# Patient Record
Sex: Female | Born: 1945 | Race: Black or African American | Hispanic: No | Marital: Married | Smoking: Never smoker
Health system: Southern US, Community
[De-identification: ages and names within clinical notes are randomized; demographics above are authoritative.]

## PROBLEM LIST (undated history)

## (undated) DIAGNOSIS — I10 Essential (primary) hypertension: Secondary | ICD-10-CM

## (undated) DIAGNOSIS — D649 Anemia, unspecified: Secondary | ICD-10-CM

## (undated) DIAGNOSIS — E669 Obesity, unspecified: Secondary | ICD-10-CM

## (undated) DIAGNOSIS — E119 Type 2 diabetes mellitus without complications: Secondary | ICD-10-CM

## (undated) DIAGNOSIS — M1990 Unspecified osteoarthritis, unspecified site: Secondary | ICD-10-CM

## (undated) DIAGNOSIS — E785 Hyperlipidemia, unspecified: Secondary | ICD-10-CM

## (undated) HISTORY — DX: Unspecified osteoarthritis, unspecified site: M19.90

## (undated) HISTORY — DX: Obesity, unspecified: E66.9

## (undated) HISTORY — DX: Essential (primary) hypertension: I10

## (undated) HISTORY — DX: Type 2 diabetes mellitus without complications: E11.9

## (undated) HISTORY — DX: Hyperlipidemia, unspecified: E78.5

## (undated) HISTORY — PX: ABDOMINAL HYSTERECTOMY: SHX81

---

## 1983-03-13 HISTORY — PX: ABDOMINAL HYSTERECTOMY: SHX81

## 2003-01-13 ENCOUNTER — Encounter: Admission: RE | Admit: 2003-01-13 | Discharge: 2003-01-13 | Payer: Self-pay | Admitting: Internal Medicine

## 2003-12-28 ENCOUNTER — Ambulatory Visit: Payer: Self-pay | Admitting: Internal Medicine

## 2005-02-28 ENCOUNTER — Ambulatory Visit: Payer: Self-pay | Admitting: Internal Medicine

## 2005-05-10 ENCOUNTER — Ambulatory Visit: Payer: Self-pay | Admitting: Internal Medicine

## 2005-08-30 ENCOUNTER — Ambulatory Visit: Payer: Self-pay | Admitting: Internal Medicine

## 2005-09-05 ENCOUNTER — Ambulatory Visit: Payer: Self-pay | Admitting: Internal Medicine

## 2005-12-17 ENCOUNTER — Ambulatory Visit: Payer: Self-pay | Admitting: Internal Medicine

## 2006-04-01 ENCOUNTER — Ambulatory Visit: Payer: Self-pay | Admitting: Internal Medicine

## 2006-04-01 LAB — CONVERTED CEMR LAB
ALT: 18 units/L (ref 0–40)
AST: 12 units/L (ref 0–37)
Cholesterol: 194 mg/dL (ref 0–200)
Creatinine, Ser: 0.7 mg/dL (ref 0.4–1.2)
HDL: 47.8 mg/dL (ref >39.0)
Hgb A1c MFr Bld: 9.4 % — ABNORMAL HIGH (ref 4.6–6.0)
LDL Cholesterol: 134 mg/dL — ABNORMAL HIGH (ref 0–99)
Potassium: 4.2 meq/L (ref 3.5–5.1)
Total CHOL/HDL Ratio: 4.1
Triglycerides: 60 mg/dL (ref 0–149)
VLDL: 12 mg/dL (ref 0–40)

## 2006-04-17 ENCOUNTER — Other Ambulatory Visit: Admission: RE | Admit: 2006-04-17 | Discharge: 2006-04-17 | Payer: Self-pay | Admitting: Gynecology

## 2006-05-10 ENCOUNTER — Ambulatory Visit: Payer: Self-pay | Admitting: Internal Medicine

## 2006-07-25 ENCOUNTER — Ambulatory Visit: Payer: Self-pay | Admitting: Gastroenterology

## 2006-08-07 ENCOUNTER — Ambulatory Visit: Payer: Self-pay | Admitting: Internal Medicine

## 2006-08-07 ENCOUNTER — Ambulatory Visit: Payer: Self-pay | Admitting: Gastroenterology

## 2006-08-07 LAB — CONVERTED CEMR LAB
ALT: 18 units/L (ref 0–40)
AST: 14 units/L (ref 0–37)
Albumin: 3.7 g/dL (ref 3.5–5.2)
Alkaline Phosphatase: 82 units/L (ref 39–117)
BUN: 10 mg/dL (ref 6–23)
Bilirubin, Direct: 0.1 mg/dL (ref 0.0–0.3)
CO2: 28 meq/L (ref 19–32)
Calcium: 9.3 mg/dL (ref 8.4–10.5)
Chloride: 103 meq/L (ref 96–112)
Cholesterol: 235 mg/dL (ref 0–200)
Creatinine, Ser: 0.7 mg/dL (ref 0.4–1.2)
Direct LDL: 162.1 mg/dL
GFR calc Af Amer: 110 mL/min
GFR calc non Af Amer: 91 mL/min
Glucose, Bld: 142 mg/dL — ABNORMAL HIGH (ref 70–99)
HDL: 46.4 mg/dL (ref >39.0)
Hgb A1c MFr Bld: 10.5 % — ABNORMAL HIGH (ref 4.6–6.0)
Potassium: 3.9 meq/L (ref 3.5–5.1)
Sodium: 139 meq/L (ref 135–145)
Total Bilirubin: 0.8 mg/dL (ref 0.3–1.2)
Total CHOL/HDL Ratio: 5.1
Total Protein: 7.5 g/dL (ref 6.0–8.3)
Triglycerides: 78 mg/dL (ref 0–149)
VLDL: 16 mg/dL (ref 0–40)
Vit D, 1,25-Dihydroxy: 10 — ABNORMAL LOW (ref 20–57)

## 2006-08-07 LAB — HM COLONOSCOPY: HM Colonoscopy: NORMAL

## 2006-08-09 ENCOUNTER — Ambulatory Visit: Payer: Self-pay | Admitting: Internal Medicine

## 2006-11-26 DIAGNOSIS — I10 Essential (primary) hypertension: Secondary | ICD-10-CM | POA: Insufficient documentation

## 2006-11-26 DIAGNOSIS — E785 Hyperlipidemia, unspecified: Secondary | ICD-10-CM

## 2006-11-26 DIAGNOSIS — E1169 Type 2 diabetes mellitus with other specified complication: Secondary | ICD-10-CM | POA: Insufficient documentation

## 2006-12-10 ENCOUNTER — Ambulatory Visit: Payer: Self-pay | Admitting: Internal Medicine

## 2006-12-10 LAB — CONVERTED CEMR LAB
ALT: 18 units/L (ref 0–35)
AST: 11 units/L (ref 0–37)
Albumin: 3.4 g/dL — ABNORMAL LOW (ref 3.5–5.2)
Alkaline Phosphatase: 80 units/L (ref 39–117)
BUN: 10 mg/dL (ref 6–23)
Bilirubin, Direct: 0.1 mg/dL (ref 0.0–0.3)
CO2: 29 meq/L (ref 19–32)
Calcium: 9.3 mg/dL (ref 8.4–10.5)
Chloride: 105 meq/L (ref 96–112)
Cholesterol: 202 mg/dL (ref 0–200)
Creatinine, Ser: 0.6 mg/dL (ref 0.4–1.2)
Direct LDL: 147.6 mg/dL
GFR calc Af Amer: 131 mL/min
GFR calc non Af Amer: 108 mL/min
Glucose, Bld: 177 mg/dL — ABNORMAL HIGH (ref 70–99)
HDL: 43 mg/dL (ref >39.0)
Hgb A1c MFr Bld: 8.9 % — ABNORMAL HIGH (ref 4.6–6.0)
Potassium: 4.4 meq/L (ref 3.5–5.1)
Sodium: 141 meq/L (ref 135–145)
Total Bilirubin: 0.6 mg/dL (ref 0.3–1.2)
Total CHOL/HDL Ratio: 4.7
Total Protein: 7 g/dL (ref 6.0–8.3)
Triglycerides: 81 mg/dL (ref 0–149)
VLDL: 16 mg/dL (ref 0–40)
Vit D, 1,25-Dihydroxy: 17 — ABNORMAL LOW (ref 20–57)

## 2006-12-12 ENCOUNTER — Ambulatory Visit: Payer: Self-pay | Admitting: Internal Medicine

## 2006-12-19 ENCOUNTER — Encounter: Payer: Self-pay | Admitting: Internal Medicine

## 2007-01-14 ENCOUNTER — Telehealth: Payer: Self-pay | Admitting: Internal Medicine

## 2007-01-14 ENCOUNTER — Ambulatory Visit: Payer: Self-pay | Admitting: Internal Medicine

## 2007-04-09 ENCOUNTER — Ambulatory Visit: Payer: Self-pay | Admitting: Internal Medicine

## 2007-04-09 LAB — CONVERTED CEMR LAB
ALT: 18 units/L (ref 0–35)
AST: 12 units/L (ref 0–37)
Albumin: 3.6 g/dL (ref 3.5–5.2)
Alkaline Phosphatase: 73 units/L (ref 39–117)
BUN: 9 mg/dL (ref 6–23)
Bilirubin, Direct: 0.1 mg/dL (ref 0.0–0.3)
CO2: 29 meq/L (ref 19–32)
Calcium: 9.5 mg/dL (ref 8.4–10.5)
Chloride: 106 meq/L (ref 96–112)
Cholesterol: 207 mg/dL (ref 0–200)
Creatinine, Ser: 0.7 mg/dL (ref 0.4–1.2)
Direct LDL: 135.3 mg/dL
GFR calc Af Amer: 109 mL/min
GFR calc non Af Amer: 90 mL/min
Glucose, Bld: 159 mg/dL — ABNORMAL HIGH (ref 70–99)
HDL: 44.8 mg/dL (ref >39.0)
Hgb A1c MFr Bld: 9.8 % — ABNORMAL HIGH (ref 4.6–6.0)
Potassium: 4.6 meq/L (ref 3.5–5.1)
Sodium: 141 meq/L (ref 135–145)
Total Bilirubin: 0.6 mg/dL (ref 0.3–1.2)
Total CHOL/HDL Ratio: 4.6
Total Protein: 6.9 g/dL (ref 6.0–8.3)
Triglycerides: 84 mg/dL (ref 0–149)
VLDL: 17 mg/dL (ref 0–40)

## 2007-04-11 ENCOUNTER — Ambulatory Visit: Payer: Self-pay | Admitting: Internal Medicine

## 2007-04-11 DIAGNOSIS — E669 Obesity, unspecified: Secondary | ICD-10-CM | POA: Insufficient documentation

## 2007-04-11 DIAGNOSIS — E113599 Type 2 diabetes mellitus with proliferative diabetic retinopathy without macular edema, unspecified eye: Secondary | ICD-10-CM | POA: Insufficient documentation

## 2007-04-21 ENCOUNTER — Other Ambulatory Visit: Admission: RE | Admit: 2007-04-21 | Discharge: 2007-04-21 | Payer: Self-pay | Admitting: Gynecology

## 2007-06-18 ENCOUNTER — Encounter: Payer: Self-pay | Admitting: Internal Medicine

## 2007-09-01 ENCOUNTER — Ambulatory Visit: Payer: Self-pay | Admitting: Internal Medicine

## 2007-09-02 LAB — CONVERTED CEMR LAB
BUN: 12 mg/dL (ref 6–23)
CO2: 28 meq/L (ref 19–32)
Calcium: 8.9 mg/dL (ref 8.4–10.5)
Chloride: 105 meq/L (ref 96–112)
Cholesterol: 204 mg/dL (ref 0–200)
Creatinine, Ser: 0.7 mg/dL (ref 0.4–1.2)
Direct LDL: 152 mg/dL
GFR calc Af Amer: 109 mL/min
GFR calc non Af Amer: 90 mL/min
Glucose, Bld: 128 mg/dL — ABNORMAL HIGH (ref 70–99)
HDL: 44.3 mg/dL (ref >39.0)
Hgb A1c MFr Bld: 7.6 % — ABNORMAL HIGH (ref 4.6–6.0)
Potassium: 4.3 meq/L (ref 3.5–5.1)
Sodium: 141 meq/L (ref 135–145)
TSH: 3.08 microintl units/mL (ref 0.35–5.50)
Total CHOL/HDL Ratio: 4.6
Triglycerides: 52 mg/dL (ref 0–149)
VLDL: 10 mg/dL (ref 0–40)

## 2007-09-03 ENCOUNTER — Ambulatory Visit: Payer: Self-pay | Admitting: Internal Medicine

## 2007-09-03 DIAGNOSIS — R252 Cramp and spasm: Secondary | ICD-10-CM | POA: Insufficient documentation

## 2008-02-26 ENCOUNTER — Encounter: Payer: Self-pay | Admitting: Internal Medicine

## 2008-02-26 ENCOUNTER — Ambulatory Visit: Payer: Self-pay | Admitting: Internal Medicine

## 2008-03-02 LAB — CONVERTED CEMR LAB
ALT: 15 units/L (ref 0–35)
AST: 12 units/L (ref 0–37)
Albumin: 3.5 g/dL (ref 3.5–5.2)
Alkaline Phosphatase: 74 units/L (ref 39–117)
BUN: 8 mg/dL (ref 6–23)
Bilirubin, Direct: 0.1 mg/dL (ref 0.0–0.3)
CO2: 30 meq/L (ref 19–32)
Calcium: 9.2 mg/dL (ref 8.4–10.5)
Chloride: 108 meq/L (ref 96–112)
Creatinine, Ser: 0.6 mg/dL (ref 0.4–1.2)
GFR calc Af Amer: 131 mL/min
GFR calc non Af Amer: 108 mL/min
Glucose, Bld: 116 mg/dL — ABNORMAL HIGH (ref 70–99)
Hgb A1c MFr Bld: 7.7 % — ABNORMAL HIGH (ref 4.6–6.0)
Potassium: 4.9 meq/L (ref 3.5–5.1)
Sodium: 142 meq/L (ref 135–145)
Total Bilirubin: 0.5 mg/dL (ref 0.3–1.2)
Total Protein: 7.2 g/dL (ref 6.0–8.3)
Vit D, 1,25-Dihydroxy: 13 — ABNORMAL LOW (ref 30–89)

## 2008-03-04 ENCOUNTER — Ambulatory Visit: Payer: Self-pay | Admitting: Internal Medicine

## 2008-03-04 DIAGNOSIS — M25569 Pain in unspecified knee: Secondary | ICD-10-CM | POA: Insufficient documentation

## 2008-04-21 ENCOUNTER — Ambulatory Visit: Payer: Self-pay | Admitting: Gynecology

## 2008-04-21 ENCOUNTER — Other Ambulatory Visit: Admission: RE | Admit: 2008-04-21 | Discharge: 2008-04-21 | Payer: Self-pay | Admitting: Gynecology

## 2008-04-21 ENCOUNTER — Encounter: Payer: Self-pay | Admitting: Gynecology

## 2008-05-03 ENCOUNTER — Ambulatory Visit: Payer: Self-pay | Admitting: Gynecology

## 2008-06-17 ENCOUNTER — Ambulatory Visit: Payer: Self-pay | Admitting: Internal Medicine

## 2008-06-17 LAB — CONVERTED CEMR LAB
BUN: 12 mg/dL (ref 6–23)
CO2: 29 meq/L (ref 19–32)
Calcium: 9 mg/dL (ref 8.4–10.5)
Chloride: 109 meq/L (ref 96–112)
Creatinine, Ser: 0.6 mg/dL (ref 0.4–1.2)
GFR calc non Af Amer: 130.16 mL/min (ref >60)
Glucose, Bld: 117 mg/dL — ABNORMAL HIGH (ref 70–99)
Hgb A1c MFr Bld: 7.8 % — ABNORMAL HIGH (ref 4.6–6.5)
Potassium: 4.4 meq/L (ref 3.5–5.1)
Sodium: 143 meq/L (ref 135–145)

## 2008-06-21 ENCOUNTER — Ambulatory Visit: Payer: Self-pay | Admitting: Internal Medicine

## 2008-10-12 ENCOUNTER — Ambulatory Visit: Payer: Self-pay | Admitting: Internal Medicine

## 2008-10-12 LAB — CONVERTED CEMR LAB
ALT: 14 units/L (ref 0–35)
AST: 11 units/L (ref 0–37)
Albumin: 3.5 g/dL (ref 3.5–5.2)
Alkaline Phosphatase: 81 units/L (ref 39–117)
BUN: 15 mg/dL (ref 6–23)
Bilirubin, Direct: 0.1 mg/dL (ref 0.0–0.3)
CO2: 29 meq/L (ref 19–32)
Calcium: 8.9 mg/dL (ref 8.4–10.5)
Chloride: 109 meq/L (ref 96–112)
Cholesterol: 199 mg/dL (ref 0–200)
Creatinine, Ser: 0.8 mg/dL (ref 0.4–1.2)
GFR calc non Af Amer: 93.3 mL/min (ref >60)
Glucose, Bld: 148 mg/dL — ABNORMAL HIGH (ref 70–99)
HDL: 40.6 mg/dL (ref >39.00)
Hgb A1c MFr Bld: 8.7 % — ABNORMAL HIGH (ref 4.6–6.5)
LDL Cholesterol: 146 mg/dL — ABNORMAL HIGH (ref 0–99)
Potassium: 4.4 meq/L (ref 3.5–5.1)
Sodium: 142 meq/L (ref 135–145)
Total Bilirubin: 0.5 mg/dL (ref 0.3–1.2)
Total CHOL/HDL Ratio: 5
Total Protein: 7.2 g/dL (ref 6.0–8.3)
Triglycerides: 61 mg/dL (ref 0.0–149.0)
VLDL: 12.2 mg/dL (ref 0.0–40.0)

## 2008-10-20 ENCOUNTER — Ambulatory Visit: Payer: Self-pay | Admitting: Internal Medicine

## 2008-11-09 ENCOUNTER — Encounter: Payer: Self-pay | Admitting: Internal Medicine

## 2009-02-11 ENCOUNTER — Ambulatory Visit: Payer: Self-pay | Admitting: Internal Medicine

## 2009-02-15 LAB — CONVERTED CEMR LAB
BUN: 10 mg/dL (ref 6–23)
CO2: 28 meq/L (ref 19–32)
Calcium: 8.9 mg/dL (ref 8.4–10.5)
Chloride: 106 meq/L (ref 96–112)
Creatinine, Ser: 0.8 mg/dL (ref 0.4–1.2)
GFR calc non Af Amer: 93.19 mL/min (ref >60)
Glucose, Bld: 166 mg/dL — ABNORMAL HIGH (ref 70–99)
Hgb A1c MFr Bld: 8.8 % — ABNORMAL HIGH (ref 4.6–6.5)
Potassium: 4.8 meq/L (ref 3.5–5.1)
Sodium: 140 meq/L (ref 135–145)

## 2009-02-17 ENCOUNTER — Ambulatory Visit: Payer: Self-pay | Admitting: Internal Medicine

## 2009-03-10 ENCOUNTER — Encounter: Payer: Self-pay | Admitting: Internal Medicine

## 2009-04-22 ENCOUNTER — Ambulatory Visit: Payer: Self-pay | Admitting: Gynecology

## 2009-04-22 ENCOUNTER — Other Ambulatory Visit: Admission: RE | Admit: 2009-04-22 | Discharge: 2009-04-22 | Payer: Self-pay | Admitting: Gynecology

## 2009-05-26 ENCOUNTER — Telehealth: Payer: Self-pay | Admitting: Internal Medicine

## 2009-05-31 ENCOUNTER — Ambulatory Visit: Payer: Self-pay | Admitting: Internal Medicine

## 2009-05-31 LAB — CONVERTED CEMR LAB
ALT: 16 units/L (ref 0–35)
AST: 12 units/L (ref 0–37)
Albumin: 3.5 g/dL (ref 3.5–5.2)
Alkaline Phosphatase: 59 units/L (ref 39–117)
BUN: 14 mg/dL (ref 6–23)
Bilirubin, Direct: 0 mg/dL (ref 0.0–0.3)
CO2: 25 meq/L (ref 19–32)
Calcium: 9 mg/dL (ref 8.4–10.5)
Chloride: 108 meq/L (ref 96–112)
Creatinine, Ser: 0.8 mg/dL (ref 0.4–1.2)
GFR calc non Af Amer: 93.1 mL/min (ref >60)
Glucose, Bld: 66 mg/dL — ABNORMAL LOW (ref 70–99)
Hgb A1c MFr Bld: 7.3 % — ABNORMAL HIGH (ref 4.6–6.5)
Potassium: 4.3 meq/L (ref 3.5–5.1)
Sodium: 140 meq/L (ref 135–145)
Total Bilirubin: 0.4 mg/dL (ref 0.3–1.2)
Total Protein: 7.1 g/dL (ref 6.0–8.3)

## 2009-06-07 ENCOUNTER — Ambulatory Visit: Payer: Self-pay | Admitting: Internal Medicine

## 2009-07-05 ENCOUNTER — Telehealth: Payer: Self-pay | Admitting: Internal Medicine

## 2009-08-09 ENCOUNTER — Telehealth: Payer: Self-pay | Admitting: Internal Medicine

## 2009-08-11 ENCOUNTER — Telehealth: Payer: Self-pay | Admitting: Internal Medicine

## 2009-09-30 ENCOUNTER — Ambulatory Visit: Payer: Self-pay | Admitting: Internal Medicine

## 2009-09-30 LAB — CONVERTED CEMR LAB
BUN: 21 mg/dL (ref 6–23)
CO2: 26 meq/L (ref 19–32)
Calcium: 9.2 mg/dL (ref 8.4–10.5)
Chloride: 110 meq/L (ref 96–112)
Creatinine, Ser: 0.9 mg/dL (ref 0.4–1.2)
GFR calc non Af Amer: 81.19 mL/min (ref >60)
Glucose, Bld: 90 mg/dL (ref 70–99)
Hgb A1c MFr Bld: 6.8 % — ABNORMAL HIGH (ref 4.6–6.5)
Potassium: 5 meq/L (ref 3.5–5.1)
Sodium: 142 meq/L (ref 135–145)

## 2009-10-07 ENCOUNTER — Ambulatory Visit: Payer: Self-pay | Admitting: Internal Medicine

## 2010-01-30 ENCOUNTER — Ambulatory Visit: Payer: Self-pay | Admitting: Internal Medicine

## 2010-03-01 ENCOUNTER — Ambulatory Visit: Payer: Self-pay | Admitting: Internal Medicine

## 2010-03-01 LAB — CONVERTED CEMR LAB
Bilirubin Urine: NEGATIVE
Blood in Urine, dipstick: NEGATIVE
Glucose, Urine, Semiquant: NEGATIVE
Ketones, urine, test strip: NEGATIVE
Nitrite: NEGATIVE
Protein, U semiquant: NEGATIVE
Specific Gravity, Urine: 1.02
Urobilinogen, UA: 0.2
WBC Urine, dipstick: NEGATIVE
pH: 7

## 2010-03-09 ENCOUNTER — Ambulatory Visit
Admission: RE | Admit: 2010-03-09 | Discharge: 2010-03-09 | Payer: Self-pay | Source: Home / Self Care | Attending: Internal Medicine | Admitting: Internal Medicine

## 2010-03-09 ENCOUNTER — Encounter: Payer: Self-pay | Admitting: Internal Medicine

## 2010-03-09 DIAGNOSIS — R143 Flatulence: Secondary | ICD-10-CM

## 2010-03-09 DIAGNOSIS — R142 Eructation: Secondary | ICD-10-CM

## 2010-03-09 DIAGNOSIS — R141 Gas pain: Secondary | ICD-10-CM | POA: Insufficient documentation

## 2010-03-10 LAB — CONVERTED CEMR LAB
ALT: 18 units/L (ref 0–35)
AST: 12 units/L (ref 0–37)
Albumin: 3.7 g/dL (ref 3.5–5.2)
Alkaline Phosphatase: 87 units/L (ref 39–117)
BUN: 18 mg/dL (ref 6–23)
Basophils Absolute: 0 10*3/uL (ref 0.0–0.1)
Basophils Relative: 0.4 % (ref 0.0–3.0)
Bilirubin, Direct: 0.1 mg/dL (ref 0.0–0.3)
CO2: 27 meq/L (ref 19–32)
Calcium: 9.3 mg/dL (ref 8.4–10.5)
Chloride: 108 meq/L (ref 96–112)
Cholesterol: 225 mg/dL — ABNORMAL HIGH (ref 0–200)
Creatinine, Ser: 0.9 mg/dL (ref 0.4–1.2)
Direct LDL: 165.6 mg/dL
Eosinophils Absolute: 0 10*3/uL (ref 0.0–0.7)
Eosinophils Relative: 0.8 % (ref 0.0–5.0)
GFR calc non Af Amer: 82.13 mL/min (ref >60.00)
Glucose, Bld: 106 mg/dL — ABNORMAL HIGH (ref 70–99)
HCT: 33.1 % — ABNORMAL LOW (ref 36.0–46.0)
HDL: 52.9 mg/dL (ref >39.00)
Hemoglobin: 11.1 g/dL — ABNORMAL LOW (ref 12.0–15.0)
Hgb A1c MFr Bld: 7 % — ABNORMAL HIGH (ref 4.6–6.5)
Lymphocytes Relative: 25.9 % (ref 12.0–46.0)
Lymphs Abs: 1.6 10*3/uL (ref 0.7–4.0)
MCHC: 33.7 g/dL (ref 30.0–36.0)
MCV: 90.8 fL (ref 78.0–100.0)
Monocytes Absolute: 0.4 10*3/uL (ref 0.1–1.0)
Monocytes Relative: 6 % (ref 3.0–12.0)
Neutro Abs: 4.1 10*3/uL (ref 1.4–7.7)
Neutrophils Relative %: 66.9 % (ref 43.0–77.0)
Platelets: 244 10*3/uL (ref 150.0–400.0)
Potassium: 5.3 meq/L — ABNORMAL HIGH (ref 3.5–5.1)
RBC: 3.64 M/uL — ABNORMAL LOW (ref 3.87–5.11)
RDW: 13.8 % (ref 11.5–14.6)
Sodium: 142 meq/L (ref 135–145)
TSH: 2.31 microintl units/mL (ref 0.35–5.50)
Total Bilirubin: 0.4 mg/dL (ref 0.3–1.2)
Total CHOL/HDL Ratio: 4
Total Protein: 7.3 g/dL (ref 6.0–8.3)
Triglycerides: 37 mg/dL (ref 0.0–149.0)
VLDL: 7.4 mg/dL (ref 0.0–40.0)
WBC: 6.2 10*3/uL (ref 4.5–10.5)

## 2010-03-14 ENCOUNTER — Telehealth: Payer: Self-pay | Admitting: Internal Medicine

## 2010-03-14 ENCOUNTER — Encounter: Payer: Self-pay | Admitting: Internal Medicine

## 2010-03-28 ENCOUNTER — Encounter
Admission: RE | Admit: 2010-03-28 | Discharge: 2010-03-28 | Payer: Self-pay | Source: Home / Self Care | Attending: Internal Medicine | Admitting: Internal Medicine

## 2010-03-28 ENCOUNTER — Telehealth: Payer: Self-pay | Admitting: Internal Medicine

## 2010-03-29 ENCOUNTER — Telehealth: Payer: Self-pay | Admitting: Internal Medicine

## 2010-03-31 ENCOUNTER — Encounter: Payer: Self-pay | Admitting: Internal Medicine

## 2010-04-01 ENCOUNTER — Encounter: Payer: Self-pay | Admitting: Internal Medicine

## 2010-04-11 NOTE — Progress Notes (Signed)
Summary: Capsules  Phone Note From Pharmacy   Summary of Call: Pharm requested capsules in place of tablets for pt's verapamil. Gave verbal ok to pharm and updated EMR.  Initial call taken by: Lamar Sprinkles, CMA,  August 11, 2009 1:44 PM  Follow-up for Phone Call        agree - Thx Follow-up by: Tresa Garter MD,  August 11, 2009 10:46 PM    New/Updated Medications: VERAPAMIL HCL CR 240 MG XR24H-CAP (VERAPAMIL HCL) once daily Prescriptions: VERAPAMIL HCL CR 240 MG XR24H-CAP (VERAPAMIL HCL) once daily  #90 x 2   Entered by:   Lamar Sprinkles, CMA   Authorized by:   Tresa Garter MD   Signed by:   Lamar Sprinkles, CMA on 08/11/2009   Method used:   Telephoned to ...       [facility]* (retail)       [address]       [address] Shipping/mailing       [place], [place]  [address]       Ph: [phone]       Fax: [phone]   RxID:   [id number]

## 2010-04-11 NOTE — Assessment & Plan Note (Signed)
Summary: 3 mo rov /nws   Vital Signs:  Patient profile:   65 year old female Weight:      245 pounds BMI:     42.21 Temp:     98.3 degrees F oral Pulse rate:   93 / minute BP sitting:   146 / 80  (left arm)  Vitals Entered By: Tora Perches (June 07, 2009 8:06 AM) CC: f/u Is Patient Diabetic? Yes   CC:  f/u.  History of Present Illness: The patient presents for a follow up of hypertension, diabetes, hyperlipidemia. She has put wt on.  Preventive Screening-Counseling & Management  Alcohol-Tobacco     Smoking Status: never  Current Medications (verified): 1)  Verapamil Hcl Cr 240 Mg  Tbcr (Verapamil Hcl) .... Once Daily 2)  Amaryl 2 Mg  Tabs (Glimepiride) .... Two Times A Day 3)  Lovastatin 20 Mg  Tabs (Lovastatin) .... Once Daily 4)  Janumet 50-1000 Mg  Tabs (Sitagliptin-Metformin Hcl) .[name].. 1 By Mouth Bid 5)  Accupril 40 Mg  Tabs (Quinapril Hcl) .[name].. 1 By Mouth Qd 6)  Spironolactone 50 Mg  Tabs (Spironolactone) .[name].. 1 By Mouth Qd 7)  Aspir-Low 81 Mg Tbec (Aspirin) .[name].. 1 Once Daily After Meal 8)  Vitamin D3 1000 Unit  Tabs (Cholecalciferol) .[name].. 1 Qd 9)  Actos 30 Mg Tabs (Pioglitazone Hcl) .[name].. 1 By Mouth Qd  Allergies: 1)  Hydrochlorothiazide  Past History:  Past Medical History: Last updated: 10/20/2008 Hyperlipidemia Hypertension Diabetes mellitus, type II Obesity  Social History: Last updated: 04/11/2007 Occupation: [employer] Never Smoked Married  Physical Exam  General:  overweight-appearing.   Nose:  External nasal examination shows no deformity or inflammation. Nasal mucosa are pink and moist without lesions or exudates. Mouth:  Oral mucosa and oropharynx without lesions or exudates.  Teeth in good repair. Lungs:  Normal respiratory effort, chest expands symmetrically. Lungs are clear to auscultation, no crackles or wheezes. Heart:  Normal rate and regular rhythm. S1 and S2 normal without gallop, murmur, click, rub or other extra  sounds. Abdomen:  Bowel sounds positive,abdomen soft and non-tender without masses, organomegaly or hernias noted. Msk:  No deformity or scoliosis noted of thoracic or lumbar spine.   Extremities:  No edema Neurologic:  No cranial nerve deficits noted. Station and gait are normal. Plantar reflexes are down-going bilaterally. DTRs are symmetrical throughout. Sensory, motor and coordinative functions appear intact. Skin:  Intact without suspicious lesions or rashes Psych:  Cognition and judgment appear intact. Alert and cooperative with normal attention span and concentration. No apparent delusions, illusions, hallucinations   Impression & Recommendations:  Problem # 1:  OBESITY (ICD-278.00) Assessment Deteriorated Lap band adviced  Problem # 2:  HYPERTENSION (ICD-401.9) Assessment: Unchanged  Her updated medication list for this problem includes:    Verapamil Hcl Cr 240 Mg Tbcr (Verapamil hcl) ..... Once daily    Accupril 40 Mg Tabs (Quinapril hcl) .[name]... 1 by mouth qd    Spironolactone 50 Mg Tabs (Spironolactone) .[name]... 1 by mouth qd  Problem # 3:  DIABETES MELLITUS, TYPE II (ICD-250.00) Assessment: Improved  Her updated medication list for this problem includes:    Amaryl 2 Mg Tabs (Glimepiride) .[name]..[name] Two times a day    Janumet 50-1000 Mg Tabs (Sitagliptin-metformin hcl) .[name]... 1 by mouth bid    Accupril 40 Mg Tabs (Quinapril hcl) .[name]... 1 by mouth qd    Aspir-low 81 Mg Tbec (Aspirin) .[name]... 1 once daily after meal    Actos 30 Mg  Tabs (Pioglitazone hcl) .[name]... 1 by mouth qd  Complete Medication List: 1)  Verapamil Hcl Cr 240 Mg Tbcr (Verapamil hcl) .... Once daily 2)  Amaryl 2 Mg Tabs (Glimepiride) .... Two times a day 3)  Lovastatin 20 Mg Tabs (Lovastatin) .... Once daily 4)  Janumet 50-1000 Mg Tabs (Sitagliptin-metformin hcl) .[name].. 1 by mouth bid 5)  Accupril 40 Mg Tabs (Quinapril hcl) .[name].. 1 by mouth qd 6)  Spironolactone 50 Mg Tabs (Spironolactone) .[name].. 1 by mouth qd 7)   Aspir-low 81 Mg Tbec (Aspirin) .[name].. 1 once daily after meal 8)  Vitamin D3 1000 Unit Tabs (Cholecalciferol) .[name].. 1 qd 9)  Actos 30 Mg Tabs (Pioglitazone hcl) .[name].. 1 by mouth qd  Patient Instructions: 1)  Please schedule a follow-up appointment in 4 months. 2)  BMP prior to visit, ICD-9: 3)  HbgA1C prior to visit, ICD-9:250.00 4)  Loose wt! 5)  [email or web address]

## 2010-04-11 NOTE — Progress Notes (Signed)
Summary: Geologist, engineering  Phone Note Call from Patient   Caller: Patient Call For: Katelyn Peterson Summary of Call: pt called---- states she requested a refill on her bp med: verapamil 240mg ,  she said she was told by her pharmacy that her blood pressure med is not available at this time,  she state she has been waiting and has not taken her med in 2 weeks-----pt asked that she be given a med that she can get that is available in the office if at all possible.   Please advise.   Initial call taken by: Tora Perches,  May 26, 2009 2:17 PM  Follow-up for Phone Call        please ask  the pharmacy to substitute it to the closest match asap Follow-up by: Katelyn Peterson,  May 26, 2009 5:45 PM  Additional Follow-up for Phone Call Additional follow up Details #1::        By our records patient would not have any refills remaining. Will send in refill now and call pharm later today when they open to verify. Additional Follow-up by: Lamar Sprinkles, CMA,  May 27, 2009 8:24 AM    Additional Follow-up for Phone Call Additional follow up Details #2::    pt rtc and was f/u on message---I called the pharmacy and was informed that the med was  in now and ready for the pt to pick up--pt is aware./vg Follow-up by: Tora Perches,  May 27, 2009 3:39 PM  Prescriptions: VERAPAMIL HCL CR 240 MG  TBCR (VERAPAMIL HCL) once daily  #30 x 6   Entered by:   Lamar Sprinkles, CMA   Authorized by:   Katelyn Peterson   Signed by:   Lamar Sprinkles, CMA on 05/27/2009   Method used:   Electronically to        [facility] [address]* (retail)       [address] [place], [place]  [address]       Ph: [phone] or [phone]       Fax: [phone]   RxID:   [id number]

## 2010-04-11 NOTE — Progress Notes (Signed)
Summary: Refill--Verapamil  Phone Note Refill Request Message from:  Fax from Pharmacy on Aug 09, 2009 11:46 AM  Refills Requested: Medication #1:  VERAPAMIL HCL CR 240 MG  TBCR once daily   Notes: [facility] Initial call taken by: Lucious Groves,  Aug 09, 2009 11:46 AM    Prescriptions: VERAPAMIL HCL CR 240 MG  TBCR (VERAPAMIL HCL) once daily  #90 x 2   Entered by:   Lucious Groves   Authorized by:   Tresa Garter MD   Signed by:   Lucious Groves on 08/09/2009   Method used:   Electronically to        [facility]* (retail)       [address]       [address] Shipping/mailing       [place], [place]  [address]       Ph: [phone]       Fax: [phone]   RxID:   [id number]

## 2010-04-11 NOTE — Progress Notes (Signed)
Summary: 90 day supply  Phone Note Refill Request Message from:  Fax from Pharmacy on July 05, 2009 2:58 PM  Refills Requested: Medication #1:  SPIRONOLACTONE 50 MG  TABS 1 by mouth qd   Dosage confirmed as above?Dosage Confirmed  Medication #2:  AMARYL 2 MG  TABS two times a day   Dosage confirmed as above?Dosage Confirmed  Medication #3:  ACCUPRIL 40 MG  TABS 1 by mouth qd   Dosage confirmed as above?Dosage Confirmed  Method Requested: Fax to Local Pharmacy Next Appointment Scheduled: 10/07/2009 Initial call taken by: Brenton Grills,  July 05, 2009 3:00 PM  Follow-up for Phone Call        Fax to [facility]  Follow-up by: Brenton Grills,  July 05, 2009 3:00 PM    Prescriptions: SPIRONOLACTONE 50 MG  TABS (SPIRONOLACTONE) 1 by mouth qd  #90 x 3   Entered by:   Lucious Groves   Authorized by:   Tresa Garter MD   Signed by:   Lucious Groves on 07/05/2009   Method used:   Faxed to ...       [facility]* (retail)       [address]       [address] Shipping/mailing       [place], [place]  [address]       Ph: [phone]       Fax: [phone]   RxID:   [id number] ACCUPRIL 40 MG  TABS (QUINAPRIL HCL) 1 by mouth qd  #90 x 3   Entered by:   Lucious Groves   Authorized by:   Tresa Garter MD   Signed by:   Lucious Groves on 07/05/2009   Method used:   Faxed to ...       [facility]* (retail)       [address]       [address] Shipping/mailing       [place], [place]  [address]       Ph: [phone]       Fax: [phone]   RxID:   [id number] AMARYL 2 MG  TABS (GLIMEPIRIDE) two times a day  #90 x 3   Entered by:   Lucious Groves   Authorized by:   Tresa Garter MD   Signed by:   Lucious Groves on 07/05/2009   Method used:   Faxed to ...       [facility]* (retail)       [address]       [address] Shipping/mailing       [place], [place]  [address]       Ph: [phone]   Fax: [phone]   RxID:   [id number]

## 2010-04-11 NOTE — Assessment & Plan Note (Signed)
Summary: 4 MO ROV /NWS   Vital Signs:  Patient profile:   65 year old female Height:      64 inches Weight:      251 pounds BMI:     43.24 O2 Sat:      96 % on Room air Temp:     98.2 degrees F oral Pulse rate:   88 / minute Pulse rhythm:   regular Resp:     16 per minute BP sitting:   148 / 80  (left arm) Cuff size:   large  Vitals Entered By: Lanier Prude, CMA(AAMA) (October 07, 2009 7:50 AM)  O2 Flow:  Room air CC: 4 mo f/u Is Patient Diabetic? Yes   CC:  4 mo f/u.  History of Present Illness: The patient presents for a follow up of hypertension, diabetes, hyperlipidemia   Current Medications (verified): 1)  Verapamil Hcl Cr 240 Mg Xr24h-Cap (Verapamil Hcl) .... Once Daily 2)  Amaryl 2 Mg  Tabs (Glimepiride) .... Two Times A Day 3)  Lovastatin 20 Mg  Tabs (Lovastatin) .... Once Daily 4)  Janumet 50-1000 Mg  Tabs (Sitagliptin-Metformin Hcl) .[name].. 1 By Mouth Bid 5)  Accupril 40 Mg  Tabs (Quinapril Hcl) .[name].. 1 By Mouth Qd 6)  Spironolactone 50 Mg  Tabs (Spironolactone) .[name].. 1 By Mouth Qd 7)  Aspir-Low 81 Mg Tbec (Aspirin) .[name].. 1 Once Daily After Meal 8)  Vitamin D3 1000 Unit  Tabs (Cholecalciferol) .[name].. 1 Qd 9)  Actos 30 Mg Tabs (Pioglitazone Hcl) .[name].. 1 By Mouth Qd  Allergies (verified): 1)  Hydrochlorothiazide  Past History:  Past Medical History: Last updated: 10/20/2008 Hyperlipidemia Hypertension Diabetes mellitus, type II Obesity  Past Surgical History: Last updated: 12/19/2006 Hysterectomy   Social History: Last updated: 04/11/2007 Occupation: [employer] Never Smoked Married  Review of Systems  The patient denies fever, dyspnea on exertion, peripheral edema, and abdominal pain.    Physical Exam  General:  overweight-appearing.   Nose:  External nasal examination shows no deformity or inflammation. Nasal mucosa are pink and moist without lesions or exudates. Mouth:  Oral mucosa and oropharynx without lesions or exudates.  Teeth in  good repair. Lungs:  Normal respiratory effort, chest expands symmetrically. Lungs are clear to auscultation, no crackles or wheezes. Heart:  Normal rate and regular rhythm. S1 and S2 normal without gallop, murmur, click, rub or other extra sounds. Abdomen:  Bowel sounds positive,abdomen soft and non-tender without masses, organomegaly or hernias noted. Msk:  No deformity or scoliosis noted of thoracic or lumbar spine.   Extremities:  No edema Neurologic:  No cranial nerve deficits noted. Station and gait are normal. Plantar reflexes are down-going bilaterally. DTRs are symmetrical throughout. Sensory, motor and coordinative functions appear intact. Skin:  Intact without suspicious lesions or rashes Psych:  Cognition and judgment appear intact. Alert and cooperative with normal attention span and concentration. No apparent delusions, illusions, hallucinations   Impression & Recommendations:  Problem # 1:  DIABETES MELLITUS, TYPE II (ICD-250.00) Assessment Improved  Her updated medication list for this problem includes:    Amaryl 2 Mg Tabs (Glimepiride) .[name]..[name] Two times a day    Janumet 50-1000 Mg Tabs (Sitagliptin-metformin hcl) .[name]... 1 by mouth bid    Accupril 40 Mg Tabs (Quinapril hcl) .[name]... 1 by mouth qd    Aspir-low 81 Mg Tbec (Aspirin) .[name]... 1 once daily after meal    Actos 30 Mg Tabs (Pioglitazone hcl) .[name]... 1 by mouth qd  Problem # 2:  OBESITY (ICD-278.00) Assessment: Deteriorated Diet discussed  Problem # 3:  HYPERTENSION (ICD-401.9) Assessment: Improved  Her updated medication list for this problem includes:    Verapamil Hcl Cr 240 Mg Xr24h-cap (Verapamil hcl) ..... Once daily    Accupril 40 Mg Tabs (Quinapril hcl) .[name]... 1 by mouth qd    Spironolactone 50 Mg Tabs (Spironolactone) .[name]... 1 by mouth qd  Problem # 4:  LEG CRAMPS (ICD-729.82) Assessment: Improved  Complete Medication List: 1)  Verapamil Hcl Cr 240 Mg Xr24h-cap (Verapamil hcl) .... Once daily 2)  Amaryl 2 Mg  Tabs (Glimepiride) .... Two times a day 3)  Lovastatin 20 Mg Tabs (Lovastatin) .... Once daily 4)  Janumet 50-1000 Mg Tabs (Sitagliptin-metformin hcl) .[name].. 1 by mouth bid 5)  Accupril 40 Mg Tabs (Quinapril hcl) .[name].. 1 by mouth qd 6)  Spironolactone 50 Mg Tabs (Spironolactone) .[name].. 1 by mouth qd 7)  Aspir-low 81 Mg Tbec (Aspirin) .[name].. 1 once daily after meal 8)  Vitamin D3 1000 Unit Tabs (Cholecalciferol) .[name].. 1 qd 9)  Actos 30 Mg Tabs (Pioglitazone hcl) .[name].. 1 by mouth qd  Patient Instructions: 1)  Please schedule a follow-up appointment in 4 months well w/labs and 2)  HbgA1C prior to visit, ICD-9:250.00

## 2010-04-13 NOTE — Letter (Signed)
Summary: Generic Letter  [facility]  [address] [place], [place] [address]   Phone: [phone]  Fax: [phone]    03/31/2010  BREONIA KIRSTEIN [address] [place], [place]  [address]  Dear Ms. Pfluger,  We have been unable to reach you by phone. Dr Posey Rea feels that you do not need a pap smear. Please contact our office with any questions or concerns.    Sincerely,   Lamar Sprinkles, CMA (AAMA) for Dr A. Daryle Boyington

## 2010-04-13 NOTE — Progress Notes (Signed)
Summary: Colon due  Phone Note Outgoing Call   Call placed by: Lanier Prude, [facility]),  March 14, 2010 1:23 PM Summary of Call: left mess for pt to call back to inform her that her last colon was 08/07/2006 and based on the report she is due again in 2018.  Follow-up for Phone Call        no answer/no return call Follow-up by: Lanier Prude, [facility]),  March 23, 2010 9:27 AM  Additional Follow-up for Phone Call Additional follow up Details #1::        closing phone note.      Preventive Care Screening  Colonoscopy:    Date:  08/07/2006    Results:  normal     due 2018

## 2010-04-13 NOTE — Progress Notes (Signed)
Summary: Pap smear  Phone Note Call from Patient   Caller: Patient Summary of Call: pt needs to know whether she needs pap smear or not. please advise Initial call taken by: Lanier Prude, [facility]),  March 29, 2010 3:05 PM  Follow-up for Phone Call        No need for PAP Thank you!  Follow-up by: Tresa Garter MD,  March 30, 2010 7:51 AM  Additional Follow-up for Phone Call Additional follow up Details #1::        left mess to call office back...................Marland KitchenLamar Sprinkles, CMA  March 30, 2010 4:11 PM  left message on machine for pt to return my call. Margaret Pyle, CMA  March 31, 2010 4:42 PM      Appended Document: Pap smear letter mailed

## 2010-04-13 NOTE — Assessment & Plan Note (Signed)
Summary: CPX/ NWS   Vital Signs:  Patient profile:   65 year old female Height:      64 inches Weight:      253 pounds BMI:     43.58 Temp:     98.4 degrees F oral Pulse rate:   80 / minute Pulse rhythm:   regular Resp:     16 per minute BP sitting:   140 / 88  (left arm) Cuff size:   large  Vitals Entered By: Lanier Prude, Beverly Gust) (March 09, 2010 11:06 AM) CC: CPX Is Patient Diabetic? Yes   CC:  CPX.  History of Present Illness: The patient presents for a preventive health examination Not taking Lovastatin   Current Medications (verified): 1)  Verapamil Hcl Cr 240 Mg Xr24h-Cap (Verapamil Hcl) .... Once Daily 2)  Amaryl 2 Mg  Tabs (Glimepiride) .... Two Times A Day 3)  Lovastatin 20 Mg  Tabs (Lovastatin) .... Once Daily 4)  Janumet 50-1000 Mg  Tabs (Sitagliptin-Metformin Hcl) .[name].. 1 By Mouth Bid 5)  Accupril 40 Mg  Tabs (Quinapril Hcl) .[name].. 1 By Mouth Qd 6)  Spironolactone 50 Mg  Tabs (Spironolactone) .[name].. 1 By Mouth Qd 7)  Aspir-Low 81 Mg Tbec (Aspirin) .[name].. 1 Once Daily After Meal 8)  Vitamin D3 1000 Unit  Tabs (Cholecalciferol) .[name].. 1 Qd 9)  Actos 30 Mg Tabs (Pioglitazone Hcl) .[name].. 1 By Mouth Qd  Allergies (verified): 1)  Hydrochlorothiazide 2)  Lipitor  Past History:  Past Medical History: Last updated: 10/20/2008 Hyperlipidemia Hypertension Diabetes mellitus, type II Obesity  Family History: Last updated: 09/03/2007 Family History Hypertension  Social History: Last updated: 04/11/2007 Occupation: [employer] Never Smoked Married  Past Surgical History: Hysterectomy  Colonosc here 2008? - it was nl  Review of Systems       The patient complains of weight gain.  The patient denies anorexia, fever, weight loss, vision loss, decreased hearing, hoarseness, chest pain, syncope, dyspnea on exertion, peripheral edema, prolonged cough, headaches, hemoptysis, abdominal pain, melena, hematochezia, severe indigestion/heartburn, hematuria,  incontinence, genital sores, muscle weakness, suspicious skin lesions, transient blindness, difficulty walking, depression, unusual weight change, abnormal bleeding, enlarged lymph nodes, angioedema, and breast masses.    Physical Exam  General:  overweight-appearing.   Head:  Normocephalic and atraumatic without obvious abnormalities. No apparent alopecia or balding. Eyes:  No corneal or conjunctival inflammation noted. EOMI. Perrla. Tearing is exessive Ears:  External ear exam shows no significant lesions or deformities.  Otoscopic examination reveals clear canals, tympanic membranes are intact bilaterally without bulging, retraction, inflammation or discharge. Hearing is grossly normal bilaterally. Nose:  External nasal examination shows no deformity or inflammation. Nasal mucosa are pink and moist without lesions or exudates. Mouth:  Oral mucosa and oropharynx without lesions or exudates.  Teeth in good repair. Breasts:  B breasts with mild cystic disease, WNL Lungs:  Normal respiratory effort, chest expands symmetrically. Lungs are clear to auscultation, no crackles or wheezes. Heart:  Normal rate and regular rhythm. S1 and S2 normal without gallop, murmur, click, rub or other extra sounds. Abdomen:  Bowel sounds positive,abdomen soft and non-tender without masses, organomegaly or hernias noted. Msk:  No deformity or scoliosis noted of thoracic or lumbar spine.   Pulses:  R and L carotid,radial,femoral,dorsalis pedis and posterior tibial pulses are full and equal bilaterally Extremities:  No edema Neurologic:  No cranial nerve deficits noted. Station and gait are normal. Plantar reflexes are down-going bilaterally. DTRs are symmetrical throughout. Sensory, motor and  coordinative functions appear intact. Skin:  Intact without suspicious lesions or rashes Cervical Nodes:  No lymphadenopathy noted Axillary Nodes:  No palpable lymphadenopathy Inguinal Nodes:  No significant adenopathy Psych:   Cognition and judgment appear intact. Alert and cooperative with normal attention span and concentration. No apparent delusions, illusions, hallucinations   Impression & Recommendations:  Problem # 1:  ROUTINE GENERAL MEDICAL EXAM@HEALTH  CARE FACL (ICD-V70.0) Assessment New Mammo Jan 6 is pending  Health and age related issues were discussed. Available screening tests and vaccinations were discussed as well. Healthy life style including good diet and exercise was discussed.  The labs were reviewed with the patient.   Problem # 2:  ABDOMINAL DISTENSION (ICD-787.3) mild Assessment: New  Will get pelvic US  Orders: Radiology Referral (Radiology)  Problem # 3:  HYPERLIPIDEMIA (ICD-272.4) Assessment: Deteriorated Risks of noncompliance with treatment discussed. Compliance encouraged.  Her updated medication list for this problem includes:    Lovastatin 20 Mg Tabs (Lovastatin) ..... Once daily - may need to go to 40 mg  Problem # 4:  DIABETES MELLITUS, TYPE II (ICD-250.00)with low CBGs lately Assessment: Unchanged See "Patient Instructions".  Her updated medication list for this problem includes:    Amaryl 2 Mg Tabs (Glimepiride) .[name]..[name] Two times a day    Janumet 50-1000 Mg Tabs (Sitagliptin-metformin hcl) .[name]... 1 by mouth bid    Accupril 40 Mg Tabs (Quinapril hcl) .[name]... 1 by mouth qd    Aspir-low 81 Mg Tbec (Aspirin) .[name]... 1 once daily after meal    Actos 30 Mg Tabs (Pioglitazone hcl) .[name]... 1 by mouth qd  Problem # 5:  OBESITY (ICD-278.00) Assessment: Deteriorated LapBand suggested  Complete Medication List: 1)  Verapamil Hcl Cr 240 Mg Xr24h-cap (Verapamil hcl) .... Once daily 2)  Amaryl 2 Mg Tabs (Glimepiride) .... Two times a day 3)  Lovastatin 20 Mg Tabs (Lovastatin) .... Once daily 4)  Janumet 50-1000 Mg Tabs (Sitagliptin-metformin hcl) .[name].. 1 by mouth bid 5)  Accupril 40 Mg Tabs (Quinapril hcl) .[name].. 1 by mouth qd 6)  Spironolactone 50 Mg Tabs (Spironolactone) .[name].. 1 by  mouth qd 7)  Aspir-low 81 Mg Tbec (Aspirin) .[name].. 1 once daily after meal 8)  Vitamin D3 1000 Unit Tabs (Cholecalciferol) .[name].. 1 qd 9)  Actos 30 Mg Tabs (Pioglitazone hcl) .[name].. 1 by mouth qd  Other Orders: EKG w/ Interpretation (93000) Tdap => 47yrs IM (16109) Pneumococcal Vaccine (60454) Admin 1st Vaccine (09811) Admin of Any Addtl Vaccine (91478)  Patient Instructions: 1)  Please schedule a follow-up appointment in 4 months. 2)  BMP prior to visit, ICD-9: 3)  HbgA1C prior to visit, ICD-9:250.00 4)  Take Glimepiride 1/2 tab two times a day OR 1 in am and 1/2 at hs to avoid low sugar episodes   Orders Added: 1)  EKG w/ Interpretation [93000] 2)  Tdap => 12yrs IM [90715] 3)  Pneumococcal Vaccine [90732] 4)  Admin 1st Vaccine [90471] 5)  Admin of Any Addtl Vaccine [90472] 6)  Radiology Referral [Radiology] 7)  Est. Patient 40-64 years [29562]   Immunization History:  Influenza Immunization History:    Influenza:  historical (12/21/2009)  Immunizations Administered:  Tetanus Vaccine:    Vaccine Type: Tdap    Site: left deltoid    Mfr: GlaxoSmithKline    Dose: 0.5 ml    Route: IM    Given by: Lanier Prude, CMA(AAMA)    Exp. Date: 12/30/2011    Lot #: [id number]    VIS given: 01/28/08 version given  March 09, 2010.  Pneumonia Vaccine:    Vaccine Type: Pneumovax    Site: right deltoid    Mfr: Merck    Dose: 0.5 ml    Route: IM    Given by: Lanier Prude, CMA(AAMA)    Exp. Date: 08/04/2011    Lot #: [id number]    VIS given: 02/14/09 version given March 09, 2010.   Immunization History:  Influenza Immunization History:    Influenza:  Historical (12/21/2009)  Immunizations Administered:  Tetanus Vaccine:    Vaccine Type: Tdap    Site: left deltoid    Mfr: GlaxoSmithKline    Dose: 0.5 ml    Route: IM    Given by: Lanier Prude, CMA(AAMA)    Exp. Date: 12/30/2011    Lot #: [id number]    VIS given: 01/28/08 version given March 09, 2010.  Pneumonia  Vaccine:    Vaccine Type: Pneumovax    Site: right deltoid    Mfr: Merck    Dose: 0.5 ml    Route: IM    Given by: Lanier Prude, CMA(AAMA)    Exp. Date: 08/04/2011    Lot #: [id number]    VIS given: 02/14/09 version given March 09, 2010.

## 2010-04-13 NOTE — Progress Notes (Signed)
Summary: Rt knee pain  Phone Note Other Incoming   Caller: [facility] Summary of Call: pt is there now to have transvag u/s. Pt c/o Rt knee pain and wanted order for xray of knee to do while there. I informed caller Dr. Posey Rea is out of office until tom but I will inform him of pt's complaint and see what his advisement is when he returns 03-29-09 and call pt then. Initial call taken by: Lanier Prude, [facility]),  March 28, 2010 1:12 PM  Follow-up for Phone Call        Noted Thank you!  Follow-up by: Tresa Garter MD,  March 28, 2010 11:20 PM  Additional Follow-up for Phone Call Additional follow up Details #1::        What should pt do for persistant knee pain? Additional Follow-up by: Lanier Prude, [facility]),  March 29, 2010 9:21 AM    Additional Follow-up for Phone Call Additional follow up Details #2::    Try Ibuprofen RX OV if not better Follow-up by: Tresa Garter MD,  March 29, 2010 1:12 PM  Additional Follow-up for Phone Call Additional follow up Details #3:: Details for Additional Follow-up Action Taken: pt informed  Additional Follow-up by: Lanier Prude, [facility]),  March 29, 2010 2:58 PM  New/Updated Medications: IBUPROFEN 600 MG TABS (IBUPROFEN) 1 by mouth bid  pc x 1 wk then as needed for  pain Prescriptions: IBUPROFEN 600 MG TABS (IBUPROFEN) 1 by mouth bid  pc x 1 wk then as needed for  pain  #60 x 3   Entered by:   Lanier Prude, CMA(AAMA)   Authorized by:   Tresa Garter MD   Signed by:   Lanier Prude, [facility]) on 03/29/2010   Method used:   Electronically to        [facility]* (retail)       [address]       [address] Shipping/mailing       [place], [place]  [address]       Ph: [phone]       Fax: [phone]   RxID:   [id number]

## 2010-07-04 ENCOUNTER — Other Ambulatory Visit (INDEPENDENT_AMBULATORY_CARE_PROVIDER_SITE_OTHER): Payer: Self-pay | Admitting: Internal Medicine

## 2010-07-04 DIAGNOSIS — IMO0001 Reserved for inherently not codable concepts without codable children: Secondary | ICD-10-CM

## 2010-07-04 LAB — HEMOGLOBIN A1C: Hgb A1c MFr Bld: 7.1 % — ABNORMAL HIGH (ref 4.6–6.5)

## 2010-07-04 LAB — BASIC METABOLIC PANEL
BUN: 16 mg/dL (ref 6–23)
CO2: 26 mEq/L (ref 19–32)
Calcium: 9.1 mg/dL (ref 8.4–10.5)
Chloride: 108 mEq/L (ref 96–112)
Creatinine, Ser: 0.7 mg/dL (ref 0.4–1.2)
GFR: 111.92 mL/min (ref >60.00)
Glucose, Bld: 101 mg/dL — ABNORMAL HIGH (ref 70–99)
Potassium: 4.8 mEq/L (ref 3.5–5.1)
Sodium: 140 mEq/L (ref 135–145)

## 2010-07-06 ENCOUNTER — Ambulatory Visit (INDEPENDENT_AMBULATORY_CARE_PROVIDER_SITE_OTHER): Payer: 59 | Admitting: Internal Medicine

## 2010-07-06 ENCOUNTER — Encounter: Payer: Self-pay | Admitting: Internal Medicine

## 2010-07-06 DIAGNOSIS — E119 Type 2 diabetes mellitus without complications: Secondary | ICD-10-CM

## 2010-07-06 DIAGNOSIS — I10 Essential (primary) hypertension: Secondary | ICD-10-CM

## 2010-07-06 DIAGNOSIS — R252 Cramp and spasm: Secondary | ICD-10-CM

## 2010-07-06 MED ORDER — AZILSARTAN MEDOXOMIL 80 MG PO TABS: 1.0000 | ORAL_TABLET | Freq: Every day | ORAL | Status: DC

## 2010-07-06 NOTE — Assessment & Plan Note (Signed)
A little worse. Loose wt advised.

## 2010-07-06 NOTE — Assessment & Plan Note (Signed)
Better. Cont w/current meds

## 2010-07-06 NOTE — Assessment & Plan Note (Signed)
Worse. Will d/c Quinapril. Start [place].

## 2010-07-06 NOTE — Progress Notes (Signed)
  Subjective:    Patient ID: Katelyn Peterson, female    DOB: 10/30/1945, 65 y.o.   MRN: [id number]  HPI  The patient presents for a follow-up of  chronic hypertension, chronic dyslipidemia, type 2 diabetes controlled with medicines  Wt Readings from Last 3 Encounters:  07/06/10 255 lb (115.667 kg)  03/09/10 253 lb (114.76 kg)  10/07/09 251 lb (113.853 kg)   BP Readings from Last 3 Encounters:  07/06/10 150/80  03/09/10 140/88  10/07/09 148/80     Review of Systems     Objective:   Physical Exam  Constitutional: She appears well-developed. No distress.       Obese   HENT:  Head: Normocephalic.  Right Ear: External ear normal.  Left Ear: External ear normal.  Nose: Nose normal.  Mouth/Throat: Oropharynx is clear and moist.  Eyes: Conjunctivae are normal. Pupils are equal, round, and reactive to light. Right eye exhibits no discharge. Left eye exhibits no discharge.  Neck: Normal range of motion. Neck supple. No JVD present. No tracheal deviation present. No thyromegaly present.  Cardiovascular: Normal rate, regular rhythm and normal heart sounds.   Pulmonary/Chest: No stridor. No respiratory distress. She has no wheezes.  Abdominal: Soft. Bowel sounds are normal. She exhibits no distension and no mass. There is no tenderness. There is no rebound and no guarding.  Musculoskeletal: She exhibits no edema and no tenderness.  Lymphadenopathy:    She has no cervical adenopathy.  Neurological: She displays normal reflexes. No cranial nerve deficit. She exhibits normal muscle tone. Coordination normal.  Skin: No rash noted. No erythema.  Psychiatric: She has a normal mood and affect. Her behavior is normal. Judgment and thought content normal.        Lab Results  Component Value Date   WBC 6.2 03/01/2010   HGB 11.1* 03/01/2010   HCT 33.1* 03/01/2010   PLT 244.0 03/01/2010   CHOL 225* 03/01/2010   TRIG 37.0 03/01/2010   HDL 52.90 03/01/2010   LDLDIRECT 165.6 03/01/2010   ALT 18 03/01/2010   AST 12 03/01/2010   NA 140 07/04/2010   K 4.8 07/04/2010   CL 108 07/04/2010   CREATININE 0.7 07/04/2010   BUN 16 07/04/2010   CO2 26 07/04/2010   TSH 2.31 03/01/2010   HGBA1C 7.1* 07/04/2010     Assessment & Plan:  LEG CRAMPS Better. Cont w/current meds  DIABETES MELLITUS, TYPE II A little worse. Loose wt advised.  HYPERTENSION Worse. Will d/c Quinapril. Start [place].

## 2010-07-10 ENCOUNTER — Other Ambulatory Visit: Payer: Self-pay | Admitting: Internal Medicine

## 2010-07-10 ENCOUNTER — Encounter: Payer: Self-pay | Admitting: Internal Medicine

## 2010-10-04 ENCOUNTER — Other Ambulatory Visit: Payer: 59

## 2010-11-02 ENCOUNTER — Other Ambulatory Visit (INDEPENDENT_AMBULATORY_CARE_PROVIDER_SITE_OTHER): Payer: 59

## 2010-11-02 DIAGNOSIS — E119 Type 2 diabetes mellitus without complications: Secondary | ICD-10-CM

## 2010-11-02 DIAGNOSIS — I10 Essential (primary) hypertension: Secondary | ICD-10-CM

## 2010-11-02 DIAGNOSIS — R252 Cramp and spasm: Secondary | ICD-10-CM

## 2010-11-02 LAB — COMPREHENSIVE METABOLIC PANEL
ALT: 13 U/L (ref 0–35)
AST: 12 U/L (ref 0–37)
Albumin: 3.7 g/dL (ref 3.5–5.2)
Alkaline Phosphatase: 86 U/L (ref 39–117)
Glucose, Bld: 119 mg/dL — ABNORMAL HIGH (ref 70–99)
Potassium: 5 mEq/L (ref 3.5–5.1)
Sodium: 140 mEq/L (ref 135–145)
Total Protein: 7.4 g/dL (ref 6.0–8.3)

## 2010-11-07 ENCOUNTER — Other Ambulatory Visit: Payer: Self-pay | Admitting: *Deleted

## 2010-11-07 MED ORDER — VERAPAMIL HCL ER 240 MG PO CP24: 240.0000 mg | ORAL_CAPSULE | Freq: Every day | ORAL | Status: DC

## 2010-11-07 MED ORDER — SPIRONOLACTONE 50 MG PO TABS: 50.0000 mg | ORAL_TABLET | Freq: Every day | ORAL | Status: DC

## 2010-11-08 ENCOUNTER — Ambulatory Visit (INDEPENDENT_AMBULATORY_CARE_PROVIDER_SITE_OTHER): Payer: 59 | Admitting: Internal Medicine

## 2010-11-08 ENCOUNTER — Other Ambulatory Visit: Payer: Self-pay | Admitting: *Deleted

## 2010-11-08 ENCOUNTER — Encounter: Payer: Self-pay | Admitting: Internal Medicine

## 2010-11-08 DIAGNOSIS — E119 Type 2 diabetes mellitus without complications: Secondary | ICD-10-CM

## 2010-11-08 DIAGNOSIS — I10 Essential (primary) hypertension: Secondary | ICD-10-CM

## 2010-11-08 DIAGNOSIS — E669 Obesity, unspecified: Secondary | ICD-10-CM

## 2010-11-08 MED ORDER — QUINAPRIL HCL 40 MG PO TABS: 40.0000 mg | ORAL_TABLET | Freq: Every day | ORAL | Status: DC

## 2010-11-08 NOTE — Assessment & Plan Note (Signed)
Better  

## 2010-11-08 NOTE — Assessment & Plan Note (Signed)
Discussed.

## 2010-11-08 NOTE — Progress Notes (Signed)
  Subjective:    Patient ID: Katelyn Peterson, female    DOB: 1946/02/25, 65 y.o.   MRN: [id number]  HPI  The patient presents for a follow-up of  chronic hypertension, chronic dyslipidemia, type 2 diabetes controlled with medicines    Review of Systems  Constitutional: Negative for chills, activity change, appetite change, fatigue and unexpected weight change.  HENT: Negative for congestion, mouth sores and sinus pressure.   Eyes: Negative for visual disturbance.  Respiratory: Negative for cough and chest tightness.   Gastrointestinal: Negative for nausea and abdominal pain.  Genitourinary: Negative for frequency, difficulty urinating and vaginal pain.  Musculoskeletal: Negative for back pain and gait problem.  Skin: Negative for pallor and rash.  Neurological: Negative for dizziness, tremors, weakness, numbness and headaches.  Psychiatric/Behavioral: Negative for confusion and sleep disturbance.       Objective:   Physical Exam  Constitutional: She appears well-developed. No distress.       Obese  HENT:  Head: Normocephalic.  Right Ear: External ear normal.  Left Ear: External ear normal.  Nose: Nose normal.  Mouth/Throat: Oropharynx is clear and moist.  Eyes: Conjunctivae are normal. Pupils are equal, round, and reactive to light. Right eye exhibits no discharge. Left eye exhibits no discharge.  Neck: Normal range of motion. Neck supple. No JVD present. No tracheal deviation present. No thyromegaly present.  Cardiovascular: Normal rate, regular rhythm and normal heart sounds.   Pulmonary/Chest: No stridor. No respiratory distress. She has no wheezes.  Abdominal: Soft. Bowel sounds are normal. She exhibits no distension and no mass. There is no tenderness. There is no rebound and no guarding.  Musculoskeletal: She exhibits no edema and no tenderness.  Lymphadenopathy:    She has no cervical adenopathy.  Neurological: She displays normal reflexes. No cranial nerve deficit. She  exhibits normal muscle tone. Coordination normal.  Skin: No rash noted. No erythema.  Psychiatric: She has a normal mood and affect. Her behavior is normal. Judgment and thought content normal.   BP Readings from Last 3 Encounters:  11/08/10 130/82  07/06/10 150/80  03/09/10 140/88   Wt Readings from Last 3 Encounters:  11/08/10 258 lb (117.028 kg)  07/06/10 255 lb (115.667 kg)  03/09/10 253 lb (114.76 kg)   Lab Results  Component Value Date   WBC 6.2 03/01/2010   HGB 11.1* 03/01/2010   HCT 33.1* 03/01/2010   PLT 244.0 03/01/2010   CHOL 225* 03/01/2010   TRIG 37.0 03/01/2010   HDL 52.90 03/01/2010   LDLDIRECT 165.6 03/01/2010   ALT 13 11/02/2010   AST 12 11/02/2010   NA 140 11/02/2010   K 5.0 11/02/2010   CL 109 11/02/2010   CREATININE 0.8 11/02/2010   BUN 18 11/02/2010   CO2 26 11/02/2010   TSH 2.31 03/01/2010   HGBA1C 6.8* 11/02/2010            Assessment & Plan:

## 2010-11-08 NOTE — Assessment & Plan Note (Signed)
Better on Rx 

## 2011-01-29 ENCOUNTER — Other Ambulatory Visit: Payer: Self-pay | Admitting: Internal Medicine

## 2011-01-29 MED ORDER — GLIMEPIRIDE 2 MG PO TABS: 2.0000 mg | ORAL_TABLET | Freq: Two times a day (BID) | ORAL | Status: DC

## 2011-01-29 NOTE — Telephone Encounter (Signed)
Pt left vm req Rf for glimepiride be sent to [facility]. Rf sent. Left detailed mess informing pt.

## 2011-03-16 ENCOUNTER — Ambulatory Visit: Payer: 59 | Admitting: Internal Medicine

## 2011-04-04 ENCOUNTER — Ambulatory Visit: Payer: 59 | Admitting: Internal Medicine

## 2011-04-11 ENCOUNTER — Other Ambulatory Visit (INDEPENDENT_AMBULATORY_CARE_PROVIDER_SITE_OTHER): Payer: 59

## 2011-04-11 DIAGNOSIS — E119 Type 2 diabetes mellitus without complications: Secondary | ICD-10-CM

## 2011-04-11 DIAGNOSIS — E669 Obesity, unspecified: Secondary | ICD-10-CM

## 2011-04-11 DIAGNOSIS — I10 Essential (primary) hypertension: Secondary | ICD-10-CM

## 2011-04-11 LAB — COMPREHENSIVE METABOLIC PANEL
BUN: 14 mg/dL (ref 6–23)
CO2: 28 mEq/L (ref 19–32)
Calcium: 9.2 mg/dL (ref 8.4–10.5)
Chloride: 107 mEq/L (ref 96–112)
Creatinine, Ser: 1 mg/dL (ref 0.4–1.2)
GFR: 70.73 mL/min (ref >60.00)
Glucose, Bld: 241 mg/dL — ABNORMAL HIGH (ref 70–99)
Total Bilirubin: 0.4 mg/dL (ref 0.3–1.2)

## 2011-04-11 LAB — HEMOGLOBIN A1C: Hgb A1c MFr Bld: 7.7 % — ABNORMAL HIGH (ref 4.6–6.5)

## 2011-04-11 LAB — LIPID PANEL
Cholesterol: 234 mg/dL — ABNORMAL HIGH (ref 0–200)
Triglycerides: 74 mg/dL (ref 0.0–149.0)

## 2011-04-12 LAB — LDL CHOLESTEROL, DIRECT: Direct LDL: 169.4 mg/dL

## 2011-04-20 ENCOUNTER — Ambulatory Visit (INDEPENDENT_AMBULATORY_CARE_PROVIDER_SITE_OTHER): Payer: 59 | Admitting: Internal Medicine

## 2011-04-20 ENCOUNTER — Encounter: Payer: Self-pay | Admitting: Internal Medicine

## 2011-04-20 DIAGNOSIS — F4321 Adjustment disorder with depressed mood: Secondary | ICD-10-CM

## 2011-04-20 DIAGNOSIS — I10 Essential (primary) hypertension: Secondary | ICD-10-CM

## 2011-04-20 DIAGNOSIS — E119 Type 2 diabetes mellitus without complications: Secondary | ICD-10-CM

## 2011-04-20 DIAGNOSIS — E785 Hyperlipidemia, unspecified: Secondary | ICD-10-CM

## 2011-04-20 DIAGNOSIS — J310 Chronic rhinitis: Secondary | ICD-10-CM | POA: Insufficient documentation

## 2011-04-20 MED ORDER — PRAVASTATIN SODIUM 20 MG PO TABS: 20.0000 mg | ORAL_TABLET | Freq: Every day | ORAL | Status: DC

## 2011-04-20 MED ORDER — PIOGLITAZONE HCL 30 MG PO TABS: 30.0000 mg | ORAL_TABLET | Freq: Every day | ORAL | Status: DC

## 2011-04-20 NOTE — Progress Notes (Signed)
  Subjective:    Patient ID: Katelyn Peterson, female    DOB: August 16, 1945, 66 y.o.   MRN: [id number]  HPI The patient presents for a follow-up of  chronic hypertension, chronic dyslipidemia, type 2 diabetes controlled with medicines  Mother died - sad   Review of Systems  Constitutional: Negative for chills, activity change, appetite change, fatigue and unexpected weight change.  HENT: Negative for congestion, mouth sores and sinus pressure.   Eyes: Negative for visual disturbance.  Respiratory: Negative for cough and chest tightness.   Gastrointestinal: Negative for nausea and abdominal pain.  Genitourinary: Negative for frequency, difficulty urinating and vaginal pain.  Musculoskeletal: Negative for back pain and gait problem.  Skin: Negative for pallor and rash.  Neurological: Negative for dizziness, tremors, weakness, numbness and headaches.  Psychiatric/Behavioral: Positive for sleep disturbance. Negative for suicidal ideas and confusion. The patient is nervous/anxious.        Objective:   Physical Exam  Constitutional: She appears well-developed. No distress.  HENT:  Head: Normocephalic.  Right Ear: External ear normal.  Left Ear: External ear normal.  Nose: Nose normal.  Mouth/Throat: Oropharynx is clear and moist.  Eyes: Conjunctivae are normal. Pupils are equal, round, and reactive to light. Right eye exhibits no discharge. Left eye exhibits no discharge.  Neck: Normal range of motion. Neck supple. No JVD present. No tracheal deviation present. No thyromegaly present.  Cardiovascular: Normal rate, regular rhythm and normal heart sounds.   Pulmonary/Chest: No stridor. No respiratory distress. She has no wheezes.  Abdominal: Soft. Bowel sounds are normal. She exhibits no distension and no mass. There is no tenderness. There is no rebound and no guarding.  Musculoskeletal: She exhibits no edema and no tenderness.  Lymphadenopathy:    She has no cervical adenopathy.    Neurological: She displays normal reflexes. No cranial nerve deficit. She exhibits normal muscle tone. Coordination normal.  Skin: No rash noted. No erythema.  Psychiatric: She has a normal mood and affect. Her behavior is normal. Judgment and thought content normal.       sad    Lab Results  Component Value Date   WBC 6.2 03/01/2010   HGB 11.1* 03/01/2010   HCT 33.1* 03/01/2010   PLT 244.0 03/01/2010   GLUCOSE 241* 04/11/2011   CHOL 234* 04/11/2011   TRIG 74.0 04/11/2011   HDL 49.80 04/11/2011   LDLDIRECT 169.4 04/11/2011   LDLCALC 146* 10/12/2008   ALT 21 04/11/2011   AST 10 04/11/2011   NA 141 04/11/2011   K 5.0 04/11/2011   CL 107 04/11/2011   CREATININE 1.0 04/11/2011   BUN 14 04/11/2011   CO2 28 04/11/2011   TSH 2.31 03/01/2010   HGBA1C 7.7* 04/11/2011         Assessment & Plan:

## 2011-04-20 NOTE — Assessment & Plan Note (Signed)
Continue with current prescription therapy as reflected on the Med list.  

## 2011-04-20 NOTE — Assessment & Plan Note (Signed)
Discussed.

## 2011-04-20 NOTE — Assessment & Plan Note (Addendum)
Try Pravachol 

## 2011-04-20 NOTE — Assessment & Plan Note (Signed)
Flonase

## 2011-06-07 ENCOUNTER — Other Ambulatory Visit: Payer: Self-pay | Admitting: *Deleted

## 2011-06-07 MED ORDER — BAYER LOW DOSE 81 MG PO TBEC: 81.0000 mg | DELAYED_RELEASE_TABLET | Freq: Every day | ORAL | Status: AC

## 2011-06-11 ENCOUNTER — Other Ambulatory Visit: Payer: Self-pay | Admitting: Internal Medicine

## 2011-07-20 ENCOUNTER — Ambulatory Visit: Payer: 59 | Admitting: Internal Medicine

## 2011-08-07 ENCOUNTER — Other Ambulatory Visit (INDEPENDENT_AMBULATORY_CARE_PROVIDER_SITE_OTHER): Payer: 59

## 2011-08-07 DIAGNOSIS — E119 Type 2 diabetes mellitus without complications: Secondary | ICD-10-CM

## 2011-08-07 DIAGNOSIS — I10 Essential (primary) hypertension: Secondary | ICD-10-CM

## 2011-08-07 DIAGNOSIS — E785 Hyperlipidemia, unspecified: Secondary | ICD-10-CM

## 2011-08-07 DIAGNOSIS — F4321 Adjustment disorder with depressed mood: Secondary | ICD-10-CM

## 2011-08-07 DIAGNOSIS — J310 Chronic rhinitis: Secondary | ICD-10-CM

## 2011-08-07 LAB — BASIC METABOLIC PANEL
Calcium: 8.8 mg/dL (ref 8.4–10.5)
GFR: 88.62 mL/min (ref >60.00)
Glucose, Bld: 78 mg/dL (ref 70–99)
Potassium: 3.9 mEq/L (ref 3.5–5.1)
Sodium: 142 mEq/L (ref 135–145)

## 2011-08-07 LAB — LIPID PANEL
HDL: 68.1 mg/dL (ref >39.00)
Total CHOL/HDL Ratio: 3
Triglycerides: 78 mg/dL (ref 0.0–149.0)

## 2011-08-07 LAB — HEPATIC FUNCTION PANEL
ALT: 15 U/L (ref 0–35)
Alkaline Phosphatase: 76 U/L (ref 39–117)
Bilirubin, Direct: 0 mg/dL (ref 0.0–0.3)
Total Bilirubin: 0.5 mg/dL (ref 0.3–1.2)
Total Protein: 7.2 g/dL (ref 6.0–8.3)

## 2011-08-13 ENCOUNTER — Other Ambulatory Visit: Payer: Self-pay | Admitting: Internal Medicine

## 2011-08-13 ENCOUNTER — Telehealth: Payer: Self-pay | Admitting: Internal Medicine

## 2011-08-13 NOTE — Telephone Encounter (Signed)
REQUESTING REFILL ON ALDACTONE GENERIC TO [facility]

## 2011-08-13 NOTE — Telephone Encounter (Signed)
Rf done by Lucy B.,CMA.

## 2011-08-17 ENCOUNTER — Ambulatory Visit: Payer: 59 | Admitting: Internal Medicine

## 2011-08-22 ENCOUNTER — Ambulatory Visit (INDEPENDENT_AMBULATORY_CARE_PROVIDER_SITE_OTHER)
Admission: RE | Admit: 2011-08-22 | Discharge: 2011-08-22 | Disposition: A | Discharge status: Home / Self Care | Payer: 59 | Source: Ambulatory Visit | Attending: Internal Medicine | Admitting: Internal Medicine

## 2011-08-22 ENCOUNTER — Encounter: Payer: Self-pay | Admitting: Internal Medicine

## 2011-08-22 ENCOUNTER — Ambulatory Visit (INDEPENDENT_AMBULATORY_CARE_PROVIDER_SITE_OTHER): Payer: 59 | Admitting: Internal Medicine

## 2011-08-22 VITALS — BP 142/88 | HR 88 | Temp 98.4°F | Resp 16 | Wt 252.0 lb

## 2011-08-22 DIAGNOSIS — M25579 Pain in unspecified ankle and joints of unspecified foot: Secondary | ICD-10-CM

## 2011-08-22 DIAGNOSIS — M79609 Pain in unspecified limb: Secondary | ICD-10-CM

## 2011-08-22 DIAGNOSIS — E119 Type 2 diabetes mellitus without complications: Secondary | ICD-10-CM

## 2011-08-22 DIAGNOSIS — E669 Obesity, unspecified: Secondary | ICD-10-CM

## 2011-08-22 DIAGNOSIS — M25572 Pain in left ankle and joints of left foot: Secondary | ICD-10-CM | POA: Insufficient documentation

## 2011-08-22 DIAGNOSIS — I10 Essential (primary) hypertension: Secondary | ICD-10-CM

## 2011-08-22 DIAGNOSIS — M79672 Pain in left foot: Secondary | ICD-10-CM

## 2011-08-22 DIAGNOSIS — E785 Hyperlipidemia, unspecified: Secondary | ICD-10-CM

## 2011-08-22 NOTE — Assessment & Plan Note (Signed)
Continue with current prescription therapy as reflected on the Med list.  

## 2011-08-22 NOTE — Assessment & Plan Note (Signed)
Poss OA X ray Celebrex 1 a day x 1-2 wks

## 2011-08-22 NOTE — Progress Notes (Signed)
Patient ID: Katelyn Peterson, female   DOB: July 08, 1945, 66 y.o.   MRN: [id number]  Subjective:    Patient ID: Katelyn Peterson, female    DOB: 07/28/1945, 66 y.o.   MRN: [id number]  HPI The patient presents for a follow-up of  chronic hypertension, chronic dyslipidemia, type 2 diabetes controlled with medicines. C/o L ankle pain x wks  Wt Readings from Last 3 Encounters:  08/22/11 252 lb (114.306 kg)  04/20/11 240 lb (108.863 kg)  11/08/10 258 lb (117.028 kg)   BP Readings from Last 3 Encounters:  08/22/11 142/88  04/20/11 138/78  11/08/10 130/82       Review of Systems  Constitutional: Negative for activity change, appetite change and unexpected weight change.  HENT: Negative for mouth sores and sinus pressure.   Eyes: Negative for visual disturbance.  Respiratory: Negative for chest tightness.   Genitourinary: Negative for frequency, difficulty urinating and vaginal pain.  Musculoskeletal: Negative for back pain and gait problem.  Skin: Negative for pallor.  Neurological: Negative for dizziness and tremors.  Psychiatric/Behavioral: Positive for disturbed wake/sleep cycle. Negative for suicidal ideas and confusion. The patient is nervous/anxious.        Objective:   Physical Exam  Constitutional: She appears well-developed. No distress.  HENT:  Head: Normocephalic.  Right Ear: External ear normal.  Left Ear: External ear normal.  Nose: Nose normal.  Mouth/Throat: Oropharynx is clear and moist.  Eyes: Conjunctivae are normal. Pupils are equal, round, and reactive to light. Right eye exhibits no discharge. Left eye exhibits no discharge.  Neck: Normal range of motion. Neck supple. No JVD present. No tracheal deviation present. No thyromegaly present.  Cardiovascular: Normal rate, regular rhythm and normal heart sounds.   Pulmonary/Chest: No stridor. No respiratory distress. She has no wheezes.  Abdominal: Soft. Bowel sounds are normal. She exhibits no distension and no  mass. There is no tenderness. There is no rebound and no guarding.  Musculoskeletal: She exhibits no edema and no tenderness.  Lymphadenopathy:    She has no cervical adenopathy.  Neurological: She displays normal reflexes. No cranial nerve deficit. She exhibits normal muscle tone. Coordination normal.  Skin: No rash noted. No erythema.  Psychiatric: She has a normal mood and affect. Her behavior is normal. Judgment and thought content normal.       sad  L ankle is swollen and tender  Lab Results  Component Value Date   WBC 6.2 03/01/2010   HGB 11.1* 03/01/2010   HCT 33.1* 03/01/2010   PLT 244.0 03/01/2010   GLUCOSE 78 08/07/2011   CHOL 216* 08/07/2011   TRIG 78.0 08/07/2011   HDL 68.10 08/07/2011   LDLDIRECT 144.2 08/07/2011   LDLCALC 146* 10/12/2008   ALT 15 08/07/2011   AST 14 08/07/2011   NA 142 08/07/2011   K 3.9 08/07/2011   CL 108 08/07/2011   CREATININE 0.8 08/07/2011   BUN 18 08/07/2011   CO2 26 08/07/2011   TSH 2.31 03/01/2010   HGBA1C 6.7* 08/07/2011         Assessment & Plan:

## 2011-08-22 NOTE — Assessment & Plan Note (Signed)
Diet discussed 

## 2011-08-22 NOTE — Assessment & Plan Note (Signed)
Much better Continue with current prescription therapy as reflected on the Med list.  

## 2011-08-22 NOTE — Patient Instructions (Signed)
celebrex 200 mg/day x 1-2 wks

## 2011-08-22 NOTE — Assessment & Plan Note (Addendum)
Continue with current diet/wt loss Consider Welchol

## 2011-08-23 ENCOUNTER — Telehealth: Payer: Self-pay | Admitting: Internal Medicine

## 2011-08-23 NOTE — Telephone Encounter (Signed)
Ok OV Thx 

## 2011-08-23 NOTE — Telephone Encounter (Signed)
Called pt unable to leave message.

## 2011-08-23 NOTE — Telephone Encounter (Signed)
Pt informed. Copy mailed. She wants to know if she can get an injection in her foot for the pain. Please advise.

## 2011-08-23 NOTE — Telephone Encounter (Signed)
Misty Stanley, please, inform patient that her ankle was OK. 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 Mail the report pls. Rx as we discussed Thx

## 2011-08-24 NOTE — Telephone Encounter (Signed)
Katelyn Peterson. [facility] scheduler to contact pt to schedule OV.

## 2011-10-01 ENCOUNTER — Other Ambulatory Visit: Payer: Self-pay | Admitting: Internal Medicine

## 2011-12-20 ENCOUNTER — Other Ambulatory Visit: Payer: Self-pay | Admitting: Internal Medicine

## 2011-12-20 ENCOUNTER — Telehealth: Payer: Self-pay | Admitting: Internal Medicine

## 2011-12-20 NOTE — Telephone Encounter (Signed)
Patient needs a refill on verapamil sent to [facility] outpatient pharmacy

## 2011-12-21 MED ORDER — VERAPAMIL HCL ER 240 MG PO CP24: 240.0000 mg | ORAL_CAPSULE | Freq: Every day | ORAL | Status: DC

## 2011-12-21 NOTE — Telephone Encounter (Signed)
Done

## 2011-12-24 ENCOUNTER — Other Ambulatory Visit: Payer: Self-pay | Admitting: Family

## 2011-12-24 MED ORDER — METRONIDAZOLE 500 MG PO TABS: 500.0000 mg | ORAL_TABLET | Freq: Two times a day (BID) | ORAL | Status: DC

## 2011-12-25 ENCOUNTER — Other Ambulatory Visit (INDEPENDENT_AMBULATORY_CARE_PROVIDER_SITE_OTHER): Payer: 59

## 2011-12-25 DIAGNOSIS — I10 Essential (primary) hypertension: Secondary | ICD-10-CM

## 2011-12-25 DIAGNOSIS — M79672 Pain in left foot: Secondary | ICD-10-CM

## 2011-12-25 DIAGNOSIS — M79609 Pain in unspecified limb: Secondary | ICD-10-CM

## 2011-12-25 DIAGNOSIS — E119 Type 2 diabetes mellitus without complications: Secondary | ICD-10-CM

## 2011-12-25 DIAGNOSIS — E669 Obesity, unspecified: Secondary | ICD-10-CM

## 2011-12-25 DIAGNOSIS — E785 Hyperlipidemia, unspecified: Secondary | ICD-10-CM

## 2011-12-25 LAB — HEPATIC FUNCTION PANEL
Alkaline Phosphatase: 70 U/L (ref 39–117)
Bilirubin, Direct: 0 mg/dL (ref 0.0–0.3)
Total Protein: 7.5 g/dL (ref 6.0–8.3)

## 2011-12-25 LAB — HEMOGLOBIN A1C: Hgb A1c MFr Bld: 6.9 % — ABNORMAL HIGH (ref 4.6–6.5)

## 2011-12-25 LAB — BASIC METABOLIC PANEL
BUN: 23 mg/dL (ref 6–23)
Chloride: 109 mEq/L (ref 96–112)
Creatinine, Ser: 1 mg/dL (ref 0.4–1.2)

## 2011-12-25 LAB — LIPID PANEL: VLDL: 13 mg/dL (ref 0.0–40.0)

## 2012-01-03 ENCOUNTER — Other Ambulatory Visit: Payer: Self-pay | Admitting: *Deleted

## 2012-01-03 ENCOUNTER — Ambulatory Visit (INDEPENDENT_AMBULATORY_CARE_PROVIDER_SITE_OTHER): Payer: 59 | Admitting: Internal Medicine

## 2012-01-03 ENCOUNTER — Encounter: Payer: Self-pay | Admitting: Internal Medicine

## 2012-01-03 VITALS — BP 146/80 | HR 80 | Temp 96.9°F | Resp 16 | Wt 261.0 lb

## 2012-01-03 DIAGNOSIS — I10 Essential (primary) hypertension: Secondary | ICD-10-CM

## 2012-01-03 DIAGNOSIS — E669 Obesity, unspecified: Secondary | ICD-10-CM

## 2012-01-03 DIAGNOSIS — E119 Type 2 diabetes mellitus without complications: Secondary | ICD-10-CM

## 2012-01-03 DIAGNOSIS — E785 Hyperlipidemia, unspecified: Secondary | ICD-10-CM

## 2012-01-03 MED ORDER — METFORMIN HCL 1000 MG PO TABS: 1000.0000 mg | ORAL_TABLET | Freq: Two times a day (BID) | ORAL | Status: DC

## 2012-01-03 MED ORDER — LORCASERIN HCL 10 MG PO TABS: 1.0000 | ORAL_TABLET | Freq: Two times a day (BID) | ORAL | Status: DC

## 2012-01-03 NOTE — Assessment & Plan Note (Signed)
NAS  Loose wt

## 2012-01-03 NOTE — Assessment & Plan Note (Signed)
Low CBGs 2 h after breakfast per pt -- 40-70 Will d/c Janumet Start Metformin 1000 mg bid

## 2012-01-03 NOTE — Assessment & Plan Note (Signed)
Consider Welchol

## 2012-01-03 NOTE — Assessment & Plan Note (Addendum)
Worse  Diet discussed Lap band was suggested Belviq trial 10/13 Wt Readings from Last 3 Encounters:  01/03/12 261 lb (118.389 kg)  08/22/11 252 lb (114.306 kg)  04/20/11 240 lb (108.863 kg)

## 2012-01-03 NOTE — Progress Notes (Signed)
Patient ID: Katelyn Peterson, female   DOB: 1946/02/23, 66 y.o.   MRN: [id number] Patient ID: Katelyn Peterson, female   DOB: 11/16/1945, 66 y.o.   MRN: [id number]  Subjective:    Patient ID: Katelyn Peterson, female    DOB: Oct 12, 1945, 66 y.o.   MRN: [id number]  HPI The patient presents for a follow-up of  chronic hypertension, chronic dyslipidemia, type 2 diabetes controlled with medicines. C/o low CBGs 2 h after breakfast... C/o L ankle pain x wks  Wt Readings from Last 3 Encounters:  01/03/12 261 lb (118.389 kg)  08/22/11 252 lb (114.306 kg)  04/20/11 240 lb (108.863 kg)   BP Readings from Last 3 Encounters:  01/03/12 146/80  08/22/11 142/88  04/20/11 138/78       Review of Systems  Constitutional: Negative for activity change, appetite change and unexpected weight change.  HENT: Negative for mouth sores and sinus pressure.   Eyes: Negative for visual disturbance.  Respiratory: Negative for chest tightness.   Genitourinary: Negative for frequency, difficulty urinating and vaginal pain.  Musculoskeletal: Negative for back pain and gait problem.  Skin: Negative for pallor.  Neurological: Negative for dizziness and tremors.  Psychiatric/Behavioral: Positive for disturbed wake/sleep cycle. Negative for suicidal ideas and confusion. The patient is nervous/anxious.        Objective:   Physical Exam  Constitutional: She appears well-developed. No distress.  HENT:  Head: Normocephalic.  Right Ear: External ear normal.  Left Ear: External ear normal.  Nose: Nose normal.  Mouth/Throat: Oropharynx is clear and moist.  Eyes: Conjunctivae normal are normal. Pupils are equal, round, and reactive to light. Right eye exhibits no discharge. Left eye exhibits no discharge.  Neck: Normal range of motion. Neck supple. No JVD present. No tracheal deviation present. No thyromegaly present.  Cardiovascular: Normal rate, regular rhythm and normal heart sounds.   Pulmonary/Chest: No stridor. No  respiratory distress. She has no wheezes.  Abdominal: Soft. Bowel sounds are normal. She exhibits no distension and no mass. There is no tenderness. There is no rebound and no guarding.  Musculoskeletal: She exhibits no edema and no tenderness.  Lymphadenopathy:    She has no cervical adenopathy.  Neurological: She displays normal reflexes. No cranial nerve deficit. She exhibits normal muscle tone. Coordination normal.  Skin: No rash noted. No erythema.  Psychiatric: She has a normal mood and affect. Her behavior is normal. Judgment and thought content normal.       sad  L ankle is swollen and tender  Lab Results  Component Value Date   WBC 6.2 03/01/2010   HGB 11.1* 03/01/2010   HCT 33.1* 03/01/2010   PLT 244.0 03/01/2010   GLUCOSE 92 12/25/2011   CHOL 215* 12/25/2011   TRIG 65.0 12/25/2011   HDL 57.70 12/25/2011   LDLDIRECT 158.8 12/25/2011   LDLCALC 146* 10/12/2008   ALT 28 12/25/2011   AST 26 12/25/2011   NA 141 12/25/2011   K 4.5 12/25/2011   CL 109 12/25/2011   CREATININE 1.0 12/25/2011   BUN 23 12/25/2011   CO2 25 12/25/2011   TSH 2.31 03/01/2010   HGBA1C 6.9* 12/25/2011         Assessment & Plan:

## 2012-04-30 ENCOUNTER — Other Ambulatory Visit (INDEPENDENT_AMBULATORY_CARE_PROVIDER_SITE_OTHER): Payer: 59

## 2012-04-30 DIAGNOSIS — E669 Obesity, unspecified: Secondary | ICD-10-CM

## 2012-04-30 DIAGNOSIS — E119 Type 2 diabetes mellitus without complications: Secondary | ICD-10-CM

## 2012-04-30 DIAGNOSIS — E785 Hyperlipidemia, unspecified: Secondary | ICD-10-CM

## 2012-04-30 DIAGNOSIS — I10 Essential (primary) hypertension: Secondary | ICD-10-CM

## 2012-04-30 LAB — BASIC METABOLIC PANEL
BUN: 15 mg/dL (ref 6–23)
CO2: 26 mEq/L (ref 19–32)
Chloride: 109 mEq/L (ref 96–112)
Creatinine, Ser: 0.8 mg/dL (ref 0.4–1.2)

## 2012-05-06 ENCOUNTER — Ambulatory Visit (INDEPENDENT_AMBULATORY_CARE_PROVIDER_SITE_OTHER): Payer: 59 | Admitting: Internal Medicine

## 2012-05-06 ENCOUNTER — Encounter: Payer: Self-pay | Admitting: Internal Medicine

## 2012-05-06 VITALS — BP 130/78 | HR 76 | Temp 97.5°F | Resp 16 | Wt 259.0 lb

## 2012-05-06 DIAGNOSIS — R252 Cramp and spasm: Secondary | ICD-10-CM

## 2012-05-06 DIAGNOSIS — E119 Type 2 diabetes mellitus without complications: Secondary | ICD-10-CM

## 2012-05-06 DIAGNOSIS — F4321 Adjustment disorder with depressed mood: Secondary | ICD-10-CM

## 2012-05-06 DIAGNOSIS — E669 Obesity, unspecified: Secondary | ICD-10-CM

## 2012-05-06 DIAGNOSIS — E785 Hyperlipidemia, unspecified: Secondary | ICD-10-CM

## 2012-05-06 DIAGNOSIS — I10 Essential (primary) hypertension: Secondary | ICD-10-CM

## 2012-05-06 NOTE — Assessment & Plan Note (Signed)
Hold spironolactone x few days

## 2012-05-06 NOTE — Assessment & Plan Note (Signed)
Discussed - still grieving

## 2012-05-06 NOTE — Assessment & Plan Note (Signed)
Wt Readings from Last 3 Encounters:  05/06/12 259 lb (117.482 kg)  01/03/12 261 lb (118.389 kg)  08/22/11 252 lb (114.306 kg)

## 2012-05-06 NOTE — Assessment & Plan Note (Signed)
  On diet  

## 2012-05-06 NOTE — Assessment & Plan Note (Signed)
Continue with current prescription therapy as reflected on the Med list.  

## 2012-05-06 NOTE — Assessment & Plan Note (Signed)
Diab teaching Continue with current prescription therapy as reflected on the Med list. Take 1st Amaryl at lunch

## 2012-05-06 NOTE — Progress Notes (Signed)
   Subjective:    HPI The patient presents for a follow-up of  chronic hypertension, chronic dyslipidemia, type 2 diabetes controlled with medicines. C/o low CBGs 2 h after breakfast...55-65 C/o LE cramps B - chronic  Wt Readings from Last 3 Encounters:  05/06/12 259 lb (117.482 kg)  01/03/12 261 lb (118.389 kg)  08/22/11 252 lb (114.306 kg)   BP Readings from Last 3 Encounters:  05/06/12 130/78  01/03/12 146/80  08/22/11 142/88       Review of Systems  Constitutional: Negative for activity change, appetite change and unexpected weight change.  HENT: Negative for mouth sores and sinus pressure.   Eyes: Negative for visual disturbance.  Respiratory: Negative for chest tightness.   Genitourinary: Negative for frequency, difficulty urinating and vaginal pain.  Musculoskeletal: Negative for back pain and gait problem.  Skin: Negative for pallor.  Neurological: Negative for dizziness and tremors.  Psychiatric/Behavioral: Positive for sleep disturbance. Negative for suicidal ideas and confusion. The patient is nervous/anxious.        Objective:   Physical Exam  Constitutional: She appears well-developed. No distress.  HENT:  Head: Normocephalic.  Right Ear: External ear normal.  Left Ear: External ear normal.  Nose: Nose normal.  Mouth/Throat: Oropharynx is clear and moist.  Eyes: Conjunctivae are normal. Pupils are equal, round, and reactive to light. Right eye exhibits no discharge. Left eye exhibits no discharge.  Neck: Normal range of motion. Neck supple. No JVD present. No tracheal deviation present. No thyromegaly present.  Cardiovascular: Normal rate, regular rhythm and normal heart sounds.   Pulmonary/Chest: No stridor. No respiratory distress. She has no wheezes.  Abdominal: Soft. Bowel sounds are normal. She exhibits no distension and no mass. There is no tenderness. There is no rebound and no guarding.  Musculoskeletal: She exhibits no edema and no tenderness.   Lymphadenopathy:    She has no cervical adenopathy.  Neurological: She displays normal reflexes. No cranial nerve deficit. She exhibits normal muscle tone. Coordination normal.  Skin: No rash noted. No erythema.  Psychiatric: She has a normal mood and affect. Her behavior is normal. Judgment and thought content normal.  sad  L ankle is swollen and tender  Lab Results  Component Value Date   WBC 6.2 03/01/2010   HGB 11.1* 03/01/2010   HCT 33.1* 03/01/2010   PLT 244.0 03/01/2010   GLUCOSE 118* 04/30/2012   CHOL 215* 12/25/2011   TRIG 65.0 12/25/2011   HDL 57.70 12/25/2011   LDLDIRECT 158.8 12/25/2011   LDLCALC 146* 10/12/2008   ALT 28 12/25/2011   AST 26 12/25/2011   NA 142 04/30/2012   K 4.4 04/30/2012   CL 109 04/30/2012   CREATININE 0.8 04/30/2012   BUN 15 04/30/2012   CO2 26 04/30/2012   TSH 2.31 03/01/2010   HGBA1C 7.8* 04/30/2012         Assessment & Plan:

## 2012-05-26 ENCOUNTER — Encounter: Payer: Self-pay | Admitting: Internal Medicine

## 2012-06-04 ENCOUNTER — Other Ambulatory Visit: Payer: Self-pay | Admitting: *Deleted

## 2012-06-04 MED ORDER — PIOGLITAZONE HCL 30 MG PO TABS: 30.0000 mg | ORAL_TABLET | Freq: Every day | ORAL | Status: DC

## 2012-06-04 MED ORDER — GLIMEPIRIDE 2 MG PO TABS: 2.0000 mg | ORAL_TABLET | Freq: Two times a day (BID) | ORAL | Status: DC

## 2012-06-11 ENCOUNTER — Ambulatory Visit: Payer: 59 | Admitting: Internal Medicine

## 2012-07-10 ENCOUNTER — Other Ambulatory Visit: Payer: Self-pay | Admitting: Internal Medicine

## 2012-07-23 ENCOUNTER — Other Ambulatory Visit: Payer: Self-pay | Admitting: Internal Medicine

## 2012-08-19 ENCOUNTER — Other Ambulatory Visit: Payer: Self-pay | Admitting: Internal Medicine

## 2012-08-19 DIAGNOSIS — Z23 Encounter for immunization: Secondary | ICD-10-CM

## 2012-08-20 ENCOUNTER — Ambulatory Visit (INDEPENDENT_AMBULATORY_CARE_PROVIDER_SITE_OTHER): Payer: 59 | Admitting: *Deleted

## 2012-08-20 DIAGNOSIS — Z23 Encounter for immunization: Secondary | ICD-10-CM

## 2012-08-20 DIAGNOSIS — Z2911 Encounter for prophylactic immunotherapy for respiratory syncytial virus (RSV): Secondary | ICD-10-CM

## 2012-09-03 ENCOUNTER — Ambulatory Visit: Payer: 59 | Admitting: Internal Medicine

## 2012-09-16 ENCOUNTER — Ambulatory Visit: Payer: 59 | Admitting: Internal Medicine

## 2012-09-18 ENCOUNTER — Other Ambulatory Visit (INDEPENDENT_AMBULATORY_CARE_PROVIDER_SITE_OTHER): Payer: 59

## 2012-09-18 DIAGNOSIS — E785 Hyperlipidemia, unspecified: Secondary | ICD-10-CM

## 2012-09-18 DIAGNOSIS — E669 Obesity, unspecified: Secondary | ICD-10-CM

## 2012-09-18 DIAGNOSIS — R252 Cramp and spasm: Secondary | ICD-10-CM

## 2012-09-18 DIAGNOSIS — E119 Type 2 diabetes mellitus without complications: Secondary | ICD-10-CM

## 2012-09-18 LAB — BASIC METABOLIC PANEL
BUN: 17 mg/dL (ref 6–23)
Creatinine, Ser: 0.9 mg/dL (ref 0.4–1.2)
GFR: 84.77 mL/min (ref >60.00)
Glucose, Bld: 136 mg/dL — ABNORMAL HIGH (ref 70–99)

## 2012-09-18 LAB — LIPID PANEL: Cholesterol: 201 mg/dL — ABNORMAL HIGH (ref 0–200)

## 2012-09-22 ENCOUNTER — Ambulatory Visit (INDEPENDENT_AMBULATORY_CARE_PROVIDER_SITE_OTHER): Payer: 59 | Admitting: Internal Medicine

## 2012-09-22 ENCOUNTER — Encounter: Payer: Self-pay | Admitting: Internal Medicine

## 2012-09-22 VITALS — BP 130/90 | HR 80 | Temp 97.4°F | Resp 16 | Wt 262.0 lb

## 2012-09-22 DIAGNOSIS — M25569 Pain in unspecified knee: Secondary | ICD-10-CM

## 2012-09-22 DIAGNOSIS — E785 Hyperlipidemia, unspecified: Secondary | ICD-10-CM

## 2012-09-22 DIAGNOSIS — M25561 Pain in right knee: Secondary | ICD-10-CM

## 2012-09-22 DIAGNOSIS — E119 Type 2 diabetes mellitus without complications: Secondary | ICD-10-CM

## 2012-09-22 DIAGNOSIS — F4321 Adjustment disorder with depressed mood: Secondary | ICD-10-CM

## 2012-09-22 DIAGNOSIS — E669 Obesity, unspecified: Secondary | ICD-10-CM

## 2012-09-22 DIAGNOSIS — I10 Essential (primary) hypertension: Secondary | ICD-10-CM

## 2012-09-22 MED ORDER — TRAMADOL HCL 50 MG PO TABS: 50.0000 mg | ORAL_TABLET | Freq: Two times a day (BID) | ORAL | Status: DC | PRN

## 2012-09-22 NOTE — Assessment & Plan Note (Signed)
Continue with current prescription therapy as reflected on the Med list.  

## 2012-09-22 NOTE — Assessment & Plan Note (Signed)
Cont w/diet 

## 2012-09-22 NOTE — Assessment & Plan Note (Signed)
Discussed.

## 2012-09-22 NOTE — Assessment & Plan Note (Addendum)
Likely [facility] cons Dr Lequita Halt

## 2012-09-22 NOTE — Addendum Note (Signed)
Addended by: Merrilyn Puma on: 09/22/2012 08:45 AM   Modules accepted: Orders

## 2012-09-22 NOTE — Assessment & Plan Note (Signed)
Wt Readings from Last 3 Encounters:  09/22/12 262 lb (118.842 kg)  05/06/12 259 lb (117.482 kg)  01/03/12 261 lb (118.389 kg)

## 2012-09-22 NOTE — Progress Notes (Signed)
   Subjective:    HPI The patient presents for a follow-up of  chronic hypertension, chronic dyslipidemia, type 2 diabetes controlled with medicines. C/o low CBGs 2 h after breakfast...55-65 - not better... C/o LE cramps B - chronic C/o R knee pain x months  Wt Readings from Last 3 Encounters:  09/22/12 262 lb (118.842 kg)  05/06/12 259 lb (117.482 kg)  01/03/12 261 lb (118.389 kg)   BP Readings from Last 3 Encounters:  09/22/12 130/90  05/06/12 130/78  01/03/12 146/80       Review of Systems  Constitutional: Negative for activity change, appetite change and unexpected weight change.  HENT: Negative for mouth sores and sinus pressure.   Eyes: Negative for visual disturbance.  Respiratory: Negative for chest tightness.   Genitourinary: Negative for frequency, difficulty urinating and vaginal pain.  Musculoskeletal: Negative for back pain and gait problem.  Skin: Negative for pallor.  Neurological: Negative for dizziness and tremors.  Psychiatric/Behavioral: Positive for sleep disturbance. Negative for suicidal ideas and confusion. The patient is nervous/anxious.        Objective:   Physical Exam  Constitutional: She appears well-developed. No distress.  HENT:  Head: Normocephalic.  Right Ear: External ear normal.  Left Ear: External ear normal.  Nose: Nose normal.  Mouth/Throat: Oropharynx is clear and moist.  Eyes: Conjunctivae are normal. Pupils are equal, round, and reactive to light. Right eye exhibits no discharge. Left eye exhibits no discharge.  Neck: Normal range of motion. Neck supple. No JVD present. No tracheal deviation present. No thyromegaly present.  Cardiovascular: Normal rate, regular rhythm and normal heart sounds.   Pulmonary/Chest: No stridor. No respiratory distress. She has no wheezes.  Abdominal: Soft. Bowel sounds are normal. She exhibits no distension and no mass. There is no tenderness. There is no rebound and no guarding.  Musculoskeletal:  She exhibits no edema and no tenderness.  Lymphadenopathy:    She has no cervical adenopathy.  Neurological: She displays normal reflexes. No cranial nerve deficit. She exhibits normal muscle tone. Coordination normal.  Skin: No rash noted. No erythema.  Psychiatric: She has a normal mood and affect. Her behavior is normal. Judgment and thought content normal.  sad  R knee is tender w/ROM  Lab Results  Component Value Date   WBC 6.2 03/01/2010   HGB 11.1* 03/01/2010   HCT 33.1* 03/01/2010   PLT 244.0 03/01/2010   GLUCOSE 136* 09/18/2012   CHOL 201* 09/18/2012   TRIG 57.0 09/18/2012   HDL 51.20 09/18/2012   LDLDIRECT 146.5 09/18/2012   LDLCALC 146* 10/12/2008   ALT 28 12/25/2011   AST 26 12/25/2011   NA 142 09/18/2012   K 4.6 09/18/2012   CL 110 09/18/2012   CREATININE 0.9 09/18/2012   BUN 17 09/18/2012   CO2 25 09/18/2012   TSH 2.31 03/01/2010   HGBA1C 7.7* 09/18/2012         Assessment & Plan:

## 2012-10-09 ENCOUNTER — Ambulatory Visit: Payer: 59 | Admitting: Internal Medicine

## 2012-10-24 LAB — HM DIABETES EYE EXAM

## 2012-11-04 ENCOUNTER — Encounter: Payer: Self-pay | Admitting: Internal Medicine

## 2012-11-04 ENCOUNTER — Ambulatory Visit
Admission: RE | Admit: 2012-11-04 | Discharge: 2012-11-04 | Disposition: A | Discharge status: Home / Self Care | Payer: 59 | Source: Ambulatory Visit | Attending: Internal Medicine | Admitting: Internal Medicine

## 2012-11-04 ENCOUNTER — Ambulatory Visit (INDEPENDENT_AMBULATORY_CARE_PROVIDER_SITE_OTHER): Payer: 59 | Admitting: Internal Medicine

## 2012-11-04 VITALS — BP 126/78 | HR 72 | Temp 97.8°F | Resp 12 | Ht 63.75 in | Wt 262.0 lb

## 2012-11-04 DIAGNOSIS — E049 Nontoxic goiter, unspecified: Secondary | ICD-10-CM | POA: Insufficient documentation

## 2012-11-04 DIAGNOSIS — E119 Type 2 diabetes mellitus without complications: Secondary | ICD-10-CM

## 2012-11-04 DIAGNOSIS — E785 Hyperlipidemia, unspecified: Secondary | ICD-10-CM

## 2012-11-04 DIAGNOSIS — I10 Essential (primary) hypertension: Secondary | ICD-10-CM

## 2012-11-04 MED ORDER — SITAGLIPTIN PHOSPHATE 100 MG PO TABS: 100.0000 mg | ORAL_TABLET | Freq: Every day | ORAL | Status: DC

## 2012-11-04 NOTE — Patient Instructions (Addendum)
Please return in 1.5 months with your sugar log. Check sugars 2-3 times a day, rotating check times. Please stop the Amaryl and Actos. Please start Januvia 100 mg in am, before breakfast.  We will try to schedule the thyroid U/S today.  PATIENT INSTRUCTIONS FOR TYPE 2 DIABETES:  **Please join MyChart!** - see attached instructions about how to join   DIET AND EXERCISE Diet and exercise is an important part of diabetic treatment.  We recommended aerobic exercise in the form of brisk walking (working between 40-60% of maximal aerobic capacity, similar to brisk walking) for 150 minutes per week (such as 30 minutes five days per week) along with 3 times per week performing 'resistance' training (using various gauge rubber tubes with handles) 5-10 exercises involving the major muscle groups (upper body, lower body and core) performing 10-15 repetitions (or near fatigue) each exercise. Start at half the above goal but build slowly to reach the above goals. If limited by weight, joint pain, or disability, we recommend daily walking in a swimming pool with water up to waist to reduce pressure from joints while allow for adequate exercise.    BLOOD GLUCOSES Monitoring your blood glucoses is important for continued management of your diabetes. Please check your blood glucoses 2-4 times a day: fasting, before meals and at bedtime (you can rotate these measurements - e.g. one day check before the 3 meals, the next day check before 2 of the meals and before bedtime, etc.   HYPOGLYCEMIA (low blood sugar) Hypoglycemia is usually a reaction to not eating, exercising, or taking too much insulin/ other diabetes drugs.  Symptoms include tremors, sweating, hunger, confusion, headache, etc. Treat IMMEDIATELY with 15 grams of Carbs:   4 glucose tablets    cup regular juice/soda   2 tablespoons raisins   4 teaspoons sugar   1 tablespoon honey Recheck blood glucose in 15 mins and repeat above if still  symptomatic/blood glucose <100. Please contact our office at [phone] if you have questions about how to next handle your insulin.  RECOMMENDATIONS TO REDUCE YOUR RISK OF DIABETIC COMPLICATIONS: * Take your prescribed MEDICATION(S). * Follow a DIABETIC diet: Complex carbs, fiber rich foods, heart healthy fish twice weekly, (monounsaturated and polyunsaturated) fats * AVOID saturated/trans fats, high fat foods, >2,300 mg salt per day. * EXERCISE at least 5 times a week for 30 minutes or preferably daily.  * DO NOT SMOKE OR DRINK more than 1 drink a day. * Check your FEET every day. Do not wear tightfitting shoes. Contact us if you develop an ulcer * See your EYE doctor once a year or more if needed * Get a FLU shot once a year * Get a PNEUMONIA vaccine once before and once after age 102 years  GOALS:  * Your Hemoglobin A1c of <7%  * fasting sugars need to be <130 * after meals sugars need to be <180 (2h after you start eating) * Your Systolic BP should be 140 or lower  * Your Diastolic BP should be 80 or lower  * Your HDL (Good Cholesterol) should be 40 or higher  * Your LDL (Bad Cholesterol) should be 100 or lower  * Your Triglycerides should be 150 or lower  * Your Urine microalbumin (kidney function) should be <30 * Your Body Mass Index should be 25 or lower   We will be glad to help you achieve these goals. Our telephone number is: [phone].

## 2012-11-04 NOTE — Progress Notes (Signed)
Patient ID: Katelyn Peterson, female   DOB: 30-Jul-1945, 67 y.o.   MRN: [id number]  HPI: Katelyn Peterson is a 67 y.o.-year-old female, referred by her PCP, Dr. Posey Rea, for management of DM2, non-insulin-dependent, uncontrolled, without complications.   At last visit with her PCP, on 09/22/2012, she complained of lows after breakfast: 55-65.  Patient has been diagnosed with diabetes in ~2000; she has not been on insulin before. Last hemoglobin A1c was: Lab Results  Component Value Date   HGBA1C 7.7* 09/18/2012  previous values have been 7.8% and 6.9%.  Pt is on a regimen of: - Metformin 1000 mg po bid - Amaryl 2 mg bid - Actos 30 mg daily She takes the med after she eats. If she takes them before she eats, she will drop her sugars.   Pt checks her sugars 2x a day and they are: - am: 90-110 - before lunch: 57-60s, despite eating a small snack before - 2h after lunch: ~110 - before dinner: ~120 - bedtime: not checking, but not feeling hypoglycemic Lowest sugar was 48 in last few months (between 10-12 in am); she has hypoglycemia awareness at 60. Highest sugar was 150.  Pt's meals are: - Breakfast: pack a nabs or peanuts + diet coke or lemon water - Lunch: sandwich or full course meal, but not a large portion; salad - Dinner: fish, no starches + vegetables - Snacks: peanuts or a nab, craisins or dark chocolate blueberries  - no CKD, last BUN/creatinine:  Lab Results  Component Value Date   BUN 17 09/18/2012   CREATININE 0.9 09/18/2012  she is on quinapril 44.  - last set of lipids: Lab Results  Component Value Date   CHOL 201* 09/18/2012   HDL 51.20 09/18/2012   LDLCALC 146* 10/12/2008   LDLDIRECT 146.5 09/18/2012   TRIG 57.0 09/18/2012   CHOLHDL 4 09/18/2012  she is not on a statin. She developed leg cramps with pravastatin and atorvastatin.  - last eye exam was 10/24/2012. No DR.  - no numbness and tingling in her feet.  I reviewed her chart and she also has a history of  hypertension (should be on spironolactone, verapamil, quinapril - but did not take them in several days as the BP is "never high"!!!), hyperlipidemia, obesity-has been recommended Belviq, but too expensive. Per notes in the chart, she has been considered for lap band surgery. She also has bilateral chronic lower extremity cramps, right knee pain, anemia-last hemoglobin 11.1 in 02/2010. Last TSH was 2.31 on 02/2010.  Pt has FH of DM in her mother, sister.  ROS: Constitutional: no weight gain/loss, no fatigue, no subjective hyperthermia/hypothermia Eyes: no blurry vision, no xerophthalmia ENT: no sore throat, no nodules palpated in throat, no dysphagia/odynophagia, no hoarseness Cardiovascular: no CP/SOB/palpitations/+ leg swelling L>R - 2/2 left knee pain Respiratory: no cough/SOB Gastrointestinal: no N/V/D/C Musculoskeletal: no muscle/+ joint aches and swelling (left knee) Skin: no rashes Neurological: no tremors/numbness/tingling/dizziness Psychiatric: no depression/anxiety  Past Medical History  Diagnosis Date  . Hyperlipidemia   . HTN (hypertension)   . Type II or unspecified type diabetes mellitus without mention of complication, not stated as uncontrolled   . Obesity    Past Surgical History  Procedure Laterality Date  . Abdominal hysterectomy     History   Social History  . Marital Status: Married    Spouse Name: N/A    Number of Children: 2   Occupational History  . [employer]    Social History Main Topics  .  Smoking status: Never Smoker   . Smokeless tobacco: Not on file  . Alcohol Use: No  . Drug Use: No  . Sexual Activity: Yes    Partners: Male   Social History Narrative   Regular exercise: seldom   Caffeine use: occasionally   Current Outpatient Prescriptions on File Prior to Visit  Medication Sig Dispense Refill  . Cholecalciferol 1000 UNITS tablet Take 1,000 Units by mouth daily.        [name] glimepiride (AMARYL) 2 MG tablet Take 1 tablet (2 mg  total) by mouth 2 (two) times daily.  180 tablet  3  . ibuprofen (ADVIL,MOTRIN) 600 MG tablet TAKE 1 TABLET BY MOUTH TWICE DAILY AFTER MEALS FOR 1 WEEK, THEN AS NEEDED FOR PAIN  60 tablet  2  . Lorcaserin HCl (BELVIQ) 10 MG TABS Take 1 tablet by mouth 2 (two) times daily.  60 tablet  3  . metFORMIN (GLUCOPHAGE) 1000 MG tablet Take 1 tablet (1,000 mg total) by mouth 2 (two) times daily with a meal.  180 tablet  3  . pioglitazone (ACTOS) 30 MG tablet Take 1 tablet (30 mg total) by mouth daily.  90 tablet  3  . quinapril (ACCUPRIL) 40 MG tablet TAKE 1 TABLET (40 MG TOTAL) BY MOUTH DAILY.  90 tablet  2  . spironolactone (ALDACTONE) 50 MG tablet TAKE 1 TABLET BY MOUTH DAILY  90 tablet  1  . traMADol (ULTRAM) 50 MG tablet Take 1-2 tablets (50-100 mg total) by mouth 2 (two) times daily as needed for pain.  100 tablet  2  . verapamil (VERELAN PM) 240 MG 24 hr capsule Take 1 capsule (240 mg total) by mouth at bedtime.  90 capsule  2   No current facility-administered medications on file prior to visit.   Allergies  Allergen Reactions  . Atorvastatin     REACTION: cramps  . Hydrochlorothiazide     REACTION: Cramps  . Pravastatin     cramps   Family History  Problem Relation Age of Onset  . Hypertension Other   . Hypertension Mother   . Diabetes Mother   . Hypertension Father    PE: BP 126/78  Pulse 72  Temp(Src) 97.8 F (36.6 C) (Oral)  Resp 12  Ht 5' 3.75" (1.619 m)  Wt 262 lb (118.842 kg)  BMI 45.34 kg/m2  SpO2 98% Wt Readings from Last 3 Encounters:  11/04/12 262 lb (118.842 kg)  09/22/12 262 lb (118.842 kg)  05/06/12 259 lb (117.482 kg)   Constitutional: obese, in NAD Eyes: PERRLA, EOMI, no exophthalmos ENT: moist mucous membranes, slight thyromegaly vs anterior cervical fat deposit, no cervical lymphadenopathy Cardiovascular: RRR, No MRG, + dense bilateral lower systemic edema, L>R Respiratory: CTA B Gastrointestinal: abdomen soft, NT, ND, BS+ Musculoskeletal: no  deformities, strength intact in all 4 Skin: moist, warm, no rashes Neurological: no tremor with outstretched hands, DTR normal in all 4  ASSESSMENT: 1. DM2, non-insulin-dependent, uncontrolled, with short term complications (hypoglycemia)  2. Hyperlipidemia - Developed leg cramps with pravastatin and atorvastatin - has not tried Crestor or fluvastatin  3. Hypertension - She is on and off her medicines, as she notices that her blood pressure is contains normal off her blood pressure medicine  4. Goiter - I can feel an enlarged thyroid on exam, questionable right-sided thyroid nodule versus enlarged right lobe  PLAN:  1. DM 2 Patient with long-standing, recently more uncontrolled diabetes, on oral antidiabetic regimen, which became inappropriate for her since it is  causing low blood sugars. The hypoglycemic episodes are mostly in the morning, and she has to eat to keep up with her medication. I discussed with her that I believe that Amaryl is the most likely culprit for her low blood sugars, and we decided to stop the medication. Also, we discussed about  Actos and revised the side effects, including fluid retention, weight gain, bladder cancer, osteoporosis. For the above reasons, I suggested that she comes off this medication, too, to which she agrees completely, as she also heard about these side effects. - I believe that coming off the 2 medicines above will cause her to lose some weight - We discussed about options for treatment, and I suggested to:  Continue metformin at 1000 mg twice a day Start Januvia at the 100 mg daily - If Januvia is not enough, we can consider Victoza, Invokana or Cycloset - Strongly advised her to continue checking her sugars at different times of the day - check 2-3 times a day, rotating checks. Ideally, would also have some bedtime blood sugars. - given sugar log and advised how to fill it and to bring it at next appt  - given foot care handout and explained  the principles  - given instructions for hypoglycemia management "15-15 rule"  - advised for yearly eye exams, which she does have - advised her to join my chart - Return to clinic in 1.5 months with sugar log   2. Hyperlipidemia - I wonder if she would benefit from a low-dose Crestor (even taken once a week if not more often), or fluvastatin. She can also add coenzyme Q to decrease the intensity of the muscle cramps.  3. Hypertension - Blood pressure today completely normal at 126/78 after several days off her 3 BP medicines, will let Dr. Posey Rea know  4. Goiter - Last TSH was normal last month - ?enlarged thyroid, ?nodule - denies dysphagia, hoarseness, or any nodules felt in the neck - I discussed with her and will check a thyroid ultrasound today  *RADIOLOGY REPORT*  Clinical Data: Thyroid goiter.  THYROID ULTRASOUND  Technique: Ultrasound examination of the thyroid gland and adjacent soft tissues was performed.  Comparison: None.  Findings:  Right thyroid lobe: 4.3 x 1.0 x 1.8 cm Left thyroid lobe: 4.6 x 1.2 x 1.9 cm Isthmus: 5 mm  Focal nodules: Thyroid echotexture is homogeneous, without solid or cystic lesion.  Lymphadenopathy: None visualized.  IMPRESSION: Normal thyroid ultrasound. No evidence of thyromegaly or dominant solid/cystic mass.  Original Report Authenticated By: Jeronimo Greaves, M.D.  Pt informed (left VM).

## 2012-12-01 ENCOUNTER — Other Ambulatory Visit: Payer: Self-pay | Admitting: Internal Medicine

## 2012-12-23 ENCOUNTER — Encounter: Payer: Self-pay | Admitting: Internal Medicine

## 2012-12-23 ENCOUNTER — Ambulatory Visit (INDEPENDENT_AMBULATORY_CARE_PROVIDER_SITE_OTHER): Payer: 59 | Admitting: Internal Medicine

## 2012-12-23 VITALS — BP 132/70 | HR 93 | Temp 97.6°F | Resp 12 | Wt 251.0 lb

## 2012-12-23 DIAGNOSIS — E119 Type 2 diabetes mellitus without complications: Secondary | ICD-10-CM

## 2012-12-23 LAB — HEMOGLOBIN A1C: Hgb A1c MFr Bld: 7.5 % — ABNORMAL HIGH (ref 4.6–6.5)

## 2012-12-23 NOTE — Progress Notes (Signed)
Patient ID: Katelyn Peterson, female   DOB: [birth year], 67 y.o.   MRN: [id number]  HPI: Katelyn Peterson is a 67 y.o.-year-old female, returning for f/u for DM2, dx 2000, non-insulin-dependent, uncontrolled, without complications. Last visit 1.5 mo ago.  Last hemoglobin A1c was: Lab Results  Component Value Date   HGBA1C 7.7* 09/18/2012  previous values have been 7.8% and 6.9%.  Pt is on a regimen of: - Metformin 1000 mg po bid - Januvia 100 mg qd, added at last visit We stopped Amaryl 2 mg bid and Actos 30 mg daily for daily hypoglycemia events and possible SEs, respectively.  Pt checks her sugars 2x a day and they are radically improved: - am: 90-110 >> 91-133 - before lunch: 57-60s, despite eating a small snack before >> 85-133 - 2h after lunch: ~110 >> only one check 142 - before dinner: ~120 >> 78-127 - 2h after dinner: 138 - bedtime: not checking, but not feeling hypoglycemic >> 100-120 Lowest sugar was 85;  she has hypoglycemia awareness at 60. Highest sugar was 154.  - no CKD, last BUN/creatinine:  Lab Results  Component Value Date   BUN 17 09/18/2012   CREATININE 0.9 09/18/2012  she is on quinapril 44.  - last set of lipids: Lab Results  Component Value Date   CHOL 201* 09/18/2012   HDL 51.20 09/18/2012   LDLCALC 146* 10/12/2008   LDLDIRECT 146.5 09/18/2012   TRIG 57.0 09/18/2012   CHOLHDL 4 09/18/2012  she is not on a statin. She developed leg cramps with pravastatin and atorvastatin.  - last eye exam was 10/24/2012. No DR.  - no numbness and tingling in her feet.  She also has a history of hypertension, hyperlipidemia, obesity-has been recommended Belviq, but too expensive. She has been considered for lap band surgery. She also has bilateral chronic lower extremity cramps, right knee pain, anemia.  ROS: Constitutional: no weight gain/loss, no fatigue, no subjective hyperthermia/hypothermia Eyes: no blurry vision, no xerophthalmia ENT: no sore throat, no nodules  palpated in throat, no dysphagia/odynophagia, no hoarseness Cardiovascular: no CP/SOB/palpitations/leg swelling Respiratory: no cough/SOB Gastrointestinal: no N/V/D/C Musculoskeletal: no muscle/no joint aches and swelling (left knee) Skin: no rashes Neurological: no tremors/numbness/tingling/dizziness  I reviewed pt's medications, allergies, PMH, social hx, family hx and no changes required, except as mentioned above.  PE: Pulse 93  Temp(Src) 97.6 F (36.4 C) (Oral)  Resp 12  Wt 251 lb (113.853 kg)  BMI 43.44 kg/m2  SpO2 98% Wt Readings from Last 3 Encounters:  12/23/12 251 lb (113.853 kg)  11/04/12 262 lb (118.842 kg)  09/22/12 262 lb (118.842 kg)   Constitutional: obese, in NAD Eyes: PERRLA, EOMI, no exophthalmos ENT: moist mucous membranes, slight thyromegaly vs anterior cervical fat deposit, no cervical lymphadenopathy Cardiovascular: RRR, No MRG, + dense bilateral lower systemic edema, L>R Respiratory: CTA B Gastrointestinal: abdomen soft, NT, ND, BS+ Musculoskeletal: no deformities, strength intact in all 4 Skin: moist, warm, no rashes Neurological: no tremor with outstretched hands, DTR normal in all 4  ASSESSMENT: 1. DM2, non-insulin-dependent, now with improved control, with short term complications (hypoglycemia)  2. Hyperlipidemia - Developed leg cramps with pravastatin and atorvastatin - has not tried Crestor or fluvastatin  3. Hypertension - She is on and off her medicines, as she notices that her blood pressure is contains normal off her blood pressure medicine  PLAN:  1. DM 2 Patient with long-standing, recently more uncontrolled diabetes, on oral antidiabetic regimen, previously with hypoglycemia, now greatly improved control (upon stopping  sulfonylurea and starting a DPP4 inhibitor), with almost all CBGs at goal regardless of the time of the day she checks. She lost 11 lbs per our scale from last visit! I congratulated the pt for this achievement. She  mentions that she did not change her diet or exercise pattern since last visit. She can tolerate by mouth well. - I suggested to:  Continue metformin at 1000 mg twice a day Continue Januvia at the 100 mg daily - continue checking her sugars at different times of the day - check 2 times a day, rotating checks. 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 Hyperlipidemia - not addressed today - I wonder if she would benefit from a low-dose Crestor (even taken once a week if not more often), or fluvastatin. She can also add coenzyme Q to decrease the intensity of the muscle cramps.   3. Hypertension - Blood pressure today again normal at 132/70   Office Visit on 12/23/2012  Component Date Value Range Status  . Hemoglobin A1C 12/23/2012 7.5* 4.6 - 6.5 % Final   Glycemic Control Guidelines for People with Diabetes:Non Diabetic:  <6%Goal of Therapy: <7%Additional Action Suggested:  >8%    I am surprised that the HbA1c not better... Continue current regimen.

## 2012-12-23 NOTE — Patient Instructions (Addendum)
Please return in 3 months with your sugar log.  GREAT JOB!

## 2013-01-27 ENCOUNTER — Ambulatory Visit: Payer: 59 | Admitting: Internal Medicine

## 2013-03-26 ENCOUNTER — Ambulatory Visit: Payer: 59 | Admitting: Internal Medicine

## 2013-03-30 ENCOUNTER — Other Ambulatory Visit: Payer: Self-pay | Admitting: Internal Medicine

## 2013-04-02 ENCOUNTER — Encounter: Payer: Self-pay | Admitting: Internal Medicine

## 2013-04-02 ENCOUNTER — Ambulatory Visit (INDEPENDENT_AMBULATORY_CARE_PROVIDER_SITE_OTHER): Payer: 59 | Admitting: Internal Medicine

## 2013-04-02 VITALS — BP 122/72 | HR 79 | Temp 98.0°F | Resp 12 | Wt 239.0 lb

## 2013-04-02 DIAGNOSIS — IMO0001 Reserved for inherently not codable concepts without codable children: Secondary | ICD-10-CM

## 2013-04-02 DIAGNOSIS — E1165 Type 2 diabetes mellitus with hyperglycemia: Principal | ICD-10-CM

## 2013-04-02 LAB — HEMOGLOBIN A1C: Hgb A1c MFr Bld: 7.5 % — ABNORMAL HIGH (ref 4.6–6.5)

## 2013-04-02 NOTE — Progress Notes (Signed)
Patient ID: Katelyn Peterson, female   DOB: 08/29/1945, 68 y.o.   MRN: [id number]  HPI: Katelyn Peterson is a 68 y.o.-year-old female, returning for f/u for DM2, dx 2000, non-insulin-dependent, uncontrolled, without complications. Last visit 3 mo ago.  Last hemoglobin A1c was: Lab Results  Component Value Date   HGBA1C 7.5* 12/23/2012   HGBA1C 7.7* 09/18/2012   HGBA1C 7.8* 04/30/2012   Pt is on a regimen of: - Metformin 1000 mg po bid - Januvia 100 mg qd We stopped Amaryl 2 mg bid and Actos 30 mg daily for daily hypoglycemia events and possible SEs, respectively.  Pt checks her sugars 2x a day and they are at goal: - am: 90-110 >> 91-133 >> 120s - before lunch: 57-60s, despite eating a small snack before >> 85-133 >> n/c - 2h after lunch: ~110 >> only one check 142 >> 120-125 - before dinner: ~120 >> 78-127 >> n/c - 2h after dinner: 138 >> n/c - bedtime: not checking, but not feeling hypoglycemic >> 100-120 >> n/c Lowest sugar was 92;  she has hypoglycemia awareness at 60. Highest sugar was 159.  - no CKD, last BUN/creatinine:  Lab Results  Component Value Date   BUN 17 09/18/2012   CREATININE 0.9 09/18/2012  she is on quinapril 44.  - last set of lipids: Lab Results  Component Value Date   CHOL 201* 09/18/2012   HDL 51.20 09/18/2012   LDLCALC 146* 10/12/2008   LDLDIRECT 146.5 09/18/2012   TRIG 57.0 09/18/2012   CHOLHDL 4 09/18/2012  she is not on a statin. She developed leg cramps with pravastatin and atorvastatin.  - last eye exam was 10/24/2012. No DR.  - no numbness and tingling in her feet.  She also has a history of hypertension, hyperlipidemia, obesity-has been recommended Belviq, but too expensive. She has been considered for lap band surgery. She also has bilateral chronic lower extremity cramps, right knee pain, anemia.  ROS: Constitutional: no weight gain/loss, no fatigue, no subjective hyperthermia/hypothermia Eyes: no blurry vision, no xerophthalmia ENT: no sore  throat, no nodules palpated in throat, no dysphagia/odynophagia, no hoarseness Cardiovascular: no CP/SOB/palpitations/leg swelling Respiratory: no cough/SOB Gastrointestinal: no N/V/D/C Musculoskeletal: no muscle/no joint aches and swelling (left knee) Skin: no rashes Neurological: no tremors/numbness/tingling/dizziness  I reviewed pt's medications, allergies, PMH, social hx, family hx and no changes required, except as mentioned above.  PE: BP 122/72  Pulse 79  Temp(Src) 98 F (36.7 C) (Oral)  Resp 12  Wt 239 lb (108.41 kg)  SpO2 99% Wt Readings from Last 3 Encounters:  04/02/13 239 lb (108.41 kg)  12/23/12 251 lb (113.853 kg)  11/04/12 262 lb (118.842 kg)   Constitutional: obese, in NAD Eyes: PERRLA, EOMI, no exophthalmos ENT: moist mucous membranes, slight thyromegaly vs anterior cervical fat deposit, no cervical lymphadenopathy Cardiovascular: RRR, No MRG, + dense bilateral lower systemic edema, L>R Respiratory: CTA B Gastrointestinal: abdomen soft, NT, ND, BS+ Musculoskeletal: no deformities, strength intact in all 4 Skin: moist, warm, no rashes  ASSESSMENT: 1. DM2, non-insulin-dependent, now with improved control, with short term complications (hypoglycemia)  2. Hyperlipidemia - Developed leg cramps with pravastatin and atorvastatin - has not tried Crestor or fluvastatin  PLAN:  1. DM 2 Patient with long-standing, recently more controlled diabetes, on oral antidiabetic regimen, previously with hypoglycemia, now greatly improved control (upon stopping sulfonylurea and starting a DPP4 inhibitor), with almost all CBGs at goal regardless of the time of the day she checks. - I suggested to:  Continue  metformin at 1000 mg twice a day Continue Januvia at the 100 mg daily - continue checking her sugars at different times of the day - check 2 times a day, rotating checks. I gave her a sugar log and advised her to check at bedtime and during the weekend, too. - had the flu  vaccine through work [employer], works at [employer]) - Will check hemoglobin A1c today - Return to clinic in 3 months with sugar log   2. Hyperlipidemia - not on statins  Office Visit on 04/02/2013  Component Date Value Range Status  . Hemoglobin A1C 04/02/2013 7.5* 4.6 - 6.5 % Final   Glycemic Control Guidelines for People with Diabetes:Non Diabetic:  <6%Goal of Therapy: <7%Additional Action Suggested:  >8%    Sugars at goal per her report, but the HbA1c still >7%. I advised her to start checking at home at # times of the day, especially bedtime. Stay on same regimen for now.

## 2013-04-02 NOTE — Patient Instructions (Signed)
Please continue the current diabetes regimen. Please stop at the lab.

## 2013-04-10 ENCOUNTER — Other Ambulatory Visit: Payer: Self-pay | Admitting: Internal Medicine

## 2013-04-28 ENCOUNTER — Encounter: Payer: 59 | Admitting: Internal Medicine

## 2013-05-12 ENCOUNTER — Ambulatory Visit (INDEPENDENT_AMBULATORY_CARE_PROVIDER_SITE_OTHER): Payer: 59 | Admitting: Internal Medicine

## 2013-05-12 ENCOUNTER — Encounter: Payer: Self-pay | Admitting: Internal Medicine

## 2013-05-12 VITALS — BP 140/84 | HR 83 | Temp 98.1°F | Ht 64.5 in | Wt 235.0 lb

## 2013-05-12 DIAGNOSIS — Z0000 Encounter for general adult medical examination without abnormal findings: Secondary | ICD-10-CM

## 2013-05-12 DIAGNOSIS — E1169 Type 2 diabetes mellitus with other specified complication: Secondary | ICD-10-CM

## 2013-05-12 DIAGNOSIS — E119 Type 2 diabetes mellitus without complications: Secondary | ICD-10-CM

## 2013-05-12 DIAGNOSIS — E669 Obesity, unspecified: Secondary | ICD-10-CM

## 2013-05-12 NOTE — Progress Notes (Signed)
   Subjective:    HPI  The patient is here for a wellness exam. The patient has been doing well overall without major physical or psychological issues going on lately.  The patient presents for a follow-up of  chronic hypertension, chronic dyslipidemia, type 2 diabetes controlled with medicines.   F/u LE cramps B - chronic   Wt Readings from Last 3 Encounters:  05/12/13 235 lb (106.595 kg)  04/02/13 239 lb (108.41 kg)  12/23/12 251 lb (113.853 kg)   BP Readings from Last 3 Encounters:  05/12/13 140/84  04/02/13 122/72  12/23/12 132/70       Review of Systems  Constitutional: Negative for activity change, appetite change and unexpected weight change.  HENT: Negative for mouth sores and sinus pressure.   Eyes: Negative for visual disturbance.  Respiratory: Negative for chest tightness.   Genitourinary: Negative for frequency, difficulty urinating and vaginal pain.  Musculoskeletal: Negative for back pain and gait problem.  Skin: Negative for pallor.  Neurological: Negative for dizziness and tremors.  Psychiatric/Behavioral: Positive for sleep disturbance. Negative for suicidal ideas and confusion. The patient is nervous/anxious.        Objective:   Physical Exam  Constitutional: She appears well-developed. No distress.  HENT:  Head: Normocephalic.  Right Ear: External ear normal.  Left Ear: External ear normal.  Nose: Nose normal.  Mouth/Throat: Oropharynx is clear and moist.  Eyes: Conjunctivae are normal. Pupils are equal, round, and reactive to light. Right eye exhibits no discharge. Left eye exhibits no discharge.  Neck: Normal range of motion. Neck supple. No JVD present. No tracheal deviation present. No thyromegaly present.  Cardiovascular: Normal rate, regular rhythm and normal heart sounds.   Pulmonary/Chest: No stridor. No respiratory distress. She has no wheezes.  Abdominal: Soft. Bowel sounds are normal. She exhibits no distension and no mass. There is  no tenderness. There is no rebound and no guarding.  Musculoskeletal: She exhibits no edema and no tenderness.  Lymphadenopathy:    She has no cervical adenopathy.  Neurological: She displays normal reflexes. No cranial nerve deficit. She exhibits normal muscle tone. Coordination normal.  Skin: No rash noted. No erythema.  Psychiatric: She has a normal mood and affect. Her behavior is normal. Judgment and thought content normal.  Not sad    Lab Results  Component Value Date   WBC 6.2 03/01/2010   HGB 11.1* 03/01/2010   HCT 33.1* 03/01/2010   PLT 244.0 03/01/2010   GLUCOSE 136* 09/18/2012   CHOL 201* 09/18/2012   TRIG 57.0 09/18/2012   HDL 51.20 09/18/2012   LDLDIRECT 146.5 09/18/2012   LDLCALC 146* 10/12/2008   ALT 28 12/25/2011   AST 26 12/25/2011   NA 142 09/18/2012   K 4.6 09/18/2012   CL 110 09/18/2012   CREATININE 0.9 09/18/2012   BUN 17 09/18/2012   CO2 25 09/18/2012   TSH 2.31 03/01/2010   HGBA1C 7.5* 04/02/2013         Assessment & Plan:

## 2013-05-12 NOTE — Progress Notes (Deleted)
Pre visit review using our clinic review tool, if applicable. No additional management support is needed unless otherwise documented below in the visit note. 

## 2013-05-14 ENCOUNTER — Telehealth: Payer: Self-pay

## 2013-05-14 NOTE — Telephone Encounter (Signed)
Relevant patient education mailed to patient.  

## 2013-05-22 ENCOUNTER — Other Ambulatory Visit: Payer: Self-pay | Admitting: Family

## 2013-05-22 MED ORDER — AMOXICILLIN 500 MG PO TABS: 1000.0000 mg | ORAL_TABLET | Freq: Two times a day (BID) | ORAL | Status: AC

## 2013-06-15 ENCOUNTER — Encounter: Payer: Self-pay | Admitting: Internal Medicine

## 2013-06-22 ENCOUNTER — Other Ambulatory Visit: Payer: Self-pay

## 2013-06-22 MED ORDER — LORATADINE 10 MG PO TABS: 10.0000 mg | ORAL_TABLET | Freq: Every day | ORAL | Status: DC

## 2013-07-02 ENCOUNTER — Encounter: Payer: Self-pay | Admitting: *Deleted

## 2013-07-02 ENCOUNTER — Encounter: Payer: Self-pay | Admitting: Internal Medicine

## 2013-07-02 ENCOUNTER — Ambulatory Visit (INDEPENDENT_AMBULATORY_CARE_PROVIDER_SITE_OTHER): Payer: 59 | Admitting: Internal Medicine

## 2013-07-02 VITALS — BP 122/74 | HR 78 | Temp 97.5°F | Resp 12 | Wt 228.0 lb

## 2013-07-02 DIAGNOSIS — IMO0001 Reserved for inherently not codable concepts without codable children: Secondary | ICD-10-CM

## 2013-07-02 DIAGNOSIS — E1165 Type 2 diabetes mellitus with hyperglycemia: Principal | ICD-10-CM

## 2013-07-02 LAB — HEMOGLOBIN A1C: HEMOGLOBIN A1C: 7.3 % — AB (ref 4.6–6.5)

## 2013-07-02 NOTE — Patient Instructions (Signed)
Continue metformin at 1000 mg twice a day Continue Januvia at the 100 mg daily  Please stop at the lab.

## 2013-07-02 NOTE — Progress Notes (Signed)
Patient ID: Katelyn Peterson, female   DOB: 27-Jul-1945, 68 y.o.   MRN: [id number]  HPI: Katelyn Peterson is a 68 y.o.-year-old female, returning for f/u for DM2, dx 2000, non-insulin-dependent, uncontrolled, without complications. Last visit 3 mo ago.  Last hemoglobin A1c was: Lab Results  Component Value Date   HGBA1C 7.5* 04/02/2013   HGBA1C 7.5* 12/23/2012   HGBA1C 7.7* 09/18/2012   Pt is on a regimen of: - Metformin 1000 mg po bid - Januvia 100 mg qd We stopped Amaryl 2 mg bid and Actos 30 mg daily for daily hypoglycemia events and possible SEs, respectively.  Pt checks her sugars 2x a day and they are at goal: - am: 90-110 >> 91-133 >> 120s >> n/c >> 100-120 - before lunch: 57-60s, despite eating a small snack before >> 85-133 >> n/c >> 93-140 - 2h after lunch: ~110 >> only one check 142 >> 120-125 >> 81-141 - before dinner: ~120 >> 78-127 >> n/c - 2h after dinner: 138 >> n/c - bedtime: not checking, but not feeling hypoglycemic >> 100-120 >> n/c Lowest sugar was 85;  she has hypoglycemia awareness at 60. Highest sugar was 141  - no CKD, last BUN/creatinine:  Lab Results  Component Value Date   BUN 17 09/18/2012   CREATININE 0.9 09/18/2012  she is on quinapril 44.  - last set of lipids: Lab Results  Component Value Date   CHOL 201* 09/18/2012   HDL 51.20 09/18/2012   LDLCALC 146* 10/12/2008   LDLDIRECT 146.5 09/18/2012   TRIG 57.0 09/18/2012   CHOLHDL 4 09/18/2012  she is not on a statin. She developed leg cramps with pravastatin and atorvastatin.  - last eye exam was 10/24/2012. No DR.  - no numbness and tingling in her feet.  She lost 11 lbs since last visit, cutting down portions.  She also has a history of hypertension, hyperlipidemia, obesity-has been recommended Belviq, but too expensive. She has been considered for lap band surgery. She also has bilateral chronic lower extremity cramps, right knee pain, anemia.  ROS: Constitutional: + weight loss, no fatigue, no  subjective hyperthermia/hypothermia Eyes: no blurry vision, no xerophthalmia ENT: no sore throat, no nodules palpated in throat, no dysphagia/odynophagia, no hoarseness Cardiovascular: no CP/SOB/palpitations/leg swelling Respiratory: no cough/SOB Gastrointestinal: no N/V/D/C Musculoskeletal: no muscle/no joint aches  Skin: no rashes Neurological: no tremors/numbness/tingling/dizziness  I reviewed pt's medications, allergies, PMH, social hx, family hx and no changes required, except as mentioned above.  PE: BP 122/74  Pulse 78  Temp(Src) 97.5 F (36.4 C) (Oral)  Resp 12  Wt 228 lb (103.42 kg)  SpO2 95% Wt Readings from Last 3 Encounters:  07/02/13 228 lb (103.42 kg)  05/12/13 235 lb (106.595 kg)  04/02/13 239 lb (108.41 kg)   Constitutional: obese, in NAD Eyes: PERRLA, EOMI, no exophthalmos ENT: moist mucous membranes, slight thyromegaly vs anterior cervical fat deposit, no cervical lymphadenopathy Cardiovascular: RRR, No MRG, + dense bilateral lower systemic edema, L>R Respiratory: CTA B Gastrointestinal: abdomen soft, NT, ND, BS+ Musculoskeletal: no deformities, strength intact in all 4 Skin: moist, warm, no rashes  ASSESSMENT: 1. DM2, non-insulin-dependent, now with improved control, with short term complications (hypoglycemia)  2. Hyperlipidemia - Developed leg cramps with pravastatin and atorvastatin - has not tried Crestor or fluvastatin  PLAN:  1. DM 2 Patient with long-standing, recently more controlled diabetes, on oral antidiabetic regimen, previously with hypoglycemia, now greatly improved control (upon stopping sulfonylurea and starting a DPP4 inhibitor), with all CBGs at goal -  checking only around lunchtime (seldom in am) - I suggested to:  Continue metformin at 1000 mg twice a day Continue Januvia at the 100 mg daily - continue checking her sugars at different times of the day - check some sugars at bedtime, too - Will check hemoglobin A1c today - Return  to clinic in 3 months with sugar log   2. Hyperlipidemia - not on statins - I advised her to try to take the statins 1 week apart and, if no leg cramps, then move them 6 days apart of less - advised her to try to have fruit and vegetables as much as possible  Office Visit on 07/02/2013  Component Date Value Ref Range Status  . Hemoglobin A1C 07/02/2013 7.3* 4.6 - 6.5 % Final   Glycemic Control Guidelines for People with Diabetes:Non Diabetic:  <6%Goal of Therapy: <7%Additional Action Suggested:  >8%    HbA1c better!

## 2013-07-16 ENCOUNTER — Other Ambulatory Visit: Payer: Self-pay | Admitting: Family

## 2013-07-21 ENCOUNTER — Other Ambulatory Visit: Payer: Self-pay

## 2013-07-21 MED ORDER — CETIRIZINE HCL 10 MG PO TABS: ORAL_TABLET | ORAL | Status: DC

## 2013-08-05 ENCOUNTER — Other Ambulatory Visit: Payer: Self-pay

## 2013-08-05 MED ORDER — PENICILLIN V POTASSIUM 500 MG PO TABS: 500.0000 mg | ORAL_TABLET | Freq: Four times a day (QID) | ORAL | Status: DC

## 2013-08-25 ENCOUNTER — Other Ambulatory Visit: Payer: Self-pay

## 2013-08-25 MED ORDER — MENTHOL (TOPICAL ANALGESIC) 4 % EX GEL: CUTANEOUS | Status: DC

## 2013-10-05 ENCOUNTER — Ambulatory Visit (INDEPENDENT_AMBULATORY_CARE_PROVIDER_SITE_OTHER): Payer: 59 | Admitting: Internal Medicine

## 2013-10-05 ENCOUNTER — Encounter: Payer: Self-pay | Admitting: Internal Medicine

## 2013-10-05 VITALS — BP 162/80 | HR 75 | Temp 98.1°F | Resp 12 | Wt 229.0 lb

## 2013-10-05 DIAGNOSIS — IMO0001 Reserved for inherently not codable concepts without codable children: Secondary | ICD-10-CM

## 2013-10-05 DIAGNOSIS — E1165 Type 2 diabetes mellitus with hyperglycemia: Principal | ICD-10-CM

## 2013-10-05 LAB — HEMOGLOBIN A1C: HEMOGLOBIN A1C: 7.2 % — AB (ref 4.6–6.5)

## 2013-10-05 NOTE — Patient Instructions (Signed)
Please continue Metformin 1000 mg 2x a day. Continue Januvia 100 mg daily in am.  Please come back for a follow-up appointment in 3 months  Please stop at the lab.

## 2013-10-05 NOTE — Progress Notes (Signed)
Patient ID: Katelyn Peterson, female   DOB: 1946/02/02, 68 y.o.   MRN: [id number]  HPI: Katelyn Peterson is a 68 y.o.-year-old female, returning for f/u for DM2, dx 2000, non-insulin-dependent, uncontrolled, without complications. Last visit 3 mo ago.  Last hemoglobin A1c was: Lab Results  Component Value Date   HGBA1C 7.3* 07/02/2013   HGBA1C 7.5* 04/02/2013   HGBA1C 7.5* 12/23/2012   Pt is on a regimen of: - Metformin 1000 mg po bid - Januvia 100 mg qd We stopped Amaryl 2 mg bid and Actos 30 mg daily for daily hypoglycemia events and possible SEs, respectively.  Pt checks her sugars 1-2x a day and they are all at goal: - am: 90-110 >> 91-133 >> 120s >> n/c >> 100-120 >> 93, 105 - before lunch: 57-60s, despite eating a small snack before >> 85-133 >> n/c >> 93-140 >> n/c - 2h after lunch: ~110 >> only one check 142 >> 120-125 >> 81-141 >> 78-123 - before dinner: ~120 >> 78-127 >> n/c - 2h after dinner: 138 >> n/c - bedtime: not checking, but not feeling hypoglycemic >> 100-120 >> n/c Lowest sugar was 78;  she has hypoglycemia awareness at 80. Highest sugar was 130.  - no CKD, last BUN/creatinine:  Lab Results  Component Value Date   BUN 17 09/18/2012   CREATININE 0.9 09/18/2012  she is on quinapril 44.  - last set of lipids: Lab Results  Component Value Date   CHOL 201* 09/18/2012   HDL 51.20 09/18/2012   LDLCALC 146* 10/12/2008   LDLDIRECT 146.5 09/18/2012   TRIG 57.0 09/18/2012   CHOLHDL 4 09/18/2012  she is not on a statin (takes 1 tab when she remembers). She developed leg cramps with pravastatin and atorvastatin. She did not start it weekly as I suggested  - last eye exam was 10/24/2012. No DR.  - no numbness and tingling in her feet.  She lost another 6 lbs since last visit! (cutting down portions).  She also has a history of hypertension, hyperlipidemia, obesity-has been recommended Belviq, but too expensive. She has been considered for lap band surgery. She also has  bilateral chronic lower extremity cramps, right knee pain, anemia.  ROS: Constitutional: + weight loss, no fatigue, no subjective hyperthermia/hypothermia Eyes: no blurry vision, no xerophthalmia ENT: no sore throat, no nodules palpated in throat, no dysphagia/odynophagia, no hoarseness Cardiovascular: no CP/SOB/palpitations/leg swelling Respiratory: no cough/SOB Gastrointestinal: no N/V/D/C Musculoskeletal: no muscle/no joint aches  Skin: no rashes Neurological: no tremors/numbness/tingling/dizziness  I reviewed pt's medications, allergies, PMH, social hx, family hx and no changes required, except as mentioned above.  PE: BP 162/80  Pulse 75  Temp(Src) 98.1 F (36.7 C) (Oral)  Resp 12  Wt 229 lb (103.874 kg)  SpO2 97% Wt Readings from Last 3 Encounters:  10/05/13 229 lb (103.874 kg)  07/02/13 228 lb (103.42 kg)  05/12/13 235 lb (106.595 kg)   Constitutional: obese, in NAD Eyes: PERRLA, EOMI, no exophthalmos ENT: moist mucous membranes, slight thyromegaly vs anterior cervical fat deposit, no cervical lymphadenopathy Cardiovascular: RRR, No MRG Respiratory: CTA B Gastrointestinal: abdomen soft, NT, ND, BS+ Musculoskeletal: no deformities, strength intact in all 4 Skin: moist, warm, no rashes  ASSESSMENT: 1. DM2, non-insulin-dependent, now with improved control, with short term complications (hypoglycemia)  2. Hyperlipidemia - Developed leg cramps with pravastatin and atorvastatin - has not tried Crestor or fluvastatin  PLAN:  1. DM 2 Patient with long-standing, recently more controlled diabetes, on oral antidiabetic regimen, previously with hypoglycemia,  now greatly improved control (upon stopping sulfonylurea and starting a DPP4 inhibitor), with all CBGs at goal - checking mostly around lunchtime (seldom in am) - I suggested to:  Continue metformin at 1000 mg twice a day Continue Januvia at the 100 mg daily - continue checking her sugars at different times of the  day - Will check hemoglobin A1c today - Return to clinic in 3 months with sugar log   2. Hyperlipidemia - not on statins - I advised her to try to take the statins 1 week apart and, if no leg cramps, then move them 6 days apart of less - she agrees to try this  Office Visit on 10/05/2013  Component Date Value Ref Range Status  . Hemoglobin A1C 10/05/2013 7.2* 4.6 - 6.5 % Final   Glycemic Control Guidelines for People with Diabetes:Non Diabetic:  <6%Goal of Therapy: <7%Additional Action Suggested:  >8%    Excellent HbA1c.

## 2013-10-10 IMAGING — CR DG ANKLE COMPLETE 3+V*L*
3 series · 3 of 3 positions shown · non-contrast
Comparison: None.

CLINICAL DATA: Pain and swelling.

LEFT ANKLE COMPLETE - 3+ VIEW

[view not recorded (1 of 3)]
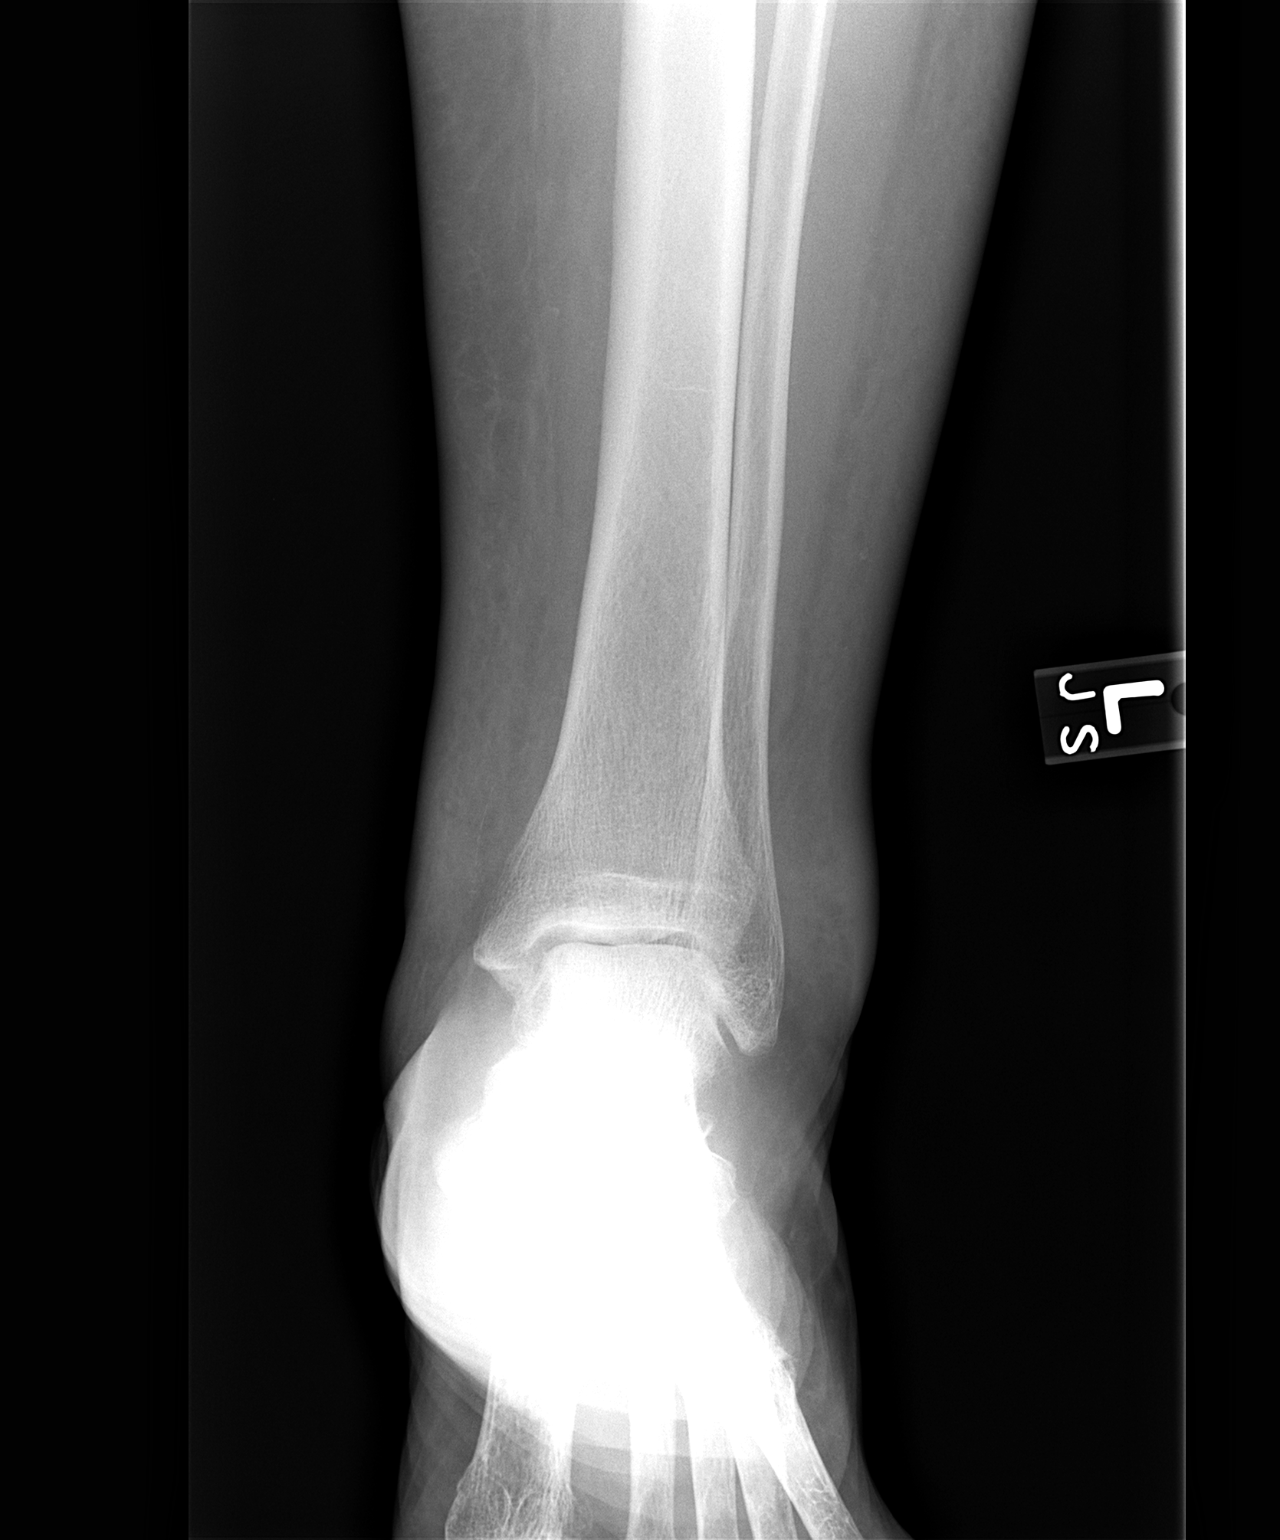

[view not recorded (2 of 3)]
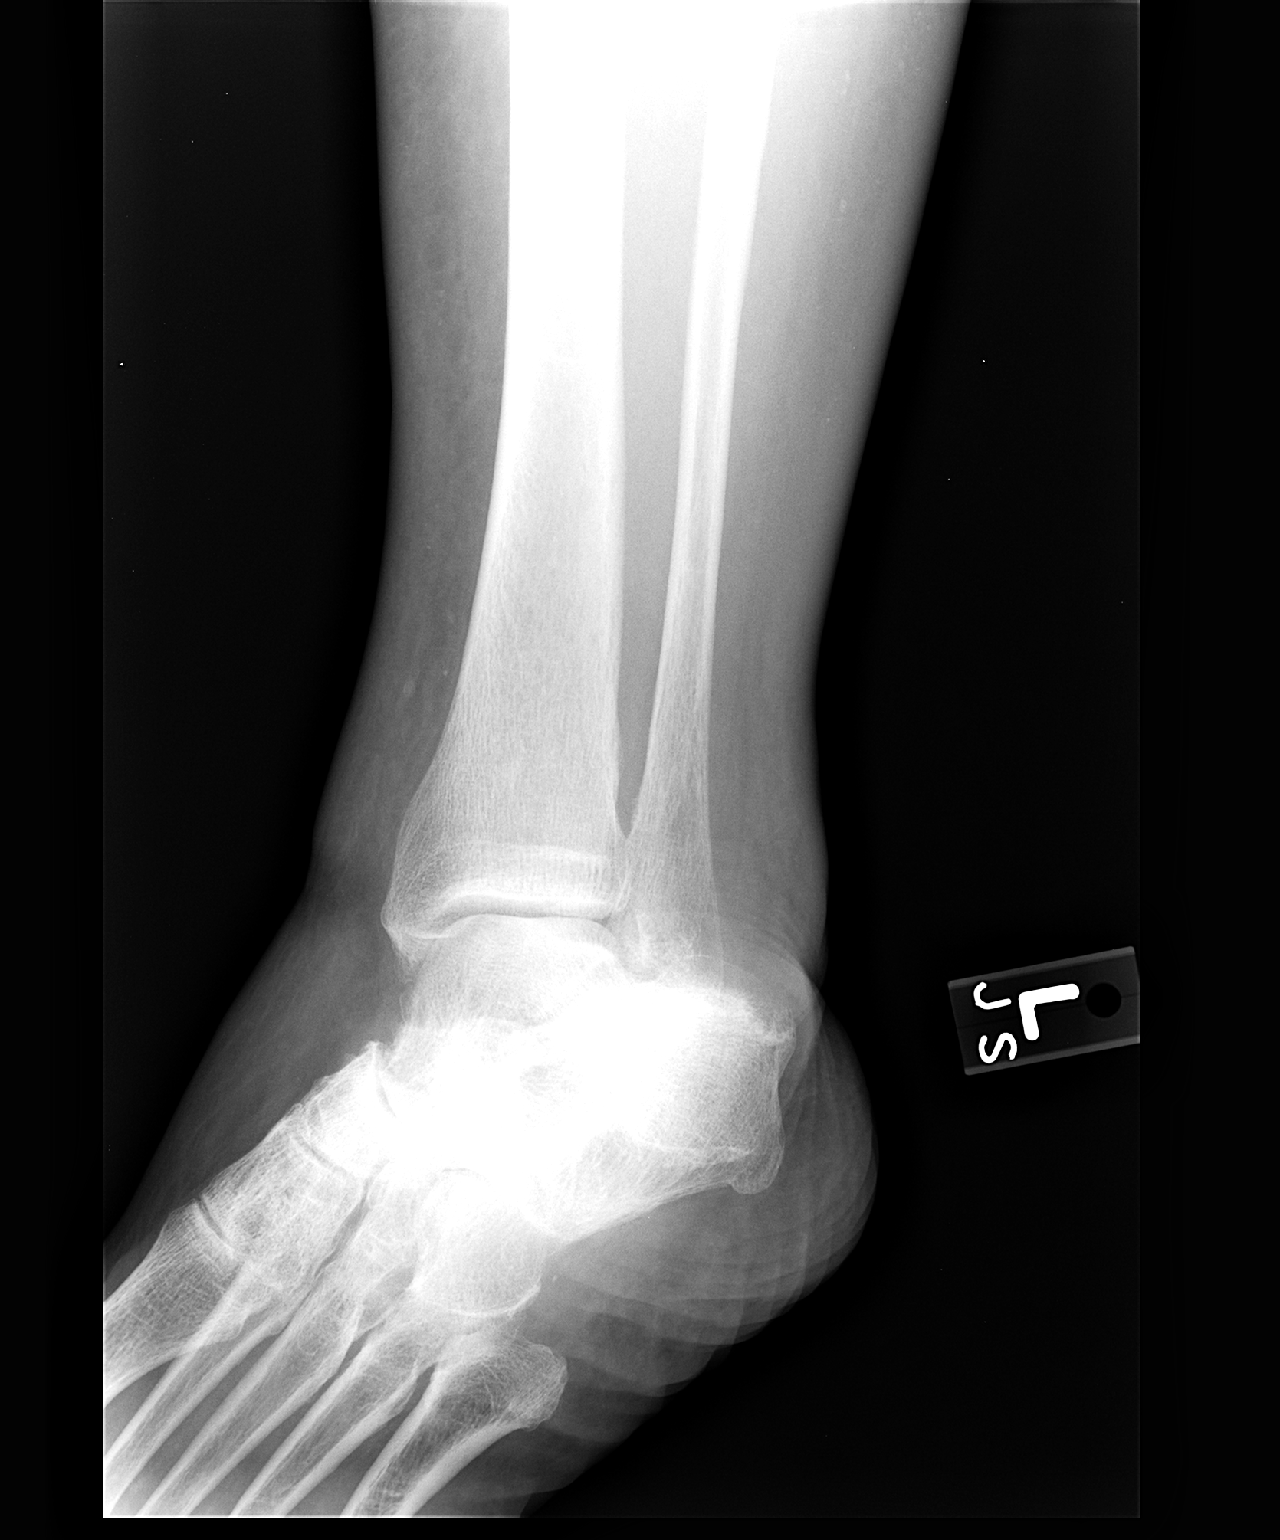

[view not recorded (3 of 3)]
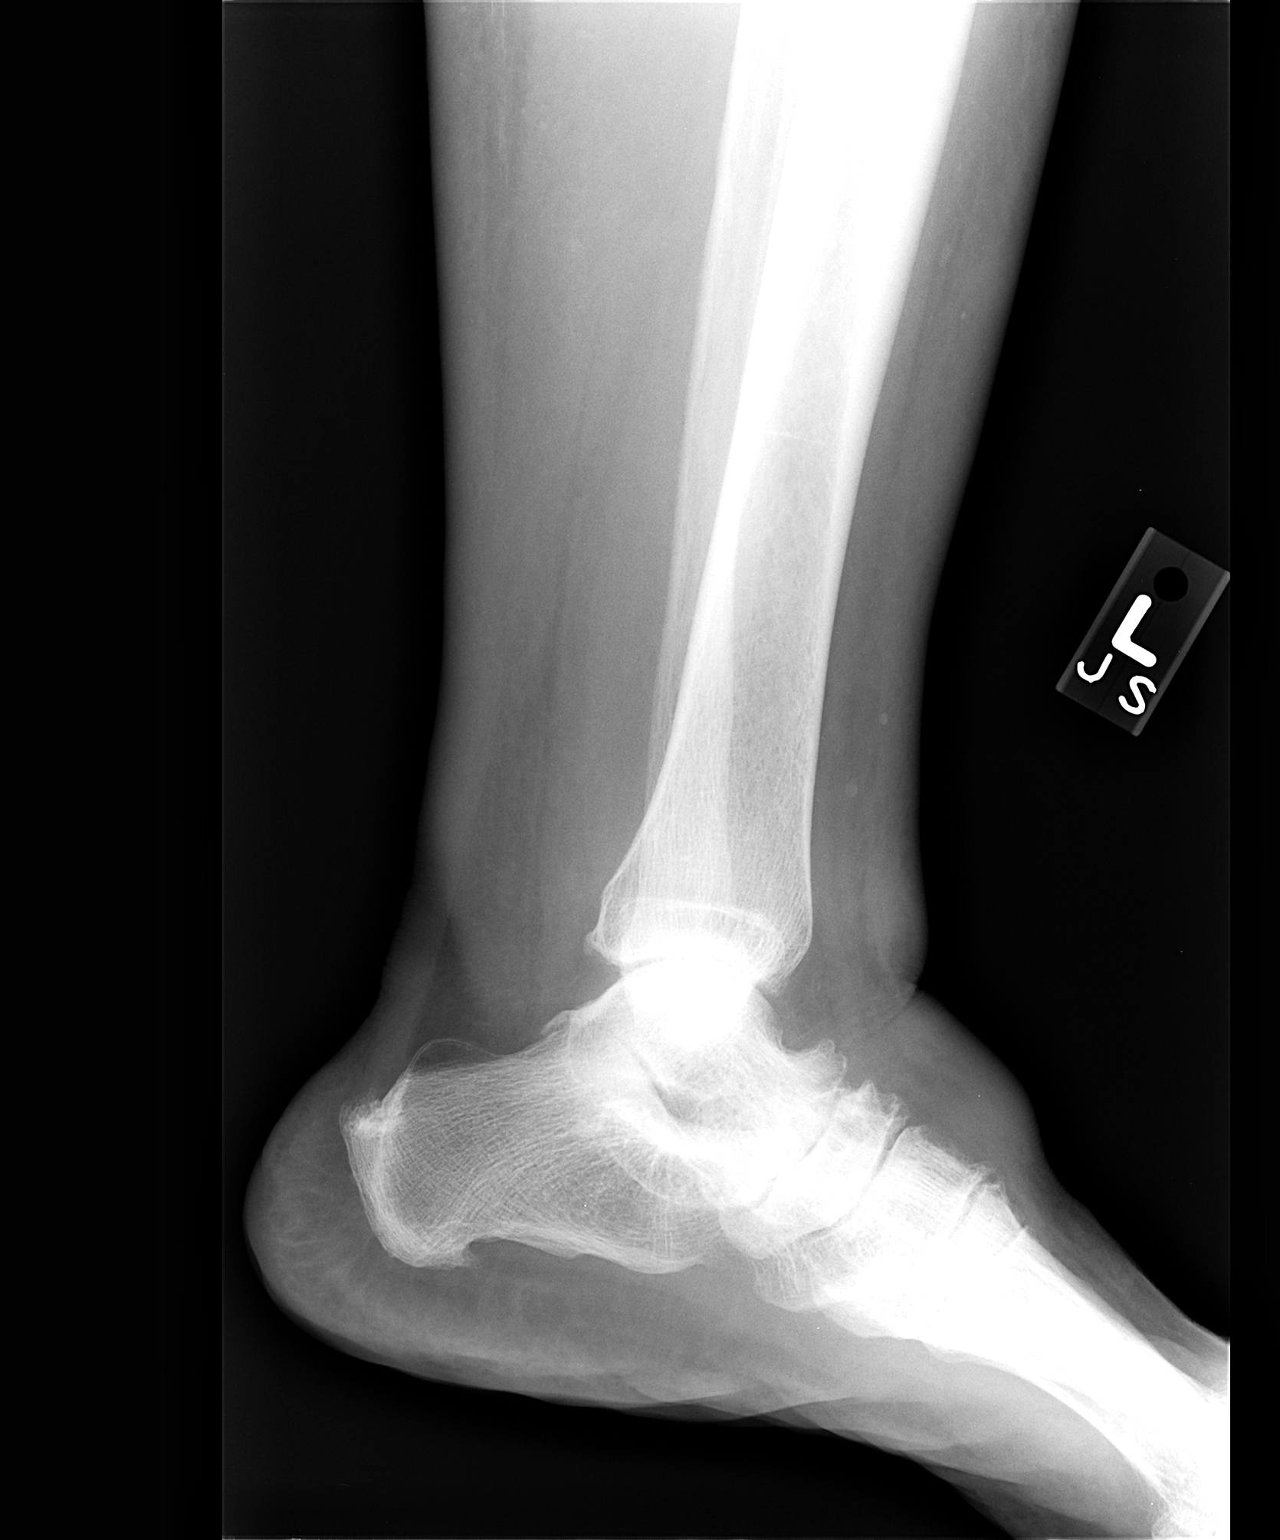

[3 of 3 positions shown; findings below may reference images not displayed]

FINDINGS: There is diffuse soft tissue swelling about the ankle.
No underlying fracture or dislocation.  Midfoot degenerative
disease noted.
IMPRESSION: 1.  Soft tissue swelling without underlying acute bony or joint
abnormality.
2.  Marked midfoot osteoarthritis.

## 2013-11-12 ENCOUNTER — Encounter: Payer: Self-pay | Admitting: Internal Medicine

## 2013-11-12 ENCOUNTER — Ambulatory Visit (INDEPENDENT_AMBULATORY_CARE_PROVIDER_SITE_OTHER): Payer: 59 | Admitting: Internal Medicine

## 2013-11-12 VITALS — BP 148/80 | HR 80 | Temp 98.3°F | Resp 16 | Wt 226.0 lb

## 2013-11-12 DIAGNOSIS — E669 Obesity, unspecified: Secondary | ICD-10-CM

## 2013-11-12 DIAGNOSIS — I10 Essential (primary) hypertension: Secondary | ICD-10-CM

## 2013-11-12 DIAGNOSIS — E1165 Type 2 diabetes mellitus with hyperglycemia: Principal | ICD-10-CM

## 2013-11-12 DIAGNOSIS — IMO0001 Reserved for inherently not codable concepts without codable children: Secondary | ICD-10-CM

## 2013-11-12 MED ORDER — METFORMIN HCL 1000 MG PO TABS: 1000.0000 mg | ORAL_TABLET | Freq: Two times a day (BID) | ORAL | Status: DC

## 2013-11-12 MED ORDER — LORCASERIN HCL 10 MG PO TABS: 1.0000 | ORAL_TABLET | Freq: Two times a day (BID) | ORAL | Status: DC

## 2013-11-12 MED ORDER — VERAPAMIL HCL ER 240 MG PO CP24: 240.0000 mg | ORAL_CAPSULE | Freq: Every day | ORAL | Status: DC

## 2013-11-12 NOTE — Progress Notes (Signed)
   Subjective:    HPI   The patient presents for a follow-up of  chronic hypertension, chronic dyslipidemia, type 2 diabetes controlled with medicines.   F/u LE cramps B - chronic  BP 120/70 at work   [employer] from Last 3 Encounters:  11/12/13 226 lb (102.513 kg)  10/05/13 229 lb (103.874 kg)  07/02/13 228 lb (103.42 kg)   BP Readings from Last 3 Encounters:  11/12/13 148/80  10/05/13 162/80  07/02/13 122/74       Review of Systems  Constitutional: Negative for activity change, appetite change and unexpected weight change.  HENT: Negative for mouth sores and sinus pressure.   Eyes: Negative for visual disturbance.  Respiratory: Negative for chest tightness.   Genitourinary: Negative for frequency, difficulty urinating and vaginal pain.  Musculoskeletal: Negative for back pain and gait problem.  Skin: Negative for pallor.  Neurological: Negative for dizziness and tremors.  Psychiatric/Behavioral: Positive for sleep disturbance. Negative for suicidal ideas and confusion. The patient is nervous/anxious.        Objective:   Physical Exam  Constitutional: She appears well-developed. No distress.  HENT:  Head: Normocephalic.  Right Ear: External ear normal.  Left Ear: External ear normal.  Nose: Nose normal.  Mouth/Throat: Oropharynx is clear and moist.  Eyes: Conjunctivae are normal. Pupils are equal, round, and reactive to light. Right eye exhibits no discharge. Left eye exhibits no discharge.  Neck: Normal range of motion. Neck supple. No JVD present. No tracheal deviation present. No thyromegaly present.  Cardiovascular: Normal rate, regular rhythm and normal heart sounds.   Pulmonary/Chest: No stridor. No respiratory distress. She has no wheezes.  Abdominal: Soft. Bowel sounds are normal. She exhibits no distension and no mass. There is no tenderness. There is no rebound and no guarding.  Musculoskeletal: She exhibits no edema and no tenderness.   Lymphadenopathy:    She has no cervical adenopathy.  Neurological: She displays normal reflexes. No cranial nerve deficit. She exhibits normal muscle tone. Coordination normal.  Skin: No rash noted. No erythema.  Psychiatric: She has a normal mood and affect. Her behavior is normal. Judgment and thought content normal.  Not sad    Lab Results  Component Value Date   WBC 6.2 03/01/2010   HGB 11.1* 03/01/2010   HCT 33.1* 03/01/2010   PLT 244.0 03/01/2010   GLUCOSE 136* 09/18/2012   CHOL 201* 09/18/2012   TRIG 57.0 09/18/2012   HDL 51.20 09/18/2012   LDLDIRECT 146.5 09/18/2012   LDLCALC 146* 10/12/2008   ALT 28 12/25/2011   AST 26 12/25/2011   NA 142 09/18/2012   K 4.6 09/18/2012   CL 110 09/18/2012   CREATININE 0.9 09/18/2012   BUN 17 09/18/2012   CO2 25 09/18/2012   TSH 2.31 03/01/2010   HGBA1C 7.2* 10/05/2013         Assessment & Plan:

## 2013-11-12 NOTE — Assessment & Plan Note (Signed)
Belviq trial  Zola's diet - plan Z

## 2013-11-12 NOTE — Progress Notes (Signed)
Pre visit review using our clinic review tool, if applicable. No additional management support is needed unless otherwise documented below in the visit note. 

## 2013-11-12 NOTE — Assessment & Plan Note (Addendum)
Continue with current prescription therapy as reflected on the Med list. BP 120/70 at work

## 2013-11-12 NOTE — Assessment & Plan Note (Signed)
Continue with current prescription therapy as reflected on the Med list. Cont w/wt loss

## 2013-11-12 NOTE — Patient Instructions (Signed)
Belviq trial  Zola's diet - plan Z

## 2013-11-13 LAB — HM DIABETES EYE EXAM

## 2013-11-18 ENCOUNTER — Encounter: Payer: Self-pay | Admitting: Internal Medicine

## 2014-01-05 ENCOUNTER — Encounter: Payer: Self-pay | Admitting: Internal Medicine

## 2014-01-05 ENCOUNTER — Encounter: Payer: Self-pay | Admitting: *Deleted

## 2014-01-05 ENCOUNTER — Ambulatory Visit (INDEPENDENT_AMBULATORY_CARE_PROVIDER_SITE_OTHER): Payer: 59 | Admitting: Internal Medicine

## 2014-01-05 ENCOUNTER — Other Ambulatory Visit: Payer: Self-pay | Admitting: *Deleted

## 2014-01-05 VITALS — BP 112/64 | HR 81 | Temp 97.9°F | Resp 12 | Wt 219.0 lb

## 2014-01-05 DIAGNOSIS — E1165 Type 2 diabetes mellitus with hyperglycemia: Secondary | ICD-10-CM

## 2014-01-05 DIAGNOSIS — E049 Nontoxic goiter, unspecified: Secondary | ICD-10-CM

## 2014-01-05 DIAGNOSIS — E785 Hyperlipidemia, unspecified: Secondary | ICD-10-CM

## 2014-01-05 DIAGNOSIS — IMO0001 Reserved for inherently not codable concepts without codable children: Secondary | ICD-10-CM

## 2014-01-05 LAB — COMPREHENSIVE METABOLIC PANEL
ALK PHOS: 71 U/L (ref 39–117)
ALT: 15 U/L (ref 0–35)
AST: 10 U/L (ref 0–37)
Albumin: 3.2 g/dL — ABNORMAL LOW (ref 3.5–5.2)
BILIRUBIN TOTAL: 0.4 mg/dL (ref 0.2–1.2)
BUN: 24 mg/dL — ABNORMAL HIGH (ref 6–23)
CO2: 24 meq/L (ref 19–32)
Calcium: 9.1 mg/dL (ref 8.4–10.5)
Chloride: 108 mEq/L (ref 96–112)
Creatinine, Ser: 1.1 mg/dL (ref 0.4–1.2)
GFR: 63.56 mL/min (ref >60.00)
Glucose, Bld: 129 mg/dL — ABNORMAL HIGH (ref 70–99)
Potassium: 4.8 mEq/L (ref 3.5–5.1)
Sodium: 137 mEq/L (ref 135–145)
Total Protein: 7.7 g/dL (ref 6.0–8.3)

## 2014-01-05 LAB — LIPID PANEL
Cholesterol: 198 mg/dL (ref 0–200)
HDL: 41.9 mg/dL (ref >39.00)
LDL CALC: 145 mg/dL — AB (ref 0–99)
NONHDL: 156.1
Total CHOL/HDL Ratio: 5
Triglycerides: 56 mg/dL (ref 0.0–149.0)
VLDL: 11.2 mg/dL (ref 0.0–40.0)

## 2014-01-05 LAB — HEMOGLOBIN A1C: HEMOGLOBIN A1C: 7.1 % — AB (ref 4.6–6.5)

## 2014-01-05 MED ORDER — ROSUVASTATIN CALCIUM 5 MG PO TABS: 5.0000 mg | ORAL_TABLET | Freq: Every day | ORAL | Status: DC

## 2014-01-05 NOTE — Patient Instructions (Signed)
Continue metformin at 1000 mg twice a day Continue Januvia at the 100 mg daily  Please stop at the lab.  Please come back for a follow-up appointment in 3 months

## 2014-01-05 NOTE — Progress Notes (Signed)
Patient ID: Katelyn Peterson, female   DOB: December 25, 1945, 68 y.o.   MRN: [id number]  HPI: Katelyn Peterson is a 68 y.o.-year-old female, returning for f/u for DM2, dx 2000, non-insulin-dependent, uncontrolled, with complications (DR). Last visit 3 mo ago.  Last hemoglobin A1c was: Lab Results  Component Value Date   HGBA1C 7.2* 10/05/2013   HGBA1C 7.3* 07/02/2013   HGBA1C 7.5* 04/02/2013   Pt is on a regimen of: - Metformin 1000 mg po bid - Januvia 100 mg qd We stopped Amaryl 2 mg bid and Actos 30 mg daily for daily hypoglycemia events and possible SEs, respectively.  Pt checks her sugars 1-2x a day and they are all at goal: - am: 90-110 >> 91-133 >> 120s >> n/c >> 100-120 >> 93, 105 >> n/c - 2h after b'fast: 89-130 - before lunch: 57-60s, despite eating a small snack before >> 85-133 >> n/c >> 93-140 >> n/c >> 114-120 - 2h after lunch: ~110 >> only one check 142 >> 120-125 >> 81-141 >> 78-123 >> 83-122, 136, 151 - before dinner: ~120 >> 78-127 >> n/c - 2h after dinner: 138 >> n/c - bedtime: not checking, but not feeling hypoglycemic >> 100-120 >> n/c Lowest sugar was 83;  she has hypoglycemia awareness at 80. Highest sugar was 151x1.  - no CKD, last BUN/creatinine:  Lab Results  Component Value Date   BUN 17 09/18/2012   CREATININE 0.9 09/18/2012  She is on quinapril 44.  - last set of lipids: Lab Results  Component Value Date   CHOL 201* 09/18/2012   HDL 51.20 09/18/2012   LDLCALC 146* 10/12/2008   LDLDIRECT 146.5 09/18/2012   TRIG 57.0 09/18/2012   CHOLHDL 4 09/18/2012  She is not on a statin anymore. She developed leg cramps with pravastatin and atorvastatin. She did not start it weekly as I suggested.  - last eye exam was 10/2013. ? DR.  - no numbness and tingling in her feet.  She also has a history of hypertension, hyperlipidemia (I recommended to retry statins, but did not do this), obesity-has been recommended Belviq, but too expensive. She has been considered for lap band  surgery. She also has bilateral chronic lower extremity cramps, right knee pain, anemia.  ROS: Constitutional: + weight loss, no fatigue, no subjective hyperthermia/hypothermia Eyes: no blurry vision, no xerophthalmia ENT: no sore throat, no nodules palpated in throat, no dysphagia/odynophagia, no hoarseness Cardiovascular: no CP/SOB/palpitations/leg swelling Respiratory: no cough/SOB Gastrointestinal: no N/V/D/C Musculoskeletal: no muscle/no joint aches  Skin: no rashes Neurological: no tremors/numbness/tingling/dizziness  I reviewed pt's medications, allergies, PMH, social hx, family hx and no changes required, except as mentioned above.  PE: BP 112/64  Pulse 81  Temp(Src) 97.9 F (36.6 C) (Oral)  Resp 12  Wt 219 lb (99.338 kg)  SpO2 95% Body mass index is 37.02 kg/(m^2).  Wt Readings from Last 3 Encounters:  01/05/14 219 lb (99.338 kg)  11/12/13 226 lb (102.513 kg)  10/05/13 229 lb (103.874 kg)   Constitutional: obese, in NAD Eyes: PERRLA, EOMI, no exophthalmos ENT: moist mucous membranes, slight thyromegaly vs anterior cervical fat deposit, no cervical lymphadenopathy Cardiovascular: RRR, No MRG Respiratory: CTA B Gastrointestinal: abdomen soft, NT, ND, BS+ Musculoskeletal: no deformities, strength intact in all 4 Skin: moist, warm, no rashes  ASSESSMENT: 1. DM2, non-insulin-dependent, now with improved control, with short term complications (hypoglycemia) - DR  2. Hyperlipidemia - Developed leg cramps with pravastatin and atorvastatin - has not tried Crestor or fluvastatin  3. Goiter 11/04/2012:  Thyroid U/S:  Right thyroid lobe: 4.3 x 1.0 x 1.8 cm   Left thyroid lobe: 4.6 x 1.2 x 1.9 cm   Isthmus: 5 mm   Focal nodules: Thyroid echotexture is homogeneous, without solid or cystic lesion.   Lymphadenopathy: None visualized.  IMPRESSION:  Normal thyroid ultrasound. No evidence of thyromegaly or dominant solid/cystic mass.  PLAN:  1. DM 2 Patient with  long-standing, recently more controlled diabetes, on oral antidiabetic regimen, previously with hypoglycemia, now greatly improved control (upon stopping sulfonylurea and starting a DPP4 inhibitor), with all CBGs at goal. - I suggested to:  Continue metformin at 1000 mg twice a day Continue Januvia at the 100 mg daily - continue checking her sugars at different times of the day, include some bedtimes - Will check hemoglobin A1c today. Will add a CMP. - had the flu vaccine - Return to clinic in 3 months with sugar log   2. Hyperlipidemia - not on statin - I advised her to try to take the statins 1 week apart and, if no leg cramps, then move them 6 days apart of less - she agrees to try this - will repeat Lipid panel  3. Goiter - not substantiated on Thyroid U/S in 2014  Office Visit on 01/05/2014  Component Date Value Ref Range Status  . Hemoglobin A1C 01/05/2014 7.1* 4.6 - 6.5 % Final   Glycemic Control Guidelines for People with Diabetes:Non Diabetic:  <6%Goal of Therapy: <7%Additional Action Suggested:  >8%   . Sodium 01/05/2014 137  135 - 145 mEq/L Final  . Potassium 01/05/2014 4.8  3.5 - 5.1 mEq/L Final  . Chloride 01/05/2014 108  96 - 112 mEq/L Final  . CO2 01/05/2014 24  19 - 32 mEq/L Final  . Glucose, Bld 01/05/2014 129* 70 - 99 mg/dL Final  . BUN 01/05/2014 24* 6 - 23 mg/dL Final  . Creatinine, Ser 01/05/2014 1.1  0.4 - 1.2 mg/dL Final  . Total Bilirubin 01/05/2014 0.4  0.2 - 1.2 mg/dL Final  . Alkaline Phosphatase 01/05/2014 71  39 - 117 U/L Final  . AST 01/05/2014 10  0 - 37 U/L Final  . ALT 01/05/2014 15  0 - 35 U/L Final  . Total Protein 01/05/2014 7.7  6.0 - 8.3 g/dL Final  . Albumin 01/05/2014 3.2* 3.5 - 5.2 g/dL Final  . Calcium 01/05/2014 9.1  8.4 - 10.5 mg/dL Final  . GFR 01/05/2014 63.56  >60.00 mL/min Final  . Cholesterol 01/05/2014 198  0 - 200 mg/dL Final   ATP III Classification       Desirable:  < 200 mg/dL               Borderline High:  200 - 239 mg/dL           High:  > = 240 mg/dL  . Triglycerides 01/05/2014 56.0  0.0 - 149.0 mg/dL Final   Normal:  <150 mg/dLBorderline High:  150 - 199 mg/dL  . HDL 01/05/2014 41.90  >39.00 mg/dL Final  . VLDL 01/05/2014 11.2  0.0 - 40.0 mg/dL Final  . LDL Cholesterol 01/05/2014 145* 0 - 99 mg/dL Final  . Total CHOL/HDL Ratio 01/05/2014 5   Final                  Men          Women1/2 Average Risk     3.4          3.3Average Risk  5.0          4.42X Average Risk          9.6          7.13X Average Risk          15.0          11.0                      . NonHDL 01/05/2014 156.10   Final   NOTE:  Non-HDL goal should be 30 mg/dL higher than patient's LDL goal (i.e. LDL goal of < 70 mg/dL, would have non-HDL goal of < 100 mg/dL)   The HbA1c is a little better! LDL high >> will again suggest a statin >> e.g. Crestor 5 mg taken q7 days and move it closer together if she tolerates it well. We discussed at the time of the visit that if her cholesterol is high, to start this, so I will go ahead and send it to her pharmacy. Her creatinine is a little be higher, but BUN is also high, so I will advise her to stay hydrated and repeat her kidney function at the next visit.

## 2014-03-01 ENCOUNTER — Other Ambulatory Visit: Payer: Self-pay | Admitting: Internal Medicine

## 2014-03-01 MED ORDER — VERAPAMIL HCL ER 240 MG PO CP24
240.0000 mg | ORAL_CAPSULE | Freq: Every day | ORAL | Status: DC
Start: 2014-03-01 — End: 2015-02-18

## 2014-03-01 NOTE — Telephone Encounter (Signed)
Left msg on triage pharmacy will be sending over refill request on verapamil, spironlactone, and lisinopril needing med today...Johny Chess

## 2014-04-05 ENCOUNTER — Ambulatory Visit (INDEPENDENT_AMBULATORY_CARE_PROVIDER_SITE_OTHER): Payer: 59 | Admitting: Internal Medicine

## 2014-04-05 ENCOUNTER — Encounter: Payer: Self-pay | Admitting: Internal Medicine

## 2014-04-05 VITALS — BP 132/88 | HR 70 | Temp 98.1°F | Resp 12 | Wt 215.0 lb

## 2014-04-05 DIAGNOSIS — IMO0002 Reserved for concepts with insufficient information to code with codable children: Secondary | ICD-10-CM

## 2014-04-05 DIAGNOSIS — E11319 Type 2 diabetes mellitus with unspecified diabetic retinopathy without macular edema: Secondary | ICD-10-CM

## 2014-04-05 DIAGNOSIS — E1165 Type 2 diabetes mellitus with hyperglycemia: Secondary | ICD-10-CM

## 2014-04-05 LAB — LIPID PANEL
CHOL/HDL RATIO: 3
Cholesterol: 176 mg/dL (ref 0–200)
HDL: 53.1 mg/dL (ref >39.00)
LDL CALC: 110 mg/dL — AB (ref 0–99)
NonHDL: 122.9
TRIGLYCERIDES: 64 mg/dL (ref 0.0–149.0)
VLDL: 12.8 mg/dL (ref 0.0–40.0)

## 2014-04-05 LAB — COMPREHENSIVE METABOLIC PANEL
ALBUMIN: 4 g/dL (ref 3.5–5.2)
ALT: 12 U/L (ref 0–35)
AST: 11 U/L (ref 0–37)
Alkaline Phosphatase: 81 U/L (ref 39–117)
BILIRUBIN TOTAL: 0.3 mg/dL (ref 0.2–1.2)
BUN: 15 mg/dL (ref 6–23)
CALCIUM: 9.2 mg/dL (ref 8.4–10.5)
CHLORIDE: 106 meq/L (ref 96–112)
CO2: 26 mEq/L (ref 19–32)
CREATININE: 0.95 mg/dL (ref 0.40–1.20)
GFR: 75.22 mL/min (ref >60.00)
GLUCOSE: 163 mg/dL — AB (ref 70–99)
Potassium: 4.8 mEq/L (ref 3.5–5.1)
Sodium: 138 mEq/L (ref 135–145)
Total Protein: 7.6 g/dL (ref 6.0–8.3)

## 2014-04-05 LAB — HEMOGLOBIN A1C: Hgb A1c MFr Bld: 7.2 % — ABNORMAL HIGH (ref 4.6–6.5)

## 2014-04-05 NOTE — Progress Notes (Signed)
Patient ID: Katelyn Peterson, female   DOB: [birth year], 69 y.o.   MRN: [id number]  HPI: Katelyn Peterson is a 69 y.o.-year-old female, returning for f/u for DM2, dx 2000, non-insulin-dependent, uncontrolled, with complications (DR). Last visit 3 mo ago.  Last hemoglobin A1c was: Lab Results  Component Value Date   HGBA1C 7.1* 01/05/2014   HGBA1C 7.2* 10/05/2013   HGBA1C 7.3* 07/02/2013   Pt is on a regimen of: - Metformin 1000 mg po bid - Januvia 100 mg qd We stopped Amaryl 2 mg bid and Actos 30 mg daily for daily hypoglycemia events and possible SEs, respectively.  Pt checks her sugars 1-2x a day and they are all at goal: - am: 90-110 >> 91-133 >> 120s >> n/c >> 100-120 >> 93, 105 >> n/c - 2h after b'fast: 89-130 >> 96 - before lunch: 57-60s, despite eating a small snack before >> 85-133 >> n/c >> 93-140 >> n/c >> 114-120 >> 68-116 - 2h after lunch: ~110 >> only one check 142 >> 120-125 >> 81-141 >> 78-123 >> 83-122, 136, 151 >> 97-121, 138 - before dinner: ~120 >> 78-127 >> n/c >> 84-110 - 2h after dinner: 138 >> n/c - bedtime: not checking, but not feeling hypoglycemic >> 100-120 >> n/c Lowest sugar was 68;  she has hypoglycemia awareness at 80. Highest sugar was 138.  - no CKD, last BUN/creatinine:  Lab Results  Component Value Date   BUN 24* 01/05/2014   CREATININE 1.1 01/05/2014  She is on quinapril 44.  - last set of lipids: Lab Results  Component Value Date   CHOL 198 01/05/2014   HDL 41.90 01/05/2014   LDLCALC 145* 01/05/2014   LDLDIRECT 146.5 09/18/2012   TRIG 56.0 01/05/2014   CHOLHDL 5 01/05/2014  She is not on a statin anymore. She developed leg cramps with pravastatin and atorvastatin. She started Crestor 5 weekly.  - last eye exam was 10/2013. ? DR.  - no numbness and tingling in her feet.  She also has a history of hypertension, hyperlipidemia (I recommended to retry statins, but did not do this), obesity-has been recommended Belviq, but too expensive.  She has been considered for lap band surgery. She also has bilateral chronic lower extremity cramps, right knee pain, anemia.  ROS: Constitutional: + weight loss, no fatigue, no subjective hyperthermia/hypothermia Eyes: no blurry vision, no xerophthalmia ENT: no sore throat, no nodules palpated in throat, no dysphagia/odynophagia, no hoarseness Cardiovascular: no CP/SOB/palpitations/leg swelling Respiratory: no cough/SOB Gastrointestinal: no N/V/D/C Musculoskeletal: no muscle/no joint aches  Skin: no rashes Neurological: no tremors/numbness/tingling/dizziness  I reviewed pt's medications, allergies, PMH, social hx, family hx and no changes required, except as mentioned above.  PE: BP 132/88 mmHg  Pulse 70  Temp(Src) 98.1 F (36.7 C) (Oral)  Resp 12  Wt 215 lb (97.523 kg)  SpO2 95% Body mass index is 36.35 kg/(m^2).  Wt Readings from Last 3 Encounters:  04/05/14 215 lb (97.523 kg)  01/05/14 219 lb (99.338 kg)  11/12/13 226 lb (102.513 kg)   Constitutional: obese, in NAD Eyes: PERRLA, EOMI, no exophthalmos ENT: moist mucous membranes, no thyromegaly, no cervical lymphadenopathy Cardiovascular: RRR, No MRG Respiratory: CTA B Gastrointestinal: abdomen soft, NT, ND, BS+ Musculoskeletal: no deformities, strength intact in all 4 Skin: moist, warm, no rashes  ASSESSMENT: 1. DM2, non-insulin-dependent, now with improved control, w/o complications - DR  2. Hyperlipidemia - Developed leg cramps with pravastatin and atorvastatin - started Crestor   3. Goiter 11/04/2012: Thyroid U/S:  Right thyroid lobe: 4.3 x 1.0 x 1.8 cm   Left thyroid lobe: 4.6 x 1.2 x 1.9 cm   Isthmus: 5 mm   Focal nodules: Thyroid echotexture is homogeneous, without solid or cystic lesion.   Lymphadenopathy: None visualized.  IMPRESSION:  Normal thyroid ultrasound. No evidence of thyromegaly or dominant solid/cystic mass.  PLAN:  1. DM2 Patient with long-standing, recently more controlled  diabetes, on oral antidiabetic regimen, previously with hypoglycemia, now greatly improved control, with all CBGs at goal. - I suggested to:  Continue metformin at 1000 mg twice a day Continue Januvia at the 100 mg daily - continue checking her sugars at different times of the day, again advised to include some bedtimes - Will check hemoglobin A1c today. Will add a CMP since last Cr was a little high. - had the flu vaccine this season - Return to clinic in 3 months with sugar log   2. Hyperlipidemia - on statin: Crestor 5 mg weekly >> leg cramps but only occasionally - will repeat Lipid panel and LFT  Office Visit on 04/05/2014  Component Date Value Ref Range Status  . Sodium 04/05/2014 138  135 - 145 mEq/L Final  . Potassium 04/05/2014 4.8  3.5 - 5.1 mEq/L Final  . Chloride 04/05/2014 106  96 - 112 mEq/L Final  . CO2 04/05/2014 26  19 - 32 mEq/L Final  . Glucose, Bld 04/05/2014 163* 70 - 99 mg/dL Final  . BUN 04/05/2014 15  6 - 23 mg/dL Final  . Creatinine, Ser 04/05/2014 0.95  0.40 - 1.20 mg/dL Final  . Total Bilirubin 04/05/2014 0.3  0.2 - 1.2 mg/dL Final  . Alkaline Phosphatase 04/05/2014 81  39 - 117 U/L Final  . AST 04/05/2014 11  0 - 37 U/L Final  . ALT 04/05/2014 12  0 - 35 U/L Final  . Total Protein 04/05/2014 7.6  6.0 - 8.3 g/dL Final  . Albumin 04/05/2014 4.0  3.5 - 5.2 g/dL Final  . Calcium 04/05/2014 9.2  8.4 - 10.5 mg/dL Final  . GFR 04/05/2014 75.22  >60.00 mL/min Final  . Cholesterol 04/05/2014 176  0 - 200 mg/dL Final   ATP III Classification       Desirable:  < 200 mg/dL               Borderline High:  200 - 239 mg/dL          High:  > = 240 mg/dL  . Triglycerides 04/05/2014 64.0  0.0 - 149.0 mg/dL Final   Normal:  <150 mg/dLBorderline High:  150 - 199 mg/dL  . HDL 04/05/2014 53.10  >39.00 mg/dL Final  . VLDL 04/05/2014 12.8  0.0 - 40.0 mg/dL Final  . LDL Cholesterol 04/05/2014 110* 0 - 99 mg/dL Final  . Total CHOL/HDL Ratio 04/05/2014 3   Final                   Men          Women1/2 Average Risk     3.4          3.3Average Risk          5.0          4.42X Average Risk          9.6          7.13X Average Risk          15.0          11.0                      .  NonHDL 04/05/2014 122.90   Final   NOTE:  Non-HDL goal should be 30 mg/dL higher than patient's LDL goal (i.e. LDL goal of < 70 mg/dL, would have non-HDL goal of < 100 mg/dL)  . Hgb A1c MFr Bld 04/05/2014 7.2* 4.6 - 6.5 % Final   Glycemic Control Guidelines for People with Diabetes:Non Diabetic:  <6%Goal of Therapy: <7%Additional Action Suggested:  >8%    Hemoglobin A1c about the same as before, which is very close to goal.  LDL improved on Crestor, although she is taking it only once a week.  Her LFTs are normal  Her creatinine is better.

## 2014-04-05 NOTE — Patient Instructions (Signed)
Please stop at the lab.  Continue metformin at 1000 mg twice a day Continue Januvia at the 100 mg daily  Please return in 3 months with your sugar log.

## 2014-04-08 ENCOUNTER — Encounter: Payer: Self-pay | Admitting: *Deleted

## 2014-04-15 ENCOUNTER — Ambulatory Visit (INDEPENDENT_AMBULATORY_CARE_PROVIDER_SITE_OTHER): Payer: 59 | Admitting: Internal Medicine

## 2014-04-15 ENCOUNTER — Telehealth: Payer: Self-pay | Admitting: Internal Medicine

## 2014-04-15 ENCOUNTER — Encounter: Payer: Self-pay | Admitting: Internal Medicine

## 2014-04-15 VITALS — BP 142/80 | HR 87 | Temp 98.2°F | Wt 219.0 lb

## 2014-04-15 DIAGNOSIS — Z0000 Encounter for general adult medical examination without abnormal findings: Secondary | ICD-10-CM

## 2014-04-15 DIAGNOSIS — E049 Nontoxic goiter, unspecified: Secondary | ICD-10-CM

## 2014-04-15 DIAGNOSIS — E669 Obesity, unspecified: Secondary | ICD-10-CM

## 2014-04-15 DIAGNOSIS — I10 Essential (primary) hypertension: Secondary | ICD-10-CM

## 2014-04-15 DIAGNOSIS — M25561 Pain in right knee: Secondary | ICD-10-CM

## 2014-04-15 DIAGNOSIS — IMO0002 Reserved for concepts with insufficient information to code with codable children: Secondary | ICD-10-CM

## 2014-04-15 DIAGNOSIS — E11319 Type 2 diabetes mellitus with unspecified diabetic retinopathy without macular edema: Secondary | ICD-10-CM

## 2014-04-15 DIAGNOSIS — E1165 Type 2 diabetes mellitus with hyperglycemia: Secondary | ICD-10-CM

## 2014-04-15 MED ORDER — IBUPROFEN 600 MG PO TABS: ORAL_TABLET | ORAL | Status: DC

## 2014-04-15 NOTE — Progress Notes (Signed)
   Subjective:    HPI   The patient presents for a follow-up of  chronic hypertension, chronic dyslipidemia, type 2 diabetes controlled with medicines.   F/u LE cramps B - chronic BP 120/70 at work   [employer] from Last 3 Encounters:  04/15/14 219 lb (99.338 kg)  04/05/14 215 lb (97.523 kg)  01/05/14 219 lb (99.338 kg)   BP Readings from Last 3 Encounters:  04/15/14 142/80  04/05/14 132/88  01/05/14 112/64       Review of Systems  Constitutional: Negative for activity change, appetite change and unexpected weight change.  HENT: Negative for mouth sores and sinus pressure.   Eyes: Negative for visual disturbance.  Respiratory: Negative for chest tightness.   Genitourinary: Negative for frequency, difficulty urinating and vaginal pain.  Musculoskeletal: Negative for back pain and gait problem.  Skin: Negative for pallor.  Neurological: Negative for dizziness and tremors.  Psychiatric/Behavioral: Positive for sleep disturbance. Negative for suicidal ideas and confusion. The patient is nervous/anxious.        Objective:   Physical Exam  Constitutional: She appears well-developed. No distress.  HENT:  Head: Normocephalic.  Right Ear: External ear normal.  Left Ear: External ear normal.  Nose: Nose normal.  Mouth/Throat: Oropharynx is clear and moist.  Eyes: Conjunctivae are normal. Pupils are equal, round, and reactive to light. Right eye exhibits no discharge. Left eye exhibits no discharge.  Neck: Normal range of motion. Neck supple. No JVD present. No tracheal deviation present. No thyromegaly present.  Cardiovascular: Normal rate, regular rhythm and normal heart sounds.   Pulmonary/Chest: No stridor. No respiratory distress. She has no wheezes.  Abdominal: Soft. Bowel sounds are normal. She exhibits no distension and no mass. There is no tenderness. There is no rebound and no guarding.  Musculoskeletal: She exhibits no edema or tenderness.  Lymphadenopathy:   She has no cervical adenopathy.  Neurological: She displays normal reflexes. No cranial nerve deficit. She exhibits normal muscle tone. Coordination normal.  Skin: No rash noted. No erythema.  Psychiatric: She has a normal mood and affect. Her behavior is normal. Judgment and thought content normal.  Not sad    Lab Results  Component Value Date   WBC 6.2 03/01/2010   HGB 11.1* 03/01/2010   HCT 33.1* 03/01/2010   PLT 244.0 03/01/2010   GLUCOSE 163* 04/05/2014   CHOL 176 04/05/2014   TRIG 64.0 04/05/2014   HDL 53.10 04/05/2014   LDLDIRECT 146.5 09/18/2012   LDLCALC 110* 04/05/2014   ALT 12 04/05/2014   AST 11 04/05/2014   NA 138 04/05/2014   K 4.8 04/05/2014   CL 106 04/05/2014   CREATININE 0.95 04/05/2014   BUN 15 04/05/2014   CO2 26 04/05/2014   TSH 2.31 03/01/2010   HGBA1C 7.2* 04/05/2014         Assessment & Plan:  Patient ID: Katelyn Peterson, female   DOB: 23-Mar-1945, 69 y.o.   MRN: [id number]

## 2014-04-15 NOTE — Assessment & Plan Note (Signed)
F/u w/Dr Gordon reviewed

## 2014-04-15 NOTE — Assessment & Plan Note (Signed)
F/u w/Dr Gherghe 

## 2014-04-15 NOTE — Telephone Encounter (Signed)
I'm aware Stay w/600 mg 800 mg are hard on the kidneys Thx

## 2014-04-15 NOTE — Assessment & Plan Note (Signed)
Continue with current prescription therapy as reflected on the Med list.  

## 2014-04-15 NOTE — Telephone Encounter (Signed)
Called pt no answer LMOM with md response.../lmb 

## 2014-04-15 NOTE — Assessment & Plan Note (Signed)
Ibuprofen prn Rx

## 2014-04-15 NOTE — Assessment & Plan Note (Signed)
On a low carb diet

## 2014-04-15 NOTE — Progress Notes (Signed)
Pre visit review using our clinic review tool, if applicable. No additional management support is needed unless otherwise documented below in the visit note. 

## 2014-04-15 NOTE — Telephone Encounter (Signed)
Patient was prescribed 600 MG ibprofin but she was requesting 800MG . Please advise. i will contact patient if you would like

## 2014-04-16 ENCOUNTER — Telehealth: Payer: Self-pay | Admitting: Internal Medicine

## 2014-04-16 NOTE — Telephone Encounter (Signed)
emmi emailed °

## 2014-05-25 ENCOUNTER — Telehealth: Payer: Self-pay | Admitting: Internal Medicine

## 2014-05-25 DIAGNOSIS — E1165 Type 2 diabetes mellitus with hyperglycemia: Principal | ICD-10-CM

## 2014-05-25 DIAGNOSIS — E11319 Type 2 diabetes mellitus with unspecified diabetic retinopathy without macular edema: Secondary | ICD-10-CM

## 2014-05-25 DIAGNOSIS — IMO0002 Reserved for concepts with insufficient information to code with codable children: Secondary | ICD-10-CM

## 2014-05-25 MED ORDER — SITAGLIPTIN PHOSPHATE 100 MG PO TABS: 100.0000 mg | ORAL_TABLET | Freq: Every day | ORAL | Status: DC

## 2014-05-25 NOTE — Telephone Encounter (Signed)
Patient need refill of Januvia 100 mg

## 2014-05-25 NOTE — Telephone Encounter (Signed)
Done

## 2014-07-01 ENCOUNTER — Encounter: Payer: Self-pay | Admitting: Internal Medicine

## 2014-07-06 ENCOUNTER — Encounter: Payer: Self-pay | Admitting: Internal Medicine

## 2014-07-06 ENCOUNTER — Ambulatory Visit (INDEPENDENT_AMBULATORY_CARE_PROVIDER_SITE_OTHER): Payer: 59 | Admitting: Internal Medicine

## 2014-07-06 VITALS — BP 112/60 | HR 81 | Temp 97.8°F | Resp 12 | Wt 212.6 lb

## 2014-07-06 DIAGNOSIS — E113599 Type 2 diabetes mellitus with proliferative diabetic retinopathy without macular edema, unspecified eye: Secondary | ICD-10-CM

## 2014-07-06 DIAGNOSIS — E11359 Type 2 diabetes mellitus with proliferative diabetic retinopathy without macular edema: Secondary | ICD-10-CM | POA: Diagnosis not present

## 2014-07-06 DIAGNOSIS — E785 Hyperlipidemia, unspecified: Secondary | ICD-10-CM | POA: Diagnosis not present

## 2014-07-06 LAB — HEMOGLOBIN A1C: Hgb A1c MFr Bld: 6.8 % — ABNORMAL HIGH (ref 4.6–6.5)

## 2014-07-06 NOTE — Patient Instructions (Signed)
Please continue metformin at 1000 mg twice a day Continue Januvia at the 100 mg daily  Please stop at the lab.  Please come back for a follow-up appointment in 3 months.

## 2014-07-06 NOTE — Progress Notes (Signed)
Patient ID: Katelyn Peterson, female   DOB: 1945/03/13, 69 y.o.   MRN: [id number]  HPI: Katelyn Peterson is a 69 y.o.-year-old female, returning for f/u for DM2, dx 2000, non-insulin-dependent, uncontrolled, with complications (DR). Last visit 3 mo ago.  She cut back on her breads, sweets, sodas >> lost 7 lns in last 2 months.  Last hemoglobin A1c was: Lab Results  Component Value Date   HGBA1C 7.2* 04/05/2014   HGBA1C 7.1* 01/05/2014   HGBA1C 7.2* 10/05/2013   Pt is on a regimen of: - Metformin 1000 mg po bid - Januvia 100 mg qd We stopped Amaryl 2 mg bid and Actos 30 mg daily for daily hypoglycemia events and possible SEs, respectively.  Pt checks her sugars 1-2x a day and they are all at goal: - am: 90-110 >> 91-133 >> 120s >> n/c >> 100-120 >> 93, 105 >> n/c >> 107 - 2h after b'fast: 89-130 >> 96 >> 108, 109 - before lunch:85-133 >> n/c >> 93-140 >> n/c >> 114-120 >> 68-116 >> 84-109 - 2h after lunch:120-125 >> 81-141 >> 78-123 >> 83-122, 136, 151 >> 97-121, 138 >> 84-117, 137 - before dinner: ~120 >> 78-127 >> n/c >> 84-110 >> 72-118 - 2h after dinner: 138 >> n/c >> 125 - bedtime: not checking, but not feeling hypoglycemic >> 100-120 >> n/c Lowest sugar was 68;  she has hypoglycemia awareness at 80. Highest sugar was 137.  - no CKD, last BUN/creatinine:  Lab Results  Component Value Date   BUN 15 04/05/2014   CREATININE 0.95 04/05/2014  She is on quinapril 44.  - last set of lipids: Lab Results  Component Value Date   CHOL 176 04/05/2014   HDL 53.10 04/05/2014   LDLCALC 110* 04/05/2014   LDLDIRECT 146.5 09/18/2012   TRIG 64.0 04/05/2014   CHOLHDL 3 04/05/2014  She developed leg cramps with pravastatin and atorvastatin. She started Crestor 5 weekly.  - last eye exam was 11/13/2013. 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 - no numbness and tingling in her feet.  She also has a history of hypertension, hyperlipidemia (I recommended to retry statins, but did not do this),  obesity-has been recommended Belviq, but too expensive. She has been considered for lap band surgery. She also has bilateral chronic lower extremity cramps, right knee pain, anemia.  ROS: Constitutional: + weight loss, no fatigue, no subjective hyperthermia/hypothermia Eyes: no blurry vision, no xerophthalmia ENT: no sore throat, no nodules palpated in throat, no dysphagia/odynophagia, no hoarseness Cardiovascular: no CP/SOB/palpitations/leg swelling Respiratory: no cough/SOB Gastrointestinal: no N/V/D/C Musculoskeletal: no muscle/no joint aches  Skin: no rashes Neurological: no tremors/numbness/tingling/dizziness  I reviewed pt's medications, allergies, PMH, social hx, family hx, and changes were documented in the history of present illness. Otherwise, unchanged from my initial visit note.  PE: BP 112/60 mmHg  Pulse 81  Temp(Src) 97.8 F (36.6 C) (Oral)  Resp 12  Wt 212 lb 9.6 oz (96.435 kg)  SpO2 96% Body mass index is 35.94 kg/(m^2).  Wt Readings from Last 3 Encounters:  07/06/14 212 lb 9.6 oz (96.435 kg)  04/15/14 219 lb (99.338 kg)  04/05/14 215 lb (97.523 kg)   Constitutional: obese, in NAD Eyes: PERRLA, EOMI, no exophthalmos ENT: moist mucous membranes, no thyromegaly, no cervical lymphadenopathy Cardiovascular: RRR, No MRG, + periankle swelling B Respiratory: CTA B Gastrointestinal: abdomen soft, NT, ND, BS+ Musculoskeletal: no deformities, strength intact in all 4 Skin: moist, warm, no rashes  ASSESSMENT: 1. DM2, non-insulin-dependent, controlled, with complications -  DR  2. Hyperlipidemia - Developed leg cramps with pravastatin and atorvastatin - started Crestor   3. Goiter 11/04/2012: Thyroid U/S:  Right thyroid lobe: 4.3 x 1.0 x 1.8 cm   Left thyroid lobe: 4.6 x 1.2 x 1.9 cm   Isthmus: 5 mm   Focal nodules: Thyroid echotexture is homogeneous, without solid or cystic lesion.   Lymphadenopathy: None visualized.  IMPRESSION:  Normal thyroid  ultrasound. No evidence of thyromegaly or dominant solid/cystic mass.  PLAN:  1. DM2 Patient with long-standing, now controlled diabetes, on oral antidiabetic regimen (previously with hypoglycemia, now with all CBGs at goal). - I suggested to:  Continue metformin at 1000 mg twice a day Continue Januvia at the 100 mg daily - continue checking her sugars at different times of the day, include some bedtime CBGs - Will check hemoglobin A1c today.  - given new log - Return to clinic in 3 months with sugar log   2. Hyperlipidemia - on statin: Crestor 5 mg weekly >> continue this dose - Latest Lipid panel from 03/2014 reviewed with the pt: improved  Office Visit on 07/06/2014  Component Date Value Ref Range Status  . Hgb A1c MFr Bld 07/06/2014 6.8* 4.6 - 6.5 % Final   Glycemic Control Guidelines for People with Diabetes:Non Diabetic:  <6%Goal of Therapy: <7%Additional Action Suggested:  >8%    Improved HbA1c.

## 2014-08-16 ENCOUNTER — Encounter: Payer: Self-pay | Admitting: Internal Medicine

## 2014-08-16 ENCOUNTER — Ambulatory Visit (INDEPENDENT_AMBULATORY_CARE_PROVIDER_SITE_OTHER): Payer: 59 | Admitting: Internal Medicine

## 2014-08-16 VITALS — BP 140/70 | HR 66 | Ht 64.0 in | Wt 210.0 lb

## 2014-08-16 DIAGNOSIS — Z0000 Encounter for general adult medical examination without abnormal findings: Secondary | ICD-10-CM | POA: Insufficient documentation

## 2014-08-16 NOTE — Patient Instructions (Signed)
Preventive Care for Adults A healthy lifestyle and preventive care can promote health and wellness. Preventive health guidelines for women include the following key practices.  A routine yearly physical is a good way to check with your health care provider about your health and preventive screening. It is a chance to share any concerns and updates on your health and to receive a thorough exam.  Visit your dentist for a routine exam and preventive care every 6 months. Brush your teeth twice a day and floss once a day. Good oral hygiene prevents tooth decay and gum disease.  The frequency of eye exams is based on your age, health, family medical history, use of contact lenses, and other factors. Follow your health care provider's recommendations for frequency of eye exams.  Eat a healthy diet. Foods like vegetables, fruits, whole grains, low-fat dairy products, and lean protein foods contain the nutrients you need without too many calories. Decrease your intake of foods high in solid fats, added sugars, and salt. Eat the right amount of calories for you.Get information about a proper diet from your health care provider, if necessary.  Regular physical exercise is one of the most important things you can do for your health. Most adults should get at least 150 minutes of moderate-intensity exercise (any activity that increases your heart rate and causes you to sweat) each week. In addition, most adults need muscle-strengthening exercises on 2 or more days a week.  Maintain a healthy weight. The body mass index (BMI) is a screening tool to identify possible weight problems. It provides an estimate of body fat based on height and weight. Your health care provider can find your BMI and can help you achieve or maintain a healthy weight.For adults 20 years and older:  A BMI below 18.5 is considered underweight.  A BMI of 18.5 to 24.9 is normal.  A BMI of 25 to 29.9 is considered overweight.  A BMI of  30 and above is considered obese.  Maintain normal blood lipids and cholesterol levels by exercising and minimizing your intake of saturated fat. Eat a balanced diet with plenty of fruit and vegetables. Blood tests for lipids and cholesterol should begin at age 76 and be repeated every 5 years. If your lipid or cholesterol levels are high, you are over 50, or you are at high risk for heart disease, you may need your cholesterol levels checked more frequently.Ongoing high lipid and cholesterol levels should be treated with medicines if diet and exercise are not working.  If you smoke, find out from your health care provider how to quit. If you do not use tobacco, do not start.  Lung cancer screening is recommended for adults aged 22-80 years who are at high risk for developing lung cancer because of a history of smoking. A yearly low-dose CT scan of the lungs is recommended for people who have at least a 30-pack-year history of smoking and are a current smoker or have quit within the past 15 years. A pack year of smoking is smoking an average of 1 pack of cigarettes a day for 1 year (for example: 1 pack a day for 30 years or 2 packs a day for 15 years). Yearly screening should continue until the smoker has stopped smoking for at least 15 years. Yearly screening should be stopped for people who develop a health problem that would prevent them from having lung cancer treatment.  If you are pregnant, do not drink alcohol. If you are breastfeeding,  be very cautious about drinking alcohol. If you are not pregnant and choose to drink alcohol, do not have more than 1 drink per day. One drink is considered to be 12 ounces (355 mL) of beer, 5 ounces (148 mL) of wine, or 1.5 ounces (44 mL) of liquor.  Avoid use of street drugs. Do not share needles with anyone. Ask for help if you need support or instructions about stopping the use of drugs.  High blood pressure causes heart disease and increases the risk of  stroke. Your blood pressure should be checked at least every 1 to 2 years. Ongoing high blood pressure should be treated with medicines if weight loss and exercise do not work.  If you are 75-52 years old, ask your health care provider if you should take aspirin to prevent strokes.  Diabetes screening involves taking a blood sample to check your fasting blood sugar level. This should be done once every 3 years, after age 15, if you are within normal weight and without risk factors for diabetes. Testing should be considered at a younger age or be carried out more frequently if you are overweight and have at least 1 risk factor for diabetes.  Breast cancer screening is essential preventive care for women. You should practice "breast self-awareness." This means understanding the normal appearance and feel of your breasts and may include breast self-examination. Any changes detected, no matter how small, should be reported to a health care provider. Women in their 58s and 30s should have a clinical breast exam (CBE) by a health care provider as part of a regular health exam every 1 to 3 years. After age 16, women should have a CBE every year. Starting at age 53, women should consider having a mammogram (breast X-ray test) every year. Women who have a family history of breast cancer should talk to their health care provider about genetic screening. Women at a high risk of breast cancer should talk to their health care providers about having an MRI and a mammogram every year.  Breast cancer gene (BRCA)-related cancer risk assessment is recommended for women who have family members with BRCA-related cancers. BRCA-related cancers include breast, ovarian, tubal, and peritoneal cancers. Having family members with these cancers may be associated with an increased risk for harmful changes (mutations) in the breast cancer genes BRCA1 and BRCA2. Results of the assessment will determine the need for genetic counseling and  BRCA1 and BRCA2 testing.  Routine pelvic exams to screen for cancer are no longer recommended for nonpregnant women who are considered low risk for cancer of the pelvic organs (ovaries, uterus, and vagina) and who do not have symptoms. Ask your health care provider if a screening pelvic exam is right for you.  If you have had past treatment for cervical cancer or a condition that could lead to cancer, you need Pap tests and screening for cancer for at least 20 years after your treatment. If Pap tests have been discontinued, your risk factors (such as having a new sexual partner) need to be reassessed to determine if screening should be resumed. Some women have medical problems that increase the chance of getting cervical cancer. In these cases, your health care provider may recommend more frequent screening and Pap tests.  The HPV test is an additional test that may be used for cervical cancer screening. The HPV test looks for the virus that can cause the cell changes on the cervix. The cells collected during the Pap test can be  tested for HPV. The HPV test could be used to screen women aged 30 years and older, and should be used in women of any age who have unclear Pap test results. After the age of 30, women should have HPV testing at the same frequency as a Pap test.  Colorectal cancer can be detected and often prevented. Most routine colorectal cancer screening begins at the age of 50 years and continues through age 75 years. However, your health care provider may recommend screening at an earlier age if you have risk factors for colon cancer. On a yearly basis, your health care provider may provide home test kits to check for hidden blood in the stool. Use of a small camera at the end of a tube, to directly examine the colon (sigmoidoscopy or colonoscopy), can detect the earliest forms of colorectal cancer. Talk to your health care provider about this at age 50, when routine screening begins. Direct  exam of the colon should be repeated every 5-10 years through age 75 years, unless early forms of pre-cancerous polyps or small growths are found.  People who are at an increased risk for hepatitis B should be screened for this virus. You are considered at high risk for hepatitis B if:  You were born in a country where hepatitis B occurs often. Talk with your health care provider about which countries are considered high risk.  Your parents were born in a high-risk country and you have not received a shot to protect against hepatitis B (hepatitis B vaccine).  You have HIV or AIDS.  You use needles to inject street drugs.  You live with, or have sex with, someone who has hepatitis B.  You get hemodialysis treatment.  You take certain medicines for conditions like cancer, organ transplantation, and autoimmune conditions.  Hepatitis C blood testing is recommended for all people born from 1945 through 1965 and any individual with known risks for hepatitis C.  Practice safe sex. Use condoms and avoid high-risk sexual practices to reduce the spread of sexually transmitted infections (STIs). STIs include gonorrhea, chlamydia, syphilis, trichomonas, herpes, HPV, and human immunodeficiency virus (HIV). Herpes, HIV, and HPV are viral illnesses that have no cure. They can result in disability, cancer, and death.  You should be screened for sexually transmitted illnesses (STIs) including gonorrhea and chlamydia if:  You are sexually active and are younger than 24 years.  You are older than 24 years and your health care provider tells you that you are at risk for this type of infection.  Your sexual activity has changed since you were last screened and you are at an increased risk for chlamydia or gonorrhea. Ask your health care provider if you are at risk.  If you are at risk of being infected with HIV, it is recommended that you take a prescription medicine daily to prevent HIV infection. This is  called preexposure prophylaxis (PrEP). You are considered at risk if:  You are a heterosexual woman, are sexually active, and are at increased risk for HIV infection.  You take drugs by injection.  You are sexually active with a partner who has HIV.  Talk with your health care provider about whether you are at high risk of being infected with HIV. If you choose to begin PrEP, you should first be tested for HIV. You should then be tested every 3 months for as long as you are taking PrEP.  Osteoporosis is a disease in which the bones lose minerals and strength   with aging. This can result in serious bone fractures or breaks. The risk of osteoporosis can be identified using a bone density scan. Women ages 65 years and over and women at risk for fractures or osteoporosis should discuss screening with their health care providers. Ask your health care provider whether you should take a calcium supplement or vitamin D to reduce the rate of osteoporosis.  Menopause can be associated with physical symptoms and risks. Hormone replacement therapy is available to decrease symptoms and risks. You should talk to your health care provider about whether hormone replacement therapy is right for you.  Use sunscreen. Apply sunscreen liberally and repeatedly throughout the day. You should seek shade when your shadow is shorter than you. Protect yourself by wearing long sleeves, pants, a wide-brimmed hat, and sunglasses year round, whenever you are outdoors.  Once a month, do a whole body skin exam, using a mirror to look at the skin on your back. Tell your health care provider of new moles, moles that have irregular borders, moles that are larger than a pencil eraser, or moles that have changed in shape or color.  Stay current with required vaccines (immunizations).  Influenza vaccine. All adults should be immunized every year.  Tetanus, diphtheria, and acellular pertussis (Td, Tdap) vaccine. Pregnant women should  receive 1 dose of Tdap vaccine during each pregnancy. The dose should be obtained regardless of the length of time since the last dose. Immunization is preferred during the 27th-36th week of gestation. An adult who has not previously received Tdap or who does not know her vaccine status should receive 1 dose of Tdap. This initial dose should be followed by tetanus and diphtheria toxoids (Td) booster doses every 10 years. Adults with an unknown or incomplete history of completing a 3-dose immunization series with Td-containing vaccines should begin or complete a primary immunization series including a Tdap dose. Adults should receive a Td booster every 10 years.  Varicella vaccine. An adult without evidence of immunity to varicella should receive 2 doses or a second dose if she has previously received 1 dose. Pregnant females who do not have evidence of immunity should receive the first dose after pregnancy. This first dose should be obtained before leaving the health care facility. The second dose should be obtained 4-8 weeks after the first dose.  Human papillomavirus (HPV) vaccine. Females aged 13-26 years who have not received the vaccine previously should obtain the 3-dose series. The vaccine is not recommended for use in pregnant females. However, pregnancy testing is not needed before receiving a dose. If a female is found to be pregnant after receiving a dose, no treatment is needed. In that case, the remaining doses should be delayed until after the pregnancy. Immunization is recommended for any person with an immunocompromised condition through the age of 26 years if she did not get any or all doses earlier. During the 3-dose series, the second dose should be obtained 4-8 weeks after the first dose. The third dose should be obtained 24 weeks after the first dose and 16 weeks after the second dose.  Zoster vaccine. One dose is recommended for adults aged 60 years or older unless certain conditions are  present.  Measles, mumps, and rubella (MMR) vaccine. Adults born before 1957 generally are considered immune to measles and mumps. Adults born in 1957 or later should have 1 or more doses of MMR vaccine unless there is a contraindication to the vaccine or there is laboratory evidence of immunity to   each of the three diseases. A routine second dose of MMR vaccine should be obtained at least 28 days after the first dose for students attending postsecondary schools, health care workers, or international travelers. People who received inactivated measles vaccine or an unknown type of measles vaccine during 1963-1967 should receive 2 doses of MMR vaccine. People who received inactivated mumps vaccine or an unknown type of mumps vaccine before 1979 and are at high risk for mumps infection should consider immunization with 2 doses of MMR vaccine. For females of childbearing age, rubella immunity should be determined. If there is no evidence of immunity, females who are not pregnant should be vaccinated. If there is no evidence of immunity, females who are pregnant should delay immunization until after pregnancy. Unvaccinated health care workers born before 1957 who lack laboratory evidence of measles, mumps, or rubella immunity or laboratory confirmation of disease should consider measles and mumps immunization with 2 doses of MMR vaccine or rubella immunization with 1 dose of MMR vaccine.  Pneumococcal 13-valent conjugate (PCV13) vaccine. When indicated, a person who is uncertain of her immunization history and has no record of immunization should receive the PCV13 vaccine. An adult aged 19 years or older who has certain medical conditions and has not been previously immunized should receive 1 dose of PCV13 vaccine. This PCV13 should be followed with a dose of pneumococcal polysaccharide (PPSV23) vaccine. The PPSV23 vaccine dose should be obtained at least 8 weeks after the dose of PCV13 vaccine. An adult aged 19  years or older who has certain medical conditions and previously received 1 or more doses of PPSV23 vaccine should receive 1 dose of PCV13. The PCV13 vaccine dose should be obtained 1 or more years after the last PPSV23 vaccine dose.  Pneumococcal polysaccharide (PPSV23) vaccine. When PCV13 is also indicated, PCV13 should be obtained first. All adults aged 65 years and older should be immunized. An adult younger than age 65 years who has certain medical conditions should be immunized. Any person who resides in a nursing home or long-term care facility should be immunized. An adult smoker should be immunized. People with an immunocompromised condition and certain other conditions should receive both PCV13 and PPSV23 vaccines. People with human immunodeficiency virus (HIV) infection should be immunized as soon as possible after diagnosis. Immunization during chemotherapy or radiation therapy should be avoided. Routine use of PPSV23 vaccine is not recommended for American Indians, [place] Natives, or people younger than 65 years unless there are medical conditions that require PPSV23 vaccine. When indicated, people who have unknown immunization and have no record of immunization should receive PPSV23 vaccine. One-time revaccination 5 years after the first dose of PPSV23 is recommended for people aged 19-64 years who have chronic kidney failure, nephrotic syndrome, asplenia, or immunocompromised conditions. People who received 1-2 doses of PPSV23 before age 65 years should receive another dose of PPSV23 vaccine at age 65 years or later if at least 5 years have passed since the previous dose. Doses of PPSV23 are not needed for people immunized with PPSV23 at or after age 65 years.  Meningococcal vaccine. Adults with asplenia or persistent complement component deficiencies should receive 2 doses of quadrivalent meningococcal conjugate (MenACWY-D) vaccine. The doses should be obtained at least 2 months apart.  Microbiologists working with certain meningococcal bacteria, military recruits, people at risk during an outbreak, and people who travel to or live in countries with a high rate of meningitis should be immunized. A first-year college student up through age   21 years who is living in a residence hall should receive a dose if she did not receive a dose on or after her 16th birthday. Adults who have certain high-risk conditions should receive one or more doses of vaccine.  Hepatitis A vaccine. Adults who wish to be protected from this disease, have certain high-risk conditions, work with hepatitis A-infected animals, work in hepatitis A research labs, or travel to or work in countries with a high rate of hepatitis A should be immunized. Adults who were previously unvaccinated and who anticipate close contact with an international adoptee during the first 60 days after arrival in the [place] from a country with a high rate of hepatitis A should be immunized.  Hepatitis B vaccine. Adults who wish to be protected from this disease, have certain high-risk conditions, may be exposed to blood or other infectious body fluids, are household contacts or sex partners of hepatitis B positive people, are clients or workers in certain care facilities, or travel to or work in countries with a high rate of hepatitis B should be immunized.  Haemophilus influenzae type b (Hib) vaccine. A previously unvaccinated person with asplenia or sickle cell disease or having a scheduled splenectomy should receive 1 dose of Hib vaccine. Regardless of previous immunization, a recipient of a hematopoietic stem cell transplant should receive a 3-dose series 6-12 months after her successful transplant. Hib vaccine is not recommended for adults with HIV infection. Preventive Services / Frequency Ages 64 to 68 years  Blood pressure check.** / Every 1 to 2 years.  Lipid and cholesterol check.** / Every 5 years beginning at age  22.  Clinical breast exam.** / Every 3 years for women in their 88s and 53s.  BRCA-related cancer risk assessment.** / For women who have family members with a BRCA-related cancer (breast, ovarian, tubal, or peritoneal cancers).  Pap test.** / Every 2 years from ages 90 through 51. Every 3 years starting at age 21 through age 56 or 3 with a history of 3 consecutive normal Pap tests.  HPV screening.** / Every 3 years from ages 24 through ages 1 to 46 with a history of 3 consecutive normal Pap tests.  Hepatitis C blood test.** / For any individual with known risks for hepatitis C.  Skin self-exam. / Monthly.  Influenza vaccine. / Every year.  Tetanus, diphtheria, and acellular pertussis (Tdap, Td) vaccine.** / Consult your health care provider. Pregnant women should receive 1 dose of Tdap vaccine during each pregnancy. 1 dose of Td every 10 years.  Varicella vaccine.** / Consult your health care provider. Pregnant females who do not have evidence of immunity should receive the first dose after pregnancy.  HPV vaccine. / 3 doses over 6 months, if 72 and younger. The vaccine is not recommended for use in pregnant females. However, pregnancy testing is not needed before receiving a dose.  Measles, mumps, rubella (MMR) vaccine.** / You need at least 1 dose of MMR if you were born in 1957 or later. You may also need a 2nd dose. For females of childbearing age, rubella immunity should be determined. If there is no evidence of immunity, females who are not pregnant should be vaccinated. If there is no evidence of immunity, females who are pregnant should delay immunization until after pregnancy.  Pneumococcal 13-valent conjugate (PCV13) vaccine.** / Consult your health care provider.  Pneumococcal polysaccharide (PPSV23) vaccine.** / 1 to 2 doses if you smoke cigarettes or if you have certain conditions.  Meningococcal vaccine.** /  1 dose if you are age 19 to 21 years and a first-year college  student living in a residence hall, or have one of several medical conditions, you need to get vaccinated against meningococcal disease. You may also need additional booster doses.  Hepatitis A vaccine.** / Consult your health care provider.  Hepatitis B vaccine.** / Consult your health care provider.  Haemophilus influenzae type b (Hib) vaccine.** / Consult your health care provider. Ages 40 to 64 years  Blood pressure check.** / Every 1 to 2 years.  Lipid and cholesterol check.** / Every 5 years beginning at age 20 years.  Lung cancer screening. / Every year if you are aged 55-80 years and have a 30-pack-year history of smoking and currently smoke or have quit within the past 15 years. Yearly screening is stopped once you have quit smoking for at least 15 years or develop a health problem that would prevent you from having lung cancer treatment.  Clinical breast exam.** / Every year after age 40 years.  BRCA-related cancer risk assessment.** / For women who have family members with a BRCA-related cancer (breast, ovarian, tubal, or peritoneal cancers).  Mammogram.** / Every year beginning at age 40 years and continuing for as long as you are in good health. Consult with your health care provider.  Pap test.** / Every 3 years starting at age 30 years through age 65 or 70 years with a history of 3 consecutive normal Pap tests.  HPV screening.** / Every 3 years from ages 30 years through ages 65 to 70 years with a history of 3 consecutive normal Pap tests.  Fecal occult blood test (FOBT) of stool. / Every year beginning at age 50 years and continuing until age 75 years. You may not need to do this test if you get a colonoscopy every 10 years.  Flexible sigmoidoscopy or colonoscopy.** / Every 5 years for a flexible sigmoidoscopy or every 10 years for a colonoscopy beginning at age 50 years and continuing until age 75 years.  Hepatitis C blood test.** / For all people born from 1945 through  1965 and any individual with known risks for hepatitis C.  Skin self-exam. / Monthly.  Influenza vaccine. / Every year.  Tetanus, diphtheria, and acellular pertussis (Tdap/Td) vaccine.** / Consult your health care provider. Pregnant women should receive 1 dose of Tdap vaccine during each pregnancy. 1 dose of Td every 10 years.  Varicella vaccine.** / Consult your health care provider. Pregnant females who do not have evidence of immunity should receive the first dose after pregnancy.  Zoster vaccine.** / 1 dose for adults aged 60 years or older.  Measles, mumps, rubella (MMR) vaccine.** / You need at least 1 dose of MMR if you were born in 1957 or later. You may also need a 2nd dose. For females of childbearing age, rubella immunity should be determined. If there is no evidence of immunity, females who are not pregnant should be vaccinated. If there is no evidence of immunity, females who are pregnant should delay immunization until after pregnancy.  Pneumococcal 13-valent conjugate (PCV13) vaccine.** / Consult your health care provider.  Pneumococcal polysaccharide (PPSV23) vaccine.** / 1 to 2 doses if you smoke cigarettes or if you have certain conditions.  Meningococcal vaccine.** / Consult your health care provider.  Hepatitis A vaccine.** / Consult your health care provider.  Hepatitis B vaccine.** / Consult your health care provider.  Haemophilus influenzae type b (Hib) vaccine.** / Consult your health care provider. Ages 65   years and over  Blood pressure check.** / Every 1 to 2 years.  Lipid and cholesterol check.** / Every 5 years beginning at age 22 years.  Lung cancer screening. / Every year if you are aged 73-80 years and have a 30-pack-year history of smoking and currently smoke or have quit within the past 15 years. Yearly screening is stopped once you have quit smoking for at least 15 years or develop a health problem that would prevent you from having lung cancer  treatment.  Clinical breast exam.** / Every year after age 4 years.  BRCA-related cancer risk assessment.** / For women who have family members with a BRCA-related cancer (breast, ovarian, tubal, or peritoneal cancers).  Mammogram.** / Every year beginning at age 40 years and continuing for as long as you are in good health. Consult with your health care provider.  Pap test.** / Every 3 years starting at age 9 years through age 34 or 91 years with 3 consecutive normal Pap tests. Testing can be stopped between 65 and 70 years with 3 consecutive normal Pap tests and no abnormal Pap or HPV tests in the past 10 years.  HPV screening.** / Every 3 years from ages 57 years through ages 64 or 45 years with a history of 3 consecutive normal Pap tests. Testing can be stopped between 65 and 70 years with 3 consecutive normal Pap tests and no abnormal Pap or HPV tests in the past 10 years.  Fecal occult blood test (FOBT) of stool. / Every year beginning at age 15 years and continuing until age 17 years. You may not need to do this test if you get a colonoscopy every 10 years.  Flexible sigmoidoscopy or colonoscopy.** / Every 5 years for a flexible sigmoidoscopy or every 10 years for a colonoscopy beginning at age 86 years and continuing until age 71 years.  Hepatitis C blood test.** / For all people born from [birth year] through 1964 and any individual with known risks for hepatitis C.  Osteoporosis screening.** / A one-time screening for women ages 83 years and over and women at risk for fractures or osteoporosis.  Skin self-exam. / Monthly.  Influenza vaccine. / Every year.  Tetanus, diphtheria, and acellular pertussis (Tdap/Td) vaccine.** / 1 dose of Td every 10 years.  Varicella vaccine.** / Consult your health care provider.  Zoster vaccine.** / 1 dose for adults aged 61 years or older.  Pneumococcal 13-valent conjugate (PCV13) vaccine.** / Consult your health care provider.  Pneumococcal  polysaccharide (PPSV23) vaccine.** / 1 dose for all adults aged 28 years and older.  Meningococcal vaccine.** / Consult your health care provider.  Hepatitis A vaccine.** / Consult your health care provider.  Hepatitis B vaccine.** / Consult your health care provider.  Haemophilus influenzae type b (Hib) vaccine.** / Consult your health care provider. ** Family history and personal history of risk and conditions may change your health care provider's recommendations. Document Released: 04/24/2001 Document Revised: 07/13/2013 Document Reviewed: 07/24/2010 [facility] Patient Information 2015 [place], [place]. This information is not intended to replace advice given to you by your health care provider. Make sure you discuss any questions you have with your health care provider.

## 2014-08-16 NOTE — Progress Notes (Signed)
Pre visit review using our clinic review tool, if applicable. No additional management support is needed unless otherwise documented below in the visit note. 

## 2014-08-16 NOTE — Progress Notes (Signed)
   Subjective:    HPI  The patient is here for a wellness exam. The patient has been doing well overall without major physical or psychological issues going on lately.  The patient presents for a follow-up of  chronic hypertension, chronic dyslipidemia, type 2 diabetes controlled with medicines.   F/u LE cramps B - chronic   Wt Readings from Last 3 Encounters:  08/16/14 210 lb (95.255 kg)  07/06/14 212 lb 9.6 oz (96.435 kg)  04/15/14 219 lb (99.338 kg)   BP Readings from Last 3 Encounters:  08/16/14 140/70  07/06/14 112/60  04/15/14 142/80       Review of Systems  Constitutional: Negative for activity change, appetite change and unexpected weight change.  HENT: Negative for mouth sores and sinus pressure.   Eyes: Negative for visual disturbance.  Respiratory: Negative for chest tightness.   Genitourinary: Negative for frequency, difficulty urinating and vaginal pain.  Musculoskeletal: Negative for back pain and gait problem.  Skin: Negative for pallor.  Neurological: Negative for dizziness and tremors.  Psychiatric/Behavioral: Positive for sleep disturbance. Negative for suicidal ideas and confusion. The patient is nervous/anxious.        Objective:   Physical Exam  Constitutional: She appears well-developed. No distress.  HENT:  Head: Normocephalic.  Right Ear: External ear normal.  Left Ear: External ear normal.  Nose: Nose normal.  Mouth/Throat: Oropharynx is clear and moist.  Eyes: Conjunctivae are normal. Pupils are equal, round, and reactive to light. Right eye exhibits no discharge. Left eye exhibits no discharge.  Neck: Normal range of motion. Neck supple. No JVD present. No tracheal deviation present. No thyromegaly present.  Cardiovascular: Normal rate, regular rhythm and normal heart sounds.   Pulmonary/Chest: No stridor. No respiratory distress. She has no wheezes.  Abdominal: Soft. Bowel sounds are normal. She exhibits no distension and no mass. There  is no tenderness. There is no rebound and no guarding.  Musculoskeletal: She exhibits no edema or tenderness.  Lymphadenopathy:    She has no cervical adenopathy.  Neurological: She displays normal reflexes. No cranial nerve deficit. She exhibits normal muscle tone. Coordination normal.  Skin: No rash noted. No erythema.  Psychiatric: She has a normal mood and affect. Her behavior is normal. Judgment and thought content normal.  Not sad    Lab Results  Component Value Date   WBC 6.2 03/01/2010   HGB 11.1* 03/01/2010   HCT 33.1* 03/01/2010   PLT 244.0 03/01/2010   GLUCOSE 163* 04/05/2014   CHOL 176 04/05/2014   TRIG 64.0 04/05/2014   HDL 53.10 04/05/2014   LDLDIRECT 146.5 09/18/2012   LDLCALC 110* 04/05/2014   ALT 12 04/05/2014   AST 11 04/05/2014   NA 138 04/05/2014   K 4.8 04/05/2014   CL 106 04/05/2014   CREATININE 0.95 04/05/2014   BUN 15 04/05/2014   CO2 26 04/05/2014   TSH 2.31 03/01/2010   HGBA1C 6.8* 07/06/2014         Assessment & Plan:  Patient ID: Katelyn Peterson, female   DOB: [birth year], 69 y.o.   MRN: [id number]

## 2014-08-16 NOTE — Assessment & Plan Note (Signed)
We discussed age appropriate health related issues, including available/recomended screening tests and vaccinations. We discussed a need for adhering to healthy diet and exercise. Labs/EKG were reviewed/ordered. All questions were answered.   

## 2014-10-11 ENCOUNTER — Ambulatory Visit: Payer: 59 | Admitting: Internal Medicine

## 2014-10-22 ENCOUNTER — Other Ambulatory Visit: Payer: Self-pay

## 2014-10-22 MED ORDER — CETIRIZINE HCL 10 MG PO TABS: ORAL_TABLET | ORAL | Status: DC

## 2014-10-22 NOTE — Telephone Encounter (Signed)
Rx request for Zyrtec 10 mg.  Please send to [place]. Ok per [place] to refill.

## 2014-11-03 ENCOUNTER — Other Ambulatory Visit (INDEPENDENT_AMBULATORY_CARE_PROVIDER_SITE_OTHER): Payer: 59

## 2014-11-03 DIAGNOSIS — E11319 Type 2 diabetes mellitus with unspecified diabetic retinopathy without macular edema: Secondary | ICD-10-CM | POA: Diagnosis not present

## 2014-11-03 DIAGNOSIS — E1165 Type 2 diabetes mellitus with hyperglycemia: Secondary | ICD-10-CM | POA: Diagnosis not present

## 2014-11-03 DIAGNOSIS — Z0000 Encounter for general adult medical examination without abnormal findings: Secondary | ICD-10-CM

## 2014-11-03 DIAGNOSIS — IMO0002 Reserved for concepts with insufficient information to code with codable children: Secondary | ICD-10-CM

## 2014-11-03 LAB — URINALYSIS, ROUTINE W REFLEX MICROSCOPIC
BILIRUBIN URINE: NEGATIVE
Hgb urine dipstick: NEGATIVE
KETONES UR: NEGATIVE
NITRITE: NEGATIVE
Specific Gravity, Urine: 1.015 (ref 1.000–1.030)
Total Protein, Urine: NEGATIVE
URINE GLUCOSE: NEGATIVE
UROBILINOGEN UA: 0.2 (ref 0.0–1.0)
pH: 6 (ref 5.0–8.0)

## 2014-11-03 LAB — HEMOGLOBIN A1C: Hgb A1c MFr Bld: 6.2 % (ref 4.6–6.5)

## 2014-11-03 LAB — HEPATIC FUNCTION PANEL
ALT: 12 U/L (ref 0–35)
AST: 11 U/L (ref 0–37)
Albumin: 3.9 g/dL (ref 3.5–5.2)
Alkaline Phosphatase: 63 U/L (ref 39–117)
BILIRUBIN TOTAL: 0.3 mg/dL (ref 0.2–1.2)
Bilirubin, Direct: 0 mg/dL (ref 0.0–0.3)
Total Protein: 7.3 g/dL (ref 6.0–8.3)

## 2014-11-03 LAB — LIPID PANEL
CHOLESTEROL: 221 mg/dL — AB (ref 0–200)
HDL: 50.2 mg/dL (ref >39.00)
LDL Cholesterol: 154 mg/dL — ABNORMAL HIGH (ref 0–99)
NonHDL: 170.43
TRIGLYCERIDES: 81 mg/dL (ref 0.0–149.0)
Total CHOL/HDL Ratio: 4
VLDL: 16.2 mg/dL (ref 0.0–40.0)

## 2014-11-03 LAB — CBC WITH DIFFERENTIAL/PLATELET
BASOS ABS: 0 10*3/uL (ref 0.0–0.1)
Basophils Relative: 0.8 % (ref 0.0–3.0)
EOS ABS: 0.3 10*3/uL (ref 0.0–0.7)
Eosinophils Relative: 5.2 % — ABNORMAL HIGH (ref 0.0–5.0)
HCT: 34.7 % — ABNORMAL LOW (ref 36.0–46.0)
Hemoglobin: 11.5 g/dL — ABNORMAL LOW (ref 12.0–15.0)
LYMPHS ABS: 1.5 10*3/uL (ref 0.7–4.0)
Lymphocytes Relative: 26.1 % (ref 12.0–46.0)
MCHC: 33.3 g/dL (ref 30.0–36.0)
MCV: 91.4 fl (ref 78.0–100.0)
MONO ABS: 0.4 10*3/uL (ref 0.1–1.0)
MONOS PCT: 6 % (ref 3.0–12.0)
NEUTROS PCT: 61.9 % (ref 43.0–77.0)
Neutro Abs: 3.6 10*3/uL (ref 1.4–7.7)
Platelets: 268 10*3/uL (ref 150.0–400.0)
RBC: 3.79 Mil/uL — AB (ref 3.87–5.11)
RDW: 13.8 % (ref 11.5–15.5)
WBC: 5.9 10*3/uL (ref 4.0–10.5)

## 2014-11-03 LAB — BASIC METABOLIC PANEL
BUN: 20 mg/dL (ref 6–23)
CALCIUM: 9.5 mg/dL (ref 8.4–10.5)
CO2: 23 mEq/L (ref 19–32)
CREATININE: 0.95 mg/dL (ref 0.40–1.20)
Chloride: 107 mEq/L (ref 96–112)
GFR: 75.09 mL/min (ref >60.00)
GLUCOSE: 126 mg/dL — AB (ref 70–99)
Potassium: 4.3 mEq/L (ref 3.5–5.1)
SODIUM: 139 meq/L (ref 135–145)

## 2014-11-03 LAB — TSH: TSH: 2.15 u[IU]/mL (ref 0.35–4.50)

## 2014-11-04 ENCOUNTER — Ambulatory Visit (INDEPENDENT_AMBULATORY_CARE_PROVIDER_SITE_OTHER): Payer: 59 | Admitting: Internal Medicine

## 2014-11-04 ENCOUNTER — Other Ambulatory Visit: Payer: Self-pay | Admitting: *Deleted

## 2014-11-04 ENCOUNTER — Encounter: Payer: Self-pay | Admitting: Internal Medicine

## 2014-11-04 VITALS — BP 136/80 | HR 65 | Temp 97.9°F | Resp 12 | Wt 210.0 lb

## 2014-11-04 DIAGNOSIS — E785 Hyperlipidemia, unspecified: Secondary | ICD-10-CM | POA: Diagnosis not present

## 2014-11-04 DIAGNOSIS — E11359 Type 2 diabetes mellitus with proliferative diabetic retinopathy without macular edema: Secondary | ICD-10-CM | POA: Diagnosis not present

## 2014-11-04 DIAGNOSIS — E113599 Type 2 diabetes mellitus with proliferative diabetic retinopathy without macular edema, unspecified eye: Secondary | ICD-10-CM

## 2014-11-04 MED ORDER — CETIRIZINE HCL 10 MG PO TABS: ORAL_TABLET | ORAL | Status: DC

## 2014-11-04 NOTE — Patient Instructions (Signed)
Please continue metformin at 1000 mg twice a day Continue Januvia at the 100 mg daily  Please come back for a follow-up appointment in 3-4 months.

## 2014-11-04 NOTE — Progress Notes (Signed)
Patient ID: Katelyn Peterson, female   DOB: 05/31/1945, 69 y.o.   MRN: [id number]  HPI: Katelyn Peterson NAME is a 69 y.o.-year-old female, returning for f/u for DM2, dx 2000, non-insulin-dependent, uncontrolled, with complications (DR). Last visit 4 mo ago.  Last hemoglobin A1c was: Lab Results  Component Value Date   HGBA1C 6.2 11/03/2014   HGBA1C 6.8* 07/06/2014   HGBA1C 7.2* 04/05/2014   Pt is on a regimen of: - Metformin 1000 mg po bid - Januvia 100 mg qd We stopped Amaryl 2 mg bid and Actos 30 mg daily for daily hypoglycemia events and possible SEs, respectively.  Pt checks her sugars 1-2x a day and they are all at goal: - am: 90-110 >> 91-133 >> 120s >> n/c >> 100-120 >> 93, 105 >> n/c >> 107 >> 88-121  - 2h after b'fast: 89-130 >> 96 >> 108, 109 >> 123 - before lunch:85-133 >> n/c >> 93-140 >> n/c >> 114-120 >> 68-116 >> 84-109 >> 102 - 2h after lunch:120-125 >> 81-141 >> 78-123 >> 83-122, 136, 151 >> 97-121, 138 >> 84-117, 137 >> 110, 124 - before dinner: ~120 >> 78-127 >> n/c >> 84-110 >> 72-118 >> 84-98, 130 - 2h after dinner: 138 >> n/c >> 125 - bedtime: not checking, but not feeling hypoglycemic >> 100-120 >> n/c Lowest sugar was 68 >> 88;  she has hypoglycemia awareness at 80. Highest sugar was 137 >> 130.  - no CKD, last BUN/creatinine:  Lab Results  Component Value Date   BUN 20 11/03/2014   CREATININE 0.95 11/03/2014  She is on quinapril 40.  - last set of lipids: Lab Results  Component Value Date   CHOL 221* 11/03/2014   HDL 50.20 11/03/2014   LDLCALC 154* 11/03/2014   LDLDIRECT 146.5 09/18/2012   TRIG 81.0 11/03/2014   CHOLHDL 4 11/03/2014  She developed leg cramps with pravastatin and atorvastatin. She started Crestor 5 weekly >> did not take it lately.  - last eye exam was 11/13/2013. 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 - no numbness and tingling in her feet.  She also has a history of hypertension, hyperlipidemia, obesity-has been recommended [place], but too  expensive. She has been considered for lap band surgery >> she would not want to do this. She is improving her diet. No scale at home. She also has bilateral chronic lower extremity cramps, right knee pain, anemia.  ROS: Constitutional: + weight loss, no fatigue, no subjective hyperthermia/hypothermia Eyes: no blurry vision, no xerophthalmia ENT: no sore throat, no nodules palpated in throat, no dysphagia/odynophagia, no hoarseness Cardiovascular: no CP/SOB/palpitations/leg swelling Respiratory: no cough/SOB Gastrointestinal: no N/V/D/C Musculoskeletal: no muscle/no joint aches  Skin: no rashes Neurological: no tremors/numbness/tingling/dizziness  I reviewed pt's medications, allergies, PMH, social hx, family hx, and changes were documented in the history of present illness. Otherwise, unchanged from my initial visit note.  PE: BP 136/80 mmHg  Pulse 65  Temp(Src) 97.9 F (36.6 C) (Oral)  Resp 12  SpO2 98% Body mass index is 36.03 kg/(m^2).  Wt Readings from Last 3 Encounters:  11/04/14 210 lb (95.255 kg)  08/16/14 210 lb (95.255 kg)  07/06/14 212 lb 9.6 oz (96.435 kg)   Constitutional: obese, in NAD Eyes: PERRLA, EOMI, no exophthalmos ENT: moist mucous membranes, no thyromegaly, no cervical lymphadenopathy Cardiovascular: RRR, No MRG, + periankle swelling B Respiratory: CTA B Gastrointestinal: abdomen soft, NT, ND, BS+ Musculoskeletal: no deformities, strength intact in all 4 Skin: moist, warm, no rashes  ASSESSMENT: 1. DM2,  non-insulin-dependent, controlled, with complications - DR  2. Hyperlipidemia - Developed leg cramps with pravastatin and atorvastatin - started Crestor   3. Goiter by palpation 11/04/2012: Thyroid U/S:  Right thyroid lobe: 4.3 x 1.0 x 1.8 cm   Left thyroid lobe: 4.6 x 1.2 x 1.9 cm   Isthmus: 5 mm   Focal nodules: Thyroid echotexture is homogeneous, without solid or cystic lesion.   Lymphadenopathy: None visualized.  IMPRESSION:  Normal  thyroid ultrasound. No evidence of thyromegaly or dominant solid/cystic mass.  PLAN:  1. DM2 Patient with long-standing, now well controlled diabetes, on oral antidiabetic regimen (previously with hypoglycemia, now with all CBGs at goal). - I suggested to:  Continue metformin at 1000 mg twice a day Continue Januvia at the 100 mg daily - continue checking her sugars at different times of the day, include some bedtime CBGs - Will check hemoglobin A1c at next visit. Reviewed latest HbA1c from this month >> improved further! - Return to clinic in 3 months with sugar log   2. Hyperlipidemia - Latest Lipid panel from 10/2014 reviewed with the pt: LDL worse - she is not taking Crestor 5 mg weekly >> advised to start

## 2014-11-10 ENCOUNTER — Telehealth: Payer: Self-pay | Admitting: *Deleted

## 2014-11-10 MED ORDER — ONDANSETRON HCL 4 MG PO TABS: 4.0000 mg | ORAL_TABLET | Freq: Three times a day (TID) | ORAL | Status: DC | PRN

## 2014-11-10 NOTE — Telephone Encounter (Signed)
zofran sent/called in

## 2014-11-25 LAB — HM DIABETES EYE EXAM

## 2014-12-10 ENCOUNTER — Other Ambulatory Visit: Payer: Self-pay | Admitting: Internal Medicine

## 2014-12-10 MED ORDER — METFORMIN HCL 1000 MG PO TABS: 1000.0000 mg | ORAL_TABLET | Freq: Two times a day (BID) | ORAL | Status: DC

## 2014-12-10 NOTE — Addendum Note (Signed)
Addended by: Cresenciano Lick on: 12/10/2014 04:57 PM   Modules accepted: Orders

## 2014-12-10 NOTE — Telephone Encounter (Signed)
Pt called in said that she is out of her metFORMIN (GLUCOPHAGE) 1000 MG tablet [[id number]].  She wants to know if it can be call in asap?

## 2014-12-10 NOTE — Telephone Encounter (Signed)
Rf sent. Left detailed mess informing pt.  

## 2014-12-22 ENCOUNTER — Ambulatory Visit (INDEPENDENT_AMBULATORY_CARE_PROVIDER_SITE_OTHER): Payer: 59

## 2014-12-22 DIAGNOSIS — Z23 Encounter for immunization: Secondary | ICD-10-CM

## 2015-02-18 ENCOUNTER — Ambulatory Visit (INDEPENDENT_AMBULATORY_CARE_PROVIDER_SITE_OTHER): Payer: 59 | Admitting: Internal Medicine

## 2015-02-18 ENCOUNTER — Encounter: Payer: Self-pay | Admitting: Internal Medicine

## 2015-02-18 VITALS — BP 150/70 | HR 76 | Wt 211.0 lb

## 2015-02-18 DIAGNOSIS — I10 Essential (primary) hypertension: Secondary | ICD-10-CM | POA: Diagnosis not present

## 2015-02-18 DIAGNOSIS — E785 Hyperlipidemia, unspecified: Secondary | ICD-10-CM | POA: Diagnosis not present

## 2015-02-18 DIAGNOSIS — E113593 Type 2 diabetes mellitus with proliferative diabetic retinopathy without macular edema, bilateral: Secondary | ICD-10-CM

## 2015-02-18 DIAGNOSIS — E1169 Type 2 diabetes mellitus with other specified complication: Secondary | ICD-10-CM

## 2015-02-18 MED ORDER — TRAMADOL HCL 50 MG PO TABS: 50.0000 mg | ORAL_TABLET | Freq: Two times a day (BID) | ORAL | Status: DC | PRN

## 2015-02-18 MED ORDER — ROSUVASTATIN CALCIUM 5 MG PO TABS: 5.0000 mg | ORAL_TABLET | Freq: Every day | ORAL | Status: DC

## 2015-02-18 MED ORDER — VERAPAMIL HCL ER 240 MG PO CP24: 240.0000 mg | ORAL_CAPSULE | Freq: Every day | ORAL | Status: DC

## 2015-02-18 MED ORDER — QUINAPRIL HCL 40 MG PO TABS: 40.0000 mg | ORAL_TABLET | Freq: Every day | ORAL | Status: DC

## 2015-02-18 NOTE — Progress Notes (Signed)
Subjective:  Patient ID: Katelyn Peterson, female    DOB: 08/12/1945  Age: 69 y.o. MRN: [id number]  CC: No chief complaint on file.   HPI Katelyn Peterson presents for HTN, DM, dyslipidemia  Outpatient Prescriptions Prior to Visit  Medication Sig Dispense Refill  . cetirizine (ZYRTEC) 10 MG tablet TAKE ONE TABLET BY MOUTH ONCE DAILY 70 tablet 5  . Cholecalciferol 1000 UNITS tablet Take 1,000 Units by mouth daily.      [name] ibuprofen (ADVIL,MOTRIN) 600 MG tablet TAKE 1 TABLET BY MOUTH TWICE DAILY AFTER MEALS FOR 1 WEEK, THEN AS NEEDED FOR PAIN 60 tablet 2  . loratadine (CLARITIN) 10 MG tablet Take 1 tablet (10 mg total) by mouth daily. 90 tablet 0  . Menthol, Topical Analgesic, (BIOFREEZE) 4 % GEL Apply to affected area daily as needed 150 mL 0  . metFORMIN (GLUCOPHAGE) 1000 MG tablet Take 1 tablet (1,000 mg total) by mouth 2 (two) times daily with a meal. 180 tablet 2  . sitaGLIPtin (JANUVIA) 100 MG tablet Take 1 tablet (100 mg total) by mouth daily. 60 tablet 1  . spironolactone (ALDACTONE) 50 MG tablet TAKE 1 TABLET BY MOUTH ONCE DAILY 90 tablet 3  . ondansetron (ZOFRAN) 4 MG tablet Take 1 tablet (4 mg total) by mouth 3 (three) times daily as needed for nausea or vomiting. 15 tablet 0  . quinapril (ACCUPRIL) 40 MG tablet TAKE 1 TABLET BY MOUTH ONCE DAILY 90 tablet 3  . rosuvastatin (CRESTOR) 5 MG tablet Take 1 tablet (5 mg total) by mouth daily. 30 tablet 2  . traMADol (ULTRAM) 50 MG tablet Take 1-2 tablets (50-100 mg total) by mouth 2 (two) times daily as needed for pain. 100 tablet 2  . verapamil (VERELAN PM) 240 MG 24 hr capsule Take 1 capsule (240 mg total) by mouth at bedtime. 90 capsule 3   No facility-administered medications prior to visit.    ROS Review of Systems  Constitutional: Negative for chills, activity change, appetite change, fatigue and unexpected weight change.  HENT: Negative for congestion, mouth sores and sinus pressure.   Eyes: Negative for visual  disturbance.  Respiratory: Negative for cough and chest tightness.   Gastrointestinal: Negative for nausea and abdominal pain.  Genitourinary: Negative for frequency, difficulty urinating and vaginal pain.  Musculoskeletal: Negative for back pain and gait problem.  Skin: Negative for pallor and rash.  Neurological: Negative for dizziness, tremors, weakness, numbness and headaches.  Psychiatric/Behavioral: Negative for confusion and sleep disturbance.    Objective:  BP 150/70 mmHg  Pulse 76  Wt 211 lb (95.709 kg)  SpO2 98%  BP Readings from Last 3 Encounters:  02/18/15 150/70  11/04/14 136/80  08/16/14 140/70    Wt Readings from Last 3 Encounters:  02/18/15 211 lb (95.709 kg)  11/04/14 210 lb (95.255 kg)  08/16/14 210 lb (95.255 kg)    Physical Exam  Constitutional: She appears well-developed. No distress.  HENT:  Head: Normocephalic.  Right Ear: External ear normal.  Left Ear: External ear normal.  Nose: Nose normal.  Mouth/Throat: Oropharynx is clear and moist.  Eyes: Conjunctivae are normal. Pupils are equal, round, and reactive to light. Right eye exhibits no discharge. Left eye exhibits no discharge.  Neck: Normal range of motion. Neck supple. No JVD present. No tracheal deviation present. No thyromegaly present.  Cardiovascular: Normal rate, regular rhythm and normal heart sounds.   Pulmonary/Chest: No stridor. No respiratory distress. She has no wheezes.  Abdominal: Soft. Bowel sounds  are normal. She exhibits no distension and no mass. There is no tenderness. There is no rebound and no guarding.  Musculoskeletal: She exhibits no edema or tenderness.  Lymphadenopathy:    She has no cervical adenopathy.  Neurological: She displays normal reflexes. No cranial nerve deficit. She exhibits normal muscle tone. Coordination normal.  Skin: No rash noted. No erythema.  Psychiatric: She has a normal mood and affect. Her behavior is normal. Judgment and thought content normal.   Obese  Lab Results  Component Value Date   WBC 5.9 11/03/2014   HGB 11.5* 11/03/2014   HCT 34.7* 11/03/2014   PLT 268.0 11/03/2014   GLUCOSE 126* 11/03/2014   CHOL 221* 11/03/2014   TRIG 81.0 11/03/2014   HDL 50.20 11/03/2014   LDLDIRECT 146.5 09/18/2012   LDLCALC 154* 11/03/2014   ALT 12 11/03/2014   AST 11 11/03/2014   NA 139 11/03/2014   K 4.3 11/03/2014   CL 107 11/03/2014   CREATININE 0.95 11/03/2014   BUN 20 11/03/2014   CO2 23 11/03/2014   TSH 2.15 11/03/2014   HGBA1C 6.2 11/03/2014    US Soft Tissue Head/neck  11/04/2012  *RADIOLOGY REPORT* Clinical Data: Thyroid goiter. THYROID ULTRASOUND Technique: Ultrasound examination of the thyroid gland and adjacent soft tissues was performed. Comparison:  None. Findings: Right thyroid lobe:  4.3 x 1.0 x 1.8 cm Left thyroid lobe:  4.6 x 1.2 x 1.9 cm Isthmus:  5 mm Focal nodules:  Thyroid echotexture is homogeneous, without solid or cystic lesion. Lymphadenopathy:  None visualized. IMPRESSION: Normal thyroid ultrasound.  No evidence of thyromegaly or dominant solid/cystic mass. Original Report Authenticated By: Abigail Miyamoto, M.D.    Assessment & Plan:   There are no diagnoses linked to this encounter. I have discontinued Ms. Tsuda's ondansetron. I have also changed her quinapril and traMADol. Additionally, I am having her maintain her Cholecalciferol, loratadine, Menthol (Topical Analgesic), spironolactone, ibuprofen, sitaGLIPtin, cetirizine, metFORMIN, verapamil, and rosuvastatin.  Meds ordered this encounter  Medications  . quinapril (ACCUPRIL) 40 MG tablet    Sig: Take 1 tablet (40 mg total) by mouth daily.    Dispense:  90 tablet    Refill:  3  . verapamil (VERELAN PM) 240 MG 24 hr capsule    Sig: Take 1 capsule (240 mg total) by mouth at bedtime.    Dispense:  90 capsule    Refill:  3  . rosuvastatin (CRESTOR) 5 MG tablet    Sig: Take 1 tablet (5 mg total) by mouth daily.    Dispense:  90 tablet    Refill:  3  .  traMADol (ULTRAM) 50 MG tablet    Sig: Take 1-2 tablets (50-100 mg total) by mouth 2 (two) times daily as needed.    Dispense:  100 tablet    Refill:  1     Follow-up: Return in about 4 months (around 06/19/2015).  Walker Kehr, MD

## 2015-02-18 NOTE — Progress Notes (Signed)
Pre visit review using our clinic review tool, if applicable. No additional management support is needed unless otherwise documented below in the visit note. 

## 2015-02-19 ENCOUNTER — Encounter: Payer: Self-pay | Admitting: Internal Medicine

## 2015-02-19 NOTE — Assessment & Plan Note (Signed)
Spironolactone, quinapril, Verapamil 

## 2015-02-19 NOTE — Assessment & Plan Note (Signed)
On Crestor 

## 2015-02-19 NOTE — Assessment & Plan Note (Signed)
Metformin, Januvia, Crestor

## 2015-03-04 ENCOUNTER — Ambulatory Visit (INDEPENDENT_AMBULATORY_CARE_PROVIDER_SITE_OTHER): Payer: 59 | Admitting: Internal Medicine

## 2015-03-04 ENCOUNTER — Other Ambulatory Visit (INDEPENDENT_AMBULATORY_CARE_PROVIDER_SITE_OTHER): Payer: 59 | Admitting: *Deleted

## 2015-03-04 ENCOUNTER — Encounter: Payer: Self-pay | Admitting: Internal Medicine

## 2015-03-04 VITALS — BP 118/64 | HR 65 | Temp 97.6°F | Resp 12 | Wt 209.0 lb

## 2015-03-04 DIAGNOSIS — E113599 Type 2 diabetes mellitus with proliferative diabetic retinopathy without macular edema, unspecified eye: Secondary | ICD-10-CM

## 2015-03-04 DIAGNOSIS — Z794 Long term (current) use of insulin: Secondary | ICD-10-CM

## 2015-03-04 DIAGNOSIS — E113592 Type 2 diabetes mellitus with proliferative diabetic retinopathy without macular edema, left eye: Secondary | ICD-10-CM

## 2015-03-04 DIAGNOSIS — E785 Hyperlipidemia, unspecified: Secondary | ICD-10-CM | POA: Diagnosis not present

## 2015-03-04 LAB — POCT GLYCOSYLATED HEMOGLOBIN (HGB A1C): HEMOGLOBIN A1C: 6

## 2015-03-04 NOTE — Progress Notes (Signed)
Patient ID: Katelyn Peterson, female   DOB: March 24, 1945, 69 y.o.   MRN: [id number]  HPI: Katelyn Peterson is a 69 y.o.-year-old female, returning for f/u for DM2, dx 2000, non-insulin-dependent, uncontrolled, with complications (DR). Last visit 4 mo ago.  Last hemoglobin A1c was: Lab Results  Component Value Date   HGBA1C 6.2 11/03/2014   HGBA1C 6.8* 07/06/2014   HGBA1C 7.2* 04/05/2014   Pt is on a regimen of: - Metformin 1000 mg po bid - Januvia 100 mg qd We stopped Amaryl 2 mg bid and Actos 30 mg daily for daily hypoglycemia events and possible SEs, respectively.  Pt checks her sugars 1-2x a day and they are all at goal: - am: 90-110 >> 91-133 >> 120s >> n/c >> 100-120 >> 93, 105 >> n/c >> 107 >> 88-121 >> 80s-120s - 2h after b'fast: 89-130 >> 96 >> 108, 109 >> 123 >> n/c - before lunch:85-133 >> n/c >> 93-140 >> n/c >> 114-120 >> 68-116 >> 84-109 >> 102 >>  - 2h after lunch:120-125 >> 81-141 >> 78-123 >> 83-122, 136, 151 >> 97-121, 138 >> 84-117, 137 >> 110, 124 >> 99-100 - before dinner: ~120 >> 78-127 >> n/c >> 84-110 >> 72-118 >> 84-98, 130 >> n/c - 2h after dinner: 138 >> n/c >> 125 >> n/c - bedtime: not checking, but not feeling hypoglycemic >> 100-120 >> n/c >> 120s  Lowest sugar was 68 >> 88;  she has hypoglycemia awareness at 80. Highest sugar was 137 >> 130 >> 120s.  - no CKD, last BUN/creatinine:  Lab Results  Component Value Date   BUN 20 11/03/2014   CREATININE 0.95 11/03/2014  She is on quinapril 40.  - last set of lipids: Lab Results  Component Value Date   CHOL 221* 11/03/2014   HDL 50.20 11/03/2014   LDLCALC 154* 11/03/2014   LDLDIRECT 146.5 09/18/2012   TRIG 81.0 11/03/2014   CHOLHDL 4 11/03/2014  She developed leg cramps with pravastatin and atorvastatin. She started Crestor 5 weekly >> not always.   - last eye exam was 11/2014. 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 - no numbness and tingling in her feet.  She also has a history of hypertension, hyperlipidemia,  obesity. She has been considered for lap band surgery >> she would not want to do this. She also has bilateral chronic lower extremity cramps, right knee pain, anemia.  ROS: Constitutional: no weight loss/gain, no fatigue, no subjective hyperthermia/hypothermia Eyes: no blurry vision, no xerophthalmia ENT: no sore throat, no nodules palpated in throat, no dysphagia/odynophagia, no hoarseness Cardiovascular: no CP/SOB/palpitations/leg swelling Respiratory: no cough/SOB Gastrointestinal: no N/V/D/C Musculoskeletal: no muscle/no joint aches  Skin: no rashes Neurological: no tremors/numbness/tingling/dizziness  I reviewed pt's medications, allergies, PMH, social hx, family hx, and changes were documented in the history of present illness. Otherwise, unchanged from my initial visit note.  PE: BP 118/64 mmHg  Pulse 65  Temp(Src) 97.6 F (36.4 C) (Oral)  Resp 12  Wt 209 lb (94.802 kg)  SpO2 98% Body mass index is 35.86 kg/(m^2).  Wt Readings from Last 3 Encounters:  03/04/15 209 lb (94.802 kg)  02/18/15 211 lb (95.709 kg)  11/04/14 210 lb (95.255 kg)   Constitutional: obese, in NAD Eyes: PERRLA, EOMI, no exophthalmos ENT: moist mucous membranes, no thyromegaly, no cervical lymphadenopathy Cardiovascular: RRR, No MRG, + periankle swelling B (pitting) Respiratory: CTA B Gastrointestinal: abdomen soft, NT, ND, BS+ Musculoskeletal: no deformities, strength intact in all 4 Skin: moist, warm, no rashes  ASSESSMENT: 1. 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 Hyperlipidemia - Developed leg cramps with pravastatin and atorvastatin - started Crestor - not taking it every week  3. Goiter by palpation 11/04/2012: Thyroid U/S:  Right thyroid lobe: 4.3 x 1.0 x 1.8 cm   Left thyroid lobe: 4.6 x 1.2 x 1.9 cm   Isthmus: 5 mm   Focal nodules: Thyroid echotexture is homogeneous, without solid or cystic lesion.   Lymphadenopathy: None visualized.   IMPRESSION:  Normal thyroid ultrasound. No evidence of thyromegaly or dominant solid/cystic mass.  PLAN:  1. DM2 Patient with long-standing, now well controlled diabetes, on oral antidiabetic regimen, with all CBGs at goal. No low CBGs. Will continue current regimen.  - I suggested to:  Patient Instructions  Please continue metformin at 1000 mg twice a day Continue Januvia at the 100 mg daily  Please come back for a follow-up appointment in 4 months.  - continue checking her sugars at different times of the day - Will check hemoglobin A1c >> 6.0% (better!) - Return to clinic in 4 months with sugar log   2. Hyperlipidemia - Latest Lipid panel from 10/2014 reviewed with the pt: LDL worse - she is not taking Crestor 5 mg weekly >> advised to take it consistently

## 2015-03-04 NOTE — Patient Instructions (Signed)
Please continue metformin at 1000 mg twice a day Continue Januvia at the 100 mg daily  Please come back for a follow-up appointment in 4 months.

## 2015-03-11 ENCOUNTER — Other Ambulatory Visit: Payer: Self-pay | Admitting: Internal Medicine

## 2015-03-11 ENCOUNTER — Telehealth: Payer: Self-pay | Admitting: Internal Medicine

## 2015-03-11 MED ORDER — SPIRONOLACTONE 50 MG PO TABS: 50.0000 mg | ORAL_TABLET | Freq: Every day | ORAL | Status: DC

## 2015-03-11 NOTE — Telephone Encounter (Signed)
Done. See meds. Pt informed  

## 2015-03-11 NOTE — Telephone Encounter (Signed)
Needs spironolactone sent to [place] [facility] [facility]

## 2015-03-16 ENCOUNTER — Ambulatory Visit: Payer: 59 | Admitting: Internal Medicine

## 2015-03-28 ENCOUNTER — Ambulatory Visit: Payer: 59 | Admitting: Family Medicine

## 2015-04-01 ENCOUNTER — Encounter: Payer: Self-pay | Admitting: Family Medicine

## 2015-04-01 ENCOUNTER — Other Ambulatory Visit (INDEPENDENT_AMBULATORY_CARE_PROVIDER_SITE_OTHER): Payer: 59

## 2015-04-01 ENCOUNTER — Other Ambulatory Visit: Payer: Self-pay | Admitting: Internal Medicine

## 2015-04-01 ENCOUNTER — Ambulatory Visit (INDEPENDENT_AMBULATORY_CARE_PROVIDER_SITE_OTHER)
Admission: RE | Admit: 2015-04-01 | Discharge: 2015-04-01 | Disposition: A | Discharge status: Home / Self Care | Payer: 59 | Source: Ambulatory Visit | Attending: Family Medicine | Admitting: Family Medicine

## 2015-04-01 ENCOUNTER — Ambulatory Visit (INDEPENDENT_AMBULATORY_CARE_PROVIDER_SITE_OTHER): Payer: 59 | Admitting: Family Medicine

## 2015-04-01 VITALS — BP 142/82 | HR 78 | Ht 64.0 in | Wt 210.0 lb

## 2015-04-01 DIAGNOSIS — M129 Arthropathy, unspecified: Secondary | ICD-10-CM | POA: Diagnosis not present

## 2015-04-01 DIAGNOSIS — M79672 Pain in left foot: Secondary | ICD-10-CM

## 2015-04-01 DIAGNOSIS — M25572 Pain in left ankle and joints of left foot: Secondary | ICD-10-CM | POA: Diagnosis not present

## 2015-04-01 DIAGNOSIS — E669 Obesity, unspecified: Secondary | ICD-10-CM

## 2015-04-01 DIAGNOSIS — M19079 Primary osteoarthritis, unspecified ankle and foot: Secondary | ICD-10-CM | POA: Insufficient documentation

## 2015-04-01 MED ORDER — DICLOFENAC SODIUM 2 % TD SOLN: TRANSDERMAL | Status: DC

## 2015-04-01 NOTE — Progress Notes (Signed)
Pre visit review using our clinic review tool, if applicable. No additional management support is needed unless otherwise documented below in the visit note. 

## 2015-04-01 NOTE — Progress Notes (Signed)
[facility] [address] [place], [place] [address] Phone: [phone] Subjective:    I'm seeing this patient by the request  of:  Walker Kehr, MD   CC: Left foot pain  [id number] Katelyn Peterson is a 70 y.o. female coming in with complaint of left foot pain. Patient states that she has had this pain for quite a while now. Does not remember any true injury. Seems to be on the anterior aspect of the foot and ankle. Only hurts when she is weightbearing. Intermittent swelling. No numbness. No weakness. Not stopping her from any activity. Patient states that it may be getting worse over that time. Severity is 7 out of 10. No pain at rest.     Past Medical History  Diagnosis Date  . Hyperlipidemia   . HTN (hypertension)   . Type II or unspecified type diabetes mellitus without mention of complication, not stated as uncontrolled   . Obesity    Past Surgical History  Procedure Laterality Date  . Abdominal hysterectomy     Social History   Social History  . Marital Status: Married    Spouse Name: N/A  . Number of Children: N/A  . Years of Education: N/A   Occupational History  . [employer]    Social History Main Topics  . Smoking status: Never Smoker   . Smokeless tobacco: None  . Alcohol Use: No  . Drug Use: No  . Sexual Activity:    Partners: Male   Other Topics Concern  . None   Social History Narrative   Regular exercise: seldom   Caffeine use: occasionally         Allergies  Allergen Reactions  . Atorvastatin     REACTION: cramps  . Hydrochlorothiazide     REACTION: Cramps  . Pravastatin     cramps   Family History  Problem Relation Age of Onset  . Hypertension Other   . Hypertension Mother   . Diabetes Mother   . Hypertension Father     Past medical history, social, surgical and family history all reviewed in electronic medical record.  No pertanent information unless stated regarding to the chief  complaint.   Review of Systems: No headache, visual changes, nausea, vomiting, diarrhea, constipation, dizziness, abdominal pain, skin rash, fevers, chills, night sweats, weight loss, swollen lymph nodes, body aches, joint swelling, muscle aches, chest pain, shortness of breath, mood changes.   Objective Blood pressure 142/82, pulse 78, height 5\' 4"  (1.626 m), weight 210 lb (95.255 kg), SpO2 97 %.  General: No apparent distress alert and oriented x3 mood and affect normal, dressed appropriately. Overweight HEENT: Pupils equal, extraocular movements intact  Respiratory: Patient's speak in full sentences and does not appear short of breath  Cardiovascular: No lower extremity edema, non tender, no erythema  Skin: Warm dry intact with no signs of infection or rash on extremities or on axial skeleton.  Abdomen: Soft nontender  Neuro: Cranial nerves II through XII are intact, neurovascularly intact in all extremities with 2+ DTRs and 2+ pulses.  Lymph: No lymphadenopathy of posterior or anterior cervical chain or axillae bilaterally.  Gait normal with good balance and coordination.  MSK:  Non tender with full range of motion and good stability and symmetric strength and tone of shoulders, elbows, wrist, hip, knees bilaterally. Moderate arthritic changes of multiple joints  Ankle: Left Race effusion noted Mild limitation in extension or dorsiflexion of the ankle as well as with  eversion and inversion.. Strength is 5/5 in all directions. Stable lateral and medial ligaments; squeeze test and kleiger test unremarkable; Talar dome mild tenderness No pain at base of 5th MT; No tenderness over cuboid; No tenderness over N spot or navicular prominence No tenderness on posterior aspects of lateral and medial malleolus No sign of peroneal tendon subluxations or tenderness to palpation Negative tarsal tunnel tinel's Able to walk 4 steps. Contralateral ankle has some mild range of motion loss as well but  nontender.  MSK US performed of: *Left ankle This study was ordered, performed, and interpreted by Charlann Boxer D.O.  Foot/Ankle:   All structures visualized.   Talar dome has anterior spurring. Large anterior spur noted right near the anterior tibialis tendon. Hypoechoic changes and increasing Doppler flow Ankle mortise with trace effusion Peroneus longus and brevis tendons unremarkable on long and transverse views without sheath effusions. Posterior tibialis, flexor hallucis longus, and flexor digitorum longus tendons unremarkable on long and transverse views without sheath effusions. Achilles tendon visualized along length of tendon and unremarkable on long and transverse views without sheath effusion. Anterior Talofibular Ligament and Calcaneofibular Ligaments unremarkable and intact. Deltoid Ligament unremarkable and intact. Plantar fascia intact and without effusion, normal thickness. No increased doppler signal, cap sign, or thickening of tibial cortex. Power doppler signal normal.  IMPRESSION: Moderate ankle arthritis with small effusion and spurring of the anterior aspect causing anterior tibialis tendinitis     Impression and Recommendations:     This case required medical decision making of moderate complexity.      Note: This dictation was prepared with Dragon dictation along with smaller phrase technology. Any transcriptional errors that result from this process are unintentional.

## 2015-04-01 NOTE — Assessment & Plan Note (Signed)
Patient does have what appears to be moderate arthritic changes of the ankle joint. I believe the patient does have an anterior osteophyte that seems to be giving her the discomfort. We discussed different treatment options. Patient discussed and decided tests she would like to wear the Cam Walker. X-rays ordered today to rule out any type of talar dome problem. Discussed icing regimen, patient given prescription for topical anti-inflammatory. We discussed which activities down which was potentially avoid. Patient will avoid being barefoot. Patient and will come back and see me again in 2-3 weeks. We need to discuss proper shoe choices in the long run as well.

## 2015-04-01 NOTE — Patient Instructions (Signed)
Good to see you Ice 20 minutes 2 times daily. Usually after activity and before bed. pennsaid pinkie amount topically 2 times daily as needed.  Call pharmacy and they will send it to your house.  Vitamin D 4000 IU daily  Get xray downstairs today  Wear boot daily for next week.  Avoid being barefoot even in the house.  See me again in 2-3 weeks and we will ultrasound it again and see if the swelling is gone or if we need to try the injection Have a great weekend.

## 2015-04-01 NOTE — Assessment & Plan Note (Signed)
Encourage weight loss. 

## 2015-04-18 MED FILL — JANUVIA 100 MG TABLET: 100 | 60 days supply | Qty: 60 | Fill #0

## 2015-04-22 ENCOUNTER — Ambulatory Visit (INDEPENDENT_AMBULATORY_CARE_PROVIDER_SITE_OTHER): Payer: 59 | Admitting: Family Medicine

## 2015-04-22 ENCOUNTER — Encounter: Payer: Self-pay | Admitting: Family Medicine

## 2015-04-22 VITALS — BP 132/76 | HR 64 | Ht 64.0 in | Wt 210.0 lb

## 2015-04-22 DIAGNOSIS — M19079 Primary osteoarthritis, unspecified ankle and foot: Secondary | ICD-10-CM

## 2015-04-22 DIAGNOSIS — M129 Arthropathy, unspecified: Secondary | ICD-10-CM | POA: Diagnosis not present

## 2015-04-22 NOTE — Progress Notes (Signed)
Pre visit review using our clinic review tool, if applicable. No additional management support is needed unless otherwise documented below in the visit note. 

## 2015-04-22 NOTE — Assessment & Plan Note (Signed)
Patient's osteoarthritic flare seems to be done at this time. We discussed what to do when patient does have a flare. When to return to me would be more on an as-needed basis as long as patient does well.

## 2015-04-22 NOTE — Patient Instructions (Signed)
Verbal instructions given

## 2015-04-22 NOTE — Progress Notes (Signed)
[facility] [address] [place], [place] [address] Phone: [phone] Subjective:    I'm seeing this patient by the request  of:  Walker Kehr, MD   CC: Left foot pain follow-up  [id number] Katelyn Peterson is a 70 y.o. female coming in with complaint of left foot pain. Patient was seen 3 weeks ago and was diagnosed with moderate arthritis of the ankle as well as foot. Patient will elected to try a Cam Walker. Patient was to do some over-the-counter medications. Patient statesshe is 95% better. Still walks with a limp but she states she is no longer having pain. States that she has been out of the boot for over a week. He is using the over-the-counter medicines regularly. Swelling has dissipated within the first week of treatment and then has not returned.     Past Medical History  Diagnosis Date  . Hyperlipidemia   . HTN (hypertension)   . Type II or unspecified type diabetes mellitus without mention of complication, not stated as uncontrolled   . Obesity    Past Surgical History  Procedure Laterality Date  . Abdominal hysterectomy     Social History   Social History  . Marital Status: Married    Spouse Name: N/A  . Number of Children: N/A  . Years of Education: N/A   Occupational History  . [employer]    Social History Main Topics  . Smoking status: Never Smoker   . Smokeless tobacco: Not on file  . Alcohol Use: No  . Drug Use: No  . Sexual Activity:    Partners: Male   Other Topics Concern  . Not on file   Social History Narrative   Regular exercise: seldom   Caffeine use: occasionally         Allergies  Allergen Reactions  . Atorvastatin     REACTION: cramps  . Hydrochlorothiazide     REACTION: Cramps  . Pravastatin     cramps   Family History  Problem Relation Age of Onset  . Hypertension Other   . Hypertension Mother   . Diabetes Mother   . Hypertension Father     Past medical history, social,  surgical and family history all reviewed in electronic medical record.  No pertanent information unless stated regarding to the chief complaint.   Review of Systems: No headache, visual changes, nausea, vomiting, diarrhea, constipation, dizziness, abdominal pain, skin rash, fevers, chills, night sweats, weight loss, swollen lymph nodes, body aches, joint swelling, muscle aches, chest pain, shortness of breath, mood changes.   Objective There were no vitals taken for this visit.  General: No apparent distress alert and oriented x3 mood and affect normal, dressed appropriately. Overweight HEENT: Pupils equal, extraocular movements intact  Respiratory: Patient's speak in full sentences and does not appear short of breath  Cardiovascular: No lower extremity edema, non tender, no erythema  Skin: Warm dry intact with no signs of infection or rash on extremities or on axial skeleton.  Abdomen: Soft nontender  Neuro: Cranial nerves II through XII are intact, neurovascularly intact in all extremities with 2+ DTRs and 2+ pulses.  Lymph: No lymphadenopathy of posterior or anterior cervical chain or axillae bilaterally.  Gait normal with good balance and coordination.  MSK:  Non tender with full range of motion and good stability and symmetric strength and tone of shoulders, elbows, wrist, hip, knees bilaterally. Moderate arthritic changes of multiple joints  Ankle: Left No effusion noted Mild  limitation in extension or dorsiflexion of the ankle as well as with eversion and inversion but now only lacking the last 5 in all planes. Strength is 5/5 in all directions. Stable lateral and medial ligaments; squeeze test and kleiger test unremarkable; Talar dome nontender No pain at base of 5th MT; No tenderness over cuboid; No tenderness over N spot or navicular prominence No tenderness on posterior aspects of lateral and medial malleolus No sign of peroneal tendon subluxations or tenderness to  palpation Negative tarsal tunnel tinel's Able to walk 4 steps. Contralateral ankle has some mild range of motion loss as well but nontender.

## 2015-05-06 MED FILL — metFORMIN HCL 1000 MG TABS: 1000 | 90 days supply | Qty: 180 | Fill #1

## 2015-05-24 DIAGNOSIS — H25013 Cortical age-related cataract, bilateral: Secondary | ICD-10-CM | POA: Diagnosis not present

## 2015-05-24 DIAGNOSIS — H2513 Age-related nuclear cataract, bilateral: Secondary | ICD-10-CM | POA: Diagnosis not present

## 2015-05-24 DIAGNOSIS — H5203 Hypermetropia, bilateral: Secondary | ICD-10-CM | POA: Diagnosis not present

## 2015-05-24 DIAGNOSIS — E113293 Type 2 diabetes mellitus with mild nonproliferative diabetic retinopathy without macular edema, bilateral: Secondary | ICD-10-CM | POA: Diagnosis not present

## 2015-05-24 LAB — HM DIABETES EYE EXAM

## 2015-06-01 ENCOUNTER — Encounter: Payer: Self-pay | Admitting: Internal Medicine

## 2015-06-22 ENCOUNTER — Ambulatory Visit (INDEPENDENT_AMBULATORY_CARE_PROVIDER_SITE_OTHER): Payer: 59 | Admitting: Internal Medicine

## 2015-06-22 ENCOUNTER — Encounter: Payer: Self-pay | Admitting: Internal Medicine

## 2015-06-22 VITALS — BP 130/80 | HR 74 | Wt 211.0 lb

## 2015-06-22 DIAGNOSIS — I10 Essential (primary) hypertension: Secondary | ICD-10-CM | POA: Diagnosis not present

## 2015-06-22 DIAGNOSIS — E119 Type 2 diabetes mellitus without complications: Secondary | ICD-10-CM | POA: Diagnosis not present

## 2015-06-22 DIAGNOSIS — E113599 Type 2 diabetes mellitus with proliferative diabetic retinopathy without macular edema, unspecified eye: Secondary | ICD-10-CM | POA: Diagnosis not present

## 2015-06-22 DIAGNOSIS — J3089 Other allergic rhinitis: Secondary | ICD-10-CM

## 2015-06-22 DIAGNOSIS — E1169 Type 2 diabetes mellitus with other specified complication: Secondary | ICD-10-CM

## 2015-06-22 DIAGNOSIS — Z0000 Encounter for general adult medical examination without abnormal findings: Secondary | ICD-10-CM | POA: Diagnosis not present

## 2015-06-22 DIAGNOSIS — B351 Tinea unguium: Secondary | ICD-10-CM | POA: Insufficient documentation

## 2015-06-22 DIAGNOSIS — M25561 Pain in right knee: Secondary | ICD-10-CM

## 2015-06-22 DIAGNOSIS — E785 Hyperlipidemia, unspecified: Secondary | ICD-10-CM

## 2015-06-22 MED ORDER — CICLOPIROX 8 % EX SOLN: Freq: Every day | CUTANEOUS | Status: DC

## 2015-06-22 MED ORDER — SITAGLIPTIN PHOSPHATE 100 MG PO TABS: 100.0000 mg | ORAL_TABLET | Freq: Every day | ORAL | Status: DC

## 2015-06-22 MED ORDER — TRAMADOL HCL 50 MG PO TABS: 50.0000 mg | ORAL_TABLET | Freq: Two times a day (BID) | ORAL | Status: DC | PRN

## 2015-06-22 MED FILL — VERAPAMIL 240 MG CAP PELLET: 240 | 90 days supply | Qty: 90 | Fill #1

## 2015-06-22 MED FILL — SPIRONOLACTONE 50 MG TABLET: 50 | 90 days supply | Qty: 90 | Fill #1

## 2015-06-22 MED FILL — traMADol HCL 50 MG TABS: 50 | 25 days supply | Qty: 100 | Fill #0

## 2015-06-22 MED FILL — QUINAPRIL 40 MG TABLET: 40 | 90 days supply | Qty: 90 | Fill #1

## 2015-06-22 MED FILL — JANUVIA 100 MG TABLET: 100 | 90 days supply | Qty: 90 | Fill #0

## 2015-06-22 NOTE — Assessment & Plan Note (Signed)
Chronic  Claritin prn

## 2015-06-22 NOTE — Progress Notes (Signed)
Subjective:  Patient ID: Katelyn Peterson, female    DOB: [birth year]  Age: 70 y.o. MRN: [id number]  CC: No chief complaint on file.   HPI Katelyn Peterson presents for DM, HTN, allergies f/up  Outpatient Prescriptions Prior to Visit  Medication Sig Dispense Refill  . cetirizine (ZYRTEC) 10 MG tablet TAKE ONE TABLET BY MOUTH ONCE DAILY 70 tablet 5  . Cholecalciferol 1000 UNITS tablet Take 1,000 Units by mouth daily.      . Diclofenac Sodium 2 % SOLN Apply 1 pump twice daily. 112 g 3  . ibuprofen (ADVIL,MOTRIN) 600 MG tablet TAKE 1 TABLET BY MOUTH TWICE DAILY AFTER MEALS FOR 1 WEEK, THEN AS NEEDED FOR PAIN 60 tablet 2  . JANUVIA 100 MG tablet TAKE 1 TABLET BY MOUTH ONCE DAILY 60 tablet 1  . loratadine (CLARITIN) 10 MG tablet Take 1 tablet (10 mg total) by mouth daily. 90 tablet 0  . Menthol, Topical Analgesic, (BIOFREEZE) 4 % GEL Apply to affected area daily as needed 150 mL 0  . metFORMIN (GLUCOPHAGE) 1000 MG tablet Take 1 tablet (1,000 mg total) by mouth 2 (two) times daily with a meal. 180 tablet 2  . quinapril (ACCUPRIL) 40 MG tablet Take 1 tablet (40 mg total) by mouth daily. 90 tablet 3  . rosuvastatin (CRESTOR) 5 MG tablet Take 1 tablet (5 mg total) by mouth daily. 90 tablet 3  . spironolactone (ALDACTONE) 50 MG tablet Take 1 tablet (50 mg total) by mouth daily. 90 tablet 3  . traMADol (ULTRAM) 50 MG tablet Take 1-2 tablets (50-100 mg total) by mouth 2 (two) times daily as needed. 100 tablet 1  . verapamil (VERELAN PM) 240 MG 24 hr capsule Take 1 capsule (240 mg total) by mouth at bedtime. 90 capsule 3   No facility-administered medications prior to visit.    ROS Review of Systems  Constitutional: Negative for chills, activity change, appetite change, fatigue and unexpected weight change.  HENT: Negative for congestion, mouth sores and sinus pressure.   Eyes: Negative for visual disturbance.  Respiratory: Negative for cough and chest tightness.   Gastrointestinal: Negative  for nausea and abdominal pain.  Genitourinary: Negative for frequency, difficulty urinating and vaginal pain.  Musculoskeletal: Positive for back pain and arthralgias. Negative for gait problem.  Skin: Negative for pallor and rash.  Neurological: Negative for dizziness, tremors, weakness, numbness and headaches.  Psychiatric/Behavioral: Negative for suicidal ideas, confusion and sleep disturbance.    Objective:  BP 130/80 mmHg  Pulse 74  Wt 211 lb (95.709 kg)  SpO2 96%  BP Readings from Last 3 Encounters:  06/22/15 130/80  04/22/15 132/76  04/01/15 142/82    Wt Readings from Last 3 Encounters:  06/22/15 211 lb (95.709 kg)  04/22/15 210 lb (95.255 kg)  04/01/15 210 lb (95.255 kg)    Physical Exam  Constitutional: She appears well-developed. No distress.  HENT:  Head: Normocephalic.  Right Ear: External ear normal.  Left Ear: External ear normal.  Nose: Nose normal.  Mouth/Throat: Oropharynx is clear and moist.  Eyes: Conjunctivae are normal. Pupils are equal, round, and reactive to light. Right eye exhibits no discharge. Left eye exhibits no discharge.  Neck: Normal range of motion. Neck supple. No JVD present. No tracheal deviation present. No thyromegaly present.  Cardiovascular: Normal rate, regular rhythm and normal heart sounds.   Pulmonary/Chest: No stridor. No respiratory distress. She has no wheezes.  Abdominal: Soft. Bowel sounds are normal. She exhibits no distension and no  mass. There is no tenderness. There is no rebound and no guarding.  Musculoskeletal: She exhibits tenderness. She exhibits no edema.  Lymphadenopathy:    She has no cervical adenopathy.  Neurological: She displays normal reflexes. No cranial nerve deficit. She exhibits normal muscle tone. Coordination normal.  Skin: No rash noted. No erythema.  Psychiatric: She has a normal mood and affect. Her behavior is normal. Judgment and thought content normal.  Obese Onycho toenails  Lab Results    Component Value Date   WBC 5.9 11/03/2014   HGB 11.5* 11/03/2014   HCT 34.7* 11/03/2014   PLT 268.0 11/03/2014   GLUCOSE 126* 11/03/2014   CHOL 221* 11/03/2014   TRIG 81.0 11/03/2014   HDL 50.20 11/03/2014   LDLDIRECT 146.5 09/18/2012   LDLCALC 154* 11/03/2014   ALT 12 11/03/2014   AST 11 11/03/2014   NA 139 11/03/2014   K 4.3 11/03/2014   CL 107 11/03/2014   CREATININE 0.95 11/03/2014   BUN 20 11/03/2014   CO2 23 11/03/2014   TSH 2.15 11/03/2014   HGBA1C 6.0 03/04/2015    Dg Ankle Complete Left  04/01/2015  CLINICAL DATA:  Anterior left ankle pain for 6 months. No known injury. History of bone spur. EXAM: LEFT ANKLE COMPLETE - 3+ VIEW COMPARISON:  Radiographs 08/22/2011. FINDINGS: The bones are demineralized. There is no evidence of acute fracture or dislocation. Spurring of the medial malleolus and posterior aspect of the calcaneal tuberosity is unchanged. The inferior aspect of the calcaneus is not imaged. Moderate midfoot degenerative changes are noted. The soft tissue surrounding the ankle are diffusely prominent as before. There are scattered vascular calcifications. IMPRESSION: Stable left ankle radiographs. Degenerative changes as described without acute findings. Electronically Signed   By: Richardean Sale M.D.   On: 04/01/2015 13:36   [place] Extrem Low Left Ltd  04/04/2015  MSK US performed of: *Left ankle This study was ordered, performed, and interpreted by Charlann Boxer D.O. Foot/Ankle:  All structures visualized.  Talar dome has anterior spurring. Large anterior spur noted right near the anterior tibialis tendon. Hypoechoic changes and increasing Doppler flow Ankle mortise with trace effusion Peroneus longus and brevis tendons unremarkable on long and transverse views without sheath effusions. Posterior tibialis, flexor hallucis longus, and flexor digitorum longus tendons unremarkable on long and transverse views without sheath effusions. Achilles tendon visualized along  length of tendon and unremarkable on long and transverse views without sheath effusion. Anterior Talofibular Ligament and Calcaneofibular Ligaments unremarkable and intact. Deltoid Ligament unremarkable and intact. Plantar fascia intact and without effusion, normal thickness. No increased doppler signal, cap sign, or thickening of tibial cortex. Power doppler signal normal.   04/04/2015  Moderate ankle arthritis with small effusion and spurring of the anterior aspect causing anterior tibialis tendinitis    Assessment & Plan:   There are no diagnoses linked to this encounter. I am having Ms. Waguespack maintain her Cholecalciferol, loratadine, Menthol (Topical Analgesic), ibuprofen, cetirizine, metFORMIN, quinapril, verapamil, rosuvastatin, traMADol, spironolactone, JANUVIA, and Diclofenac Sodium.  No orders of the defined types were placed in this encounter.     Follow-up: No Follow-up on file.  Walker Kehr, MD

## 2015-06-22 NOTE — Progress Notes (Signed)
Pre visit review using our clinic review tool, if applicable. No additional management support is needed unless otherwise documented below in the visit note. 

## 2015-06-22 NOTE — Assessment & Plan Note (Signed)
Metformin, Januvia, Crestor

## 2015-06-22 NOTE — Assessment & Plan Note (Signed)
Toenails - [facility] 

## 2015-06-22 NOTE — Assessment & Plan Note (Signed)
  Spironolactone, quinapril, Verapamil

## 2015-06-22 NOTE — Assessment & Plan Note (Signed)
Tramadol prn ° Potential benefits of a long term opioids use as well as potential risks (i.e. addiction risk, apnea etc) and complications (i.e. Somnolence, constipation and others) were explained to the patient and were aknowledged. ° ° °

## 2015-07-04 ENCOUNTER — Ambulatory Visit: Payer: 59 | Admitting: Endocrinology

## 2015-07-04 ENCOUNTER — Encounter: Payer: Self-pay | Admitting: Internal Medicine

## 2015-07-04 ENCOUNTER — Ambulatory Visit (INDEPENDENT_AMBULATORY_CARE_PROVIDER_SITE_OTHER): Payer: 59 | Admitting: Internal Medicine

## 2015-07-04 VITALS — BP 124/80 | HR 69 | Temp 97.9°F | Resp 14 | Wt 205.0 lb

## 2015-07-04 DIAGNOSIS — E049 Nontoxic goiter, unspecified: Secondary | ICD-10-CM

## 2015-07-04 DIAGNOSIS — E1169 Type 2 diabetes mellitus with other specified complication: Secondary | ICD-10-CM

## 2015-07-04 DIAGNOSIS — E785 Hyperlipidemia, unspecified: Secondary | ICD-10-CM | POA: Diagnosis not present

## 2015-07-04 DIAGNOSIS — E119 Type 2 diabetes mellitus without complications: Secondary | ICD-10-CM

## 2015-07-04 NOTE — Patient Instructions (Signed)
Please continue metformin at 1000 mg 2x a day Continue Januvia at the 100 mg daily  Please come back for a follow-up appointment in 4 months.

## 2015-07-04 NOTE — Progress Notes (Signed)
Patient ID: Katelyn Peterson, female   DOB: October 13, 1945, 70 y.o.   MRN: [id number]  HPI: Katelyn Peterson is a 70 y.o.-year-old female, returning for f/u for DM2, dx 2000, non-insulin-dependent, uncontrolled, with complications (DR). Last visit 4 mo ago.  Last hemoglobin A1c was: Lab Results  Component Value Date   HGBA1C 6.0 03/04/2015   HGBA1C 6.2 11/03/2014   HGBA1C 6.8* 07/06/2014   Pt is on a regimen of: - Metformin 1000 mg po bid - Januvia 100 mg qd We stopped Amaryl 2 mg bid and Actos 30 mg daily for daily hypoglycemia events and possible SEs, respectively.  Pt checks her sugars 1-2x a day and they are all at goal: - am: 90-110 >> 91-133 >> 120s >> n/c >> 100-120 >> 93, 105 >> n/c >> 107 >> 88-121 >> 80s-120s >> 80-129 - 2h after b'fast: 89-130 >> 96 >> 108, 109 >> 123 >> n/c >> 131 - before lunch:85-133 >> n/c >> 93-140 >> n/c >> 114-120 >> 68-116 >> 84-109 >> 102 >> n/c - 2h after lunch:120-125 >> 81-141 >> 78-123 >> 83-122, 136, 151 >> 97-121, 138 >> 84-117, 137 >> 110, 124 >> 99-100 >> n/c - before dinner: ~120 >> 78-127 >> n/c >> 84-110 >> 72-118 >> 84-98, 130 >> n/c >> 76-101 - 2h after dinner: 138 >> n/c >> 125 >> n/c - bedtime: not checking, but not feeling hypoglycemic >> 100-120 >> n/c >> 120s >> 90 Lowest sugar was 68 >> 88;  she has hypoglycemia awareness at 80. Highest sugar was 137 >> 130 >> 120s >> 131.  - no CKD, last BUN/creatinine:  Lab Results  Component Value Date   BUN 20 11/03/2014   CREATININE 0.95 11/03/2014  She is on quinapril 40.  - last set of lipids: Lab Results  Component Value Date   CHOL 221* 11/03/2014   HDL 50.20 11/03/2014   LDLCALC 154* 11/03/2014   LDLDIRECT 146.5 09/18/2012   TRIG 81.0 11/03/2014   CHOLHDL 4 11/03/2014  She developed leg cramps with pravastatin and atorvastatin. She started Crestor once a week, not every week.  - last eye exam was 11/2014. 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 - no numbness and tingling in her feet.  She  also has a history of hypertension, hyperlipidemia, obesity. She has been considered for lap band surgery >> she would not want to do this. She also has bilateral chronic lower extremity cramps, right knee pain, anemia.  ROS: Constitutional: no weight loss/gain, no fatigue, no subjective hyperthermia/hypothermia Eyes: no blurry vision, no xerophthalmia ENT: no sore throat, no nodules palpated in throat, no dysphagia/odynophagia, no hoarseness Cardiovascular: no CP/SOB/palpitations/leg swelling Respiratory: no cough/SOB Gastrointestinal: no N/V/D/C Musculoskeletal: no muscle/no joint aches  Skin: no rashes Neurological: no tremors/numbness/tingling/dizziness  I reviewed pt's medications, allergies, PMH, social hx, family hx, and changes were documented in the history of present illness. Otherwise, unchanged from my initial visit note.  PE: BP 124/80 mmHg  Pulse 69  Temp(Src) 97.9 F (36.6 C) (Oral)  Resp 14  Wt 205 lb (92.987 kg)  SpO2 98% Body mass index is 35.17 kg/(m^2).  Wt Readings from Last 3 Encounters:  07/04/15 205 lb (92.987 kg)  06/22/15 211 lb (95.709 kg)  04/22/15 210 lb (95.255 kg)   Constitutional: obese, in NAD Eyes: PERRLA, EOMI, no exophthalmos ENT: moist mucous membranes, no thyromegaly, no cervical lymphadenopathy Cardiovascular: RRR, No MRG, + periankle swelling B (pitting) Respiratory: CTA B Gastrointestinal: abdomen soft, NT, ND, BS+ Musculoskeletal: no  deformities, strength intact in all 4 Skin: moist, warm, no rashes  ASSESSMENT: 1. 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 Hyperlipidemia - Developed leg cramps with pravastatin and atorvastatin - started Crestor - not taking it every week  3. Goiter by palpation 11/04/2012: Thyroid U/S:  Right thyroid lobe: 4.3 x 1.0 x 1.8 cm   Left thyroid lobe: 4.6 x 1.2 x 1.9 cm   Isthmus: 5 mm   Focal nodules: Thyroid echotexture is homogeneous, without solid or cystic  lesion.   Lymphadenopathy: None visualized.  IMPRESSION:  Normal thyroid ultrasound. No evidence of thyromegaly or dominant solid/cystic mass.  PLAN:  1. DM2 Patient with long-standing, now well controlled diabetes, on oral antidiabetic regimen, with all CBGs at goal. No low CBGs. Will continue current regimen.  - I suggested to:  Patient Instructions  Please continue metformin at 1000 mg twice a day Continue Januvia at the 100 mg daily  Please come back for a follow-up appointment in 4 months.  - continue checking her sugars at different times of the day - Will check hemoglobin A1c >> 6.1% (great!, stable) - Return to clinic in 4 months with sugar log   2. Hyperlipidemia - Latest Lipid panel from 10/2014 reviewed with the pt: LDL worse - she is taking Crestor 5 mg weekly >> but not every week b/c cramps  3. Goiter - stable - no neck compression sxs

## 2015-07-11 DIAGNOSIS — Z1231 Encounter for screening mammogram for malignant neoplasm of breast: Secondary | ICD-10-CM | POA: Diagnosis not present

## 2015-07-12 ENCOUNTER — Other Ambulatory Visit: Payer: Self-pay | Admitting: Adult Health

## 2015-07-12 MED ORDER — BENZONATATE 200 MG PO CAPS: 200.0000 mg | ORAL_CAPSULE | Freq: Three times a day (TID) | ORAL | Status: DC | PRN

## 2015-07-12 MED ORDER — DOXYCYCLINE HYCLATE 100 MG PO CAPS: 100.0000 mg | ORAL_CAPSULE | Freq: Two times a day (BID) | ORAL | Status: DC

## 2015-07-12 MED FILL — DOXYCYCLINE HYCLATE 100 MG: 100 | 7 days supply | Qty: 14 | Fill #0

## 2015-07-12 MED FILL — BENZONATATE 200 MG CAPSULE: 200 | 6 days supply | Qty: 20 | Fill #0

## 2015-07-14 ENCOUNTER — Other Ambulatory Visit: Payer: Self-pay | Admitting: Adult Health

## 2015-07-14 MED ORDER — BENZONATATE 200 MG PO CAPS: 200.0000 mg | ORAL_CAPSULE | Freq: Three times a day (TID) | ORAL | Status: DC | PRN

## 2015-07-14 MED ORDER — DOXYCYCLINE HYCLATE 100 MG PO CAPS: 100.0000 mg | ORAL_CAPSULE | Freq: Two times a day (BID) | ORAL | Status: DC

## 2015-09-14 MED FILL — QUINAPRIL 40 MG TABLET: 40 | 90 days supply | Qty: 90 | Fill #2

## 2015-09-14 MED FILL — metFORMIN HCL 1000 MG TABS: 1000 | 90 days supply | Qty: 180 | Fill #2

## 2015-09-14 MED FILL — JANUVIA 100 MG TABLET: 100 | 90 days supply | Qty: 90 | Fill #1

## 2015-09-14 MED FILL — VERAPAMIL 240 MG CAP PELLET: 240 | 90 days supply | Qty: 90 | Fill #2

## 2015-09-14 MED FILL — SPIRONOLACTONE 50 MG TABLET: 50 | 90 days supply | Qty: 90 | Fill #2

## 2015-10-05 ENCOUNTER — Other Ambulatory Visit: Payer: Self-pay | Admitting: Internal Medicine

## 2015-10-21 MED FILL — IBUPROFEN 600 MG TABLET: 600 | 90 days supply | Qty: 180 | Fill #0

## 2015-11-03 ENCOUNTER — Encounter: Payer: Self-pay | Admitting: Internal Medicine

## 2015-11-03 ENCOUNTER — Other Ambulatory Visit (INDEPENDENT_AMBULATORY_CARE_PROVIDER_SITE_OTHER): Payer: 59

## 2015-11-03 ENCOUNTER — Ambulatory Visit (INDEPENDENT_AMBULATORY_CARE_PROVIDER_SITE_OTHER): Payer: 59 | Admitting: Internal Medicine

## 2015-11-03 VITALS — BP 132/72 | HR 70 | Ht 69.0 in | Wt 211.0 lb

## 2015-11-03 DIAGNOSIS — E049 Nontoxic goiter, unspecified: Secondary | ICD-10-CM

## 2015-11-03 DIAGNOSIS — E785 Hyperlipidemia, unspecified: Secondary | ICD-10-CM | POA: Diagnosis not present

## 2015-11-03 DIAGNOSIS — E1169 Type 2 diabetes mellitus with other specified complication: Secondary | ICD-10-CM | POA: Diagnosis not present

## 2015-11-03 DIAGNOSIS — Z0000 Encounter for general adult medical examination without abnormal findings: Secondary | ICD-10-CM

## 2015-11-03 DIAGNOSIS — E113592 Type 2 diabetes mellitus with proliferative diabetic retinopathy without macular edema, left eye: Secondary | ICD-10-CM

## 2015-11-03 LAB — CBC WITH DIFFERENTIAL/PLATELET
BASOS ABS: 0 10*3/uL (ref 0.0–0.1)
Basophils Relative: 0.4 % (ref 0.0–3.0)
EOS ABS: 0.1 10*3/uL (ref 0.0–0.7)
Eosinophils Relative: 1.2 % (ref 0.0–5.0)
HEMATOCRIT: 31.8 % — AB (ref 36.0–46.0)
Hemoglobin: 10.7 g/dL — ABNORMAL LOW (ref 12.0–15.0)
LYMPHS PCT: 31.3 % (ref 12.0–46.0)
Lymphs Abs: 1.7 10*3/uL (ref 0.7–4.0)
MCHC: 33.6 g/dL (ref 30.0–36.0)
MCV: 89.3 fl (ref 78.0–100.0)
MONOS PCT: 5.8 % (ref 3.0–12.0)
Monocytes Absolute: 0.3 10*3/uL (ref 0.1–1.0)
NEUTROS ABS: 3.3 10*3/uL (ref 1.4–7.7)
Neutrophils Relative %: 61.3 % (ref 43.0–77.0)
PLATELETS: 256 10*3/uL (ref 150.0–400.0)
RBC: 3.56 Mil/uL — AB (ref 3.87–5.11)
RDW: 14.4 % (ref 11.5–15.5)
WBC: 5.4 10*3/uL (ref 4.0–10.5)

## 2015-11-03 LAB — BASIC METABOLIC PANEL
BUN: 17 mg/dL (ref 6–23)
CHLORIDE: 110 meq/L (ref 96–112)
CO2: 25 meq/L (ref 19–32)
CREATININE: 0.93 mg/dL (ref 0.40–1.20)
Calcium: 8.9 mg/dL (ref 8.4–10.5)
GFR: 76.73 mL/min (ref >60.00)
Glucose, Bld: 97 mg/dL (ref 70–99)
POTASSIUM: 5 meq/L (ref 3.5–5.1)
Sodium: 140 mEq/L (ref 135–145)

## 2015-11-03 LAB — HEPATIC FUNCTION PANEL
ALT: 10 U/L (ref 0–35)
AST: 9 U/L (ref 0–37)
Albumin: 3.6 g/dL (ref 3.5–5.2)
Alkaline Phosphatase: 63 U/L (ref 39–117)
BILIRUBIN TOTAL: 0.3 mg/dL (ref 0.2–1.2)
Bilirubin, Direct: 0 mg/dL (ref 0.0–0.3)
Total Protein: 6.5 g/dL (ref 6.0–8.3)

## 2015-11-03 LAB — LIPID PANEL
CHOLESTEROL: 184 mg/dL (ref 0–200)
HDL: 58.5 mg/dL (ref >39.00)
LDL CALC: 111 mg/dL — AB (ref 0–99)
NonHDL: 125.18
TRIGLYCERIDES: 73 mg/dL (ref 0.0–149.0)
Total CHOL/HDL Ratio: 3
VLDL: 14.6 mg/dL (ref 0.0–40.0)

## 2015-11-03 LAB — URINALYSIS
Bilirubin Urine: NEGATIVE
Hgb urine dipstick: NEGATIVE
Ketones, ur: NEGATIVE
Leukocytes, UA: NEGATIVE
NITRITE: NEGATIVE
PH: 6 (ref 5.0–8.0)
SPECIFIC GRAVITY, URINE: 1.01 (ref 1.000–1.030)
TOTAL PROTEIN, URINE-UPE24: NEGATIVE
Urine Glucose: NEGATIVE
Urobilinogen, UA: 0.2 (ref 0.0–1.0)

## 2015-11-03 LAB — POCT GLYCOSYLATED HEMOGLOBIN (HGB A1C): HEMOGLOBIN A1C: 5.9

## 2015-11-03 LAB — TSH: TSH: 1.47 u[IU]/mL (ref 0.35–4.50)

## 2015-11-03 NOTE — Patient Instructions (Signed)
Please continue: - Metformin 1000 mg twice a day - Januvia at the 100 mg daily  Please come back for a follow-up appointment in 4 months.

## 2015-11-03 NOTE — Progress Notes (Signed)
Patient ID: Katelyn Peterson, female   DOB: 1945/06/15, 70 y.o.   MRN: [id number]  HPI: Katelyn Peterson is a 70 y.o.-year-old female, returning for f/u for DM2, dx 2000, non-insulin-dependent, controlled, with complications (DR). Last visit 4 mo ago.  Last hemoglobin A1c was: Lab Results  Component Value Date   HGBA1C 6.0 03/04/2015   HGBA1C 6.2 11/03/2014   HGBA1C 6.8 (H) 07/06/2014   Pt is on a regimen of: - Metformin 1000 mg po bid - Januvia 100 mg qd We stopped Amaryl 2 mg bid and Actos 30 mg daily for daily hypoglycemia events and possible SEs, respectively.  Pt checks her sugars 1-2x a day and they are all at goal: - am: 90-110 >> 91-133 >> 120s >> n/c >> 100-120 >> 93, 105 >> n/c >> 107 >> 88-121 >> 80s-120s >> 80-129 >> 78-117, 121 - 2h after b'fast: 89-130 >> 96 >> 108, 109 >> 123 >> n/c >> 131 - before lunch:85-133 >> n/c >> 93-140 >> n/c >> 114-120 >> 68-116 >> 84-109 >> 102 >> n/c - 2h after lunch:120-125 >> 81-141 >> 78-123 >> 83-122, 136, 151 >> 97-121, 138 >> 84-117, 137 >> 110, 124 >> 99-100 >> n/c >> 110 - before dinner: ~120 >> 78-127 >> n/c >> 84-110 >> 72-118 >> 84-98, 130 >> n/c >> 76-101 >> 70-110 - 2h after dinner: 138 >> n/c >> 125 >> n/c - bedtime: not checking, but not feeling hypoglycemic >> 100-120 >> n/c >> 120s >> 90 >> 70 Lowest sugar was 68 >> 88;  she has hypoglycemia awareness at 80. Highest sugar was 137 >> 130 >> 120s >> 131 >> 121.  - no CKD, last BUN/creatinine:  Lab Results  Component Value Date   BUN 20 11/03/2014   CREATININE 0.95 11/03/2014  She is on quinapril 40.  - last set of lipids: Lab Results  Component Value Date   CHOL 221 (H) 11/03/2014   HDL 50.20 11/03/2014   LDLCALC 154 (H) 11/03/2014   LDLDIRECT 146.5 09/18/2012   TRIG 81.0 11/03/2014   CHOLHDL 4 11/03/2014  She developed leg cramps with pravastatin and atorvastatin. She started Crestor once a week, not every week.  - last eye exam was 05/2015. 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 - no numbness and tingling in her feet.  She also has a history of hypertension, hyperlipidemia, obesity. She has been considered for lap band surgery >> she would not want to do this. She also has bilateral chronic lower extremity cramps, right knee pain, anemia.  ROS: Constitutional: no weight loss/gain, no fatigue, no subjective hyperthermia/hypothermia Eyes: no blurry vision, no xerophthalmia ENT: no sore throat, no nodules palpated in throat, no dysphagia/odynophagia, no hoarseness Cardiovascular: no CP/SOB/palpitations/leg swelling Respiratory: no cough/SOB Gastrointestinal: no N/V/D/C Musculoskeletal: no muscle/no joint aches  Skin: no rashes Neurological: no tremors/numbness/tingling/dizziness  I reviewed pt's medications, allergies, PMH, social hx, family hx, and changes were documented in the history of present illness. Otherwise, unchanged from my initial visit note.  PE: BP 132/72 (BP Location: Right Arm, Patient Position: Sitting)   Pulse 70   Ht 5\' 9"  (1.753 m)   Wt 211 lb (95.7 kg)   SpO2 98%   BMI 31.16 kg/m  Body mass index is 31.16 kg/m.  Wt Readings from Last 3 Encounters:  11/03/15 211 lb (95.7 kg)  07/04/15 205 lb (93 kg)  06/22/15 211 lb (95.7 kg)   Constitutional: obese, in NAD Eyes: PERRLA, EOMI, no exophthalmos ENT: moist  mucous membranes, no thyromegaly, no cervical lymphadenopathy Cardiovascular: RRR, No MRG, + periankle swelling B (pitting) Respiratory: CTA B Gastrointestinal: abdomen soft, NT, ND, BS+ Musculoskeletal: no deformities, strength intact in all 4 Skin: moist, warm, no rashes  ASSESSMENT: 1. 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 Hyperlipidemia - Developed leg cramps with pravastatin and atorvastatin - started Crestor - not taking it every week  3. Goiter by palpation 11/04/2012: Thyroid U/S:  Right thyroid lobe: 4.3 x 1.0 x 1.8 cm   Left thyroid lobe: 4.6 x 1.2 x 1.9 cm   Isthmus: 5  mm   Focal nodules: Thyroid echotexture is homogeneous, without solid or cystic lesion.   Lymphadenopathy: None visualized.  IMPRESSION:  Normal thyroid ultrasound. No evidence of thyromegaly or dominant solid/cystic mass.  PLAN:  1. DM2 Patient with long-standing, now well controlled diabetes, on oral antidiabetic regimen, with all CBGs at goal. No low CBGs. Will continue current regimen.  - I suggested to:  Patient Instructions  Please continue: - Metformin 1000 mg twice a day - Januvia 100 mg daily  Please come back for a follow-up appointment in 4 months.  - continue checking her sugars at different times of the day - will go to [place] office to have labs today - Will check hemoglobin A1c >> 5.9% (great!, better) - Return to clinic in 4 months with sugar log   2. Hyperlipidemia - Latest Lipid panel from 10/2014 reviewed with the pt: LDL worse - she is taking Crestor 5 mg weekly >> but not every week b/c cramps  3. Goiter - stable - no neck compression sxs  Philemon Kingdom, MD PhD [facility] Endocrinology

## 2015-11-03 NOTE — Addendum Note (Signed)
Addended by: Caprice Beaver T on: 11/03/2015 09:52 AM   Modules accepted: Orders

## 2015-11-04 LAB — HEPATITIS C ANTIBODY: HCV Ab: NEGATIVE

## 2015-11-30 DIAGNOSIS — H2513 Age-related nuclear cataract, bilateral: Secondary | ICD-10-CM | POA: Diagnosis not present

## 2015-11-30 DIAGNOSIS — H25013 Cortical age-related cataract, bilateral: Secondary | ICD-10-CM | POA: Diagnosis not present

## 2015-11-30 DIAGNOSIS — E113293 Type 2 diabetes mellitus with mild nonproliferative diabetic retinopathy without macular edema, bilateral: Secondary | ICD-10-CM | POA: Diagnosis not present

## 2015-12-22 ENCOUNTER — Ambulatory Visit: Payer: 59 | Admitting: Internal Medicine

## 2015-12-27 MED FILL — QUINAPRIL 40 MG TABLET: 40 | 90 days supply | Qty: 90 | Fill #3

## 2015-12-27 MED FILL — JANUVIA 100 MG TABLET: 100 | 90 days supply | Qty: 90 | Fill #2

## 2016-01-04 ENCOUNTER — Ambulatory Visit (INDEPENDENT_AMBULATORY_CARE_PROVIDER_SITE_OTHER): Payer: 59 | Admitting: Internal Medicine

## 2016-01-04 ENCOUNTER — Encounter: Payer: Self-pay | Admitting: Internal Medicine

## 2016-01-04 VITALS — BP 126/82 | HR 88 | Temp 97.6°F | Ht 63.0 in | Wt 203.0 lb

## 2016-01-04 DIAGNOSIS — E785 Hyperlipidemia, unspecified: Secondary | ICD-10-CM

## 2016-01-04 DIAGNOSIS — E113592 Type 2 diabetes mellitus with proliferative diabetic retinopathy without macular edema, left eye: Secondary | ICD-10-CM

## 2016-01-04 DIAGNOSIS — Z0000 Encounter for general adult medical examination without abnormal findings: Secondary | ICD-10-CM | POA: Diagnosis not present

## 2016-01-04 DIAGNOSIS — E1169 Type 2 diabetes mellitus with other specified complication: Secondary | ICD-10-CM | POA: Diagnosis not present

## 2016-01-04 DIAGNOSIS — Z23 Encounter for immunization: Secondary | ICD-10-CM

## 2016-01-04 DIAGNOSIS — Z1211 Encounter for screening for malignant neoplasm of colon: Secondary | ICD-10-CM

## 2016-01-04 NOTE — Assessment & Plan Note (Signed)
Doing well on diet ?

## 2016-01-04 NOTE — Addendum Note (Signed)
Addended by: Cresenciano Lick on: 01/04/2016 10:32 AM   Modules accepted: Orders

## 2016-01-04 NOTE — Progress Notes (Signed)
Pre visit review using our clinic review tool, if applicable. No additional management support is needed unless otherwise documented below in the visit note. 

## 2016-01-04 NOTE — Assessment & Plan Note (Signed)
Metformin, Januvia, Crestor

## 2016-01-04 NOTE — Assessment & Plan Note (Signed)
On Crestor 

## 2016-01-04 NOTE — Assessment & Plan Note (Signed)
We discussed age appropriate health related issues, including available/recomended screening tests and vaccinations. We discussed a need for adhering to healthy diet and exercise. Labs/EKG were reviewed/ordered. All questions were answered.  Colon due 2018 Dr Ardis Hughs

## 2016-01-04 NOTE — Progress Notes (Signed)
Subjective:  Patient ID: Katelyn Peterson, female    DOB: 06/26/1945  Age: 70 y.o. MRN: [id number]  CC: Follow-up (6 months follow up)   HPI Katelyn Peterson presents for a well exam. F/u HTN, DM. Lost wt on diet  Outpatient Medications Prior to Visit  Medication Sig Dispense Refill  . benzonatate (TESSALON) 200 MG capsule Take 1 capsule (200 mg total) by mouth 3 (three) times daily as needed for cough. 20 capsule 0  . cetirizine (ZYRTEC) 10 MG tablet TAKE ONE TABLET BY MOUTH ONCE DAILY 70 tablet 5  . Cholecalciferol 1000 UNITS tablet Take 1,000 Units by mouth daily.      . ciclopirox (PENLAC) 8 % solution Apply topically at bedtime. Apply over nail and surrounding skin. Apply daily over previous coat. After seven (7) days, may remove with alcohol and continue cycle. 6.6 mL 0  . Diclofenac Sodium 2 % SOLN Apply 1 pump twice daily. 112 g 3  . doxycycline (VIBRAMYCIN) 100 MG capsule Take 1 capsule (100 mg total) by mouth 2 (two) times daily. 14 capsule 0  . ibuprofen (ADVIL,MOTRIN) 600 MG tablet TAKE 1 TABLET BY MOUTH TWICE DAILY AFTER MEALS FOR 1 WEEK, THEN AS NEEDED FOR PAIN 60 tablet 2  . loratadine (CLARITIN) 10 MG tablet Take 1 tablet (10 mg total) by mouth daily. 90 tablet 0  . Menthol, Topical Analgesic, (BIOFREEZE) 4 % GEL Apply to affected area daily as needed 150 mL 0  . metFORMIN (GLUCOPHAGE) 1000 MG tablet Take 1 tablet (1,000 mg total) by mouth 2 (two) times daily with a meal. 180 tablet 2  . quinapril (ACCUPRIL) 40 MG tablet Take 1 tablet (40 mg total) by mouth daily. 90 tablet 3  . rosuvastatin (CRESTOR) 5 MG tablet Take 1 tablet (5 mg total) by mouth daily. 90 tablet 3  . sitaGLIPtin (JANUVIA) 100 MG tablet Take 1 tablet (100 mg total) by mouth daily. 90 tablet 3  . spironolactone (ALDACTONE) 50 MG tablet Take 1 tablet (50 mg total) by mouth daily. 90 tablet 3  . traMADol (ULTRAM) 50 MG tablet Take 1-2 tablets (50-100 mg total) by mouth 2 (two) times daily as needed. 100  tablet 1  . verapamil (VERELAN PM) 240 MG 24 hr capsule Take 1 capsule (240 mg total) by mouth at bedtime. 90 capsule 3   No facility-administered medications prior to visit.     ROS Review of Systems  Constitutional: Negative for activity change, appetite change, chills, fatigue and unexpected weight change.  HENT: Negative for congestion, mouth sores and sinus pressure.   Eyes: Negative for visual disturbance.  Respiratory: Negative for cough and chest tightness.   Gastrointestinal: Negative for abdominal pain and nausea.  Genitourinary: Negative for difficulty urinating, frequency and vaginal pain.  Musculoskeletal: Negative for back pain and gait problem.  Skin: Negative for pallor and rash.  Neurological: Negative for dizziness, tremors, weakness, numbness and headaches.  Psychiatric/Behavioral: Negative for confusion and sleep disturbance.    Objective:  BP 126/82 (BP Location: Left Arm, Patient Position: Sitting, Cuff Size: Large)   Pulse 88   Temp 97.6 F (36.4 C)   Ht 5\' 3"  (1.6 m)   Wt 203 lb (92.1 kg)   SpO2 98%   BMI 35.96 kg/m   BP Readings from Last 3 Encounters:  01/04/16 126/82  11/03/15 132/72  07/04/15 124/80    Wt Readings from Last 3 Encounters:  01/04/16 203 lb (92.1 kg)  11/03/15 211 lb (95.7 kg)  07/04/15 205 lb (93 kg)    Physical Exam  Constitutional: She appears well-developed. No distress.  HENT:  Head: Normocephalic.  Right Ear: External ear normal.  Left Ear: External ear normal.  Nose: Nose normal.  Mouth/Throat: Oropharynx is clear and moist.  Eyes: Conjunctivae are normal. Pupils are equal, round, and reactive to light. Right eye exhibits no discharge. Left eye exhibits no discharge.  Neck: Normal range of motion. Neck supple. No JVD present. No tracheal deviation present. No thyromegaly present.  Cardiovascular: Normal rate, regular rhythm and normal heart sounds.   Pulmonary/Chest: No stridor. No respiratory distress. She has no  wheezes.  Abdominal: Soft. Bowel sounds are normal. She exhibits no distension and no mass. There is no tenderness. There is no rebound and no guarding.  Musculoskeletal: She exhibits no edema or tenderness.  Lymphadenopathy:    She has no cervical adenopathy.  Neurological: She displays normal reflexes. No cranial nerve deficit. She exhibits normal muscle tone. Coordination normal.  Skin: No rash noted. No erythema.  Psychiatric: She has a normal mood and affect. Her behavior is normal. Judgment and thought content normal.  Obese  Lab Results  Component Value Date   WBC 5.4 11/03/2015   HGB 10.7 (L) 11/03/2015   HCT 31.8 (L) 11/03/2015   PLT 256.0 11/03/2015   GLUCOSE 97 11/03/2015   CHOL 184 11/03/2015   TRIG 73.0 11/03/2015   HDL 58.50 11/03/2015   LDLDIRECT 146.5 09/18/2012   LDLCALC 111 (H) 11/03/2015   ALT 10 11/03/2015   AST 9 11/03/2015   NA 140 11/03/2015   K 5.0 11/03/2015   CL 110 11/03/2015   CREATININE 0.93 11/03/2015   BUN 17 11/03/2015   CO2 25 11/03/2015   TSH 1.47 11/03/2015   HGBA1C 5.9 11/03/2015    Dg Ankle Complete Left  Result Date: 04/01/2015 CLINICAL DATA:  Anterior left ankle pain for 6 months. No known injury. History of bone spur. EXAM: LEFT ANKLE COMPLETE - 3+ VIEW COMPARISON:  Radiographs 08/22/2011. FINDINGS: The bones are demineralized. There is no evidence of acute fracture or dislocation. Spurring of the medial malleolus and posterior aspect of the calcaneal tuberosity is unchanged. The inferior aspect of the calcaneus is not imaged. Moderate midfoot degenerative changes are noted. The soft tissue surrounding the ankle are diffusely prominent as before. There are scattered vascular calcifications. IMPRESSION: Stable left ankle radiographs. Degenerative changes as described without acute findings. Electronically Signed   By: Richardean Sale M.D.   On: 04/01/2015 13:36   [place] Extrem Low Left Ltd  Result Date: 04/04/2015 MS[facility] US performed of: *Left  ankle This study was ordered, performed, and interpreted by Charlann Boxer D.O. Foot/Ankle:  All structures visualized.  Talar dome has anterior spurring. Large anterior spur noted right near the anterior tibialis tendon. Hypoechoic changes and increasing Doppler flow Ankle mortise with trace effusion Peroneus longus and brevis tendons unremarkable on long and transverse views without sheath effusions. Posterior tibialis, flexor hallucis longus, and flexor digitorum longus tendons unremarkable on long and transverse views without sheath effusions. Achilles tendon visualized along length of tendon and unremarkable on long and transverse views without sheath effusion. Anterior Talofibular Ligament and Calcaneofibular Ligaments unremarkable and intact. Deltoid Ligament unremarkable and intact. Plantar fascia intact and without effusion, normal thickness. No increased doppler signal, cap sign, or thickening of tibial cortex. Power doppler signal normal.   Moderate ankle arthritis with small effusion and spurring of the anterior aspect causing anterior tibialis tendinitis    Assessment &  Plan:   There are no diagnoses linked to this encounter. I am having Ms. Arai maintain her Cholecalciferol, loratadine, Menthol (Topical Analgesic), cetirizine, metFORMIN, quinapril, verapamil, rosuvastatin, spironolactone, Diclofenac Sodium, sitaGLIPtin, traMADol, ciclopirox, doxycycline, benzonatate, and ibuprofen.  No orders of the defined types were placed in this encounter.    Follow-up: No Follow-up on file.  Walker Kehr, MD

## 2016-02-15 ENCOUNTER — Other Ambulatory Visit: Payer: Self-pay | Admitting: Internal Medicine

## 2016-02-15 MED FILL — VERAPAMIL ER 240 MG CAPSULE: 240 | 90 days supply | Qty: 90 | Fill #3

## 2016-02-15 MED FILL — metFORMIN HCL 1000 MG TABS: 1000 | 90 days supply | Qty: 180 | Fill #0

## 2016-02-15 MED FILL — SPIRONOLACTONE 50 MG TABLET: 50 | 90 days supply | Qty: 90 | Fill #3

## 2016-02-27 ENCOUNTER — Encounter: Payer: Self-pay | Admitting: Internal Medicine

## 2016-02-27 ENCOUNTER — Ambulatory Visit (INDEPENDENT_AMBULATORY_CARE_PROVIDER_SITE_OTHER): Payer: 59 | Admitting: Internal Medicine

## 2016-02-27 VITALS — BP 126/74 | Ht 63.0 in | Wt 206.2 lb

## 2016-02-27 DIAGNOSIS — E113592 Type 2 diabetes mellitus with proliferative diabetic retinopathy without macular edema, left eye: Secondary | ICD-10-CM

## 2016-02-27 DIAGNOSIS — E785 Hyperlipidemia, unspecified: Secondary | ICD-10-CM

## 2016-02-27 DIAGNOSIS — E1169 Type 2 diabetes mellitus with other specified complication: Secondary | ICD-10-CM

## 2016-02-27 DIAGNOSIS — E049 Nontoxic goiter, unspecified: Secondary | ICD-10-CM

## 2016-02-27 LAB — POCT GLYCOSYLATED HEMOGLOBIN (HGB A1C): HEMOGLOBIN A1C: 5.9

## 2016-02-27 NOTE — Patient Instructions (Signed)
Please continue: - Metformin 1000 mg twice a day - Januvia 100 mg daily in am  Please come back for a follow-up appointment in 4 months.

## 2016-02-27 NOTE — Progress Notes (Signed)
Patient ID: Katelyn Peterson, female   DOB: [birth year], 70 y.o.   MRN: [id number]  HPI: Katelyn Peterson is a 70 y.o.-year-old female, returning for f/u for DM2, dx 2000, non-insulin-dependent, controlled, with complications (mild PDR in OU). Last visit 4 mo ago.  Last hemoglobin A1c was: Lab Results  Component Value Date   HGBA1C 5.9 11/03/2015   HGBA1C 6.0 03/04/2015   HGBA1C 6.2 11/03/2014   Pt is on a regimen of: - Metformin 1000 mg po bid - Januvia 100 mg qd We stopped Amaryl 2 mg bid and Actos 30 mg daily for daily hypoglycemia events and possible SEs, respectively.  Pt checks her sugars 1-2x a day and they are all at goal: - am: 93, 105 >> n/c >> 107 >> 88-121 >> 80s-120s >> 80-129 >> 78-117, 121 >> 85-115, 122 - 2h after b'fast: 89-130 >> 96 >> 108, 109 >> 123 >> n/c >> 131 - before lunch: 93-140 >> n/c >> 114-120 >> 68-116 >> 84-109 >> 102 >> n/c - 2h after lunch:97-121, 138 >> 84-117, 137 >> 110, 124 >> 99-100 >> n/c >> 110 >> n/c - before dinner: 72-118 >> 84-98, 130 >> n/c >> 76-101 >> 70-110 >> 70 - 2h after dinner: 138 >> n/c >> 125 >> n/c - bedtime:  100-120 >> n/c >> 120s >> 90 >> 70 Lowest sugar was 68 >> 88 >> 70;  she has hypoglycemia awareness at 80. Highest sugar was 121 >> 129.  - no CKD, last BUN/creatinine:  Lab Results  Component Value Date   BUN 17 11/03/2015   CREATININE 0.93 11/03/2015  She is on quinapril 40.  - last set of lipids: Lab Results  Component Value Date   CHOL 184 11/03/2015   HDL 58.50 11/03/2015   LDLCALC 111 (H) 11/03/2015   LDLDIRECT 146.5 09/18/2012   TRIG 73.0 11/03/2015   CHOLHDL 3 11/03/2015  She developed leg cramps with pravastatin and atorvastatin. She started Crestor every other week.  - last eye exam was 05/2015. 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 - no numbness and tingling in her feet.  She also has a history of hypertension, hyperlipidemia, obesity. She has been considered for lap band surgery >> she would not want to do  this. She also has bilateral chronic lower extremity cramps, right knee pain, anemia.  ROS: Constitutional: no weight loss/gain, no fatigue, no subjective hyperthermia/hypothermia Eyes: no blurry vision, no xerophthalmia ENT: no sore throat, no nodules palpated in throat, no dysphagia/odynophagia, no hoarseness Cardiovascular: no CP/SOB/palpitations/leg swelling Respiratory: no cough/SOB Gastrointestinal: no N/V/D/C Musculoskeletal: no muscle/no joint aches  Skin: no rashes Neurological: no tremors/numbness/tingling/dizziness  I reviewed pt's medications, allergies, PMH, social hx, family hx, and changes were documented in the history of present illness. Otherwise, unchanged from my initial visit note.  PE: There were no vitals taken for this visit. There is no height or weight on file to calculate BMI.  Wt Readings from Last 3 Encounters:  01/04/16 203 lb (92.1 kg)  11/03/15 211 lb (95.7 kg)  07/04/15 205 lb (93 kg)   Constitutional: obese, in NAD Eyes: PERRLA, EOMI, no exophthalmos ENT: moist mucous membranes, no thyromegaly, no cervical lymphadenopathy Cardiovascular: RRR, No MRG, + periankle swelling B (pitting) Respiratory: CTA B Gastrointestinal: abdomen soft, NT, ND, BS+ Musculoskeletal: no deformities, strength intact in all 4 Skin: moist, warm, no rashes  ASSESSMENT: 1. 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 Hyperlipidemia - Developed leg cramps with pravastatin and atorvastatin -  started Crestor - not taking it every week  3. Goiter by palpation 11/04/2012: Thyroid U/S:  Right thyroid lobe: 4.3 x 1.0 x 1.8 cm   Left thyroid lobe: 4.6 x 1.2 x 1.9 cm   Isthmus: 5 mm   Focal nodules: Thyroid echotexture is homogeneous, without solid or cystic lesion.   Lymphadenopathy: None visualized.  IMPRESSION:  Normal thyroid ultrasound. No evidence of thyromegaly or dominant solid/cystic mass.  PLAN:  1. DM2 Patient with long-standing,  now well controlled diabetes, on oral antidiabetic regimen, with all CBGs at goal. No low CBGs. Will continue current regimen.  - I suggested to:  Patient Instructions  Please continue: - Metformin 1000 mg twice a day - Januvia 100 mg daily in am  Please come back for a follow-up appointment in 4 months.  - continue checking her sugars 1x a day or every other day but check at different times of the day - Will check hemoglobin A1c >> 5.9% (great!, stable) - Return to clinic in 4 months with sugar log   Philemon Kingdom, MD PhD [facility] Endocrinology  2. Hyperlipidemia - Latest Lipid panel from 10/2015 reviewed with the pt: LDL much better - she is taking Crestor 5 mg qow (b/c cramps)  3. Non-nodular Goiter - stable - reviewed latest thyroid U/S results - no neck compression sxs  Philemon Kingdom, MD PhD [facility] Endocrinology

## 2016-04-03 MED FILL — JANUVIA 100 MG TABLET: 100 | 30 days supply | Qty: 30 | Fill #3

## 2016-05-08 ENCOUNTER — Ambulatory Visit (INDEPENDENT_AMBULATORY_CARE_PROVIDER_SITE_OTHER): Payer: 59 | Admitting: Internal Medicine

## 2016-05-08 ENCOUNTER — Encounter: Payer: Self-pay | Admitting: Internal Medicine

## 2016-05-08 ENCOUNTER — Other Ambulatory Visit: Payer: Self-pay | Admitting: Internal Medicine

## 2016-05-08 ENCOUNTER — Other Ambulatory Visit (INDEPENDENT_AMBULATORY_CARE_PROVIDER_SITE_OTHER): Payer: 59

## 2016-05-08 VITALS — BP 140/62 | HR 66 | Temp 97.8°F | Resp 16 | Ht 63.0 in | Wt 205.0 lb

## 2016-05-08 DIAGNOSIS — Z0000 Encounter for general adult medical examination without abnormal findings: Secondary | ICD-10-CM

## 2016-05-08 DIAGNOSIS — E113592 Type 2 diabetes mellitus with proliferative diabetic retinopathy without macular edema, left eye: Secondary | ICD-10-CM

## 2016-05-08 DIAGNOSIS — I10 Essential (primary) hypertension: Secondary | ICD-10-CM

## 2016-05-08 DIAGNOSIS — Z1211 Encounter for screening for malignant neoplasm of colon: Secondary | ICD-10-CM | POA: Diagnosis not present

## 2016-05-08 DIAGNOSIS — D638 Anemia in other chronic diseases classified elsewhere: Secondary | ICD-10-CM | POA: Insufficient documentation

## 2016-05-08 DIAGNOSIS — D649 Anemia, unspecified: Secondary | ICD-10-CM | POA: Diagnosis not present

## 2016-05-08 DIAGNOSIS — E1169 Type 2 diabetes mellitus with other specified complication: Secondary | ICD-10-CM

## 2016-05-08 DIAGNOSIS — E785 Hyperlipidemia, unspecified: Secondary | ICD-10-CM | POA: Diagnosis not present

## 2016-05-08 LAB — CBC WITH DIFFERENTIAL/PLATELET
BASOS ABS: 0 10*3/uL (ref 0.0–0.1)
Basophils Relative: 0.7 % (ref 0.0–3.0)
EOS ABS: 0 10*3/uL (ref 0.0–0.7)
Eosinophils Relative: 0.8 % (ref 0.0–5.0)
HCT: 31.8 % — ABNORMAL LOW (ref 36.0–46.0)
Hemoglobin: 10.6 g/dL — ABNORMAL LOW (ref 12.0–15.0)
LYMPHS ABS: 1.8 10*3/uL (ref 0.7–4.0)
LYMPHS PCT: 32 % (ref 12.0–46.0)
MCHC: 33.3 g/dL (ref 30.0–36.0)
MCV: 89.4 fl (ref 78.0–100.0)
Monocytes Absolute: 0.4 10*3/uL (ref 0.1–1.0)
Monocytes Relative: 6.4 % (ref 3.0–12.0)
NEUTROS ABS: 3.5 10*3/uL (ref 1.4–7.7)
NEUTROS PCT: 60.1 % (ref 43.0–77.0)
PLATELETS: 264 10*3/uL (ref 150.0–400.0)
RBC: 3.55 Mil/uL — ABNORMAL LOW (ref 3.87–5.11)
RDW: 13.5 % (ref 11.5–15.5)
WBC: 5.8 10*3/uL (ref 4.0–10.5)

## 2016-05-08 LAB — IBC PANEL
Iron: 86 ug/dL (ref 42–145)
SATURATION RATIOS: 18.4 % — AB (ref 20.0–50.0)
TRANSFERRIN: 334 mg/dL (ref 212.0–360.0)

## 2016-05-08 LAB — MICROALBUMIN / CREATININE URINE RATIO
CREATININE, U: 226.3 mg/dL
MICROALB UR: 2 mg/dL — AB (ref 0.0–1.9)
Microalb Creat Ratio: 0.9 mg/g (ref 0.0–30.0)

## 2016-05-08 MED FILL — QUINAPRIL 40 MG TABLET: 40 | 90 days supply | Qty: 90 | Fill #0

## 2016-05-08 NOTE — Assessment & Plan Note (Signed)
Metfpormin, Januvia

## 2016-05-08 NOTE — Assessment & Plan Note (Signed)
-   Crestor 

## 2016-05-08 NOTE — Assessment & Plan Note (Signed)
Nl BP at work Spironolactone, Quinapril, Verapamil

## 2016-05-08 NOTE — Progress Notes (Signed)
Subjective:  Patient ID: Katelyn Peterson, female    DOB: March 10, 1946  Age: 71 y.o. MRN: [id number]  CC: Follow-up (DM type 2, HTN)   HPI Brittanni Jani Gravel presents for HTN, DM, OA f/u - BP OK at home  Outpatient Medications Prior to Visit  Medication Sig Dispense Refill  . benzonatate (TESSALON) 200 MG capsule Take 1 capsule (200 mg total) by mouth 3 (three) times daily as needed for cough. 20 capsule 0  . cetirizine (ZYRTEC) 10 MG tablet TAKE ONE TABLET BY MOUTH ONCE DAILY 70 tablet 5  . Cholecalciferol 1000 UNITS tablet Take 1,000 Units by mouth daily.      . ciclopirox (PENLAC) 8 % solution Apply topically at bedtime. Apply over nail and surrounding skin. Apply daily over previous coat. After seven (7) days, may remove with alcohol and continue cycle. 6.6 mL 0  . Diclofenac Sodium 2 % SOLN Apply 1 pump twice daily. 112 g 3  . ibuprofen (ADVIL,MOTRIN) 600 MG tablet TAKE 1 TABLET BY MOUTH TWICE DAILY AFTER MEALS FOR 1 WEEK, THEN AS NEEDED FOR PAIN 60 tablet 2  . loratadine (CLARITIN) 10 MG tablet Take 1 tablet (10 mg total) by mouth daily. 90 tablet 0  . Menthol, Topical Analgesic, (BIOFREEZE) 4 % GEL Apply to affected area daily as needed 150 mL 0  . metFORMIN (GLUCOPHAGE) 1000 MG tablet TAKE 1 TABLET BY MOUTH TWICE A DAY WITH MEALS 180 tablet 3  . quinapril (ACCUPRIL) 40 MG tablet Take 1 tablet (40 mg total) by mouth daily. 90 tablet 3  . rosuvastatin (CRESTOR) 5 MG tablet Take 1 tablet (5 mg total) by mouth daily. 90 tablet 3  . sitaGLIPtin (JANUVIA) 100 MG tablet Take 1 tablet (100 mg total) by mouth daily. 90 tablet 3  . spironolactone (ALDACTONE) 50 MG tablet Take 1 tablet (50 mg total) by mouth daily. 90 tablet 3  . traMADol (ULTRAM) 50 MG tablet Take 1-2 tablets (50-100 mg total) by mouth 2 (two) times daily as needed. 100 tablet 1  . verapamil (VERELAN PM) 240 MG 24 hr capsule Take 1 capsule (240 mg total) by mouth at bedtime. 90 capsule 3  . doxycycline (VIBRAMYCIN) 100 MG  capsule Take 1 capsule (100 mg total) by mouth 2 (two) times daily. 14 capsule 0   No facility-administered medications prior to visit.     ROS Review of Systems  Constitutional: Negative for activity change, appetite change, chills, fatigue and unexpected weight change.  HENT: Negative for congestion, mouth sores and sinus pressure.   Eyes: Negative for visual disturbance.  Respiratory: Negative for cough and chest tightness.   Gastrointestinal: Negative for abdominal pain and nausea.  Genitourinary: Negative for difficulty urinating, frequency and vaginal pain.  Musculoskeletal: Positive for arthralgias. Negative for back pain and gait problem.  Skin: Negative for pallor and rash.  Neurological: Negative for dizziness, tremors, weakness, numbness and headaches.  Psychiatric/Behavioral: Negative for confusion and sleep disturbance.    Objective:  BP 140/62   Pulse 66   Temp 97.8 F (36.6 C) (Oral)   Resp 16   Ht 5\' 3"  (1.6 m)   Wt 205 lb (93 kg)   SpO2 98%   BMI 36.31 kg/m   BP Readings from Last 3 Encounters:  05/08/16 140/62  02/27/16 126/74  01/04/16 126/82    Wt Readings from Last 3 Encounters:  05/08/16 205 lb (93 kg)  02/27/16 206 lb 3.2 oz (93.5 kg)  01/04/16 203 lb (92.1 kg)  Physical Exam  Constitutional: She appears well-developed. No distress.  HENT:  Head: Normocephalic.  Right Ear: External ear normal.  Left Ear: External ear normal.  Nose: Nose normal.  Mouth/Throat: Oropharynx is clear and moist.  Eyes: Conjunctivae are normal. Pupils are equal, round, and reactive to light. Right eye exhibits no discharge. Left eye exhibits no discharge.  Neck: Normal range of motion. Neck supple. No JVD present. No tracheal deviation present. No thyromegaly present.  Cardiovascular: Normal rate, regular rhythm and normal heart sounds.   Pulmonary/Chest: No stridor. No respiratory distress. She has no wheezes.  Abdominal: Soft. Bowel sounds are normal. She  exhibits no distension and no mass. There is no tenderness. There is no rebound and no guarding.  Musculoskeletal: She exhibits no edema or tenderness.  Lymphadenopathy:    She has no cervical adenopathy.  Neurological: She displays normal reflexes. No cranial nerve deficit. She exhibits normal muscle tone. Coordination normal.  Skin: No rash noted. No erythema.  Psychiatric: She has a normal mood and affect. Her behavior is normal. Judgment and thought content normal.  obese  Lab Results  Component Value Date   WBC 5.4 11/03/2015   HGB 10.7 (L) 11/03/2015   HCT 31.8 (L) 11/03/2015   PLT 256.0 11/03/2015   GLUCOSE 97 11/03/2015   CHOL 184 11/03/2015   TRIG 73.0 11/03/2015   HDL 58.50 11/03/2015   LDLDIRECT 146.5 09/18/2012   LDLCALC 111 (H) 11/03/2015   ALT 10 11/03/2015   AST 9 11/03/2015   NA 140 11/03/2015   K 5.0 11/03/2015   CL 110 11/03/2015   CREATININE 0.93 11/03/2015   BUN 17 11/03/2015   CO2 25 11/03/2015   TSH 1.47 11/03/2015   HGBA1C 5.9 02/27/2016    Dg Ankle Complete Left  Result Date: 04/01/2015 CLINICAL DATA:  Anterior left ankle pain for 6 months. No known injury. History of bone spur. EXAM: LEFT ANKLE COMPLETE - 3+ VIEW COMPARISON:  Radiographs 08/22/2011. FINDINGS: The bones are demineralized. There is no evidence of acute fracture or dislocation. Spurring of the medial malleolus and posterior aspect of the calcaneal tuberosity is unchanged. The inferior aspect of the calcaneus is not imaged. Moderate midfoot degenerative changes are noted. The soft tissue surrounding the ankle are diffusely prominent as before. There are scattered vascular calcifications. IMPRESSION: Stable left ankle radiographs. Degenerative changes as described without acute findings. Electronically Signed   By: Richardean Sale M.D.   On: 04/01/2015 13:36   [place] Extrem Low Left Ltd  Result Date: 04/04/2015 MSK US performed of: *Left ankle This study was ordered, performed, and interpreted  by Charlann Boxer D.O. Foot/Ankle:  All structures visualized.  Talar dome has anterior spurring. Large anterior spur noted right near the anterior tibialis tendon. Hypoechoic changes and increasing Doppler flow Ankle mortise with trace effusion Peroneus longus and brevis tendons unremarkable on long and transverse views without sheath effusions. Posterior tibialis, flexor hallucis longus, and flexor digitorum longus tendons unremarkable on long and transverse views without sheath effusions. Achilles tendon visualized along length of tendon and unremarkable on long and transverse views without sheath effusion. Anterior Talofibular Ligament and Calcaneofibular Ligaments unremarkable and intact. Deltoid Ligament unremarkable and intact. Plantar fascia intact and without effusion, normal thickness. No increased doppler signal, cap sign, or thickening of tibial cortex. Power doppler signal normal.   Moderate ankle arthritis with small effusion and spurring of the anterior aspect causing anterior tibialis tendinitis    Assessment & Plan:   There are no diagnoses  linked to this encounter. I have discontinued Ms. Palecek's doxycycline. I am also having her maintain her Cholecalciferol, loratadine, Menthol (Topical Analgesic), cetirizine, quinapril, verapamil, rosuvastatin, spironolactone, Diclofenac Sodium, sitaGLIPtin, traMADol, ciclopirox, benzonatate, ibuprofen, and metFORMIN.  No orders of the defined types were placed in this encounter.    Follow-up: No Follow-up on file.  Walker Kehr, MD

## 2016-05-08 NOTE — Progress Notes (Signed)
Pre-visit discussion using our clinic review tool. No additional management support is needed unless otherwise documented below in the visit note.  

## 2016-05-08 NOTE — Assessment & Plan Note (Signed)
Labs  Colonoscopy

## 2016-05-17 ENCOUNTER — Encounter: Payer: Self-pay | Admitting: Gastroenterology

## 2016-06-06 ENCOUNTER — Telehealth: Payer: Self-pay

## 2016-06-06 ENCOUNTER — Other Ambulatory Visit: Payer: Self-pay | Admitting: Internal Medicine

## 2016-06-06 MED FILL — SPIRONOLACTONE 50 MG TABLET: 50 | 90 days supply | Qty: 90 | Fill #0

## 2016-06-06 NOTE — Telephone Encounter (Signed)
Pt called wanting a refill on Medication  spironolactone (ALDACTONE) 50 MG tablet [11426]  spironolactone    Please advise.

## 2016-06-06 NOTE — Telephone Encounter (Signed)
Pt left vm and rq rx for flexeril. Please advise

## 2016-06-06 NOTE — Telephone Encounter (Signed)
OK to fill this/these prescription(s) with additional refills x1  Thank you!  

## 2016-06-07 ENCOUNTER — Encounter: Payer: Self-pay | Admitting: Gastroenterology

## 2016-06-07 NOTE — Telephone Encounter (Signed)
What strength?

## 2016-06-09 MED ORDER — CYCLOBENZAPRINE HCL 5 MG PO TABS: 5.0000 mg | ORAL_TABLET | Freq: Two times a day (BID) | ORAL | 1 refills | Status: DC | PRN

## 2016-06-09 NOTE — Telephone Encounter (Signed)
Done. Thx.

## 2016-06-27 ENCOUNTER — Telehealth: Payer: Self-pay | Admitting: Internal Medicine

## 2016-06-27 ENCOUNTER — Other Ambulatory Visit: Payer: Self-pay | Admitting: Internal Medicine

## 2016-06-27 MED FILL — VERAPAMIL ER 240 MG CAPSULE: 240 | 90 days supply | Qty: 90 | Fill #0

## 2016-06-27 NOTE — Telephone Encounter (Signed)
Patient requesting refill on verapamil to be sent to [facility] outpatient pharmacy.

## 2016-06-28 ENCOUNTER — Encounter: Payer: Self-pay | Admitting: Internal Medicine

## 2016-06-28 ENCOUNTER — Encounter: Payer: Self-pay | Admitting: Gastroenterology

## 2016-06-28 ENCOUNTER — Ambulatory Visit (INDEPENDENT_AMBULATORY_CARE_PROVIDER_SITE_OTHER): Payer: 59 | Admitting: Internal Medicine

## 2016-06-28 VITALS — BP 148/78 | HR 74 | Temp 97.3°F | Wt 203.0 lb

## 2016-06-28 DIAGNOSIS — E113592 Type 2 diabetes mellitus with proliferative diabetic retinopathy without macular edema, left eye: Secondary | ICD-10-CM

## 2016-06-28 DIAGNOSIS — E1169 Type 2 diabetes mellitus with other specified complication: Secondary | ICD-10-CM

## 2016-06-28 DIAGNOSIS — E049 Nontoxic goiter, unspecified: Secondary | ICD-10-CM | POA: Diagnosis not present

## 2016-06-28 DIAGNOSIS — E785 Hyperlipidemia, unspecified: Secondary | ICD-10-CM

## 2016-06-28 LAB — POCT GLYCOSYLATED HEMOGLOBIN (HGB A1C): HEMOGLOBIN A1C: 6.2

## 2016-06-28 NOTE — Patient Instructions (Addendum)
Please continue: - Metformin 1000 mg 2x a day - Januvia 100 mg daily in am  Please come back for a follow-up appointment in 4 months.

## 2016-06-28 NOTE — Telephone Encounter (Signed)
Dr Alain Marion has already sent rx in according to med list

## 2016-06-28 NOTE — Progress Notes (Signed)
Patient ID: Katelyn Peterson, female   DOB: [birth year], 71 y.o.   MRN: [id number]  HPI: Katelyn Peterson is a 71 y.o.-year-old female, returning for f/u for DM2, dx 2000, non-insulin-dependent, controlled, with complications (mild PDR in [facility]). Last visit 4 mo ago.  Last hemoglobin A1c was: Lab Results  Component Value Date   HGBA1C 5.9 02/27/2016   HGBA1C 5.9 11/03/2015   HGBA1C 6.0 03/04/2015   Pt is on a regimen of: - Metformin 1000 mg po bid - Januvia 100 mg qd We stopped Amaryl 2 mg bid and Actos 30 mg daily for daily hypoglycemia events and possible SEs, respectively.  Pt checks her sugars 1-2x a day and they are still great: - am:  88-121 >> 80s-120s >> 80-129 >> 78-117, 121 >> 85-115, 122 >> 80-112, 116, 124 - 2h after b'fast: 89-130 >> 96 >> 108, 109 >> 123 >> n/c >> 131 >> n/c - before lunch: 93-140 >> n/c >> 114-120 >> 68-116 >> 84-109 >> 102 >> n/c - 2h after lunch:97-121, 138 >> 84-117, 137 >> 110, 124 >> 99-100 >> n/c >> 110 >> n/c - before dinner: 72-118 >> 84-98, 130 >> n/c >> 76-101 >> 70-110 >> 70 >> n/c - 2h after dinner: 138 >> n/c >> 125 >> n/c - bedtime:  100-120 >> n/c >> 120s >> 90 >> 70 >> n/c Lowest sugar was 68 >> 88 >> 70 >> 80;  she has hypoglycemia awareness at 80.  Highest sugar was 121 >> 129 >> 124.  - no CKD, last BUN/creatinine:  Lab Results  Component Value Date   BUN 17 11/03/2015   CREATININE 0.93 11/03/2015  She is on quinapril 40.  - last set of lipids: Lab Results  Component Value Date   CHOL 184 11/03/2015   HDL 58.50 11/03/2015   LDLCALC 111 (H) 11/03/2015   LDLDIRECT 146.5 09/18/2012   TRIG 73.0 11/03/2015   CHOLHDL 3 11/03/2015  She developed leg cramps with pravastatin and atorvastatin. She started Crestor >> now seldom.  - last eye exam was 04/2016. 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 Dr. Edyth Gunnels. - no numbness and tingling in her feet.  She also has a history of hypertension, hyperlipidemia, obesity. She has been considered for  lap band surgery >> she would not want to do this. She also has bilateral chronic lower extremity cramps, right knee pain, anemia.  ROS: Constitutional: no weight gain/loss, no fatigue, no subjective hyperthermia/hypothermia Eyes: no blurry vision, no xerophthalmia ENT: no sore throat, no nodules palpated in throat, no dysphagia/odynophagia, no hoarseness Cardiovascular: no CP/SOB/palpitations/leg swelling Respiratory: no cough/SOB Gastrointestinal: no N/V/D/C Musculoskeletal: no muscle/joint aches Skin: no rashes Neurological: no tremors/numbness/tingling/dizziness  I reviewed pt's medications, allergies, PMH, social hx, family hx, and changes were documented in the history of present illness. Otherwise, unchanged from my initial visit note.  PE: BP (!) 148/78 (BP Location: Left Arm, Patient Position: Sitting, Cuff Size: Normal)   Pulse 74   Temp 97.3 F (36.3 C) (Axillary)   Wt 203 lb (92.1 kg)   SpO2 98%   BMI 35.96 kg/m  Body mass index is 35.96 kg/m.  Wt Readings from Last 3 Encounters:  06/28/16 203 lb (92.1 kg)  05/08/16 205 lb (93 kg)  02/27/16 206 lb 3.2 oz (93.5 kg)   Constitutional: overweight, in NAD Eyes: PERRLA, EOMI, no exophthalmos ENT: moist mucous membranes, no thyromegaly, no cervical lymphadenopathy Cardiovascular: RRR, No MRG, + periankle swelling Respiratory: CTA B Gastrointestinal: abdomen soft, NT,  ND, BS+ Musculoskeletal: no deformities, strength intact in all 4 Skin: moist, warm, no rashes Neurological: no tremor with outstretched hands, DTR normal in all 4  ASSESSMENT: 1. 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 Hyperlipidemia - Developed leg cramps with pravastatin and atorvastatin - started Crestor - taking it seldom now  3. Goiter by palpation 11/04/2012: Thyroid U/S:  Right thyroid lobe: 4.3 x 1.0 x 1.8 cm   Left thyroid lobe: 4.6 x 1.2 x 1.9 cm   Isthmus: 5 mm   Focal nodules: Thyroid echotexture is  homogeneous, without solid or cystic lesion.   Lymphadenopathy: None visualized.  IMPRESSION:  Normal thyroid ultrasound. No evidence of thyromegaly or dominant solid/cystic mass.  PLAN:  1. DM2 Patient With long-standing, well controlled diabetes, on oral diabetic regimen, with all CBGs at goal. She does not have any lows. We'll continue the current regimen. - I suggested to:  Patient Instructions  Please continue: - Metformin 1000 mg twice a day - Januvia 100 mg daily in am  Please come back for a follow-up appointment in 4 months.  - I advised her to continue to check sugars every other day - Will check hemoglobin A1c >> 6.2% (slightly higher but still very good) - Return to clinic in 4 months with her sugar  2. Hyperlipidemia - Reviewed latest lipid panel from 10/2015 and the LDL has improved. - Continue Crestor 5 mg once every other week (because of cramps)  3. Non-nodular Goiter - No neck compression symptoms - Reviewed her latest thyroid ultrasound results - Continue to follow clinically.  Katelyn Kingdom, MD PhD [facility] Endocrin[facility]

## 2016-07-05 DIAGNOSIS — M1711 Unilateral primary osteoarthritis, right knee: Secondary | ICD-10-CM | POA: Diagnosis not present

## 2016-07-13 ENCOUNTER — Encounter: Payer: 59 | Admitting: Gastroenterology

## 2016-07-20 MED FILL — metFORMIN HCL 1000 MG TABS: 1000 | 90 days supply | Qty: 180 | Fill #1

## 2016-07-30 DIAGNOSIS — H524 Presbyopia: Secondary | ICD-10-CM | POA: Diagnosis not present

## 2016-07-30 DIAGNOSIS — H52203 Unspecified astigmatism, bilateral: Secondary | ICD-10-CM | POA: Diagnosis not present

## 2016-07-30 DIAGNOSIS — H2513 Age-related nuclear cataract, bilateral: Secondary | ICD-10-CM | POA: Diagnosis not present

## 2016-07-30 DIAGNOSIS — H25013 Cortical age-related cataract, bilateral: Secondary | ICD-10-CM | POA: Diagnosis not present

## 2016-07-30 DIAGNOSIS — H5203 Hypermetropia, bilateral: Secondary | ICD-10-CM | POA: Diagnosis not present

## 2016-07-30 DIAGNOSIS — E113293 Type 2 diabetes mellitus with mild nonproliferative diabetic retinopathy without macular edema, bilateral: Secondary | ICD-10-CM | POA: Diagnosis not present

## 2016-08-08 ENCOUNTER — Encounter: Payer: Self-pay | Admitting: Gastroenterology

## 2016-08-08 ENCOUNTER — Ambulatory Visit (AMBULATORY_SURGERY_CENTER): Payer: Self-pay

## 2016-08-08 VITALS — Ht 64.0 in | Wt 207.0 lb

## 2016-08-08 DIAGNOSIS — Z1211 Encounter for screening for malignant neoplasm of colon: Secondary | ICD-10-CM

## 2016-08-08 MED ORDER — NA SULFATE-K SULFATE-MG SULF 17.5-3.13-1.6 GM/177ML PO SOLN: 1.0000 | Freq: Once | ORAL | 0 refills | Status: AC

## 2016-08-08 NOTE — Progress Notes (Signed)
Denies allergies to eggs or soy products. Denies complication of anesthesia or sedation. Denies use of weight loss medication. Denies use of O2.   Emmi instructions declined.  

## 2016-08-16 MED FILL — SUPREP BOWEL PREP KIT: 17.5-3.13-1 | 2 days supply | Qty: 354 | Fill #0

## 2016-08-22 ENCOUNTER — Ambulatory Visit (AMBULATORY_SURGERY_CENTER): Payer: 59 | Admitting: Gastroenterology

## 2016-08-22 ENCOUNTER — Encounter: Payer: Self-pay | Admitting: Gastroenterology

## 2016-08-22 VITALS — BP 151/81 | HR 69 | Temp 97.3°F | Resp 15 | Ht 64.0 in | Wt 207.0 lb

## 2016-08-22 DIAGNOSIS — Z1211 Encounter for screening for malignant neoplasm of colon: Secondary | ICD-10-CM | POA: Diagnosis present

## 2016-08-22 DIAGNOSIS — K649 Unspecified hemorrhoids: Secondary | ICD-10-CM | POA: Diagnosis not present

## 2016-08-22 DIAGNOSIS — D125 Benign neoplasm of sigmoid colon: Secondary | ICD-10-CM

## 2016-08-22 DIAGNOSIS — K5730 Diverticulosis of large intestine without perforation or abscess without bleeding: Secondary | ICD-10-CM

## 2016-08-22 DIAGNOSIS — E669 Obesity, unspecified: Secondary | ICD-10-CM | POA: Diagnosis not present

## 2016-08-22 DIAGNOSIS — Z1212 Encounter for screening for malignant neoplasm of rectum: Secondary | ICD-10-CM | POA: Diagnosis not present

## 2016-08-22 DIAGNOSIS — K635 Polyp of colon: Secondary | ICD-10-CM

## 2016-08-22 DIAGNOSIS — I10 Essential (primary) hypertension: Secondary | ICD-10-CM | POA: Diagnosis not present

## 2016-08-22 DIAGNOSIS — E119 Type 2 diabetes mellitus without complications: Secondary | ICD-10-CM | POA: Diagnosis not present

## 2016-08-22 MED ORDER — SODIUM CHLORIDE 0.9 % IV SOLN: 500.0000 mL | INTRAVENOUS | Status: DC

## 2016-08-22 NOTE — Progress Notes (Signed)
Called to room to assist during endoscopic procedure.  Patient ID and intended procedure confirmed with present staff. Received instructions for my participation in the procedure from the performing physician.  

## 2016-08-22 NOTE — Patient Instructions (Signed)
Handouts:  Polyps, Diverticulosis, and Hemorrhoids  YOU HAD AN ENDOSCOPIC PROCEDURE TODAY AT THE [facility]:   Refer to the procedure report that was given to you for any specific questions about what was found during the examination.  If the procedure report does not answer your questions, please call your gastroenterologist to clarify.  If you requested that your care partner not be given the details of your procedure findings, then the procedure report has been included in a sealed envelope for you to review at your convenience later.  YOU SHOULD EXPECT: Some feelings of bloating in the abdomen. Passage of more gas than usual.  Walking can help get rid of the air that was put into your GI tract during the procedure and reduce the bloating. If you had a lower endoscopy (such as a colonoscopy or flexible sigmoidoscopy) you may notice spotting of blood in your stool or on the toilet paper. If you underwent a bowel prep for your procedure, you may not have a normal bowel movement for a few days.  Please Note:  You might notice some irritation and congestion in your nose or some drainage.  This is from the oxygen used during your procedure.  There is no need for concern and it should clear up in a day or so.  SYMPTOMS TO REPORT IMMEDIATELY:   Following lower endoscopy (colonoscopy or flexible sigmoidoscopy):  Excessive amounts of blood in the stool  Significant tenderness or worsening of abdominal pains  Swelling of the abdomen that is new, acute  Fever of 100F or higher  For urgent or emergent issues, a gastroenterologist can be reached at any hour by calling [phone].   DIET:  We do recommend a small meal at first, but then you may proceed to your regular diet.  Drink plenty of fluids but you should avoid alcoholic beverages for 24 hours.  ACTIVITY:  You should plan to take it easy for the rest of today and you should NOT DRIVE or use heavy machinery until tomorrow (because  of the sedation medicines used during the test).    FOLLOW UP: Our staff will call the number listed on your records the next business day following your procedure to check on you and address any questions or concerns that you may have regarding the information given to you following your procedure. If we do not reach you, we will leave a message.  However, if you are feeling well and you are not experiencing any problems, there is no need to return our call.  We will assume that you have returned to your regular daily activities without incident.  If any biopsies were taken you will be contacted by phone or by letter within the next 1-3 weeks.  Please call us at [phone] if you have not heard about the biopsies in 3 weeks.    SIGNATURES/CONFIDENTIALITY: You and/or your care partner have signed paperwork which will be entered into your electronic medical record.  These signatures attest to the fact that that the information above on your After Visit Summary has been reviewed and is understood.  Full responsibility of the confidentiality of this discharge information lies with you and/or your care-partner. 

## 2016-08-22 NOTE — Progress Notes (Signed)
To recovery, report to Tyrell, RN, VSS. 

## 2016-08-22 NOTE — Op Note (Signed)
[place] Patient Name: Katelyn Peterson Procedure Date: 08/22/2016 8:42 AM MRN: [id number] Endoscopist: Milus Banister , MD Age: 70 Referring MD:  Date of Birth: 1945-06-03 Gender: Female Account #: [id number] Procedure:                Colonoscopy Indications:              Screening for colorectal malignant neoplasm Medicines:                Monitored Anesthesia Care Procedure:                Pre-Anesthesia Assessment:                           - Prior to the procedure, a History and Physical                            was performed, and patient medications and                            allergies were reviewed. The patient's tolerance of                            previous anesthesia was also reviewed. The risks                            and benefits of the procedure and the sedation                            options and risks were discussed with the patient.                            All questions were answered, and informed consent                            was obtained. Prior Anticoagulants: The patient has                            taken no previous anticoagulant or antiplatelet                            agents. ASA Grade Assessment: II - A patient with                            mild systemic disease. After reviewing the risks                            and benefits, the patient was deemed in                            satisfactory condition to undergo the procedure.                           After obtaining informed consent, the colonoscope  was passed under direct vision. Throughout the                            procedure, the patient's blood pressure, pulse, and                            oxygen saturations were monitored continuously. The                            Colonoscope was introduced through the anus and                            advanced to the the cecum, identified by                            appendiceal orifice and  ileocecal valve. The                            colonoscopy was performed without difficulty. The                            patient tolerated the procedure well. The quality                            of the bowel preparation was excellent. The                            ileocecal valve, appendiceal orifice, and rectum                            were photographed. Scope In: 6:19:50 AM Scope Out: 8:55:33 AM Scope Withdrawal Time: 0 hours 7 minutes 13 seconds  Total Procedure Duration: 0 hours 9 minutes 20 seconds  Findings:                 Two sessile polyps were found in the sigmoid colon.                            The polyps were 2 to 3 mm in size. These polyps                            were removed with a cold snare. Resection and                            retrieval were complete.                           Multiple small and large-mouthed diverticula were                            found in the entire colon.                           Internal and external hemorrhoids present.  The exam was otherwise without abnormality on                            direct and retroflexion views. Complications:            No immediate complications. Estimated blood loss:                            None. Estimated Blood Loss:     Estimated blood loss: none. Impression:               - Two 2 to 3 mm polyps in the sigmoid colon,                            removed with a cold snare. Resected and retrieved.                           - Diverticulosis in the entire examined colon.                           - Hemorrhoids.                           - The examination was otherwise normal on direct                            and retroflexion views. Recommendation:           - Patient has a contact number available for                            emergencies. The signs and symptoms of potential                            delayed complications were discussed with the                             patient. Return to normal activities tomorrow.                            Written discharge instructions were provided to the                            patient.                           - Resume previous diet.                           - Continue present medications.                           You will receive a letter within 2-3 weeks with the                            pathology results and my final recommendations.  If the polyp(s) is proven to be 'pre-cancerous' on                            pathology, you will need repeat colonoscopy in 5                            years. If the polyp(s) is NOT 'precancerous' on                            pathology then you should repeat colon cancer                            screening in 10 years with colonoscopy without need                            for colon cancer screening by any method prior to                            then (including stool testing). Milus Banister, MD 08/22/2016 8:58:17 AM This report has been signed electronically.

## 2016-08-23 ENCOUNTER — Telehealth: Payer: Self-pay | Admitting: *Deleted

## 2016-08-23 NOTE — Telephone Encounter (Signed)
  Follow up Call-  Call back number 08/22/2016  Post procedure Call Back phone  # [phone]  Permission to leave phone message Yes  Some recent data might be hidden     Patient questions:  Message left to call us if necessary. Second call.

## 2016-08-23 NOTE — Telephone Encounter (Signed)
  Follow up Call-  Call back number 08/22/2016  Post procedure Call Back phone  # [phone]  Permission to leave phone message Yes  Some recent data might be hidden     Patient questions:  Message left to call us if necessary.

## 2016-08-28 ENCOUNTER — Encounter: Payer: Self-pay | Admitting: Gastroenterology

## 2016-09-06 ENCOUNTER — Other Ambulatory Visit: Payer: Self-pay | Admitting: Internal Medicine

## 2016-09-07 MED FILL — JANUVIA 100 MG TABLET: 100 | 30 days supply | Qty: 30 | Fill #0

## 2016-09-07 NOTE — Telephone Encounter (Signed)
Per chart refill has already been approved and sent to [facility]...Katelyn Peterson

## 2016-09-07 NOTE — Telephone Encounter (Signed)
Pt called checking on this refill. I told her that the request was sent to endo but she said that Dr Alain Marion is the one that normally fills it for her. She said that he asked about refills on this at her last visit but she did not need any at that time. She said that she is completely out. Please advise.

## 2016-09-07 NOTE — Telephone Encounter (Signed)
Pt see Dr. Cruzita Lederer for her diabetes forwarding request to endo...Katelyn Peterson

## 2016-09-10 ENCOUNTER — Other Ambulatory Visit: Payer: Self-pay | Admitting: Internal Medicine

## 2016-09-10 MED FILL — QUINAPRIL 40 MG TABLET: 40 | 90 days supply | Qty: 90 | Fill #0

## 2016-09-10 NOTE — Telephone Encounter (Signed)
Pt left msg on triage needing refill on her Quinapril. Inform pt refill has been sent to [facility] outpatient pharmacy!

## 2016-09-19 DIAGNOSIS — Z1231 Encounter for screening mammogram for malignant neoplasm of breast: Secondary | ICD-10-CM | POA: Diagnosis not present

## 2016-10-02 MED FILL — SPIRONOLACTONE 50 MG TAB: 50 | 90 days supply | Qty: 90 | Fill #1

## 2016-10-29 ENCOUNTER — Ambulatory Visit (INDEPENDENT_AMBULATORY_CARE_PROVIDER_SITE_OTHER): Payer: 59 | Admitting: Internal Medicine

## 2016-10-29 ENCOUNTER — Encounter: Payer: Self-pay | Admitting: Internal Medicine

## 2016-10-29 VITALS — BP 134/70 | HR 77 | Ht 64.0 in | Wt 207.0 lb

## 2016-10-29 DIAGNOSIS — E049 Nontoxic goiter, unspecified: Secondary | ICD-10-CM

## 2016-10-29 DIAGNOSIS — E1169 Type 2 diabetes mellitus with other specified complication: Secondary | ICD-10-CM

## 2016-10-29 DIAGNOSIS — E113592 Type 2 diabetes mellitus with proliferative diabetic retinopathy without macular edema, left eye: Secondary | ICD-10-CM | POA: Diagnosis not present

## 2016-10-29 DIAGNOSIS — E785 Hyperlipidemia, unspecified: Secondary | ICD-10-CM | POA: Diagnosis not present

## 2016-10-29 LAB — POCT GLYCOSYLATED HEMOGLOBIN (HGB A1C): HEMOGLOBIN A1C: 6

## 2016-10-29 NOTE — Patient Instructions (Addendum)
Please continue: - Metformin 1000 mg 2x a day - Januvia 100 mg daily in am  Please come back for a follow-up appointment in 6 months.

## 2016-10-29 NOTE — Addendum Note (Signed)
Addended by: Caprice Beaver T on: 10/29/2016 10:50 AM   Modules accepted: Orders

## 2016-10-29 NOTE — Progress Notes (Signed)
Patient ID: Katelyn Peterson, female   DOB: March 07, 1946, 71 y.o.   MRN: [id number]  HPI: Katelyn Peterson is a 71 y.o.-year-old female, returning for f/u for DM2, dx 2000, non-insulin-dependent, controlled, with complications (mild PDR in [facility]). Last visit 4 mo ago.  Last hemoglobin A1c was: Lab Results  Component Value Date   HGBA1C 6.2 06/28/2016   HGBA1C 5.9 02/27/2016   HGBA1C 5.9 11/03/2015   Pt is on a regimen of: - Metformin 1000 mg 2x a day - Januvia 100 mg qd We stopped Amaryl 2 mg bid and Actos 30 mg daily for daily hypoglycemia events and possible SEs, respectively.  Pt checks her sugars 1-2x a day: - am:  78-117, 121 >> 85-115, 122 >> 80-112, 116, 124 >> 87-114, 124 - 2h after b'fast: 96 >> 108, 109 >> 123 >> n/c >> 131 >> n/c - before lunch: 114-120 >> 68-116 >> 84-109 >> 102 >> n/c >> 93 - 2h after lunch:110, 124 >> 99-100 >> n/c >> 110 >> n/c>> 110, 121 - before dinner:n/c >> 76-101 >> 70-110 >> 70 >> n/c >> 105, 115 - 2h after dinner: 138 >> n/c >> 125 >> n/c>> 135, 135 - bedtime:  100-120 >> n/c >> 120s >> 90 >> 70 >> n/c Lowest sugar was 80 >> 87;  she has hypoglycemia awareness at 80.  Highest sugar was 124 >> 124.  - No CKD, last BUN/creatinine:  Lab Results  Component Value Date   BUN 17 11/03/2015   CREATININE 0.93 11/03/2015  She is on Quinapril 40.  Last ACR: Lab Results  Component Value Date   MICRALBCREAT 0.9 05/08/2016   - last set of lipids: Lab Results  Component Value Date   CHOL 184 11/03/2015   HDL 58.50 11/03/2015   LDLCALC 111 (H) 11/03/2015   LDLDIRECT 146.5 09/18/2012   TRIG 73.0 11/03/2015   CHOLHDL 3 11/03/2015  She developed leg cramps with pravastatin and atorvastatin. She started Crestor >> now seldom..  - last eye exam was 04/2016. + mild PDR in [facility], stable. Dr. Edyth Gunnels. - she denies numbness and tingling in her feet.  She also has a history of hypertension, hyperlipidemia, obesity. She has been considered for lap band  surgery >> she would not want to do this. She also has bilateral chronic lower extremity cramps, right knee pain, anemia.  ROS: Constitutional: no weight gain/no weight loss, no fatigue, no subjective hyperthermia, no subjective hypothermia Eyes: no blurry vision, no xerophthalmia ENT: no sore throat, no nodules palpated in throat, no dysphagia, no odynophagia, no hoarseness Cardiovascular: no CP/no SOB/no palpitations/no leg swelling Respiratory: no cough/no SOB/no wheezing Gastrointestinal: no N/no V/no D/no C/no acid reflux Musculoskeletal: no muscle aches/no joint aches Skin: no rashes, no hair loss Neurological: no tremors/no numbness/no tingling/no dizziness  I reviewed pt's medications, allergies, PMH, social hx, family hx, and changes were documented in the history of present illness. Otherwise, unchanged from my initial visit note.   PE: BP 134/70 (BP Location: Left Arm, Patient Position: Sitting)   Pulse 77   Ht 5\' 4"  (1.626 m)   Wt 207 lb (93.9 kg)   SpO2 97%   BMI 35.53 kg/m  Body mass index is 35.53 kg/m.  Wt Readings from Last 3 Encounters:  10/29/16 207 lb (93.9 kg)  08/22/16 207 lb (93.9 kg)  08/08/16 207 lb (93.9 kg)   Constitutional: overweight, in NAD Eyes: PERRLA, EOMI, no exophthalmos ENT: moist mucous membranes, no thyromegaly, no cervical lymphadenopathy Cardiovascular:  RRR, No MRG Respiratory: CTA B Gastrointestinal: abdomen soft, NT, ND, BS+ Musculoskeletal: no deformities, strength intact in all 4 Skin: moist, warm, no rashes Neurological: no tremor with outstretched hands, DTR normal in all 4  ASSESSMENT: 1. 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 Hyperlipidemia - Developed leg cramps with pravastatin and atorvastatin - started Crestor - taking it seldom now  3. Goiter by palpation 11/04/2012: Thyroid U/S:  Right thyroid lobe: 4.3 x 1.0 x 1.8 cm   Left thyroid lobe: 4.6 x 1.2 x 1.9 cm   Isthmus: 5 mm    Focal nodules: Thyroid echotexture is homogeneous, without solid or cystic lesion.   Lymphadenopathy: None visualized.  IMPRESSION:  Normal thyroid ultrasound. No evidence of thyromegaly or dominant solid/cystic mass.  PLAN:  1. DM2 Patient with long standing well controlled DM, on oral DM regimen, with great control. No hyperglycemic spikes or hypoglycemic episodes. - I suggested to:  Patient Instructions  Please continue: - Metformin 1000 mg 2x a day - Januvia 100 mg daily in am  Please come back for a follow-up appointment in 4 months.  - today, HbA1c is 6.0% (better) - continue checking sugars at different times of the day - check 1x a day, rotating checks - advised for yearly eye exams >> she is UTD - Return to clinic in 3 mo with sugar log    2. Hyperlipidemia - Reviewed latest LDL from 10/2015 >> improved. - Continue Crestor 5 mg once - but she takes it sporadically (because of cramps)  3. Non-nodular Goiter - denies neck compression symptoms - reviewed her latest thyroid U/S report - Continue to follow clinically  Philemon Kingdom, MD PhD [facility]

## 2016-11-05 ENCOUNTER — Ambulatory Visit (INDEPENDENT_AMBULATORY_CARE_PROVIDER_SITE_OTHER): Payer: 59 | Admitting: Internal Medicine

## 2016-11-05 ENCOUNTER — Encounter: Payer: 59 | Admitting: Internal Medicine

## 2016-11-05 ENCOUNTER — Encounter: Payer: Self-pay | Admitting: Internal Medicine

## 2016-11-05 VITALS — BP 142/84 | HR 67 | Temp 98.2°F | Ht 64.0 in | Wt 206.0 lb

## 2016-11-05 DIAGNOSIS — I10 Essential (primary) hypertension: Secondary | ICD-10-CM | POA: Diagnosis not present

## 2016-11-05 DIAGNOSIS — Z6835 Body mass index (BMI) 35.0-35.9, adult: Secondary | ICD-10-CM

## 2016-11-05 DIAGNOSIS — Z0000 Encounter for general adult medical examination without abnormal findings: Secondary | ICD-10-CM

## 2016-11-05 DIAGNOSIS — E1169 Type 2 diabetes mellitus with other specified complication: Secondary | ICD-10-CM

## 2016-11-05 DIAGNOSIS — E785 Hyperlipidemia, unspecified: Secondary | ICD-10-CM | POA: Diagnosis not present

## 2016-11-05 DIAGNOSIS — E113592 Type 2 diabetes mellitus with proliferative diabetic retinopathy without macular edema, left eye: Secondary | ICD-10-CM

## 2016-11-05 MED FILL — metFORMIN HCL 1000 MG TABS: 1000 | 90 days supply | Qty: 180 | Fill #2

## 2016-11-05 MED FILL — VERAPAMIL ER 240 MG CAPSULE: 240 | 90 days supply | Qty: 90 | Fill #1

## 2016-11-05 NOTE — Assessment & Plan Note (Signed)
Wt Readings from Last 3 Encounters:  11/05/16 206 lb (93.4 kg)  10/29/16 207 lb (93.9 kg)  08/22/16 207 lb (93.9 kg)

## 2016-11-05 NOTE — Assessment & Plan Note (Addendum)
BP nl at work/home Spironolactone, quinapril, Verapamil

## 2016-11-05 NOTE — Progress Notes (Signed)
Subjective:  Patient ID: Katelyn Peterson, female    DOB: Jun 11, 1945  Age: 71 y.o. MRN: [id number]  CC: No chief complaint on file.   HPI Katelyn Peterson presents for HTN, DM, dyslipidemia f/u  Outpatient Medications Prior to Visit  Medication Sig Dispense Refill  . cetirizine (ZYRTEC) 10 MG tablet TAKE ONE TABLET BY MOUTH ONCE DAILY 70 tablet 5  . Cholecalciferol 1000 UNITS tablet Take 1,000 Units by mouth daily.      . Diclofenac Sodium 2 % SOLN Apply 1 pump twice daily. 112 g 3  . ibuprofen (ADVIL,MOTRIN) 600 MG tablet TAKE 1 TABLET BY MOUTH TWICE DAILY AFTER MEALS FOR 1 WEEK, THEN AS NEEDED FOR PAIN 60 tablet 2  . loratadine (CLARITIN) 10 MG tablet Take 1 tablet (10 mg total) by mouth daily. 90 tablet 0  . Menthol, Topical Analgesic, (BIOFREEZE) 4 % GEL Apply to affected area daily as needed 150 mL 0  . metFORMIN (GLUCOPHAGE) 1000 MG tablet TAKE 1 TABLET BY MOUTH TWICE A DAY WITH MEALS 180 tablet 3  . OVER THE COUNTER MEDICATION Goody Powder for headaches. PRN    . quinapril (ACCUPRIL) 40 MG tablet Take 1 tablet (40 mg total) by mouth daily. 90 tablet 0  . rosuvastatin (CRESTOR) 5 MG tablet Take 1 tablet (5 mg total) by mouth daily. 90 tablet 3  . sitaGLIPtin (JANUVIA) 100 MG tablet Take 1 tablet (100 mg total) by mouth daily. ( In the AM) 90 tablet 3  . spironolactone (ALDACTONE) 50 MG tablet TAKE 1 TABLET BY MOUTH DAILY 90 tablet 3  . traMADol (ULTRAM) 50 MG tablet Take 1-2 tablets (50-100 mg total) by mouth 2 (two) times daily as needed. 100 tablet 1  . verapamil (VERELAN PM) 240 MG 24 hr capsule TAKE 1 CAPSULE BY MOUTH AT BEDTIME. 90 capsule 2   Facility-Administered Medications Prior to Visit  Medication Dose Route Frequency Provider Last Rate Last Dose  . 0.9 %  sodium chloride infusion  500 mL Intravenous Continuous Milus Banister, MD        ROS Review of Systems  Constitutional: Negative for activity change, appetite change, chills, fatigue and unexpected weight  change.  HENT: Negative for congestion, mouth sores and sinus pressure.   Eyes: Negative for visual disturbance.  Respiratory: Negative for cough and chest tightness.   Gastrointestinal: Negative for abdominal pain and nausea.  Genitourinary: Negative for difficulty urinating, frequency and vaginal pain.  Musculoskeletal: Positive for arthralgias and back pain. Negative for gait problem.  Skin: Negative for pallor and rash.  Neurological: Negative for dizziness, tremors, weakness, numbness and headaches.  Psychiatric/Behavioral: Negative for confusion and sleep disturbance.    Objective:  BP (!) 142/84 (BP Location: Left Arm, Patient Position: Sitting, Cuff Size: Large)   Pulse 67   Temp 98.2 F (36.8 C) (Oral)   Ht 5\' 4"  (1.626 m)   Wt 206 lb (93.4 kg)   SpO2 98%   BMI 35.36 kg/m   BP Readings from Last 3 Encounters:  11/05/16 (!) 142/84  10/29/16 134/70  08/22/16 (!) 151/81    Wt Readings from Last 3 Encounters:  11/05/16 206 lb (93.4 kg)  10/29/16 207 lb (93.9 kg)  08/22/16 207 lb (93.9 kg)    Physical Exam  Constitutional: She appears well-developed. No distress.  HENT:  Head: Normocephalic.  Right Ear: External ear normal.  Left Ear: External ear normal.  Nose: Nose normal.  Mouth/Throat: Oropharynx is clear and moist.  Eyes: Pupils  are equal, round, and reactive to light. Conjunctivae are normal. Right eye exhibits no discharge. Left eye exhibits no discharge.  Neck: Normal range of motion. Neck supple. No JVD present. No tracheal deviation present. No thyromegaly present.  Cardiovascular: Normal rate, regular rhythm and normal heart sounds.   Pulmonary/Chest: No stridor. No respiratory distress. She has no wheezes.  Abdominal: Soft. Bowel sounds are normal. She exhibits no distension and no mass. There is no tenderness. There is no rebound and no guarding.  Musculoskeletal: She exhibits no edema or tenderness.  Lymphadenopathy:    She has no cervical  adenopathy.  Neurological: She displays normal reflexes. No cranial nerve deficit. She exhibits normal muscle tone. Coordination normal.  Skin: No rash noted. No erythema.  Psychiatric: She has a normal mood and affect. Her behavior is normal. Judgment and thought content normal.    Lab Results  Component Value Date   WBC 5.8 05/08/2016   HGB 10.6 (L) 05/08/2016   HCT 31.8 (L) 05/08/2016   PLT 264.0 05/08/2016   GLUCOSE 97 11/03/2015   CHOL 184 11/03/2015   TRIG 73.0 11/03/2015   HDL 58.50 11/03/2015   LDLDIRECT 146.5 09/18/2012   LDLCALC 111 (H) 11/03/2015   ALT 10 11/03/2015   AST 9 11/03/2015   NA 140 11/03/2015   K 5.0 11/03/2015   CL 110 11/03/2015   CREATININE 0.93 11/03/2015   BUN 17 11/03/2015   CO2 25 11/03/2015   TSH 1.47 11/03/2015   HGBA1C 6.0 10/29/2016   MICROALBUR 2.0 (H) 05/08/2016    Dg Ankle Complete Left  Result Date: 04/01/2015 CLINICAL DATA:  Anterior left ankle pain for 6 months. No known injury. History of bone spur. EXAM: LEFT ANKLE COMPLETE - 3+ VIEW COMPARISON:  Radiographs 08/22/2011. FINDINGS: The bones are demineralized. There is no evidence of acute fracture or dislocation. Spurring of the medial malleolus and posterior aspect of the calcaneal tuberosity is unchanged. The inferior aspect of the calcaneus is not imaged. Moderate midfoot degenerative changes are noted. The soft tissue surrounding the ankle are diffusely prominent as before. There are scattered vascular calcifications. IMPRESSION: Stable left ankle radiographs. Degenerative changes as described without acute findings. Electronically Signed   By: Richardean Sale M.D.   On: 04/01/2015 13:36   [place] Extrem Low Left Ltd  Result Date: 04/04/2015 [facility] US performed of: *Left ankle This study was ordered, performed, and interpreted by Charlann Boxer D.O. Foot/Ankle:  All structures visualized.  Talar dome has anterior spurring. Large anterior spur noted right near the anterior tibialis tendon.  Hypoechoic changes and increasing Doppler flow Ankle mortise with trace effusion Peroneus longus and brevis tendons unremarkable on long and transverse views without sheath effusions. Posterior tibialis, flexor hallucis longus, and flexor digitorum longus tendons unremarkable on long and transverse views without sheath effusions. Achilles tendon visualized along length of tendon and unremarkable on long and transverse views without sheath effusion. Anterior Talofibular Ligament and Calcaneofibular Ligaments unremarkable and intact. Deltoid Ligament unremarkable and intact. Plantar fascia intact and without effusion, normal thickness. No increased doppler signal, cap sign, or thickening of tibial cortex. Power doppler signal normal.   Moderate ankle arthritis with small effusion and spurring of the anterior aspect causing anterior tibialis tendinitis    Assessment & Plan:   There are no diagnoses linked to this encounter. I am having Ms. Sesler maintain her Cholecalciferol, loratadine, Menthol (Topical Analgesic), cetirizine, rosuvastatin, Diclofenac Sodium, traMADol, ibuprofen, metFORMIN, spironolactone, verapamil, OVER THE COUNTER MEDICATION, sitaGLIPtin, and quinapril. We will  continue to administer sodium chloride.  No orders of the defined types were placed in this encounter.    Follow-up: No Follow-up on file.  Walker Kehr, MD

## 2016-11-05 NOTE — Assessment & Plan Note (Signed)
Metformin, Januvia, Crestor

## 2016-11-05 NOTE — Assessment & Plan Note (Signed)
-   Crestor 

## 2016-12-27 ENCOUNTER — Other Ambulatory Visit: Payer: Self-pay | Admitting: Internal Medicine

## 2016-12-27 DIAGNOSIS — M1711 Unilateral primary osteoarthritis, right knee: Secondary | ICD-10-CM | POA: Diagnosis not present

## 2016-12-27 MED FILL — DICLOFENAC SODIUM 1% GEL: 1 | 84 days supply | Qty: 500 | Fill #0

## 2016-12-28 MED FILL — QUINAPRIL 40 MG TABLET: 40 | 90 days supply | Qty: 90 | Fill #0

## 2017-01-03 MED FILL — JANUVIA 100 MG TABLET: 100 | 30 days supply | Qty: 30 | Fill #1

## 2017-01-07 ENCOUNTER — Encounter: Payer: Self-pay | Admitting: Internal Medicine

## 2017-01-07 ENCOUNTER — Other Ambulatory Visit (INDEPENDENT_AMBULATORY_CARE_PROVIDER_SITE_OTHER): Payer: 59

## 2017-01-07 ENCOUNTER — Ambulatory Visit (INDEPENDENT_AMBULATORY_CARE_PROVIDER_SITE_OTHER): Payer: 59 | Admitting: Internal Medicine

## 2017-01-07 VITALS — BP 140/80 | HR 60 | Temp 97.6°F | Ht 64.0 in | Wt 206.0 lb

## 2017-01-07 DIAGNOSIS — Z0000 Encounter for general adult medical examination without abnormal findings: Secondary | ICD-10-CM

## 2017-01-07 DIAGNOSIS — E785 Hyperlipidemia, unspecified: Secondary | ICD-10-CM

## 2017-01-07 DIAGNOSIS — E1169 Type 2 diabetes mellitus with other specified complication: Secondary | ICD-10-CM

## 2017-01-07 DIAGNOSIS — E113592 Type 2 diabetes mellitus with proliferative diabetic retinopathy without macular edema, left eye: Secondary | ICD-10-CM

## 2017-01-07 LAB — LIPID PANEL
Cholesterol: 208 mg/dL — ABNORMAL HIGH (ref 0–200)
HDL: 56 mg/dL (ref >39.00)
LDL Cholesterol: 134 mg/dL — ABNORMAL HIGH (ref 0–99)
NONHDL: 151.52
Total CHOL/HDL Ratio: 4
Triglycerides: 90 mg/dL (ref 0.0–149.0)
VLDL: 18 mg/dL (ref 0.0–40.0)

## 2017-01-07 LAB — URINALYSIS
BILIRUBIN URINE: NEGATIVE
Hgb urine dipstick: NEGATIVE
KETONES UR: NEGATIVE
LEUKOCYTES UA: NEGATIVE
NITRITE: NEGATIVE
PH: 5.5 (ref 5.0–8.0)
Specific Gravity, Urine: <=1.005 — AB (ref 1.000–1.030)
TOTAL PROTEIN, URINE-UPE24: NEGATIVE
UROBILINOGEN UA: 0.2 (ref 0.0–1.0)
Urine Glucose: NEGATIVE

## 2017-01-07 LAB — HEPATIC FUNCTION PANEL
ALK PHOS: 86 U/L (ref 39–117)
ALT: 9 U/L (ref 0–35)
AST: 10 U/L (ref 0–37)
Albumin: 4 g/dL (ref 3.5–5.2)
BILIRUBIN DIRECT: 0.1 mg/dL (ref 0.0–0.3)
BILIRUBIN TOTAL: 0.3 mg/dL (ref 0.2–1.2)
TOTAL PROTEIN: 7.4 g/dL (ref 6.0–8.3)

## 2017-01-07 LAB — BASIC METABOLIC PANEL
BUN: 21 mg/dL (ref 6–23)
CHLORIDE: 107 meq/L (ref 96–112)
CO2: 25 mEq/L (ref 19–32)
CREATININE: 1.06 mg/dL (ref 0.40–1.20)
Calcium: 9.2 mg/dL (ref 8.4–10.5)
GFR: 65.75 mL/min (ref >60.00)
Glucose, Bld: 123 mg/dL — ABNORMAL HIGH (ref 70–99)
POTASSIUM: 4.6 meq/L (ref 3.5–5.1)
Sodium: 140 mEq/L (ref 135–145)

## 2017-01-07 LAB — CBC WITH DIFFERENTIAL/PLATELET
BASOS ABS: 0.1 10*3/uL (ref 0.0–0.1)
BASOS PCT: 1 % (ref 0.0–3.0)
EOS ABS: 0.1 10*3/uL (ref 0.0–0.7)
Eosinophils Relative: 1.3 % (ref 0.0–5.0)
HCT: 34 % — ABNORMAL LOW (ref 36.0–46.0)
Hemoglobin: 11.3 g/dL — ABNORMAL LOW (ref 12.0–15.0)
LYMPHS ABS: 2.3 10*3/uL (ref 0.7–4.0)
Lymphocytes Relative: 34.2 % (ref 12.0–46.0)
MCHC: 33.1 g/dL (ref 30.0–36.0)
MCV: 90 fl (ref 78.0–100.0)
MONOS PCT: 7.3 % (ref 3.0–12.0)
Monocytes Absolute: 0.5 10*3/uL (ref 0.1–1.0)
NEUTROS ABS: 3.7 10*3/uL (ref 1.4–7.7)
NEUTROS PCT: 56.2 % (ref 43.0–77.0)
PLATELETS: 274 10*3/uL (ref 150.0–400.0)
RBC: 3.78 Mil/uL — ABNORMAL LOW (ref 3.87–5.11)
RDW: 14.1 % (ref 11.5–15.5)
WBC: 6.6 10*3/uL (ref 4.0–10.5)

## 2017-01-07 LAB — MICROALBUMIN / CREATININE URINE RATIO
Creatinine,U: 59.9 mg/dL
MICROALB UR: 0.9 mg/dL (ref 0.0–1.9)
Microalb Creat Ratio: 1.5 mg/g (ref 0.0–30.0)

## 2017-01-07 LAB — HEMOGLOBIN A1C: Hgb A1c MFr Bld: 6.5 % (ref 4.6–6.5)

## 2017-01-07 LAB — TSH: TSH: 1.7 u[IU]/mL (ref 0.35–4.50)

## 2017-01-07 MED ORDER — MELOXICAM 7.5 MG PO TABS: 7.5000 mg | ORAL_TABLET | Freq: Every day | ORAL | 1 refills | Status: DC | PRN

## 2017-01-07 MED FILL — MELOXICAM 7.5 MG TABLET: 7.5 | 30 days supply | Qty: 30 | Fill #0

## 2017-01-07 NOTE — Progress Notes (Signed)
Subjective:  Patient ID: Katelyn Peterson, female    DOB: 04/14/1945  Age: 71 y.o. MRN: [id number]  CC: No chief complaint on file.   HPI Katelyn Peterson presents for a well exam  Outpatient Medications Prior to Visit  Medication Sig Dispense Refill  . cetirizine (ZYRTEC) 10 MG tablet TAKE ONE TABLET BY MOUTH ONCE DAILY 70 tablet 5  . Cholecalciferol 1000 UNITS tablet Take 1,000 Units by mouth daily.      . Diclofenac Sodium 2 % SOLN Apply 1 pump twice daily. 112 g 3  . ibuprofen (ADVIL,MOTRIN) 600 MG tablet TAKE 1 TABLET BY MOUTH TWICE DAILY AFTER MEALS FOR 1 WEEK, THEN AS NEEDED FOR PAIN 60 tablet 2  . loratadine (CLARITIN) 10 MG tablet Take 1 tablet (10 mg total) by mouth daily. 90 tablet 0  . Menthol, Topical Analgesic, (BIOFREEZE) 4 % GEL Apply to affected area daily as needed 150 mL 0  . metFORMIN (GLUCOPHAGE) 1000 MG tablet TAKE 1 TABLET BY MOUTH TWICE A DAY WITH MEALS 180 tablet 3  . OVER THE COUNTER MEDICATION Goody Powder for headaches. PRN    . quinapril (ACCUPRIL) 40 MG tablet TAKE 1 TABLET (40 MG TOTAL) BY MOUTH DAILY. 90 tablet 0  . rosuvastatin (CRESTOR) 5 MG tablet Take 1 tablet (5 mg total) by mouth daily. 90 tablet 3  . sitaGLIPtin (JANUVIA) 100 MG tablet Take 1 tablet (100 mg total) by mouth daily. ( In the AM) 90 tablet 3  . spironolactone (ALDACTONE) 50 MG tablet TAKE 1 TABLET BY MOUTH DAILY 90 tablet 3  . traMADol (ULTRAM) 50 MG tablet Take 1-2 tablets (50-100 mg total) by mouth 2 (two) times daily as needed. 100 tablet 1  . verapamil (VERELAN PM) 240 MG 24 hr capsule TAKE 1 CAPSULE BY MOUTH AT BEDTIME. 90 capsule 2   Facility-Administered Medications Prior to Visit  Medication Dose Route Frequency Provider Last Rate Last Dose  . 0.9 %  sodium chloride infusion  500 mL Intravenous Continuous Milus Banister, MD        ROS Review of Systems  Constitutional: Negative for activity change, appetite change, chills, fatigue and unexpected weight change.  HENT:  Negative for congestion, mouth sores and sinus pressure.   Eyes: Negative for visual disturbance.  Respiratory: Negative for cough and chest tightness.   Gastrointestinal: Negative for abdominal pain and nausea.  Genitourinary: Negative for difficulty urinating, frequency and vaginal pain.  Musculoskeletal: Negative for back pain and gait problem.  Skin: Negative for pallor and rash.  Neurological: Negative for dizziness, tremors, weakness, numbness and headaches.  Psychiatric/Behavioral: Negative for confusion and sleep disturbance.    Objective:  BP 140/80 (BP Location: Left Arm, Patient Position: Sitting, Cuff Size: Large)   Pulse 60   Temp 97.6 F (36.4 C) (Oral)   Ht 5\' 4"  (1.626 m)   Wt 206 lb (93.4 kg)   SpO2 99%   BMI 35.36 kg/m   BP Readings from Last 3 Encounters:  01/07/17 140/80  11/05/16 (!) 142/84  10/29/16 134/70    Wt Readings from Last 3 Encounters:  01/07/17 206 lb (93.4 kg)  11/05/16 206 lb (93.4 kg)  10/29/16 207 lb (93.9 kg)    Physical Exam  Constitutional: She appears well-developed. No distress.  HENT:  Head: Normocephalic.  Right Ear: External ear normal.  Left Ear: External ear normal.  Nose: Nose normal.  Mouth/Throat: Oropharynx is clear and moist.  Eyes: Pupils are equal, round, and reactive to  light. Conjunctivae are normal. Right eye exhibits no discharge. Left eye exhibits no discharge.  Neck: Normal range of motion. Neck supple. No JVD present. No tracheal deviation present. No thyromegaly present.  Cardiovascular: Normal rate, regular rhythm and normal heart sounds.   Pulmonary/Chest: No stridor. No respiratory distress. She has no wheezes.  Abdominal: Soft. Bowel sounds are normal. She exhibits no distension and no mass. There is no tenderness. There is no rebound and no guarding.  Musculoskeletal: She exhibits no edema or tenderness.  Lymphadenopathy:    She has no cervical adenopathy.  Neurological: She displays normal reflexes.  No cranial nerve deficit. She exhibits normal muscle tone. Coordination normal.  Skin: No rash noted. No erythema.  Psychiatric: She has a normal mood and affect. Her behavior is normal. Judgment and thought content normal.    Lab Results  Component Value Date   WBC 5.8 05/08/2016   HGB 10.6 (L) 05/08/2016   HCT 31.8 (L) 05/08/2016   PLT 264.0 05/08/2016   GLUCOSE 97 11/03/2015   CHOL 184 11/03/2015   TRIG 73.0 11/03/2015   HDL 58.50 11/03/2015   LDLDIRECT 146.5 09/18/2012   LDLCALC 111 (H) 11/03/2015   ALT 10 11/03/2015   AST 9 11/03/2015   NA 140 11/03/2015   K 5.0 11/03/2015   CL 110 11/03/2015   CREATININE 0.93 11/03/2015   BUN 17 11/03/2015   CO2 25 11/03/2015   TSH 1.47 11/03/2015   HGBA1C 6.0 10/29/2016   MICROALBUR 2.0 (H) 05/08/2016    Dg Ankle Complete Left  Result Date: 04/01/2015 CLINICAL DATA:  Anterior left ankle pain for 6 months. No known injury. History of bone spur. EXAM: LEFT ANKLE COMPLETE - 3+ VIEW COMPARISON:  Radiographs 08/22/2011. FINDINGS: The bones are demineralized. There is no evidence of acute fracture or dislocation. Spurring of the medial malleolus and posterior aspect of the calcaneal tuberosity is unchanged. The inferior aspect of the calcaneus is not imaged. Moderate midfoot degenerative changes are noted. The soft tissue surrounding the ankle are diffusely prominent as before. There are scattered vascular calcifications. IMPRESSION: Stable left ankle radiographs. Degenerative changes as described without acute findings. Electronically Signed   By: Richardean Sale M.D.   On: 04/01/2015 13:36   [place] Extrem Low Left Ltd  Result Date: 04/04/2015 [facility] US performed of: *Left ankle This study was ordered, performed, and interpreted by Charlann Boxer D.O. Foot/Ankle:  All structures visualized.  Talar dome has anterior spurring. Large anterior spur noted right near the anterior tibialis tendon. Hypoechoic changes and increasing Doppler flow Ankle mortise  with trace effusion Peroneus longus and brevis tendons unremarkable on long and transverse views without sheath effusions. Posterior tibialis, flexor hallucis longus, and flexor digitorum longus tendons unremarkable on long and transverse views without sheath effusions. Achilles tendon visualized along length of tendon and unremarkable on long and transverse views without sheath effusion. Anterior Talofibular Ligament and Calcaneofibular Ligaments unremarkable and intact. Deltoid Ligament unremarkable and intact. Plantar fascia intact and without effusion, normal thickness. No increased doppler signal, cap sign, or thickening of tibial cortex. Power doppler signal normal.   Moderate ankle arthritis with small effusion and spurring of the anterior aspect causing anterior tibialis tendinitis    Assessment & Plan:   There are no diagnoses linked to this encounter. I am having Ms. Krage maintain her Cholecalciferol, loratadine, Menthol (Topical Analgesic), cetirizine, rosuvastatin, Diclofenac Sodium, traMADol, ibuprofen, metFORMIN, spironolactone, verapamil, OVER THE COUNTER MEDICATION, sitaGLIPtin, and quinapril. We will continue to administer sodium chloride.  No orders of the defined types were placed in this encounter.    Follow-up: No Follow-up on file.  Walker Kehr, MD

## 2017-01-07 NOTE — Assessment & Plan Note (Signed)
Labs On Meds

## 2017-01-07 NOTE — Assessment & Plan Note (Signed)
We discussed age appropriate health related issues, including available/recomended screening tests and vaccinations. We discussed a need for adhering to healthy diet and exercise. Labs were ordered to be later reviewed . All questions were answered.   

## 2017-01-07 NOTE — Assessment & Plan Note (Signed)
Crestor Compliance discussed. Occ cramps

## 2017-01-17 ENCOUNTER — Other Ambulatory Visit: Payer: Self-pay

## 2017-01-17 MED ORDER — SPIRONOLACTONE 50 MG PO TABS: 50.0000 mg | ORAL_TABLET | Freq: Every day | ORAL | 3 refills | Status: DC

## 2017-01-17 MED FILL — SPIRONOLACTONE 50 MG TABLET: 50 | 90 days supply | Qty: 90 | Fill #0

## 2017-03-22 ENCOUNTER — Other Ambulatory Visit: Payer: Self-pay | Admitting: Family

## 2017-03-22 MED ORDER — PREDNISONE 20 MG PO TABS: 20.0000 mg | ORAL_TABLET | Freq: Every day | ORAL | 0 refills | Status: DC

## 2017-03-22 NOTE — Progress Notes (Signed)
Back pain. watc glucose levels

## 2017-03-23 ENCOUNTER — Other Ambulatory Visit: Payer: Self-pay | Admitting: Family

## 2017-03-23 MED ORDER — PREDNISONE 20 MG PO TABS: 20.0000 mg | ORAL_TABLET | Freq: Every day | ORAL | 0 refills | Status: DC

## 2017-03-25 MED FILL — JANUVIA 100 MG TABLET: 100 | 60 days supply | Qty: 60 | Fill #2

## 2017-04-23 ENCOUNTER — Ambulatory Visit: Payer: 59 | Admitting: Internal Medicine

## 2017-04-23 ENCOUNTER — Encounter: Payer: Self-pay | Admitting: Internal Medicine

## 2017-04-23 VITALS — BP 138/78 | HR 74 | Ht 64.0 in | Wt 203.4 lb

## 2017-04-23 DIAGNOSIS — E785 Hyperlipidemia, unspecified: Secondary | ICD-10-CM | POA: Diagnosis not present

## 2017-04-23 DIAGNOSIS — E049 Nontoxic goiter, unspecified: Secondary | ICD-10-CM

## 2017-04-23 DIAGNOSIS — E113592 Type 2 diabetes mellitus with proliferative diabetic retinopathy without macular edema, left eye: Secondary | ICD-10-CM

## 2017-04-23 DIAGNOSIS — E1169 Type 2 diabetes mellitus with other specified complication: Secondary | ICD-10-CM | POA: Diagnosis not present

## 2017-04-23 LAB — POCT GLYCOSYLATED HEMOGLOBIN (HGB A1C): Hemoglobin A1C: 6.3

## 2017-04-23 MED ORDER — METFORMIN HCL 1000 MG PO TABS: 1000.0000 mg | ORAL_TABLET | Freq: Two times a day (BID) | ORAL | 3 refills | Status: DC

## 2017-04-23 MED ORDER — SITAGLIPTIN PHOSPHATE 100 MG PO TABS: 100.0000 mg | ORAL_TABLET | Freq: Every day | ORAL | 3 refills | Status: DC

## 2017-04-23 MED ORDER — FLUVASTATIN SODIUM ER 80 MG PO TB24: 80.0000 mg | ORAL_TABLET | Freq: Every day | ORAL | 11 refills | Status: DC

## 2017-04-23 MED FILL — metFORMIN HCL 1000 MG TABS: 1000 | 90 days supply | Qty: 180 | Fill #0

## 2017-04-23 NOTE — Patient Instructions (Addendum)
Please continue: - Metformin 1000 mg 2x a day - Januvia 100 mg daily in am  Please stop Crestor and start Fluvastatin XL 80 mg daily.  Please come back for a follow-up appointment in 6 months.

## 2017-04-23 NOTE — Progress Notes (Signed)
Patient ID: Katelyn Peterson, female   DOB: 1945/11/27, 72 y.o.   MRN: [id number]  HPI: Katelyn Peterson is a 72 y.o.-year-old female, returning for f/u for DM2, dx 2000, non-insulin-dependent, controlled, with complications (mild PDR in [facility]). Last visit 6 months ago.  Last hemoglobin A1c was: Lab Results  Component Value Date   HGBA1C 6.5 01/07/2017   HGBA1C 6.0 10/29/2016   HGBA1C 6.2 06/28/2016   Pt is on a regimen of: - Metformin 1000 mg 2x a day - Januvia 100 mg daily in am We stopped Amaryl 2 mg bid and Actos 30 mg daily for daily hypoglycemia events and possible SEs, respectively.  Pt checks her sugars 1-2x a day: - am: 80-112, 116, 124 >> 87-114, 124 >> 80s-110 - 2h after b'fast: n/c >> 131 >> n/c - before lunch: 84-109 >> 102 >> n/c >> 93 >> 100-110 - 2h after lunch: 110 >> n/c>> 110, 121 >> n/c - before dinner: 70 >> n/c >> 105, 115 >> n/c - 2h after dinner: n/c>> 135, 135 >> n/c - bedtime:   90 >> 70 >> n/c Lowest sugar was 80 >> 87 >> 80;  she has hypoglycemia awareness in the 80s.  Highest sugar was 124 >> 135 >> 120s.  - No CKD, last BUN/creatinine:  Lab Results  Component Value Date   BUN 21 01/07/2017   CREATININE 1.06 01/07/2017  She is on quinapril 40.  Last ACR normal: Lab Results  Component Value Date   MICRALBCREAT 1.5 01/07/2017   MICRALBCREAT 0.9 05/08/2016   -+ HL; last set of lipids: Lab Results  Component Value Date   CHOL 208 (H) 01/07/2017   HDL 56.00 01/07/2017   LDLCALC 134 (H) 01/07/2017   LDLDIRECT 146.5 09/18/2012   TRIG 90.0 01/07/2017   CHOLHDL 4 01/07/2017  She developed leg cramps with pravastatin and atorvastatin.  She now takes Crestor, but still has leg cramps and takes it very rarely.  - last eye exam was 04/2016: + Mild PDR in [facility], stable. Dr. Edyth Gunnels. - no numbness and tingling in her feet.  She also has a history of HTN, obesity. She has been considered for lap band surgery >> she did not want to follow through with  thiss. She also has bilateral chronic lower extremity cramps, right knee pain, anemia.  ROS: Constitutional: no weight gain/no weight loss, no fatigue, no subjective hyperthermia, no subjective hypothermia Eyes: no blurry vision, no xerophthalmia ENT: no sore throat, no nodules palpated in throat, no dysphagia, no odynophagia, no hoarseness Cardiovascular: no CP/no SOB/no palpitations/no leg swelling Respiratory: no cough/no SOB/no wheezing Gastrointestinal: no N/no V/no D/no C/no acid reflux Musculoskeletal: no muscle aches/no joint aches Skin: no rashes, no hair loss Neurological: no tremors/no numbness/no tingling/no dizziness  I reviewed pt's medications, allergies, PMH, social hx, family hx, and changes were documented in the history of present illness. Otherwise, unchanged from my initial visit note.   PE: BP 138/78 (BP Location: Left Arm, Patient Position: Sitting, Cuff Size: Normal)   Pulse 74   Ht 5\' 4"  (1.626 m)   Wt 203 lb 6.4 oz (92.3 kg)   SpO2 98%   BMI 34.91 kg/m  Body mass index is 34.91 kg/m.  Wt Readings from Last 3 Encounters:  04/23/17 203 lb 6.4 oz (92.3 kg)  01/07/17 206 lb (93.4 kg)  11/05/16 206 lb (93.4 kg)   Constitutional: overweight, in NAD Eyes: PERRLA, EOMI, no exophthalmos ENT: moist mucous membranes, no thyromegaly, no cervical lymphadenopathy  Cardiovascular: RRR, No MRG Respiratory: CTA B Gastrointestinal: abdomen soft, NT, ND, BS+ Musculoskeletal: no deformities, strength intact in all 4 Skin: moist, warm, no rashes Neurological: no tremor with outstretched hands, DTR normal in all 4  ASSESSMENT: 1. 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 Hyperlipidemia - Developed leg cramps with pravastatin and atorvastatin - started Crestor - taking it seldom now  3. Goiter by palpation 11/04/2012: Thyroid U/S:  Right thyroid lobe: 4.3 x 1.0 x 1.8 cm   Left thyroid lobe: 4.6 x 1.2 x 1.9 cm   Isthmus: 5 mm    Focal nodules: Thyroid echotexture is homogeneous, without solid or cystic lesion.   Lymphadenopathy: None visualized.  IMPRESSION:  Normal thyroid ultrasound. No evidence of thyromegaly or dominant solid/cystic mass.  PLAN:  1. DM2 Patient with Long-standing, well-controlled diabetes, on oral antidiabetic regimen, with good control.  No hypoglycemic or hyperglycemic spikes - At this visit, her sugars are as good as before -all at target.  However, she only checks before meals and I suspect that after meals they are slightly higher.  Still, no changes are needed in her regimen for now. - Reviewed together her latest HbA1c from 01/07/2017: 6.5%, worse; today, 6.3%, better. - I suggested to:  Patient Instructions  Please continue: - Metformin 1000 mg 2x a day - Januvia 100 mg daily in am  Please come back for a follow-up appointment in 6 months.  - continue checking sugars at different times of the day - check 1x a day, rotating checks - advised for yearly eye exams >> she is UTD - Return to clinic in 3 mo with sugar log    2. Hyperlipidemia -Reviewed latest lipid panel from 12/2016: LDL still high -She is actually not taking Crestor 5 mg (leg cramps)  -I suggested fluvastatin XL, which should give less leg cramps.  She agrees.  Prescription sent to pharmacy.  3. Non-nodular Goiter -No neck compression symptoms -Reviewed her latest thyroid ultrasound report from 2014 -Continue to follow her clinically  Philemon Kingdom, MD PhD [facility]

## 2017-04-25 ENCOUNTER — Telehealth: Payer: Self-pay

## 2017-04-25 NOTE — Telephone Encounter (Signed)
PA iniated for Fluvastatin and states it will take a 48-72 hour turn around time.    [id number]

## 2017-04-26 ENCOUNTER — Other Ambulatory Visit: Payer: Self-pay | Admitting: Internal Medicine

## 2017-04-26 ENCOUNTER — Other Ambulatory Visit: Payer: Self-pay | Admitting: Family

## 2017-04-26 MED ORDER — PREDNISONE 20 MG PO TABS
40.0000 mg | ORAL_TABLET | Freq: Every day | ORAL | 0 refills | Status: DC
Start: 2017-04-26 — End: 2017-08-06

## 2017-04-26 MED FILL — predniSONE 20 MG TABS: 20 | 5 days supply | Qty: 10 | Fill #0

## 2017-04-26 MED FILL — QUINAPRIL 40 MG TABLET: 40 | 90 days supply | Qty: 90 | Fill #0

## 2017-04-30 NOTE — Telephone Encounter (Signed)
PA for fluvastatin was approved, 04/24/17-04/23/18 with a max of 12 refills.

## 2017-05-02 MED FILL — FLUVASTATIN ER 80 MG TABLET: 80 | 30 days supply | Qty: 30 | Fill #0

## 2017-05-07 MED FILL — VERAPAMIL ER 240 MG CAPSULE: 240 | 90 days supply | Qty: 90 | Fill #2

## 2017-05-08 ENCOUNTER — Encounter: Payer: Self-pay | Admitting: Internal Medicine

## 2017-05-08 ENCOUNTER — Ambulatory Visit: Payer: 59 | Admitting: Internal Medicine

## 2017-05-08 VITALS — BP 128/72 | HR 68 | Temp 98.3°F | Ht 64.0 in | Wt 205.0 lb

## 2017-05-08 DIAGNOSIS — E113592 Type 2 diabetes mellitus with proliferative diabetic retinopathy without macular edema, left eye: Secondary | ICD-10-CM | POA: Diagnosis not present

## 2017-05-08 DIAGNOSIS — I10 Essential (primary) hypertension: Secondary | ICD-10-CM | POA: Diagnosis not present

## 2017-05-08 DIAGNOSIS — E1169 Type 2 diabetes mellitus with other specified complication: Secondary | ICD-10-CM | POA: Diagnosis not present

## 2017-05-08 DIAGNOSIS — Z6835 Body mass index (BMI) 35.0-35.9, adult: Secondary | ICD-10-CM | POA: Diagnosis not present

## 2017-05-08 DIAGNOSIS — E785 Hyperlipidemia, unspecified: Secondary | ICD-10-CM

## 2017-05-08 MED FILL — SPIRONOLACTONE 50 MG TABLET: 50 | 90 days supply | Qty: 90 | Fill #1

## 2017-05-08 NOTE — Assessment & Plan Note (Signed)
A1c was good 

## 2017-05-08 NOTE — Assessment & Plan Note (Signed)
Spironolactone, quinapril, Verapamil 

## 2017-05-08 NOTE — Progress Notes (Signed)
Subjective:  Patient ID: Katelyn Peterson, female    DOB: 03-20-1945  Age: 72 y.o. MRN: [id number]  CC: No chief complaint on file.   HPI Katelyn Peterson presents for DM, HTN, obesity f/u  Outpatient Medications Prior to Visit  Medication Sig Dispense Refill  . cetirizine (ZYRTEC) 10 MG tablet TAKE ONE TABLET BY MOUTH ONCE DAILY 70 tablet 5  . Cholecalciferol 1000 UNITS tablet Take 1,000 Units by mouth daily.      . Diclofenac Sodium 2 % SOLN Apply 1 pump twice daily. 112 g 3  . fluvastatin XL (LESCOL XL) 80 MG 24 hr tablet Take 1 tablet (80 mg total) by mouth daily. 30 tablet 11  . ibuprofen (ADVIL,MOTRIN) 600 MG tablet TAKE 1 TABLET BY MOUTH TWICE DAILY AFTER MEALS FOR 1 WEEK, THEN AS NEEDED FOR PAIN 60 tablet 2  . loratadine (CLARITIN) 10 MG tablet Take 1 tablet (10 mg total) by mouth daily. 90 tablet 0  . meloxicam (MOBIC) 7.5 MG tablet Take 1 tablet (7.5 mg total) by mouth daily as needed for pain. 30 tablet 1  . Menthol, Topical Analgesic, (BIOFREEZE) 4 % GEL Apply to affected area daily as needed 150 mL 0  . metFORMIN (GLUCOPHAGE) 1000 MG tablet Take 1 tablet (1,000 mg total) by mouth 2 (two) times daily with a meal. 180 tablet 3  . OVER THE COUNTER MEDICATION Goody Powder for headaches. PRN    . predniSONE (DELTASONE) 20 MG tablet Take 2 tablets (40 mg total) by mouth daily with breakfast. 10 tablet 0  . quinapril (ACCUPRIL) 40 MG tablet TAKE 1 TABLET BY MOUTH DAILY. 90 tablet 1  . sitaGLIPtin (JANUVIA) 100 MG tablet Take 1 tablet (100 mg total) by mouth daily. ( In the AM) 90 tablet 3  . spironolactone (ALDACTONE) 50 MG tablet Take 1 tablet (50 mg total) daily by mouth. 90 tablet 3  . traMADol (ULTRAM) 50 MG tablet Take 1-2 tablets (50-100 mg total) by mouth 2 (two) times daily as needed. 100 tablet 1  . verapamil (VERELAN PM) 240 MG 24 hr capsule TAKE 1 CAPSULE BY MOUTH AT BEDTIME. 90 capsule 2   Facility-Administered Medications Prior to Visit  Medication Dose Route  Frequency Provider Last Rate Last Dose  . 0.9 %  sodium chloride infusion  500 mL Intravenous Continuous Milus Banister, MD        ROS Review of Systems  Constitutional: Negative for activity change, appetite change, chills, fatigue and unexpected weight change.  HENT: Negative for congestion, mouth sores and sinus pressure.   Eyes: Negative for visual disturbance.  Respiratory: Negative for cough and chest tightness.   Gastrointestinal: Negative for abdominal pain and nausea.  Genitourinary: Negative for difficulty urinating, frequency and vaginal pain.  Musculoskeletal: Negative for back pain and gait problem.  Skin: Negative for pallor and rash.  Neurological: Negative for dizziness, tremors, weakness, numbness and headaches.  Psychiatric/Behavioral: Negative for confusion and sleep disturbance.    Objective:  BP 128/72 (BP Location: Right Arm, Patient Position: Sitting, Cuff Size: Large)   Pulse 68   Temp 98.3 F (36.8 C) (Oral)   Ht 5\' 4"  (1.626 m)   Wt 205 lb (93 kg)   SpO2 98%   BMI 35.19 kg/m   BP Readings from Last 3 Encounters:  05/08/17 128/72  04/23/17 138/78  01/07/17 140/80    Wt Readings from Last 3 Encounters:  05/08/17 205 lb (93 kg)  04/23/17 203 lb 6.4 oz (92.3 kg)  01/07/17 206 lb (93.4 kg)    Physical Exam  Constitutional: She appears well-developed. No distress.  HENT:  Head: Normocephalic.  Right Ear: External ear normal.  Left Ear: External ear normal.  Nose: Nose normal.  Mouth/Throat: Oropharynx is clear and moist.  Eyes: Conjunctivae are normal. Pupils are equal, round, and reactive to light. Right eye exhibits no discharge. Left eye exhibits no discharge.  Neck: Normal range of motion. Neck supple. No JVD present. No tracheal deviation present. No thyromegaly present.  Cardiovascular: Normal rate, regular rhythm and normal heart sounds.  Pulmonary/Chest: No stridor. No respiratory distress. She has no wheezes.  Abdominal: Soft. Bowel  sounds are normal. She exhibits no distension and no mass. There is no tenderness. There is no rebound and no guarding.  Musculoskeletal: She exhibits no edema or tenderness.  Lymphadenopathy:    She has no cervical adenopathy.  Neurological: She displays normal reflexes. No cranial nerve deficit. She exhibits normal muscle tone. Coordination normal.  Skin: No rash noted. No erythema.  Psychiatric: She has a normal mood and affect. Her behavior is normal. Judgment and thought content normal.    Lab Results  Component Value Date   WBC 6.6 01/07/2017   HGB 11.3 (L) 01/07/2017   HCT 34.0 (L) 01/07/2017   PLT 274.0 01/07/2017   GLUCOSE 123 (H) 01/07/2017   CHOL 208 (H) 01/07/2017   TRIG 90.0 01/07/2017   HDL 56.00 01/07/2017   LDLDIRECT 146.5 09/18/2012   LDLCALC 134 (H) 01/07/2017   ALT 9 01/07/2017   AST 10 01/07/2017   NA 140 01/07/2017   K 4.6 01/07/2017   CL 107 01/07/2017   CREATININE 1.06 01/07/2017   BUN 21 01/07/2017   CO2 25 01/07/2017   TSH 1.70 01/07/2017   HGBA1C 6.3 04/23/2017   MICROALBUR 0.9 01/07/2017    Dg Ankle Complete Left  Result Date: 04/01/2015 CLINICAL DATA:  Anterior left ankle pain for 6 months. No known injury. History of bone spur. EXAM: LEFT ANKLE COMPLETE - 3+ VIEW COMPARISON:  Radiographs 08/22/2011. FINDINGS: The bones are demineralized. There is no evidence of acute fracture or dislocation. Spurring of the medial malleolus and posterior aspect of the calcaneal tuberosity is unchanged. The inferior aspect of the calcaneus is not imaged. Moderate midfoot degenerative changes are noted. The soft tissue surrounding the ankle are diffusely prominent as before. There are scattered vascular calcifications. IMPRESSION: Stable left ankle radiographs. Degenerative changes as described without acute findings. Electronically Signed   By: Richardean Sale M.D.   On: 04/01/2015 13:36   [place] Extrem Low Left Ltd  Result Date: 04/04/2015 [facility] US performed of: *Left  ankle This study was ordered, performed, and interpreted by Charlann Boxer D.O. Foot/Ankle:  All structures visualized.  Talar dome has anterior spurring. Large anterior spur noted right near the anterior tibialis tendon. Hypoechoic changes and increasing Doppler flow Ankle mortise with trace effusion Peroneus longus and brevis tendons unremarkable on long and transverse views without sheath effusions. Posterior tibialis, flexor hallucis longus, and flexor digitorum longus tendons unremarkable on long and transverse views without sheath effusions. Achilles tendon visualized along length of tendon and unremarkable on long and transverse views without sheath effusion. Anterior Talofibular Ligament and Calcaneofibular Ligaments unremarkable and intact. Deltoid Ligament unremarkable and intact. Plantar fascia intact and without effusion, normal thickness. No increased doppler signal, cap sign, or thickening of tibial cortex. Power doppler signal normal.   Moderate ankle arthritis with small effusion and spurring of the anterior aspect causing anterior  tibialis tendinitis    Assessment & Plan:   There are no diagnoses linked to this encounter. I am having Katelyn Peterson maintain her Cholecalciferol, loratadine, Menthol (Topical Analgesic), cetirizine, Diclofenac Sodium, traMADol, ibuprofen, verapamil, OVER THE COUNTER MEDICATION, meloxicam, spironolactone, fluvastatin XL, sitaGLIPtin, metFORMIN, predniSONE, and quinapril. We will continue to administer sodium chloride.  No orders of the defined types were placed in this encounter.    Follow-up: No Follow-up on file.  Walker Kehr, MD

## 2017-05-08 NOTE — Assessment & Plan Note (Signed)
Wt Readings from Last 3 Encounters:  05/08/17 205 lb (93 kg)  04/23/17 203 lb 6.4 oz (92.3 kg)  01/07/17 206 lb (93.4 kg)  on diet

## 2017-05-08 NOTE — Assessment & Plan Note (Signed)
-   Crestor 

## 2017-06-19 ENCOUNTER — Ambulatory Visit (INDEPENDENT_AMBULATORY_CARE_PROVIDER_SITE_OTHER)
Admission: RE | Admit: 2017-06-19 | Discharge: 2017-06-19 | Disposition: A | Discharge status: Home / Self Care | Payer: 59 | Source: Ambulatory Visit | Attending: Internal Medicine | Admitting: Internal Medicine

## 2017-06-19 ENCOUNTER — Other Ambulatory Visit (INDEPENDENT_AMBULATORY_CARE_PROVIDER_SITE_OTHER): Payer: 59

## 2017-06-19 ENCOUNTER — Encounter: Payer: Self-pay | Admitting: Internal Medicine

## 2017-06-19 ENCOUNTER — Ambulatory Visit: Payer: 59 | Admitting: Internal Medicine

## 2017-06-19 DIAGNOSIS — G8929 Other chronic pain: Secondary | ICD-10-CM

## 2017-06-19 DIAGNOSIS — M25561 Pain in right knee: Secondary | ICD-10-CM

## 2017-06-19 DIAGNOSIS — M1711 Unilateral primary osteoarthritis, right knee: Secondary | ICD-10-CM | POA: Diagnosis not present

## 2017-06-19 LAB — URIC ACID: URIC ACID, SERUM: 5.6 mg/dL (ref 2.4–7.0)

## 2017-06-19 LAB — BASIC METABOLIC PANEL
BUN: 13 mg/dL (ref 6–23)
CHLORIDE: 106 meq/L (ref 96–112)
CO2: 27 meq/L (ref 19–32)
CREATININE: 0.87 mg/dL (ref 0.40–1.20)
Calcium: 9.1 mg/dL (ref 8.4–10.5)
GFR: 82.48 mL/min (ref >60.00)
Glucose, Bld: 79 mg/dL (ref 70–99)
Potassium: 4.1 mEq/L (ref 3.5–5.1)
Sodium: 139 mEq/L (ref 135–145)

## 2017-06-19 LAB — SEDIMENTATION RATE: Sed Rate: 58 mm/hr — ABNORMAL HIGH (ref 0–30)

## 2017-06-19 MED FILL — JANUVIA 100 MG TABLET: 100 | 60 days supply | Qty: 60 | Fill #3

## 2017-06-19 NOTE — Assessment & Plan Note (Signed)
acute on chronic pain  X ray Labs

## 2017-06-19 NOTE — Progress Notes (Signed)
Subjective:  Patient ID: Katelyn Peterson, female    DOB: [birth year]  Age: 72 y.o. MRN: [id number]  CC: No chief complaint on file.   HPI Katelyn Peterson presents for R knee pain and swelling since Thur. Better now by 80-90%. Pt took Aleve... Pain off and on  Outpatient Medications Prior to Visit  Medication Sig Dispense Refill  . cetirizine (ZYRTEC) 10 MG tablet TAKE ONE TABLET BY MOUTH ONCE DAILY 70 tablet 5  . Cholecalciferol 1000 UNITS tablet Take 1,000 Units by mouth daily.      . Diclofenac Sodium 2 % SOLN Apply 1 pump twice daily. 112 g 3  . fluvastatin XL (LESCOL XL) 80 MG 24 hr tablet Take 1 tablet (80 mg total) by mouth daily. 30 tablet 11  . ibuprofen (ADVIL,MOTRIN) 600 MG tablet TAKE 1 TABLET BY MOUTH TWICE DAILY AFTER MEALS FOR 1 WEEK, THEN AS NEEDED FOR PAIN 60 tablet 2  . loratadine (CLARITIN) 10 MG tablet Take 1 tablet (10 mg total) by mouth daily. 90 tablet 0  . meloxicam (MOBIC) 7.5 MG tablet Take 1 tablet (7.5 mg total) by mouth daily as needed for pain. 30 tablet 1  . Menthol, Topical Analgesic, (BIOFREEZE) 4 % GEL Apply to affected area daily as needed 150 mL 0  . metFORMIN (GLUCOPHAGE) 1000 MG tablet Take 1 tablet (1,000 mg total) by mouth 2 (two) times daily with a meal. 180 tablet 3  . OVER THE COUNTER MEDICATION Goody Powder for headaches. PRN    . predniSONE (DELTASONE) 20 MG tablet Take 2 tablets (40 mg total) by mouth daily with breakfast. 10 tablet 0  . quinapril (ACCUPRIL) 40 MG tablet TAKE 1 TABLET BY MOUTH DAILY. 90 tablet 1  . sitaGLIPtin (JANUVIA) 100 MG tablet Take 1 tablet (100 mg total) by mouth daily. ( In the AM) 90 tablet 3  . spironolactone (ALDACTONE) 50 MG tablet Take 1 tablet (50 mg total) daily by mouth. 90 tablet 3  . traMADol (ULTRAM) 50 MG tablet Take 1-2 tablets (50-100 mg total) by mouth 2 (two) times daily as needed. 100 tablet 1  . verapamil (VERELAN PM) 240 MG 24 hr capsule TAKE 1 CAPSULE BY MOUTH AT BEDTIME. 90 capsule 2    Facility-Administered Medications Prior to Visit  Medication Dose Route Frequency Provider Last Rate Last Dose  . 0.9 %  sodium chloride infusion  500 mL Intravenous Continuous Milus Banister, MD        ROS Review of Systems  Constitutional: Negative for activity change, appetite change, chills, fatigue and unexpected weight change.  HENT: Negative for congestion, mouth sores and sinus pressure.   Eyes: Negative for visual disturbance.  Respiratory: Negative for cough and chest tightness.   Gastrointestinal: Negative for abdominal pain and nausea.  Genitourinary: Negative for difficulty urinating, frequency and vaginal pain.  Musculoskeletal: Positive for arthralgias. Negative for back pain and gait problem.  Skin: Negative for pallor and rash.  Neurological: Negative for dizziness, tremors, weakness, numbness and headaches.  Psychiatric/Behavioral: Negative for confusion and sleep disturbance.    Objective:  BP 140/74 (BP Location: Right Arm, Patient Position: Sitting, Cuff Size: Large)   Pulse 69   Temp 98.4 F (36.9 C) (Oral)   Ht 5\' 4"  (1.626 m)   Wt 205 lb (93 kg)   SpO2 99%   BMI 35.19 kg/m   BP Readings from Last 3 Encounters:  06/19/17 140/74  05/08/17 128/72  04/23/17 138/78    Wt Readings from  Last 3 Encounters:  06/19/17 205 lb (93 kg)  05/08/17 205 lb (93 kg)  04/23/17 203 lb 6.4 oz (92.3 kg)    Physical Exam  Constitutional: She appears well-developed. No distress.  HENT:  Head: Normocephalic.  Right Ear: External ear normal.  Left Ear: External ear normal.  Nose: Nose normal.  Mouth/Throat: Oropharynx is clear and moist.  Eyes: Pupils are equal, round, and reactive to light. Conjunctivae are normal. Right eye exhibits no discharge. Left eye exhibits no discharge.  Neck: Normal range of motion. Neck supple. No JVD present. No tracheal deviation present. No thyromegaly present.  Cardiovascular: Normal rate, regular rhythm and normal heart sounds.   Pulmonary/Chest: No stridor. No respiratory distress. She has no wheezes.  Abdominal: Soft. Bowel sounds are normal. She exhibits no distension and no mass. There is no tenderness. There is no rebound and no guarding.  Musculoskeletal: She exhibits no edema or tenderness.  Lymphadenopathy:    She has no cervical adenopathy.  Neurological: She displays normal reflexes. No cranial nerve deficit. She exhibits normal muscle tone. Coordination normal.  Skin: No rash noted. No erythema.  Psychiatric: She has a normal mood and affect. Her behavior is normal. Judgment and thought content normal.  R knee is a little swollen compare to L, NT  Lab Results  Component Value Date   WBC 6.6 01/07/2017   HGB 11.3 (L) 01/07/2017   HCT 34.0 (L) 01/07/2017   PLT 274.0 01/07/2017   GLUCOSE 123 (H) 01/07/2017   CHOL 208 (H) 01/07/2017   TRIG 90.0 01/07/2017   HDL 56.00 01/07/2017   LDLDIRECT 146.5 09/18/2012   LDLCALC 134 (H) 01/07/2017   ALT 9 01/07/2017   AST 10 01/07/2017   NA 140 01/07/2017   K 4.6 01/07/2017   CL 107 01/07/2017   CREATININE 1.06 01/07/2017   BUN 21 01/07/2017   CO2 25 01/07/2017   TSH 1.70 01/07/2017   HGBA1C 6.3 04/23/2017   MICROALBUR 0.9 01/07/2017    Dg Ankle Complete Left  Result Date: 04/01/2015 CLINICAL DATA:  Anterior left ankle pain for 6 months. No known injury. History of bone spur. EXAM: LEFT ANKLE COMPLETE - 3+ VIEW COMPARISON:  Radiographs 08/22/2011. FINDINGS: The bones are demineralized. There is no evidence of acute fracture or dislocation. Spurring of the medial malleolus and posterior aspect of the calcaneal tuberosity is unchanged. The inferior aspect of the calcaneus is not imaged. Moderate midfoot degenerative changes are noted. The soft tissue surrounding the ankle are diffusely prominent as before. There are scattered vascular calcifications. IMPRESSION: Stable left ankle radiographs. Degenerative changes as described without acute findings.  Electronically Signed   By: Richardean Sale M.D.   On: 04/01/2015 13:36   [place] Extrem Low Left Ltd  Result Date: 04/04/2015 [facility] US performed of: *Left ankle This study was ordered, performed, and interpreted by Charlann Boxer D.O. Foot/Ankle:  All structures visualized.  Talar dome has anterior spurring. Large anterior spur noted right near the anterior tibialis tendon. Hypoechoic changes and increasing Doppler flow Ankle mortise with trace effusion Peroneus longus and brevis tendons unremarkable on long and transverse views without sheath effusions. Posterior tibialis, flexor hallucis longus, and flexor digitorum longus tendons unremarkable on long and transverse views without sheath effusions. Achilles tendon visualized along length of tendon and unremarkable on long and transverse views without sheath effusion. Anterior Talofibular Ligament and Calcaneofibular Ligaments unremarkable and intact. Deltoid Ligament unremarkable and intact. Plantar fascia intact and without effusion, normal thickness. No increased doppler signal,  cap sign, or thickening of tibial cortex. Power doppler signal normal.   Moderate ankle arthritis with small effusion and spurring of the anterior aspect causing anterior tibialis tendinitis    Assessment & Plan:   There are no diagnoses linked to this encounter. I am having Cathe Jani Gravel maintain her Cholecalciferol, loratadine, Menthol (Topical Analgesic), cetirizine, Diclofenac Sodium, traMADol, ibuprofen, verapamil, OVER THE COUNTER MEDICATION, meloxicam, spironolactone, fluvastatin XL, sitaGLIPtin, metFORMIN, predniSONE, and quinapril. We will continue to administer sodium chloride.  No orders of the defined types were placed in this encounter.    Follow-up: No follow-ups on file.  Walker Kehr, MD

## 2017-08-06 ENCOUNTER — Ambulatory Visit: Payer: 59 | Admitting: Adult Health

## 2017-08-06 ENCOUNTER — Encounter: Payer: Self-pay | Admitting: Adult Health

## 2017-08-06 VITALS — BP 120/70

## 2017-08-06 DIAGNOSIS — T7840XA Allergy, unspecified, initial encounter: Secondary | ICD-10-CM | POA: Diagnosis not present

## 2017-08-06 MED ORDER — METHYLPREDNISOLONE ACETATE 80 MG/ML IJ SUSP
80.0000 mg | Freq: Once | INTRAMUSCULAR | Status: AC
  Administered 2017-08-06: 80 mg via INTRAMUSCULAR

## 2017-08-06 MED ORDER — PREDNISONE 10 MG PO TABS
10.0000 mg | ORAL_TABLET | Freq: Every day | ORAL | 0 refills | Status: DC
Start: 2017-08-06 — End: 2017-10-29

## 2017-08-06 MED FILL — predniSONE 10 MG TABS: 10 | 5 days supply | Qty: 5 | Fill #0

## 2017-08-06 NOTE — Progress Notes (Signed)
Subjective:    Patient ID: Katelyn Peterson, female    DOB: 10/21/1945, 72 y.o.   MRN: [id number]  HPI 72 year old female who   has a past medical history of Arthritis, HTN (hypertension), Hyperlipidemia, Obesity, and Type II or unspecified type diabetes mellitus without mention of complication, not stated as uncontrolled. Presents to the office today for an acute issue of swelling to left orbit. Edema has been present for two days. Has not been improving but has not been getting worse either. Denies any known bug bite, changes in detergents, soaps, or lotions. Has no pain, itching, or blurred vision. Does endorse clear drainage from left eye at times.   Review of Systems See HPI   Past Medical History:  Diagnosis Date  . Arthritis   . HTN (hypertension)   . Hyperlipidemia   . Obesity   . Type II or unspecified type diabetes mellitus without mention of complication, not stated as uncontrolled     Social History   Socioeconomic History  . Marital status: Married    Spouse name: Not on file  . Number of children: Not on file  . Years of education: Not on file  . Highest education level: Not on file  Occupational History  . Occupation: Public house manager  Social Needs  . Financial resource strain: Not on file  . Food insecurity:    Worry: Not on file    Inability: Not on file  . Transportation needs:    Medical: Not on file    Non-medical: Not on file  Tobacco Use  . Smoking status: Never Smoker  . Smokeless tobacco: Never Used  Substance and Sexual Activity  . Alcohol use: No  . Drug use: No  . Sexual activity: Yes    Partners: Male  Lifestyle  . Physical activity:    Days per week: Not on file    Minutes per session: Not on file  . Stress: Not on file  Relationships  . Social connections:    Talks on phone: Not on file    Gets together: Not on file    Attends religious service: Not on file    Active member of club or organization: Not on file    Attends  meetings of clubs or organizations: Not on file    Relationship status: Not on file  . Intimate partner violence:    Fear of current or ex partner: Not on file    Emotionally abused: Not on file    Physically abused: Not on file    Forced sexual activity: Not on file  Other Topics Concern  . Not on file  Social History Narrative   Regular exercise: seldom   Caffeine use: occasionally          Past Surgical History:  Procedure Laterality Date  . ABDOMINAL HYSTERECTOMY      Family History  Problem Relation Age of Onset  . Hypertension Mother   . Diabetes Mother   . Hypertension Father   . Hypertension Other   . Colon cancer Neg Hx   . Esophageal cancer Neg Hx   . Rectal cancer Neg Hx   . Stomach cancer Neg Hx     Allergies  Allergen Reactions  . Atorvastatin     REACTION: cramps  . Hydrochlorothiazide     REACTION: Cramps  . Pravastatin     cramps    Current Outpatient Medications on File Prior to Visit  Medication Sig Dispense Refill  .  cetirizine (ZYRTEC) 10 MG tablet TAKE ONE TABLET BY MOUTH ONCE DAILY 70 tablet 5  . Cholecalciferol 1000 UNITS tablet Take 1,000 Units by mouth daily.      . Diclofenac Sodium 2 % SOLN Apply 1 pump twice daily. 112 g 3  . fluvastatin XL (LESCOL XL) 80 MG 24 hr tablet Take 1 tablet (80 mg total) by mouth daily. 30 tablet 11  . ibuprofen (ADVIL,MOTRIN) 600 MG tablet TAKE 1 TABLET BY MOUTH TWICE DAILY AFTER MEALS FOR 1 WEEK, THEN AS NEEDED FOR PAIN 60 tablet 2  . loratadine (CLARITIN) 10 MG tablet Take 1 tablet (10 mg total) by mouth daily. 90 tablet 0  . meloxicam (MOBIC) 7.5 MG tablet Take 1 tablet (7.5 mg total) by mouth daily as needed for pain. 30 tablet 1  . Menthol, Topical Analgesic, (BIOFREEZE) 4 % GEL Apply to affected area daily as needed 150 mL 0  . metFORMIN (GLUCOPHAGE) 1000 MG tablet Take 1 tablet (1,000 mg total) by mouth 2 (two) times daily with a meal. 180 tablet 3  . OVER THE COUNTER MEDICATION Goody Powder for  headaches. PRN    . quinapril (ACCUPRIL) 40 MG tablet TAKE 1 TABLET BY MOUTH DAILY. 90 tablet 1  . sitaGLIPtin (JANUVIA) 100 MG tablet Take 1 tablet (100 mg total) by mouth daily. ( In the AM) 90 tablet 3  . spironolactone (ALDACTONE) 50 MG tablet Take 1 tablet (50 mg total) daily by mouth. 90 tablet 3  . traMADol (ULTRAM) 50 MG tablet Take 1-2 tablets (50-100 mg total) by mouth 2 (two) times daily as needed. 100 tablet 1  . verapamil (VERELAN PM) 240 MG 24 hr capsule TAKE 1 CAPSULE BY MOUTH AT BEDTIME. 90 capsule 2   Current Facility-Administered Medications on File Prior to Visit  Medication Dose Route Frequency Provider Last Rate Last Dose  . 0.9 %  sodium chloride infusion  500 mL Intravenous Continuous Milus Banister, MD        BP 120/70       Objective:   Physical Exam  Constitutional: She appears well-developed and well-nourished. No distress.  Eyes: Pupils are equal, round, and reactive to light. Conjunctivae, EOM and lids are normal. Lids are everted and swept, no foreign bodies found. Right conjunctiva is not injected.  Neck: Normal range of motion. Neck supple.  Cardiovascular: Normal rate, regular rhythm, normal heart sounds and intact distal pulses. Exam reveals no gallop and no friction rub.  No murmur heard. Pulmonary/Chest: Effort normal and breath sounds normal. No stridor. No respiratory distress. She has no wheezes. She has no rales. She exhibits no tenderness.  Musculoskeletal: She exhibits edema (significant edema noted around left orbit ). She exhibits no tenderness or deformity.  Neurological: She is alert.  Skin: Skin is warm and dry. Capillary refill takes less than 2 seconds. She is not diaphoretic.  Psychiatric: She has a normal mood and affect. Her behavior is normal. Judgment and thought content normal.  Vitals reviewed.     Assessment & Plan:  1. Allergic reaction, initial encounter - methylPREDNISolone acetate (DEPO-MEDROL) injection 80 mg -  predniSONE (DELTASONE) 10 MG tablet; Take 1 tablet (10 mg total) by mouth daily with breakfast.  Dispense: 5 tablet; Refill: 0 - Apply cold compresses throughout the day  - Follow up as needed  Dorothyann Peng, NP

## 2017-08-08 DIAGNOSIS — E113293 Type 2 diabetes mellitus with mild nonproliferative diabetic retinopathy without macular edema, bilateral: Secondary | ICD-10-CM | POA: Diagnosis not present

## 2017-08-08 DIAGNOSIS — Z7984 Long term (current) use of oral hypoglycemic drugs: Secondary | ICD-10-CM | POA: Diagnosis not present

## 2017-08-08 DIAGNOSIS — H2513 Age-related nuclear cataract, bilateral: Secondary | ICD-10-CM | POA: Diagnosis not present

## 2017-08-08 DIAGNOSIS — H524 Presbyopia: Secondary | ICD-10-CM | POA: Diagnosis not present

## 2017-08-08 DIAGNOSIS — H5203 Hypermetropia, bilateral: Secondary | ICD-10-CM | POA: Diagnosis not present

## 2017-08-08 DIAGNOSIS — H52203 Unspecified astigmatism, bilateral: Secondary | ICD-10-CM | POA: Diagnosis not present

## 2017-08-08 DIAGNOSIS — H25043 Posterior subcapsular polar age-related cataract, bilateral: Secondary | ICD-10-CM | POA: Diagnosis not present

## 2017-08-08 DIAGNOSIS — E119 Type 2 diabetes mellitus without complications: Secondary | ICD-10-CM | POA: Diagnosis not present

## 2017-08-08 DIAGNOSIS — H25013 Cortical age-related cataract, bilateral: Secondary | ICD-10-CM | POA: Diagnosis not present

## 2017-08-08 LAB — HM DIABETES EYE EXAM

## 2017-08-12 MED FILL — SPIRONOLACTONE 50 MG TABLET: 50 | 90 days supply | Qty: 90 | Fill #2

## 2017-08-13 ENCOUNTER — Encounter: Payer: Self-pay | Admitting: Internal Medicine

## 2017-08-14 MED FILL — QUINAPRIL 40 MG TABLET: 40 | 90 days supply | Qty: 90 | Fill #1

## 2017-09-03 MED FILL — JANUVIA 100 MG TABLET: 100 | 60 days supply | Qty: 60 | Fill #4

## 2017-10-25 DIAGNOSIS — M1711 Unilateral primary osteoarthritis, right knee: Secondary | ICD-10-CM | POA: Diagnosis not present

## 2017-10-25 DIAGNOSIS — M1712 Unilateral primary osteoarthritis, left knee: Secondary | ICD-10-CM | POA: Diagnosis not present

## 2017-10-25 DIAGNOSIS — M170 Bilateral primary osteoarthritis of knee: Secondary | ICD-10-CM | POA: Diagnosis not present

## 2017-10-29 ENCOUNTER — Ambulatory Visit: Payer: 59 | Admitting: Internal Medicine

## 2017-10-29 ENCOUNTER — Encounter: Payer: Self-pay | Admitting: Internal Medicine

## 2017-10-29 VITALS — BP 130/80 | HR 84 | Ht 64.0 in | Wt 206.0 lb

## 2017-10-29 DIAGNOSIS — E113592 Type 2 diabetes mellitus with proliferative diabetic retinopathy without macular edema, left eye: Secondary | ICD-10-CM | POA: Diagnosis not present

## 2017-10-29 DIAGNOSIS — E049 Nontoxic goiter, unspecified: Secondary | ICD-10-CM

## 2017-10-29 DIAGNOSIS — E1169 Type 2 diabetes mellitus with other specified complication: Secondary | ICD-10-CM | POA: Diagnosis not present

## 2017-10-29 DIAGNOSIS — E785 Hyperlipidemia, unspecified: Secondary | ICD-10-CM | POA: Diagnosis not present

## 2017-10-29 LAB — POCT GLYCOSYLATED HEMOGLOBIN (HGB A1C): Hemoglobin A1C: 6 % — AB (ref 4.0–5.6)

## 2017-10-29 NOTE — Progress Notes (Signed)
Patient ID: Katelyn Peterson, female   DOB: 1945-06-15, 72 y.o.   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 Last visit 6 months ago.  Last hemoglobin A1c was: Lab Results  Component Value Date   HGBA1C 6.3 04/23/2017   HGBA1C 6.5 01/07/2017   HGBA1C 6.0 10/29/2016   Pt is on a regimen of: - Metformin 1000 mg 2x a day - Januvia 100 mg daily in am We stopped Amaryl 2 mg bid and Actos 30 mg daily for daily hypoglycemia events and possible SEs, respectively.  Pt checks her sugars 1 time a day: - am:  87-114, 124 >> 80s-110 >> 82-110, 125 - 2h after b'fast: n/c >> 131 >> n/c - before lunch: n/c >> 93 >> 100-110 >> n/c - 2h after lunch: 1n/c>> 110, 121 >> n/c - before dinner: 105, 115 >> n/c >> 102 - 2h after dinner: 135, 135 >> n/c - bedtime:   90 >> 70 >> n/c Lowest sugar was 80 >> 82;  she has hypoglycemia awareness in the 80s. Highest sugar was 120s >> 125.  -No CKD, last BUN/creatinine:  Lab Results  Component Value Date   BUN 13 06/19/2017   CREATININE 0.87 06/19/2017  She is on quinapril 40.  Last ACR normal: Lab Results  Component Value Date   MICRALBCREAT 1.5 01/07/2017   MICRALBCREAT 0.9 05/08/2016   -+ HL; last set of lipids: Lab Results  Component Value Date   CHOL 208 (H) 01/07/2017   HDL 56.00 01/07/2017   LDLCALC 134 (H) 01/07/2017   LDLDIRECT 146.5 09/18/2012   TRIG 90.0 01/07/2017   CHOLHDL 4 01/07/2017  She developed leg cramps with rosuvastatin, pravastatin and atorvastatin.  At last visit I suggested fluvastatin XL, but it still caused leg cramps.  - last eye exam was in 07/2017: + DR. Dr. Edyth Gunnels.  - Denies numbness and tingling in her feet.  She also has a history of HTN, obesity. She has been considered for lap band surgery >> she did not want to follow through with thiss. She also has bilateral chronic lower  extremity cramps, right knee pain, anemia.  ROS: Constitutional: no weight gain/no weight loss, no fatigue, no subjective hyperthermia, no subjective hypothermia Eyes: no blurry vision, no xerophthalmia ENT: no sore throat, no nodules palpated in throat, no dysphagia, no odynophagia, no hoarseness Cardiovascular: no CP/no SOB/no palpitations/no leg swelling Respiratory: no cough/no SOB/no wheezing Gastrointestinal: no N/no V/no D/no C/no acid reflux Musculoskeletal: no muscle aches/no joint aches Skin: no rashes, no hair loss Neurological: no tremors/no numbness/no tingling/no dizziness  I reviewed pt's medications, allergies, PMH, social hx, family hx, and changes were documented in the history of present illness. Otherwise, unchanged from my initial visit note.   PE: BP 130/80   Pulse 84   Ht 5\' 4"  (1.626 m)   Wt 206 lb (93.4 kg)   SpO2 98%   BMI 35.36 kg/m  Body mass index is 35.36 kg/m.  Wt Readings from Last 3 Encounters:  10/29/17 206 lb (93.4 kg)  06/19/17 205 lb (93 kg)  05/08/17 205 lb (93 kg)   Constitutional: overweight, in NAD Eyes: PERRLA, EOMI, no exophthalmos ENT: moist mucous membranes, no thyromegaly, no cervical lymphadenopathy Cardiovascular: RRR, No MRG Respiratory: CTA B Gastrointestinal: abdomen soft, NT, ND, BS+ Musculoskeletal: no deformities, strength intact in all 4 Skin: moist, warm, no rashes Neurological: no  tremor with outstretched hands, DTR normal in all 4  ASSESSMENT: 1. 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 Hyperlipidemia - Developed leg cramps with pravastatin, atorvastatin, Crestor, fluvastatin  3. Goiter by palpation 11/04/2012: Thyroid U/S:  Right thyroid lobe: 4.3 x 1.0 x 1.8 cm   Left thyroid lobe: 4.6 x 1.2 x 1.9 cm   Isthmus: 5 mm   Focal nodules: Thyroid echotexture is homogeneous, without solid or cystic lesion.   Lymphadenopathy: None visualized.  IMPRESSION:  Normal thyroid  ultrasound. No evidence of thyromegaly or dominant solid/cystic mass.  PLAN:  1. DM2 Patient with long-standing, well-controlled diabetes, on oral antidiabetic regimen, with good control.  No hypoglycemic or hyperglycemic spikes. - At this visit, her sugars are all at target, per review of her log - There is no need to change her regimen for now.  She has no side effects from metformin and Januvia. - Latest HbA1c was great, is 6.3%, at last visit - I suggested to:  Patient Instructions  Please continue: - Metformin 1000 mg 2x a day - Januvia 100 mg daily in am  Please come back for a follow-up appointment in 6 months.  - today, HbA1c is 6.0% (better) - continue checking sugars at different times of the day - check 1x a day, rotating checks - advised for yearly eye exams >> she is UTD - Return to clinic in 6 mo with sugar log    2. Hyperlipidemia - Reviewed latest lipid panel from 12/2016: LDL still high, only slightly improved Lab Results  Component Value Date   CHOL 208 (H) 01/07/2017   HDL 56.00 01/07/2017   LDLCALC 134 (H) 01/07/2017   LDLDIRECT 146.5 09/18/2012   TRIG 90.0 01/07/2017   CHOLHDL 4 01/07/2017  -She was previously on Crestor 5 mg daily but developed leg cramps.   - At last visit, I suggested fluvastatin XL, which should give less leg cramps, however, she still developed leg cramps and had to stop after a week. - She did start to make dietary changes to reduce the cholesterol in her diet - She is due for a cholesterol panel which she will have at the end of the month.  She may be a candidate for ezetimibe depending on the results  3. Non-nodular Goiter -Denies neck compression symptoms -Reviewed her latest thyroid ultrasound report from 2014 -Continue to follow her clinically  Philemon Kingdom, MD PhD [facility]

## 2017-10-29 NOTE — Patient Instructions (Signed)
Please continue: - Metformin 1000 mg 2x a day - Januvia 100 mg daily in am  Please come back for a follow-up appointment in 6 months. 

## 2017-10-29 NOTE — Addendum Note (Signed)
Addended by: Drucilla Schmidt on: 10/29/2017 03:43 PM   Modules accepted: Orders

## 2017-10-30 ENCOUNTER — Telehealth: Payer: Self-pay | Admitting: Internal Medicine

## 2017-10-30 ENCOUNTER — Other Ambulatory Visit: Payer: Self-pay | Admitting: Internal Medicine

## 2017-10-30 ENCOUNTER — Other Ambulatory Visit: Payer: Self-pay

## 2017-10-30 MED ORDER — QUINAPRIL HCL 40 MG PO TABS: 40.0000 mg | ORAL_TABLET | Freq: Every day | ORAL | 0 refills | Status: DC

## 2017-10-30 MED FILL — metFORMIN HCL 1000 MG TABS: 1000 | 90 days supply | Qty: 180 | Fill #1

## 2017-10-30 MED FILL — SPIRONOLACTONE 50 MG TABS: 50 | 90 days supply | Qty: 90 | Fill #3

## 2017-10-30 MED FILL — JANUVIA 100 MG TABLET: 100 | 60 days supply | Qty: 60 | Fill #0

## 2017-10-30 NOTE — Telephone Encounter (Signed)
Copied from [place] [id number]. Topic: Quick Communication - Rx Refill/Question >> Oct 30, 2017 11:40 AM Cecelia Byars, NT wrote: Medication: quinapril (ACCUPRIL) 40 MG tablet   Has the patient contacted their pharmacy? yes  (Agent: If no, request that the patient contact the pharmacy for the refill.) (Agent: If yes, when and what did the pharmacy advise?) Pharmacy informed the patient they need a new prescription faxed over.  Preferred Pharmacy (with phone number or street name [place], [place] - [address] [phone] (Phone) [phone] (Fax)    Agent: Please be advised that RX refills may take up to 3 business days. We ask that you follow-up with your pharmacy.

## 2017-10-31 MED FILL — QUINAPRIL 40 MG TABLET: 40 | 90 days supply | Qty: 90 | Fill #0

## 2017-11-01 ENCOUNTER — Other Ambulatory Visit (INDEPENDENT_AMBULATORY_CARE_PROVIDER_SITE_OTHER): Payer: 59

## 2017-11-01 DIAGNOSIS — E113592 Type 2 diabetes mellitus with proliferative diabetic retinopathy without macular edema, left eye: Secondary | ICD-10-CM | POA: Diagnosis not present

## 2017-11-01 DIAGNOSIS — E1169 Type 2 diabetes mellitus with other specified complication: Secondary | ICD-10-CM | POA: Diagnosis not present

## 2017-11-01 DIAGNOSIS — I10 Essential (primary) hypertension: Secondary | ICD-10-CM

## 2017-11-01 DIAGNOSIS — E785 Hyperlipidemia, unspecified: Secondary | ICD-10-CM | POA: Diagnosis not present

## 2017-11-01 LAB — CBC WITH DIFFERENTIAL/PLATELET
BASOS ABS: 0.1 10*3/uL (ref 0.0–0.1)
BASOS PCT: 1.1 % (ref 0.0–3.0)
EOS ABS: 0.1 10*3/uL (ref 0.0–0.7)
Eosinophils Relative: 2.4 % (ref 0.0–5.0)
HCT: 32.7 % — ABNORMAL LOW (ref 36.0–46.0)
Hemoglobin: 11 g/dL — ABNORMAL LOW (ref 12.0–15.0)
LYMPHS ABS: 1.4 10*3/uL (ref 0.7–4.0)
LYMPHS PCT: 23.9 % (ref 12.0–46.0)
MCHC: 33.7 g/dL (ref 30.0–36.0)
MCV: 90.1 fl (ref 78.0–100.0)
MONO ABS: 0.4 10*3/uL (ref 0.1–1.0)
Monocytes Relative: 7 % (ref 3.0–12.0)
NEUTROS PCT: 65.6 % (ref 43.0–77.0)
Neutro Abs: 3.8 10*3/uL (ref 1.4–7.7)
PLATELETS: 266 10*3/uL (ref 150.0–400.0)
RBC: 3.63 Mil/uL — ABNORMAL LOW (ref 3.87–5.11)
RDW: 13.8 % (ref 11.5–15.5)
WBC: 5.7 10*3/uL (ref 4.0–10.5)

## 2017-11-01 LAB — URINALYSIS, ROUTINE W REFLEX MICROSCOPIC
Bilirubin Urine: NEGATIVE
Hgb urine dipstick: NEGATIVE
Ketones, ur: NEGATIVE
Nitrite: NEGATIVE
PH: 6.5 (ref 5.0–8.0)
RBC / HPF: NONE SEEN (ref 0–?)
SPECIFIC GRAVITY, URINE: 1.01 (ref 1.000–1.030)
Total Protein, Urine: NEGATIVE
URINE GLUCOSE: NEGATIVE
Urobilinogen, UA: 0.2 (ref 0.0–1.0)

## 2017-11-01 LAB — HEPATIC FUNCTION PANEL
ALBUMIN: 3.7 g/dL (ref 3.5–5.2)
ALT: 11 U/L (ref 0–35)
AST: 7 U/L (ref 0–37)
Alkaline Phosphatase: 79 U/L (ref 39–117)
Bilirubin, Direct: 0.1 mg/dL (ref 0.0–0.3)
Total Bilirubin: 0.3 mg/dL (ref 0.2–1.2)
Total Protein: 6.6 g/dL (ref 6.0–8.3)

## 2017-11-01 LAB — LIPID PANEL
CHOL/HDL RATIO: 3
CHOLESTEROL: 211 mg/dL — AB (ref 0–200)
HDL: 60.7 mg/dL (ref >39.00)
LDL Cholesterol: 136 mg/dL — ABNORMAL HIGH (ref 0–99)
NonHDL: 149.93
TRIGLYCERIDES: 70 mg/dL (ref 0.0–149.0)
VLDL: 14 mg/dL (ref 0.0–40.0)

## 2017-11-01 LAB — BASIC METABOLIC PANEL
BUN: 17 mg/dL (ref 6–23)
CHLORIDE: 105 meq/L (ref 96–112)
CO2: 27 meq/L (ref 19–32)
CREATININE: 1.01 mg/dL (ref 0.40–1.20)
Calcium: 9.3 mg/dL (ref 8.4–10.5)
GFR: 69.36 mL/min (ref >60.00)
Glucose, Bld: 127 mg/dL — ABNORMAL HIGH (ref 70–99)
POTASSIUM: 4.6 meq/L (ref 3.5–5.1)
Sodium: 139 mEq/L (ref 135–145)

## 2017-11-01 LAB — TSH: TSH: 2.17 u[IU]/mL (ref 0.35–4.50)

## 2017-11-05 ENCOUNTER — Encounter: Payer: Self-pay | Admitting: Internal Medicine

## 2017-11-05 ENCOUNTER — Ambulatory Visit: Payer: 59 | Admitting: Internal Medicine

## 2017-11-05 VITALS — BP 142/80 | HR 72 | Temp 97.9°F | Ht 64.0 in | Wt 204.0 lb

## 2017-11-05 DIAGNOSIS — Z0000 Encounter for general adult medical examination without abnormal findings: Secondary | ICD-10-CM | POA: Diagnosis not present

## 2017-11-05 DIAGNOSIS — M25561 Pain in right knee: Secondary | ICD-10-CM | POA: Diagnosis not present

## 2017-11-05 DIAGNOSIS — G8929 Other chronic pain: Secondary | ICD-10-CM

## 2017-11-05 DIAGNOSIS — I10 Essential (primary) hypertension: Secondary | ICD-10-CM

## 2017-11-05 DIAGNOSIS — E113592 Type 2 diabetes mellitus with proliferative diabetic retinopathy without macular edema, left eye: Secondary | ICD-10-CM

## 2017-11-05 MED ORDER — TRAMADOL HCL 50 MG PO TABS: 50.0000 mg | ORAL_TABLET | Freq: Two times a day (BID) | ORAL | 1 refills | Status: DC | PRN

## 2017-11-05 MED FILL — traMADol HCL 50 MG TABS: 50 | 25 days supply | Qty: 100 | Fill #0

## 2017-11-05 NOTE — Patient Instructions (Signed)
CBD oil for pain - topical or by mouth. Deep Roots

## 2017-11-05 NOTE — Assessment & Plan Note (Signed)
Spironolactone, quinapril, Verapamil

## 2017-11-05 NOTE — Progress Notes (Signed)
Subjective:  Patient ID: Katelyn Peterson, female    DOB: May 17, 1945  Age: 72 y.o. MRN: [id number]  CC: No chief complaint on file.   HPI Katelyn Peterson presents for DM, HTN, OA f/u  Outpatient Medications Prior to Visit  Medication Sig Dispense Refill  . cetirizine (ZYRTEC) 10 MG tablet TAKE ONE TABLET BY MOUTH ONCE DAILY 70 tablet 5  . Cholecalciferol 1000 UNITS tablet Take 1,000 Units by mouth daily.      . Diclofenac Sodium 2 % SOLN Apply 1 pump twice daily. 112 g 3  . ibuprofen (ADVIL,MOTRIN) 600 MG tablet TAKE 1 TABLET BY MOUTH TWICE DAILY AFTER MEALS FOR 1 WEEK, THEN AS NEEDED FOR PAIN 60 tablet 2  . loratadine (CLARITIN) 10 MG tablet Take 1 tablet (10 mg total) by mouth daily. 90 tablet 0  . meloxicam (MOBIC) 7.5 MG tablet Take 1 tablet (7.5 mg total) by mouth daily as needed for pain. 30 tablet 1  . Menthol, Topical Analgesic, (BIOFREEZE) 4 % GEL Apply to affected area daily as needed 150 mL 0  . metFORMIN (GLUCOPHAGE) 1000 MG tablet Take 1 tablet (1,000 mg total) by mouth 2 (two) times daily with a meal. 180 tablet 3  . OVER THE COUNTER MEDICATION Goody Powder for headaches. PRN    . quinapril (ACCUPRIL) 40 MG tablet TAKE 1 TABLET BY MOUTH ONCE DAILY 90 tablet 3  . sitaGLIPtin (JANUVIA) 100 MG tablet Take 1 tablet (100 mg total) by mouth daily. ( In the AM) 90 tablet 3  . spironolactone (ALDACTONE) 50 MG tablet Take 1 tablet (50 mg total) daily by mouth. 90 tablet 3  . traMADol (ULTRAM) 50 MG tablet Take 1-2 tablets (50-100 mg total) by mouth 2 (two) times daily as needed. 100 tablet 1  . verapamil (VERELAN PM) 240 MG 24 hr capsule TAKE 1 CAPSULE BY MOUTH AT BEDTIME. 90 capsule 2  . quinapril (ACCUPRIL) 40 MG tablet Take 1 tablet (40 mg total) by mouth daily. 90 tablet 0   Facility-Administered Medications Prior to Visit  Medication Dose Route Frequency Provider Last Rate Last Dose  . 0.9 %  sodium chloride infusion  500 mL Intravenous Continuous Milus Banister, MD         ROS: Review of Systems  Constitutional: Negative for activity change, appetite change, chills, fatigue and unexpected weight change.  HENT: Negative for congestion, mouth sores and sinus pressure.   Eyes: Negative for visual disturbance.  Respiratory: Negative for cough and chest tightness.   Gastrointestinal: Negative for abdominal pain and nausea.  Genitourinary: Negative for difficulty urinating, frequency and vaginal pain.  Musculoskeletal: Positive for arthralgias, back pain and gait problem.  Skin: Negative for pallor and rash.  Neurological: Negative for dizziness, tremors, weakness, numbness and headaches.  Psychiatric/Behavioral: Negative for confusion and sleep disturbance.    Objective:  BP (!) 142/80 (BP Location: Right Arm, Patient Position: Sitting, Cuff Size: Large)   Pulse 72   Temp 97.9 F (36.6 C) (Oral)   Ht 5\' 4"  (1.626 m)   Wt 204 lb (92.5 kg)   SpO2 98%   BMI 35.02 kg/m   BP Readings from Last 3 Encounters:  11/05/17 (!) 142/80  10/29/17 130/80  08/06/17 120/70    Wt Readings from Last 3 Encounters:  11/05/17 204 lb (92.5 kg)  10/29/17 206 lb (93.4 kg)  06/19/17 205 lb (93 kg)    Physical Exam  Constitutional: She appears well-developed. No distress.  HENT:  Head: Normocephalic.  Right Ear: External ear normal.  Left Ear: External ear normal.  Nose: Nose normal.  Mouth/Throat: Oropharynx is clear and moist.  Eyes: Pupils are equal, round, and reactive to light. Conjunctivae are normal. Right eye exhibits no discharge. Left eye exhibits no discharge.  Neck: Normal range of motion. Neck supple. No JVD present. No tracheal deviation present. No thyromegaly present.  Cardiovascular: Normal rate, regular rhythm and normal heart sounds.  Pulmonary/Chest: No stridor. No respiratory distress. She has no wheezes.  Abdominal: Soft. Bowel sounds are normal. She exhibits no distension and no mass. There is no tenderness. There is no rebound and no  guarding.  Musculoskeletal: She exhibits tenderness. She exhibits no edema.  Lymphadenopathy:    She has no cervical adenopathy.  Neurological: She displays normal reflexes. No cranial nerve deficit. She exhibits normal muscle tone. Coordination normal.  Skin: No rash noted. No erythema.  Psychiatric: She has a normal mood and affect. Her behavior is normal. Judgment and thought content normal.  obese  Lab Results  Component Value Date   WBC 5.7 11/01/2017   HGB 11.0 (L) 11/01/2017   HCT 32.7 (L) 11/01/2017   PLT 266.0 11/01/2017   GLUCOSE 127 (H) 11/01/2017   CHOL 211 (H) 11/01/2017   TRIG 70.0 11/01/2017   HDL 60.70 11/01/2017   LDLDIRECT 146.5 09/18/2012   LDLCALC 136 (H) 11/01/2017   ALT 11 11/01/2017   AST 7 11/01/2017   NA 139 11/01/2017   K 4.6 11/01/2017   CL 105 11/01/2017   CREATININE 1.01 11/01/2017   BUN 17 11/01/2017   CO2 27 11/01/2017   TSH 2.17 11/01/2017   HGBA1C 6.0 (A) 10/29/2017   MICROALBUR 0.9 01/07/2017    Dg Knee 1-2 Views Right  Result Date: 06/20/2017 CLINICAL DATA:  Right knee pain and swelling for several days, no known injury, initial encounter EXAM: RIGHT KNEE - 2 VIEW COMPARISON:  None. FINDINGS: Tricompartmental degenerative changes are noted worst in the medial joint space. Multiple well calcified densities are identified in the suprapatellar bursa consistent with loose bodies. Some additional loose bodies are also noted in the posterior aspect of the joint. 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 No acute abnormality is noted. Electronically Signed   By: Katelyn Peterson M.D.   On: 06/20/2017 08:33    Assessment & Plan:   There are no diagnoses linked to this encounter.   No orders of the defined types were placed in this encounter.    Follow-up: No follow-ups on file.  Katelyn Kehr, MD

## 2017-11-05 NOTE — Assessment & Plan Note (Signed)
F/u Dr Cruzita Lederer Metformin, Januvia, Crestor

## 2017-11-05 NOTE — Assessment & Plan Note (Signed)
CBD oil for pain - topical or by mouth. Deep Roots

## 2017-11-12 DIAGNOSIS — Z1231 Encounter for screening mammogram for malignant neoplasm of breast: Secondary | ICD-10-CM | POA: Diagnosis not present

## 2017-11-12 LAB — HM MAMMOGRAPHY

## 2017-11-14 ENCOUNTER — Encounter: Payer: Self-pay | Admitting: Internal Medicine

## 2018-01-22 MED FILL — JANUVIA 100 MG TABLET: 100 | 90 days supply | Qty: 90 | Fill #1

## 2018-02-12 ENCOUNTER — Ambulatory Visit: Payer: 59 | Admitting: Internal Medicine

## 2018-02-12 ENCOUNTER — Encounter: Payer: Self-pay | Admitting: Internal Medicine

## 2018-02-12 DIAGNOSIS — I10 Essential (primary) hypertension: Secondary | ICD-10-CM

## 2018-02-12 DIAGNOSIS — Z6835 Body mass index (BMI) 35.0-35.9, adult: Secondary | ICD-10-CM

## 2018-02-12 DIAGNOSIS — E113592 Type 2 diabetes mellitus with proliferative diabetic retinopathy without macular edema, left eye: Secondary | ICD-10-CM

## 2018-02-12 DIAGNOSIS — E1169 Type 2 diabetes mellitus with other specified complication: Secondary | ICD-10-CM | POA: Diagnosis not present

## 2018-02-12 DIAGNOSIS — E785 Hyperlipidemia, unspecified: Secondary | ICD-10-CM | POA: Diagnosis not present

## 2018-02-12 NOTE — Assessment & Plan Note (Signed)
cardiac CT scan for calcium scoring option given

## 2018-02-12 NOTE — Patient Instructions (Signed)

## 2018-02-12 NOTE — Assessment & Plan Note (Signed)
On a low carb diet - loosing wt

## 2018-02-12 NOTE — Progress Notes (Signed)
Subjective:  Patient ID: Katelyn Peterson, female    DOB: 1945-05-11  Age: 72 y.o. MRN: [id number]  CC: No chief complaint on file.   HPI Katelyn Peterson presents for DM, HTN, OA f/u  Outpatient Medications Prior to Visit  Medication Sig Dispense Refill  . cetirizine (ZYRTEC) 10 MG tablet TAKE ONE TABLET BY MOUTH ONCE DAILY 70 tablet 5  . Cholecalciferol 1000 UNITS tablet Take 1,000 Units by mouth daily.      . Diclofenac Sodium 2 % SOLN Apply 1 pump twice daily. 112 g 3  . ibuprofen (ADVIL,MOTRIN) 600 MG tablet TAKE 1 TABLET BY MOUTH TWICE DAILY AFTER MEALS FOR 1 WEEK, THEN AS NEEDED FOR PAIN 60 tablet 2  . loratadine (CLARITIN) 10 MG tablet Take 1 tablet (10 mg total) by mouth daily. 90 tablet 0  . Menthol, Topical Analgesic, (BIOFREEZE) 4 % GEL Apply to affected area daily as needed 150 mL 0  . metFORMIN (GLUCOPHAGE) 1000 MG tablet Take 1 tablet (1,000 mg total) by mouth 2 (two) times daily with a meal. 180 tablet 3  . OVER THE COUNTER MEDICATION Goody Powder for headaches. PRN    . quinapril (ACCUPRIL) 40 MG tablet TAKE 1 TABLET BY MOUTH ONCE DAILY 90 tablet 3  . sitaGLIPtin (JANUVIA) 100 MG tablet Take 1 tablet (100 mg total) by mouth daily. ( In the AM) 90 tablet 3  . spironolactone (ALDACTONE) 50 MG tablet Take 1 tablet (50 mg total) daily by mouth. 90 tablet 3  . traMADol (ULTRAM) 50 MG tablet Take 1-2 tablets (50-100 mg total) by mouth 2 (two) times daily as needed for severe pain. 100 tablet 1  . verapamil (VERELAN PM) 240 MG 24 hr capsule TAKE 1 CAPSULE BY MOUTH AT BEDTIME. 90 capsule 2   Facility-Administered Medications Prior to Visit  Medication Dose Route Frequency Provider Last Rate Last Dose  . 0.9 %  sodium chloride infusion  500 mL Intravenous Continuous Milus Banister, MD        ROS: Review of Systems  Constitutional: Negative for activity change, appetite change, chills, fatigue and unexpected weight change.  HENT: Negative for congestion, mouth sores and  sinus pressure.   Eyes: Negative for visual disturbance.  Respiratory: Negative for cough and chest tightness.   Gastrointestinal: Negative for abdominal pain and nausea.  Genitourinary: Negative for difficulty urinating, frequency and vaginal pain.  Musculoskeletal: Positive for arthralgias, back pain and gait problem.  Skin: Negative for pallor and rash.  Neurological: Negative for dizziness, tremors, weakness, numbness and headaches.  Psychiatric/Behavioral: Negative for confusion, sleep disturbance and suicidal ideas.    Objective:  BP (!) 146/72 (BP Location: Left Arm, Patient Position: Sitting, Cuff Size: Large)   Pulse 77   Temp 98.3 F (36.8 C) (Oral)   Ht 5\' 4"  (1.626 m)   Wt 199 lb (90.3 kg)   SpO2 95%   BMI 34.16 kg/m   BP Readings from Last 3 Encounters:  02/12/18 (!) 146/72  11/05/17 (!) 142/80  10/29/17 130/80    Wt Readings from Last 3 Encounters:  02/12/18 199 lb (90.3 kg)  11/05/17 204 lb (92.5 kg)  10/29/17 206 lb (93.4 kg)    Physical Exam  Constitutional: She appears well-developed. No distress.  HENT:  Head: Normocephalic.  Right Ear: External ear normal.  Left Ear: External ear normal.  Nose: Nose normal.  Mouth/Throat: Oropharynx is clear and moist.  Eyes: Pupils are equal, round, and reactive to light. Conjunctivae are normal.  Right eye exhibits no discharge. Left eye exhibits no discharge.  Neck: Normal range of motion. Neck supple. No JVD present. No tracheal deviation present. No thyromegaly present.  Cardiovascular: Normal rate, regular rhythm and normal heart sounds.  Pulmonary/Chest: No stridor. No respiratory distress. She has no wheezes.  Abdominal: Soft. Bowel sounds are normal. She exhibits no distension and no mass. There is tenderness. There is no rebound and no guarding.  Musculoskeletal: She exhibits no edema or tenderness.  Lymphadenopathy:    She has no cervical adenopathy.  Neurological: She displays normal reflexes. No  cranial nerve deficit. She exhibits normal muscle tone. Coordination normal.  Skin: No rash noted. No erythema.  Psychiatric: She has a normal mood and affect. Her behavior is normal. Judgment and thought content normal.   R knee w/pain   Lab Results  Component Value Date   WBC 5.7 11/01/2017   HGB 11.0 (L) 11/01/2017   HCT 32.7 (L) 11/01/2017   PLT 266.0 11/01/2017   GLUCOSE 127 (H) 11/01/2017   CHOL 211 (H) 11/01/2017   TRIG 70.0 11/01/2017   HDL 60.70 11/01/2017   LDLDIRECT 146.5 09/18/2012   LDLCALC 136 (H) 11/01/2017   ALT 11 11/01/2017   AST 7 11/01/2017   NA 139 11/01/2017   K 4.6 11/01/2017   CL 105 11/01/2017   CREATININE 1.01 11/01/2017   BUN 17 11/01/2017   CO2 27 11/01/2017   TSH 2.17 11/01/2017   HGBA1C 6.0 (A) 10/29/2017   MICROALBUR 0.9 01/07/2017    Dg Knee 1-2 Views Right  Result Date: 06/20/2017 CLINICAL DATA:  Right knee pain and swelling for several days, no known injury, initial encounter EXAM: RIGHT KNEE - 2 VIEW COMPARISON:  None. FINDINGS: Tricompartmental degenerative changes are noted worst in the medial joint space. Multiple well calcified densities are identified in the suprapatellar bursa consistent with loose bodies. Some additional loose bodies are also noted in the posterior aspect of the joint. 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 No acute abnormality is noted. Electronically Signed   By: Inez Catalina M.D.   On: 06/20/2017 08:33    Assessment & Plan:   There are no diagnoses linked to this encounter.   No orders of the defined types were placed in this encounter.    Follow-up: No follow-ups on file.  Walker Kehr, MD

## 2018-02-13 ENCOUNTER — Other Ambulatory Visit (INDEPENDENT_AMBULATORY_CARE_PROVIDER_SITE_OTHER): Payer: 59

## 2018-02-13 DIAGNOSIS — E113592 Type 2 diabetes mellitus with proliferative diabetic retinopathy without macular edema, left eye: Secondary | ICD-10-CM

## 2018-02-13 DIAGNOSIS — Z0000 Encounter for general adult medical examination without abnormal findings: Secondary | ICD-10-CM | POA: Diagnosis not present

## 2018-02-13 DIAGNOSIS — I10 Essential (primary) hypertension: Secondary | ICD-10-CM

## 2018-02-13 LAB — CBC WITH DIFFERENTIAL/PLATELET
Basophils Absolute: 0.1 10*3/uL (ref 0.0–0.1)
Basophils Relative: 0.7 % (ref 0.0–3.0)
EOS PCT: 1 % (ref 0.0–5.0)
Eosinophils Absolute: 0.1 10*3/uL (ref 0.0–0.7)
HCT: 33.3 % — ABNORMAL LOW (ref 36.0–46.0)
Hemoglobin: 11.1 g/dL — ABNORMAL LOW (ref 12.0–15.0)
Lymphocytes Relative: 22.6 % (ref 12.0–46.0)
Lymphs Abs: 1.7 10*3/uL (ref 0.7–4.0)
MCHC: 33.5 g/dL (ref 30.0–36.0)
MCV: 90.3 fl (ref 78.0–100.0)
Monocytes Absolute: 0.4 10*3/uL (ref 0.1–1.0)
Monocytes Relative: 5.8 % (ref 3.0–12.0)
NEUTROS ABS: 5.3 10*3/uL (ref 1.4–7.7)
Neutrophils Relative %: 69.9 % (ref 43.0–77.0)
PLATELETS: 268 10*3/uL (ref 150.0–400.0)
RBC: 3.68 Mil/uL — ABNORMAL LOW (ref 3.87–5.11)
RDW: 13.6 % (ref 11.5–15.5)
WBC: 7.6 10*3/uL (ref 4.0–10.5)

## 2018-02-13 LAB — BASIC METABOLIC PANEL
BUN: 22 mg/dL (ref 6–23)
CHLORIDE: 108 meq/L (ref 96–112)
CO2: 25 meq/L (ref 19–32)
Calcium: 9.4 mg/dL (ref 8.4–10.5)
Creatinine, Ser: 1.14 mg/dL (ref 0.40–1.20)
GFR: 60.27 mL/min (ref >60.00)
Glucose, Bld: 85 mg/dL (ref 70–99)
Potassium: 5.1 mEq/L (ref 3.5–5.1)
SODIUM: 140 meq/L (ref 135–145)

## 2018-02-13 LAB — HEPATIC FUNCTION PANEL
ALT: 11 U/L (ref 0–35)
AST: 8 U/L (ref 0–37)
Albumin: 4 g/dL (ref 3.5–5.2)
Alkaline Phosphatase: 68 U/L (ref 39–117)
Bilirubin, Direct: 0.1 mg/dL (ref 0.0–0.3)
Total Bilirubin: 0.4 mg/dL (ref 0.2–1.2)
Total Protein: 7 g/dL (ref 6.0–8.3)

## 2018-02-13 LAB — LIPID PANEL
Cholesterol: 214 mg/dL — ABNORMAL HIGH (ref 0–200)
HDL: 52.7 mg/dL (ref >39.00)
LDL Cholesterol: 144 mg/dL — ABNORMAL HIGH (ref 0–99)
NONHDL: 161.34
Total CHOL/HDL Ratio: 4
Triglycerides: 88 mg/dL (ref 0.0–149.0)
VLDL: 17.6 mg/dL (ref 0.0–40.0)

## 2018-02-13 LAB — TSH: TSH: 1.07 u[IU]/mL (ref 0.35–4.50)

## 2018-02-19 ENCOUNTER — Telehealth: Payer: Self-pay | Admitting: Internal Medicine

## 2018-02-19 NOTE — Telephone Encounter (Signed)
Reviewed lab results and physician's note with patient. She is not interested in taking cholesterol medication at this time. She will monitor her diet.

## 2018-03-10 ENCOUNTER — Other Ambulatory Visit: Payer: Self-pay | Admitting: Internal Medicine

## 2018-03-10 MED FILL — VERAPAMIL ER 240 MG CAPSULE: 240 | 90 days supply | Qty: 90 | Fill #0

## 2018-03-28 DIAGNOSIS — M1711 Unilateral primary osteoarthritis, right knee: Secondary | ICD-10-CM | POA: Diagnosis not present

## 2018-04-03 MED FILL — QUINAPRIL 40 MG TABLET: 40 | 90 days supply | Qty: 90 | Fill #1

## 2018-04-14 ENCOUNTER — Other Ambulatory Visit: Payer: Self-pay | Admitting: Internal Medicine

## 2018-04-14 MED FILL — SPIRONOLACTONE 50 MG TABS: 50 | 90 days supply | Qty: 90 | Fill #0

## 2018-05-01 ENCOUNTER — Ambulatory Visit: Payer: 59 | Admitting: Internal Medicine

## 2018-05-02 ENCOUNTER — Encounter: Payer: Self-pay | Admitting: Internal Medicine

## 2018-05-02 ENCOUNTER — Ambulatory Visit: Payer: 59 | Admitting: Internal Medicine

## 2018-05-02 VITALS — BP 130/70 | HR 66 | Ht 64.0 in | Wt 196.0 lb

## 2018-05-02 DIAGNOSIS — E1169 Type 2 diabetes mellitus with other specified complication: Secondary | ICD-10-CM

## 2018-05-02 DIAGNOSIS — E785 Hyperlipidemia, unspecified: Secondary | ICD-10-CM

## 2018-05-02 DIAGNOSIS — E049 Nontoxic goiter, unspecified: Secondary | ICD-10-CM | POA: Diagnosis not present

## 2018-05-02 DIAGNOSIS — E113592 Type 2 diabetes mellitus with proliferative diabetic retinopathy without macular edema, left eye: Secondary | ICD-10-CM | POA: Diagnosis not present

## 2018-05-02 LAB — POCT GLYCOSYLATED HEMOGLOBIN (HGB A1C): Hemoglobin A1C: 6.2 % — AB (ref 4.0–5.6)

## 2018-05-02 NOTE — Progress Notes (Signed)
Patient ID: Katelyn Peterson, female   DOB: 06/21/1945, 73 y.o.   MRN: [id number]  HPI: Katelyn Peterson is a 73 y.o.-year-old female, returning for f/u for DM2, dx 2000, non-insulin-dependent, controlled, with complications (mild PDR in [facility]). Last visit 6 months ago.  Last hemoglobin A1c was: Lab Results  Component Value Date   HGBA1C 6.0 (A) 10/29/2017   HGBA1C 6.3 04/23/2017   HGBA1C 6.5 01/07/2017   Pt is on a regimen of: - Metformin 1000 mg 2x a day - Januvia 100 mg daily in am We stopped Amaryl 2 mg bid and Actos 30 mg daily for daily hypoglycemia events and possible SEs, respectively.  Pt checks her sugars 1x a day, in the morning: - am:  87-114, 124 >> 80s-110 >> 82-110, 125 >> 87-120 - 2h after b'fast: n/c >> 131 >> n/c - before lunch: n/c >> 93 >> 100-110 >> n/c - 2h after lunch: 1n/c>> 110, 121 >> n/c - before dinner: 105, 115 >> n/c >> 102 >> n/c - 2h after dinner: 135, 135 >> n/c - bedtime:   90 >> 70 >> n/c Lowest sugar was 80 >> 82 >> 87;  she has hypoglycemia awareness in the 80s. Highest sugar was 120s >> 125 >> 120.  -No CKD, last BUN/creatinine:  Lab Results  Component Value Date   BUN 22 02/13/2018   CREATININE 1.14 02/13/2018  On Quinapril 40.  Last ACR normal: Lab Results  Component Value Date   MICRALBCREAT 1.5 01/07/2017   MICRALBCREAT 0.9 05/08/2016   -+ HL; last set of lipids: Lab Results  Component Value Date   CHOL 214 (H) 02/13/2018   HDL 52.70 02/13/2018   LDLCALC 144 (H) 02/13/2018   LDLDIRECT 146.5 09/18/2012   TRIG 88.0 02/13/2018   CHOLHDL 4 02/13/2018  She developed leg cramps with rosuvastatin, pravastatin and atorvastatin.  We also tried fluvastatin XL but this still caused leg cramps.  - last eye exam was in 07/2017: + DR. Dr. Edyth Gunnels.  -She denies numbness and tingling in her feet.  She also has a history of HTN, obesity. She has been considered for lap band surgery >> she did not want to follow through with thiss. She  also has bilateral chronic lower extremity cramps, right knee pain, anemia.  Last TSH normal: Lab Results  Component Value Date   TSH 1.07 02/13/2018   ROS: Constitutional: no weight gain/no weight loss, no fatigue, no subjective hyperthermia, no subjective hypothermia Eyes: no blurry vision, no xerophthalmia ENT: no sore throat, no nodules palpated in neck, no dysphagia, no odynophagia, no hoarseness Cardiovascular: no CP/no SOB/no palpitations/no leg swelling Respiratory: no cough/no SOB/no wheezing Gastrointestinal: no N/no V/no D/no C/no acid reflux Musculoskeletal: no muscle aches/no joint aches Skin: no rashes, no hair loss Neurological: no tremors/no numbness/no tingling/no dizziness  I reviewed pt's medications, allergies, PMH, social hx, family hx, and changes were documented in the history of present illness. Otherwise, unchanged from my initial visit note.  Past Medical History:  Diagnosis Date  . Arthritis   . HTN (hypertension)   . Hyperlipidemia   . Obesity   . Type II or unspecified type diabetes mellitus without mention of complication, not stated as uncontrolled    Past Surgical History:  Procedure Laterality Date  . ABDOMINAL HYSTERECTOMY     Social History   Socioeconomic History  . Marital status: Married    Spouse name: Not on file  . Number of children: Not on file  . Years  of education: Not on file  . Highest education level: Not on file  Occupational History  . Occupation: Public house manager  Social Needs  . Financial resource strain: Not on file  . Food insecurity:    Worry: Not on file    Inability: Not on file  . Transportation needs:    Medical: Not on file    Non-medical: Not on file  Tobacco Use  . Smoking status: Never Smoker  . Smokeless tobacco: Never Used  Substance and Sexual Activity  . Alcohol use: No  . Drug use: No  . Sexual activity: Yes    Partners: Male  Lifestyle  . Physical activity:    Days per week: Not on file     Minutes per session: Not on file  . Stress: Not on file  Relationships  . Social connections:    Talks on phone: Not on file    Gets together: Not on file    Attends religious service: Not on file    Active member of club or organization: Not on file    Attends meetings of clubs or organizations: Not on file    Relationship status: Not on file  . Intimate partner violence:    Fear of current or ex partner: Not on file    Emotionally abused: Not on file    Physically abused: Not on file    Forced sexual activity: Not on file  Other Topics Concern  . Not on file  Social History Narrative   Regular exercise: seldom   Caffeine use: occasionally         Current Outpatient Medications on File Prior to Visit  Medication Sig Dispense Refill  . cetirizine (ZYRTEC) 10 MG tablet TAKE ONE TABLET BY MOUTH ONCE DAILY 70 tablet 5  . Cholecalciferol 1000 UNITS tablet Take 1,000 Units by mouth daily.      . Diclofenac Sodium 2 % SOLN Apply 1 pump twice daily. 112 g 3  . ibuprofen (ADVIL,MOTRIN) 600 MG tablet TAKE 1 TABLET BY MOUTH TWICE DAILY AFTER MEALS FOR 1 WEEK, THEN AS NEEDED FOR PAIN 60 tablet 2  . loratadine (CLARITIN) 10 MG tablet Take 1 tablet (10 mg total) by mouth daily. 90 tablet 0  . Menthol, Topical Analgesic, (BIOFREEZE) 4 % GEL Apply to affected area daily as needed 150 mL 0  . metFORMIN (GLUCOPHAGE) 1000 MG tablet Take 1 tablet (1,000 mg total) by mouth 2 (two) times daily with a meal. 180 tablet 3  . OVER THE COUNTER MEDICATION Goody Powder for headaches. PRN    . quinapril (ACCUPRIL) 40 MG tablet TAKE 1 TABLET BY MOUTH ONCE DAILY 90 tablet 3  . sitaGLIPtin (JANUVIA) 100 MG tablet Take 1 tablet (100 mg total) by mouth daily. ( In the AM) 90 tablet 3  . spironolactone (ALDACTONE) 50 MG tablet TAKE 1 TABLET (50 MG TOTAL) BY MOUTH DAILY 90 tablet 3  . traMADol (ULTRAM) 50 MG tablet Take 1-2 tablets (50-100 mg total) by mouth 2 (two) times daily as needed for severe pain. 100  tablet 1  . verapamil (VERELAN PM) 240 MG 24 hr capsule TAKE 1 CAPSULE BY MOUTH AT BEDTIME. 90 capsule 1   Current Facility-Administered Medications on File Prior to Visit  Medication Dose Route Frequency Provider Last Rate Last Dose  . 0.9 %  sodium chloride infusion  500 mL Intravenous Continuous Milus Banister, MD       Allergies  Allergen Reactions  . Atorvastatin  REACTION: cramps  . Hydrochlorothiazide     REACTION: Cramps  . Pravastatin     cramps   Family History  Problem Relation Age of Onset  . Hypertension Mother   . Diabetes Mother   . Hypertension Father   . Hypertension Other   . Colon cancer Neg Hx   . Esophageal cancer Neg Hx   . Rectal cancer Neg Hx   . Stomach cancer Neg Hx      PE: BP 130/70   Pulse 66   Ht 5\' 4"  (1.626 m)   Wt 196 lb (88.9 kg)   SpO2 98%   BMI 33.64 kg/m  Body mass index is 33.64 kg/m.  Wt Readings from Last 3 Encounters:  02/12/18 199 lb (90.3 kg)  11/05/17 204 lb (92.5 kg)  10/29/17 206 lb (93.4 kg)   Constitutional: overweight, in NAD Eyes: PERRLA, EOMI, no exophthalmos ENT: moist mucous membranes, no thyromegaly, no cervical lymphadenopathy Cardiovascular: RRR, No MRG Respiratory: CTA B Gastrointestinal: abdomen soft, NT, ND, BS+ Musculoskeletal: no deformities, strength intact in all 4 Skin: moist, warm, no rashes Neurological: no tremor with outstretched hands, DTR normal in all 4  ASSESSMENT: 1. 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 Hyperlipidemia - Developed leg cramps with pravastatin, atorvastatin, Crestor, fluvastatin  3. Goiter by palpation 11/04/2012: Thyroid U/S:  Right thyroid lobe: 4.3 x 1.0 x 1.8 cm   Left thyroid lobe: 4.6 x 1.2 x 1.9 cm   Isthmus: 5 mm   Focal nodules: Thyroid echotexture is homogeneous, without solid or cystic lesion.   Lymphadenopathy: None visualized.  IMPRESSION:  Normal thyroid ultrasound. No evidence of thyromegaly or dominant  solid/cystic mass.  PLAN:  1. DM2 Patient with longstanding, well-controlled, type 2 diabetes, on oral antidiabetic regimen, with good control.  She continues to have no hypoglycemic or hyperglycemic spikes.  Sugars remain at goal in the morning but she unfortunately still does not check sugars later in the day.  I again strongly advised her to start rotating the checks. - She has no side effects from metformin and Januvia.  We will continue these for now. - At last visit HbA1c was great, at 6.0% - I suggested to:  Patient Instructions  Please continue: - Metformin 1000 mg 2x a day - Januvia 100 mg daily in am  Think about trying Zetia and let me know.   Please come back for a follow-up appointment in 6 months.  - today, HbA1c is 6.2% (slightly higher) - continue checking sugars at different times of the day - check 1x a day, rotating checks - advised for yearly eye exams >> she is UTD - Return to clinic in 6 mo with sugar log   2. Hyperlipidemia - Reviewed latest lipid panel from 02/2018: Total cholesterol was high, as was her LDL cholesterol Lab Results  Component Value Date   CHOL 214 (H) 02/13/2018   HDL 52.70 02/13/2018   LDLCALC 144 (H) 02/13/2018   LDLDIRECT 146.5 09/18/2012   TRIG 88.0 02/13/2018   CHOLHDL 4 02/13/2018  -She had leg cramps from Crestor 5, pravastatin, atorvastatin, fluvastatin XL in the past.  She would not want to retry another statin. -At this visit, I suggested ezetimibe, but may be a candidate for PCSK9 inhibitors.  She is not excited about visiting my but I advised her to let me know if she decides for it so I can send it to her pharmacy.  3. Non-nodular Goiter -Denies neck compression symptoms -Reviewed her  thyroid ultrasound report from 2014: No nodules -We will continue to follow her clinically  Philemon Kingdom, MD PhD [facility]

## 2018-05-02 NOTE — Addendum Note (Signed)
Addended by: Cardell Peach I on: 05/02/2018 03:11 PM   Modules accepted: Orders

## 2018-05-02 NOTE — Patient Instructions (Addendum)
Please continue: - Metformin 1000 mg 2x a day - Januvia 100 mg daily in am  Think about trying Zetia and let me know.   Please come back for a follow-up appointment in 6 months.

## 2018-05-13 ENCOUNTER — Other Ambulatory Visit: Payer: Self-pay | Admitting: Internal Medicine

## 2018-05-13 MED FILL — JANUVIA 100 MG TABLET: 100 | 90 days supply | Qty: 90 | Fill #0

## 2018-05-20 ENCOUNTER — Encounter: Payer: 59 | Admitting: Internal Medicine

## 2018-06-10 ENCOUNTER — Ambulatory Visit (INDEPENDENT_AMBULATORY_CARE_PROVIDER_SITE_OTHER): Payer: 59 | Admitting: Internal Medicine

## 2018-06-10 ENCOUNTER — Other Ambulatory Visit: Payer: Self-pay

## 2018-06-10 ENCOUNTER — Encounter: Payer: Self-pay | Admitting: Internal Medicine

## 2018-06-10 ENCOUNTER — Other Ambulatory Visit: Payer: 59

## 2018-06-10 VITALS — BP 128/72 | HR 77 | Temp 97.7°F | Ht 64.0 in | Wt 197.1 lb

## 2018-06-10 DIAGNOSIS — Z0000 Encounter for general adult medical examination without abnormal findings: Secondary | ICD-10-CM

## 2018-06-10 DIAGNOSIS — E785 Hyperlipidemia, unspecified: Secondary | ICD-10-CM | POA: Diagnosis not present

## 2018-06-10 DIAGNOSIS — E113592 Type 2 diabetes mellitus with proliferative diabetic retinopathy without macular edema, left eye: Secondary | ICD-10-CM

## 2018-06-10 DIAGNOSIS — I10 Essential (primary) hypertension: Secondary | ICD-10-CM | POA: Diagnosis not present

## 2018-06-10 DIAGNOSIS — E1169 Type 2 diabetes mellitus with other specified complication: Secondary | ICD-10-CM | POA: Diagnosis not present

## 2018-06-10 LAB — URINALYSIS, ROUTINE W REFLEX MICROSCOPIC
Bilirubin Urine: NEGATIVE
Hgb urine dipstick: NEGATIVE
Ketones, ur: NEGATIVE
Leukocytes,Ua: NEGATIVE
Nitrite: POSITIVE — AB
RBC / HPF: NONE SEEN (ref 0–?)
Specific Gravity, Urine: 1.025 (ref 1.000–1.030)
Total Protein, Urine: NEGATIVE
Urine Glucose: NEGATIVE
Urobilinogen, UA: 0.2 (ref 0.0–1.0)
pH: 6 (ref 5.0–8.0)

## 2018-06-10 LAB — CBC WITH DIFFERENTIAL/PLATELET
Basophils Absolute: 0 10*3/uL (ref 0.0–0.1)
Basophils Relative: 0.7 % (ref 0.0–3.0)
Eosinophils Absolute: 0.1 10*3/uL (ref 0.0–0.7)
Eosinophils Relative: 1.5 % (ref 0.0–5.0)
HEMATOCRIT: 32.8 % — AB (ref 36.0–46.0)
Hemoglobin: 10.8 g/dL — ABNORMAL LOW (ref 12.0–15.0)
Lymphocytes Relative: 27.4 % (ref 12.0–46.0)
Lymphs Abs: 1.6 10*3/uL (ref 0.7–4.0)
MCHC: 33 g/dL (ref 30.0–36.0)
MCV: 90.7 fl (ref 78.0–100.0)
MONOS PCT: 5.7 % (ref 3.0–12.0)
Monocytes Absolute: 0.3 10*3/uL (ref 0.1–1.0)
Neutro Abs: 3.8 10*3/uL (ref 1.4–7.7)
Neutrophils Relative %: 64.7 % (ref 43.0–77.0)
Platelets: 259 10*3/uL (ref 150.0–400.0)
RBC: 3.61 Mil/uL — ABNORMAL LOW (ref 3.87–5.11)
RDW: 14 % (ref 11.5–15.5)
WBC: 5.8 10*3/uL (ref 4.0–10.5)

## 2018-06-10 LAB — HEPATIC FUNCTION PANEL
ALT: 9 U/L (ref 0–35)
AST: 9 U/L (ref 0–37)
Albumin: 4 g/dL (ref 3.5–5.2)
Alkaline Phosphatase: 87 U/L (ref 39–117)
Bilirubin, Direct: 0.1 mg/dL (ref 0.0–0.3)
Total Bilirubin: 0.3 mg/dL (ref 0.2–1.2)
Total Protein: 7 g/dL (ref 6.0–8.3)

## 2018-06-10 LAB — BASIC METABOLIC PANEL
BUN: 22 mg/dL (ref 6–23)
CALCIUM: 9 mg/dL (ref 8.4–10.5)
CO2: 25 mEq/L (ref 19–32)
Chloride: 106 mEq/L (ref 96–112)
Creatinine, Ser: 1.05 mg/dL (ref 0.40–1.20)
GFR: 62.29 mL/min (ref >60.00)
Glucose, Bld: 82 mg/dL (ref 70–99)
Potassium: 4.8 mEq/L (ref 3.5–5.1)
Sodium: 139 mEq/L (ref 135–145)

## 2018-06-10 LAB — LIPID PANEL
Cholesterol: 232 mg/dL — ABNORMAL HIGH (ref 0–200)
HDL: 62 mg/dL (ref >39.00)
LDL Cholesterol: 158 mg/dL — ABNORMAL HIGH (ref 0–99)
NonHDL: 169.75
Total CHOL/HDL Ratio: 4
Triglycerides: 60 mg/dL (ref 0.0–149.0)
VLDL: 12 mg/dL (ref 0.0–40.0)

## 2018-06-10 LAB — MICROALBUMIN / CREATININE URINE RATIO
Creatinine,U: 118.6 mg/dL
Microalb Creat Ratio: 0.9 mg/g (ref 0.0–30.0)
Microalb, Ur: 1.1 mg/dL (ref 0.0–1.9)

## 2018-06-10 LAB — TSH: TSH: 1.62 u[IU]/mL (ref 0.35–4.50)

## 2018-06-10 NOTE — Assessment & Plan Note (Signed)
A cardiac CT scan for calcium scoring offered 12/19 Crestor

## 2018-06-10 NOTE — Assessment & Plan Note (Addendum)
We discussed age appropriate health related issues, including available/recomended screening tests and vaccinations. We discussed a need for adhering to healthy diet and exercise. Labs were ordered to be later reviewed . All questions were answered. Colon 08/2016 A cardiac CT scan for calcium scoring offered 12/19

## 2018-06-10 NOTE — Addendum Note (Signed)
Addended by: Elmer Picker on: 06/10/2018 09:50 AM   Modules accepted: Orders

## 2018-06-10 NOTE — Progress Notes (Signed)
Subjective:  Patient ID: Katelyn Peterson, female    DOB: 1945/11/19  Age: 73 y.o. MRN: [id number]  CC: No chief complaint on file.   HPI Katelyn Peterson presents for a well exam F/u DM Outpatient Medications Prior to Visit  Medication Sig Dispense Refill  . cetirizine (ZYRTEC) 10 MG tablet TAKE ONE TABLET BY MOUTH ONCE DAILY 70 tablet 5  . Cholecalciferol 1000 UNITS tablet Take 1,000 Units by mouth daily.      . Diclofenac Sodium 2 % SOLN Apply 1 pump twice daily. 112 g 3  . ibuprofen (ADVIL,MOTRIN) 600 MG tablet TAKE 1 TABLET BY MOUTH TWICE DAILY AFTER MEALS FOR 1 WEEK, THEN AS NEEDED FOR PAIN 60 tablet 2  . JANUVIA 100 MG tablet TAKE 1 TABLET BY MOUTH ONCE DAILY IN THE MORNING 90 tablet 3  . loratadine (CLARITIN) 10 MG tablet Take 1 tablet (10 mg total) by mouth daily. 90 tablet 0  . Menthol, Topical Analgesic, (BIOFREEZE) 4 % GEL Apply to affected area daily as needed 150 mL 0  . metFORMIN (GLUCOPHAGE) 1000 MG tablet Take 1 tablet (1,000 mg total) by mouth 2 (two) times daily with a meal. 180 tablet 3  . OVER THE COUNTER MEDICATION Goody Powder for headaches. PRN    . quinapril (ACCUPRIL) 40 MG tablet TAKE 1 TABLET BY MOUTH ONCE DAILY 90 tablet 3  . spironolactone (ALDACTONE) 50 MG tablet TAKE 1 TABLET (50 MG TOTAL) BY MOUTH DAILY 90 tablet 3  . traMADol (ULTRAM) 50 MG tablet Take 1-2 tablets (50-100 mg total) by mouth 2 (two) times daily as needed for severe pain. 100 tablet 1  . verapamil (VERELAN PM) 240 MG 24 hr capsule TAKE 1 CAPSULE BY MOUTH AT BEDTIME. 90 capsule 1   Facility-Administered Medications Prior to Visit  Medication Dose Route Frequency Provider Last Rate Last Dose  . 0.9 %  sodium chloride infusion  500 mL Intravenous Continuous Milus Banister, MD        ROS: Review of Systems  Constitutional: Negative for activity change, appetite change, chills, fatigue and unexpected weight change.  HENT: Negative for congestion, mouth sores and sinus pressure.    Eyes: Negative for visual disturbance.  Respiratory: Negative for cough and chest tightness.   Gastrointestinal: Negative for abdominal pain and nausea.  Genitourinary: Negative for difficulty urinating, frequency and vaginal pain.  Musculoskeletal: Positive for arthralgias. Negative for back pain and gait problem.  Skin: Negative for pallor and rash.  Neurological: Negative for dizziness, tremors, weakness, numbness and headaches.  Psychiatric/Behavioral: Negative for confusion and sleep disturbance.    Objective:  BP 128/72 (BP Location: Left Arm, Patient Position: Sitting, Cuff Size: Normal)   Pulse 77   Temp 97.7 F (36.5 C) (Oral)   Ht 5\' 4"  (1.626 m)   Wt 197 lb 1.9 oz (89.4 kg)   SpO2 97%   BMI 33.84 kg/m   BP Readings from Last 3 Encounters:  06/10/18 128/72  05/02/18 130/70  02/12/18 (!) 146/72    Wt Readings from Last 3 Encounters:  06/10/18 197 lb 1.9 oz (89.4 kg)  05/02/18 196 lb (88.9 kg)  02/12/18 199 lb (90.3 kg)    Physical Exam Constitutional:      General: She is not in acute distress.    Appearance: She is well-developed.  HENT:     Head: Normocephalic.     Right Ear: External ear normal.     Left Ear: External ear normal.  Nose: Nose normal.  Eyes:     General:        Right eye: No discharge.        Left eye: No discharge.     Conjunctiva/sclera: Conjunctivae normal.     Pupils: Pupils are equal, round, and reactive to light.  Neck:     Musculoskeletal: Normal range of motion and neck supple.     Thyroid: No thyromegaly.     Vascular: No JVD.     Trachea: No tracheal deviation.  Cardiovascular:     Rate and Rhythm: Normal rate and regular rhythm.     Heart sounds: Normal heart sounds.  Pulmonary:     Effort: No respiratory distress.     Breath sounds: No stridor. No wheezing.  Abdominal:     General: Bowel sounds are normal. There is no distension.     Palpations: Abdomen is soft. There is no mass.     Tenderness: There is no  abdominal tenderness. There is no guarding or rebound.  Musculoskeletal:        General: No tenderness.  Lymphadenopathy:     Cervical: No cervical adenopathy.  Skin:    Findings: No erythema or rash.  Neurological:     Cranial Nerves: No cranial nerve deficit.     Motor: No abnormal muscle tone.     Coordination: Coordination normal.     Deep Tendon Reflexes: Reflexes normal.  Psychiatric:        Behavior: Behavior normal.        Thought Content: Thought content normal.        Judgment: Judgment normal.   obese Limping  Lab Results  Component Value Date   WBC 7.6 02/13/2018   HGB 11.1 (L) 02/13/2018   HCT 33.3 (L) 02/13/2018   PLT 268.0 02/13/2018   GLUCOSE 85 02/13/2018   CHOL 214 (H) 02/13/2018   TRIG 88.0 02/13/2018   HDL 52.70 02/13/2018   LDLDIRECT 146.5 09/18/2012   LDLCALC 144 (H) 02/13/2018   ALT 11 02/13/2018   AST 8 02/13/2018   NA 140 02/13/2018   K 5.1 02/13/2018   CL 108 02/13/2018   CREATININE 1.14 02/13/2018   BUN 22 02/13/2018   CO2 25 02/13/2018   TSH 1.07 02/13/2018   HGBA1C 6.2 (A) 05/02/2018   MICROALBUR 0.9 01/07/2017    Dg Knee 1-2 Views Right  Result Date: 06/20/2017 CLINICAL DATA:  Right knee pain and swelling for several days, no known injury, initial encounter EXAM: RIGHT KNEE - 2 VIEW COMPARISON:  None. FINDINGS: Tricompartmental degenerative changes are noted worst in the medial joint space. Multiple well calcified densities are identified in the suprapatellar bursa consistent with loose bodies. Some additional loose bodies are also noted in the posterior aspect of the joint. 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 No acute abnormality is noted. Electronically Signed   By: Katelyn Peterson M.D.   On: 06/20/2017 08:33    Assessment & Plan:   There are no diagnoses linked to this encounter.   No orders of the defined types were placed in this encounter.    Follow-up: No follow-ups on file.   Walker Kehr, MD

## 2018-06-10 NOTE — Assessment & Plan Note (Signed)
A1c was good 1 mo ago

## 2018-07-30 MED FILL — SPIRONOLACTONE 50 MG TABS: 50 | 90 days supply | Qty: 90 | Fill #1

## 2018-07-30 MED FILL — QUINAPRIL 40 MG TABLET: 40 | 90 days supply | Qty: 90 | Fill #2

## 2018-08-11 DIAGNOSIS — H52203 Unspecified astigmatism, bilateral: Secondary | ICD-10-CM | POA: Diagnosis not present

## 2018-08-11 DIAGNOSIS — H2513 Age-related nuclear cataract, bilateral: Secondary | ICD-10-CM | POA: Diagnosis not present

## 2018-08-11 DIAGNOSIS — Z7984 Long term (current) use of oral hypoglycemic drugs: Secondary | ICD-10-CM | POA: Diagnosis not present

## 2018-08-11 DIAGNOSIS — H25043 Posterior subcapsular polar age-related cataract, bilateral: Secondary | ICD-10-CM | POA: Diagnosis not present

## 2018-08-11 DIAGNOSIS — H524 Presbyopia: Secondary | ICD-10-CM | POA: Diagnosis not present

## 2018-08-11 DIAGNOSIS — H5203 Hypermetropia, bilateral: Secondary | ICD-10-CM | POA: Diagnosis not present

## 2018-08-11 DIAGNOSIS — H25013 Cortical age-related cataract, bilateral: Secondary | ICD-10-CM | POA: Diagnosis not present

## 2018-08-11 DIAGNOSIS — E119 Type 2 diabetes mellitus without complications: Secondary | ICD-10-CM | POA: Diagnosis not present

## 2018-08-11 DIAGNOSIS — E113293 Type 2 diabetes mellitus with mild nonproliferative diabetic retinopathy without macular edema, bilateral: Secondary | ICD-10-CM | POA: Diagnosis not present

## 2018-08-25 MED FILL — JANUVIA 100 MG TABLET: 100 | 90 days supply | Qty: 90 | Fill #1

## 2018-08-25 MED FILL — VERAPAMIL ER 240 MG CAPSULE: 240 | 90 days supply | Qty: 90 | Fill #1

## 2018-10-17 DIAGNOSIS — M1711 Unilateral primary osteoarthritis, right knee: Secondary | ICD-10-CM | POA: Diagnosis not present

## 2018-10-17 DIAGNOSIS — M1712 Unilateral primary osteoarthritis, left knee: Secondary | ICD-10-CM | POA: Diagnosis not present

## 2018-10-17 DIAGNOSIS — M170 Bilateral primary osteoarthritis of knee: Secondary | ICD-10-CM | POA: Diagnosis not present

## 2018-10-17 DIAGNOSIS — M25561 Pain in right knee: Secondary | ICD-10-CM | POA: Diagnosis not present

## 2018-10-28 ENCOUNTER — Other Ambulatory Visit: Payer: Self-pay | Admitting: Internal Medicine

## 2018-10-28 MED FILL — metFORMIN HCL 1000 MG TABS: 1000 | 90 days supply | Qty: 180 | Fill #0

## 2018-10-28 MED FILL — QUINAPRIL 40 MG TABLET: 40 | 90 days supply | Qty: 90 | Fill #3

## 2018-10-28 MED FILL — SPIRONOLACTONE 50 MG TABS: 50 | 90 days supply | Qty: 90 | Fill #2

## 2018-10-28 NOTE — Telephone Encounter (Signed)
[place] Controlled Database Checked Last filled: 11/05/17 # 100 LOV w/you: 06/10/18 Next appt w/you: 12/10/18

## 2018-10-30 MED FILL — traMADol HCL 50 MG TABS: 50 | 25 days supply | Qty: 100 | Fill #0

## 2018-10-31 ENCOUNTER — Other Ambulatory Visit: Payer: Self-pay

## 2018-10-31 ENCOUNTER — Ambulatory Visit: Payer: 59 | Admitting: Internal Medicine

## 2018-10-31 ENCOUNTER — Encounter: Payer: Self-pay | Admitting: Internal Medicine

## 2018-10-31 VITALS — BP 160/80 | HR 82 | Ht 64.0 in | Wt 199.0 lb

## 2018-10-31 DIAGNOSIS — E785 Hyperlipidemia, unspecified: Secondary | ICD-10-CM | POA: Diagnosis not present

## 2018-10-31 DIAGNOSIS — E1169 Type 2 diabetes mellitus with other specified complication: Secondary | ICD-10-CM

## 2018-10-31 DIAGNOSIS — E049 Nontoxic goiter, unspecified: Secondary | ICD-10-CM | POA: Diagnosis not present

## 2018-10-31 DIAGNOSIS — E113592 Type 2 diabetes mellitus with proliferative diabetic retinopathy without macular edema, left eye: Secondary | ICD-10-CM

## 2018-10-31 LAB — POCT GLYCOSYLATED HEMOGLOBIN (HGB A1C): Hemoglobin A1C: 6.1 % — AB (ref 4.0–5.6)

## 2018-10-31 MED ORDER — ATORVASTATIN CALCIUM 20 MG PO TABS: 20.0000 mg | ORAL_TABLET | Freq: Every day | ORAL | 3 refills | Status: DC

## 2018-10-31 MED FILL — ATORVASTATIN 20 MG TABLET: 20 | 90 days supply | Qty: 90 | Fill #0

## 2018-10-31 NOTE — Patient Instructions (Addendum)
Please continue: - Metformin 1000 mg 2x a day - Januvia 100 mg daily in am  Please start Lipitor 20 mg every 2-3 days after dinner. If you tolerate this well, move it every day.  Please come back for a follow-up appointment in 6 months.

## 2018-10-31 NOTE — Progress Notes (Signed)
Patient ID: Katelyn Peterson, female   DOB: [birth year], 73 y.o.   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 Last visit 6 months ago.  Reviewed HbA1c levels: Lab Results  Component Value Date   HGBA1C 6.2 (A) 05/02/2018   HGBA1C 6.0 (A) 10/29/2017   HGBA1C 6.3 04/23/2017   Pt is on a regimen of: - Metformin 1000 mg 2x a day - Januvia 100 mg daily in am We stopped Amaryl 2 mg bid and Actos 30 mg daily for daily hypoglycemia events and possible SEs, respectively.  Pt checks her sugars once a day in a.m.: - am: 80s-110 >> 82-110, 125 >> 87-120 >> 80-114 - 2h after b'fast: n/c >> 131 >> n/c >> 114 - before lunch: n/c >> 93 >> 100-110 >> n/c - 2h after lunch: 1n/c>> 110, 121 >> n/c - before dinner: 105, 115 >> n/c >> 102 >> n/c - 2h after dinner: 135, 135 >> n/c - bedtime:   90 >> 70 >> n/c Lowest sugar was 87 >> 80;  she has hypoglycemia awareness in the 80s. Highest sugar was 125 >> 120 >> 114.  -No CKD, last BUN/creatinine:  Lab Results  Component Value Date   BUN 22 06/10/2018   CREATININE 1.05 06/10/2018  On quinapril 40.  Latest ACR was normal: Lab Results  Component Value Date   MICRALBCREAT 0.9 06/10/2018   MICRALBCREAT 1.5 01/07/2017   MICRALBCREAT 0.9 05/08/2016   -+ HL; last set of lipids: Lab Results  Component Value Date   CHOL 232 (H) 06/10/2018   HDL 62.00 06/10/2018   LDLCALC 158 (H) 06/10/2018   LDLDIRECT 146.5 09/18/2012   TRIG 60.0 06/10/2018   CHOLHDL 4 06/10/2018  She had leg cramps with rosuvastatin, pravastatin, atorvastatin and fluvastatin XL.  At last visit I suggested Zetia but she did not start.  - last eye exam was in 06/202:  + DR; Dr. Edyth Gunnels.  -no numbness and tingling in her feet.  She also has a history of HTN, obesity. She has been considered for lap band surgery >> she did not want to follow  through with thiss. She also has bilateral chronic lower extremity cramps, right knee pain, anemia.  Latest TSH was normal: Lab Results  Component Value Date   TSH 1.62 06/10/2018   BP at work  - checked daily >> normal.  ROS: Constitutional: no weight gain/no weight loss, no fatigue, no subjective hyperthermia, no subjective hypothermia Eyes: no blurry vision, no xerophthalmia ENT: no sore throat, no nodules palpated in neck, no dysphagia, no odynophagia, no hoarseness Cardiovascular: no CP/no SOB/no palpitations/no leg swelling Respiratory: no cough/no SOB/no wheezing Gastrointestinal: no N/no V/no D/no C/no acid reflux Musculoskeletal: no muscle aches/no joint aches Skin: no rashes, no hair loss Neurological: no tremors/no numbness/no tingling/no dizziness  I reviewed pt's medications, allergies, PMH, social hx, family hx, and changes were documented in the history of present illness. Otherwise, unchanged from my initial visit note.  Past Medical History:  Diagnosis Date  . Arthritis   . HTN (hypertension)   . Hyperlipidemia   . Obesity   . Type II or unspecified type diabetes mellitus without mention of complication, not stated as uncontrolled    Past Surgical History:  Procedure Laterality Date  . ABDOMINAL HYSTERECTOMY     Social History   Socioeconomic History  . Marital status: Married  Spouse name: Not on file  . Number of children: Not on file  . Years of education: Not on file  . Highest education level: Not on file  Occupational History  . Occupation: Public house manager  Social Needs  . Financial resource strain: Not on file  . Food insecurity    Worry: Not on file    Inability: Not on file  . Transportation needs    Medical: Not on file    Non-medical: Not on file  Tobacco Use  . Smoking status: Never Smoker  . Smokeless tobacco: Never Used  Substance and Sexual Activity  . Alcohol use: No  . Drug use: No  . Sexual activity: Yes    Partners:  Male  Lifestyle  . Physical activity    Days per week: Not on file    Minutes per session: Not on file  . Stress: Not on file  Relationships  . Social Herbalist on phone: Not on file    Gets together: Not on file    Attends religious service: Not on file    Active member of club or organization: Not on file    Attends meetings of clubs or organizations: Not on file    Relationship status: Not on file  . Intimate partner violence    Fear of current or ex partner: Not on file    Emotionally abused: Not on file    Physically abused: Not on file    Forced sexual activity: Not on file  Other Topics Concern  . Not on file  Social History Narrative   Regular exercise: seldom   Caffeine use: occasionally         Current Outpatient Medications on File Prior to Visit  Medication Sig Dispense Refill  . cetirizine (ZYRTEC) 10 MG tablet TAKE ONE TABLET BY MOUTH ONCE DAILY 70 tablet 5  . Cholecalciferol 1000 UNITS tablet Take 1,000 Units by mouth daily.      . Diclofenac Sodium 2 % SOLN Apply 1 pump twice daily. 112 g 3  . ibuprofen (ADVIL,MOTRIN) 600 MG tablet TAKE 1 TABLET BY MOUTH TWICE DAILY AFTER MEALS FOR 1 WEEK, THEN AS NEEDED FOR PAIN 60 tablet 2  . JANUVIA 100 MG tablet TAKE 1 TABLET BY MOUTH ONCE DAILY IN THE MORNING 90 tablet 3  . loratadine (CLARITIN) 10 MG tablet Take 1 tablet (10 mg total) by mouth daily. 90 tablet 0  . Menthol, Topical Analgesic, (BIOFREEZE) 4 % GEL Apply to affected area daily as needed 150 mL 0  . metFORMIN (GLUCOPHAGE) 1000 MG tablet TAKE 1 TABLET BY MOUTH TWICE DAILY WITH A MEAL 180 tablet 3  . OVER THE COUNTER MEDICATION Goody Powder for headaches. PRN    . quinapril (ACCUPRIL) 40 MG tablet TAKE 1 TABLET BY MOUTH ONCE DAILY 90 tablet 3  . spironolactone (ALDACTONE) 50 MG tablet TAKE 1 TABLET (50 MG TOTAL) BY MOUTH DAILY 90 tablet 3  . traMADol (ULTRAM) 50 MG tablet TAKE 1 TO 2 TABLETS BY MOUTH 2 TIMES DAILY AS NEEDED FOR SEVERE PAIN 100 tablet  1  . verapamil (VERELAN PM) 240 MG 24 hr capsule TAKE 1 CAPSULE BY MOUTH AT BEDTIME 90 capsule 1   Current Facility-Administered Medications on File Prior to Visit  Medication Dose Route Frequency Provider Last Rate Last Dose  . 0.9 %  sodium chloride infusion  500 mL Intravenous Continuous Milus Banister, MD       Allergies  Allergen Reactions  .  Atorvastatin     REACTION: cramps  . Hydrochlorothiazide     REACTION: Cramps  . Pravastatin     cramps   Family History  Problem Relation Age of Onset  . Hypertension Mother   . Diabetes Mother   . Hypertension Father   . Hypertension Other   . Colon cancer Neg Hx   . Esophageal cancer Neg Hx   . Rectal cancer Neg Hx   . Stomach cancer Neg Hx    PE: BP (!) 160/80   Pulse 82   Ht 5\' 4"  (1.626 m)   Wt 199 lb (90.3 kg)   SpO2 97%   BMI 34.16 kg/m  Body mass index is 34.16 kg/m.  Wt Readings from Last 3 Encounters:  10/31/18 199 lb (90.3 kg)  06/10/18 197 lb 1.9 oz (89.4 kg)  05/02/18 196 lb (88.9 kg)   Constitutional: overweight, in NAD Eyes: PERRLA, EOMI, no exophthalmos ENT: moist mucous membranes, no thyromegaly, no cervical lymphadenopathy Cardiovascular: RRR, No MRG Respiratory: CTA B Gastrointestinal: abdomen soft, NT, ND, BS+ Musculoskeletal: no deformities, strength intact in all 4 Skin: moist, warm, no rashes Neurological: no tremor with outstretched hands, DTR normal in all 4  ASSESSMENT: 1. 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 Hyperlipidemia - Developed leg cramps with pravastatin, atorvastatin, Crestor, fluvastatin  3. Goiter by palpation 11/04/2012: Thyroid U/S:  Right thyroid lobe: 4.3 x 1.0 x 1.8 cm   Left thyroid lobe: 4.6 x 1.2 x 1.9 cm   Isthmus: 5 mm   Focal nodules: Thyroid echotexture is homogeneous, without solid or cystic lesion.   Lymphadenopathy: None visualized.  IMPRESSION:  Normal thyroid ultrasound. No evidence of thyromegaly or dominant  solid/cystic mass.  PLAN:  1. DM2 -Patient with longstanding, well-controlled, type 2 diabetes, on oral antidiabetic regimen, with good control.  No hypoglycemic or hyperglycemic spikes.  She only check sugars in the morning and his sugars are at goal.  I advised her at previous visit about checking sugars later in the day but she is not rotating check times. -She has no side effects from metformin and Januvia.  We will continue this for now. -Reviewed latest HbA1c which was slightly higher but still at goal, at 6.2% - I suggested to:  Patient Instructions  Please continue: - Metformin 1000 mg 2x a day - Januvia 100 mg daily in am  Please start Lipitor 20 mg every 2-3 days after dinner. If you tolerate this well, move it every day.  Please come back for a follow-up appointment in 6 months.  - we checked her HbA1c: 6.1% (better) - advised to check sugars at different times of the day - 1x a day, rotating check times - advised for yearly eye exams >> she is UTD - return to clinic in 6 months  2. Hyperlipidemia - Reviewed latest lipid panel from 05/2018: LDL high.  I suggested Zetia but she did not start yet. Lab Results  Component Value Date   CHOL 232 (H) 06/10/2018   HDL 62.00 06/10/2018   LDLCALC 158 (H) 06/10/2018   LDLDIRECT 146.5 09/18/2012   TRIG 60.0 06/10/2018   CHOLHDL 4 06/10/2018  -She had leg cramps from Crestor 5, pravastatin, atorvastatin, fluvastatin XL in the past.  At this visit she tells me that she would like to maybe retry Lipitor again.  We will start 20 mg every 2 to 3 days and I advised her to move it closer together if she tolerates it well.  We  also discussed about the potential use of co-Q10 and magnesium to help with muscle aches. -She may be a candidate for PCSK9 inhibitor -may need referral to lipid clinic if she cannot tolerate Lipitor or if LDL not significantly lowered afterwards.  3. Non-nodular Goiter -no neck compression sxs -Reviewed her thyroid  ultrasound report from 2014: No nodules -We will continue to follow her clinically  Philemon Kingdom, MD PhD [facility] Endocrinology

## 2018-10-31 NOTE — Addendum Note (Signed)
Addended by: Cardell Peach I on: 10/31/2018 09:23 AM   Modules accepted: Orders

## 2018-12-03 MED FILL — JANUVIA 100 MG TABLET: 100 | 90 days supply | Qty: 90 | Fill #2

## 2018-12-10 ENCOUNTER — Encounter: Payer: Self-pay | Admitting: Internal Medicine

## 2018-12-10 ENCOUNTER — Ambulatory Visit (INDEPENDENT_AMBULATORY_CARE_PROVIDER_SITE_OTHER): Payer: 59 | Admitting: Internal Medicine

## 2018-12-10 ENCOUNTER — Other Ambulatory Visit: Payer: Self-pay

## 2018-12-10 VITALS — BP 144/82 | HR 77 | Temp 98.2°F | Ht 64.0 in | Wt 205.0 lb

## 2018-12-10 DIAGNOSIS — E785 Hyperlipidemia, unspecified: Secondary | ICD-10-CM

## 2018-12-10 DIAGNOSIS — I10 Essential (primary) hypertension: Secondary | ICD-10-CM

## 2018-12-10 DIAGNOSIS — E113592 Type 2 diabetes mellitus with proliferative diabetic retinopathy without macular edema, left eye: Secondary | ICD-10-CM

## 2018-12-10 DIAGNOSIS — Z6835 Body mass index (BMI) 35.0-35.9, adult: Secondary | ICD-10-CM

## 2018-12-10 DIAGNOSIS — E1169 Type 2 diabetes mellitus with other specified complication: Secondary | ICD-10-CM | POA: Diagnosis not present

## 2018-12-10 DIAGNOSIS — M25532 Pain in left wrist: Secondary | ICD-10-CM | POA: Diagnosis not present

## 2018-12-10 DIAGNOSIS — Z0000 Encounter for general adult medical examination without abnormal findings: Secondary | ICD-10-CM

## 2018-12-10 NOTE — Progress Notes (Signed)
Subjective:  Patient ID: Katelyn Peterson, female    DOB: 1946-01-10  Age: 73 y.o. MRN: [id number]  CC: No chief complaint on file.   HPI Katelyn Peterson presents for DM, HTN, dyslipidemia C/o L wrist pain x 1 week (L handed) - used ice, heat  Outpatient Medications Prior to Visit  Medication Sig Dispense Refill  . atorvastatin (LIPITOR) 20 MG tablet Take 1 tablet (20 mg total) by mouth daily. 90 tablet 3  . cetirizine (ZYRTEC) 10 MG tablet TAKE ONE TABLET BY MOUTH ONCE DAILY 70 tablet 5  . Cholecalciferol 1000 UNITS tablet Take 1,000 Units by mouth daily.      . Diclofenac Sodium 2 % SOLN Apply 1 pump twice daily. 112 g 3  . ibuprofen (ADVIL,MOTRIN) 600 MG tablet TAKE 1 TABLET BY MOUTH TWICE DAILY AFTER MEALS FOR 1 WEEK, THEN AS NEEDED FOR PAIN 60 tablet 2  . JANUVIA 100 MG tablet TAKE 1 TABLET BY MOUTH ONCE DAILY IN THE MORNING 90 tablet 3  . loratadine (CLARITIN) 10 MG tablet Take 1 tablet (10 mg total) by mouth daily. 90 tablet 0  . Menthol, Topical Analgesic, (BIOFREEZE) 4 % GEL Apply to affected area daily as needed 150 mL 0  . metFORMIN (GLUCOPHAGE) 1000 MG tablet TAKE 1 TABLET BY MOUTH TWICE DAILY WITH A MEAL 180 tablet 3  . OVER THE COUNTER MEDICATION Goody Powder for headaches. PRN    . quinapril (ACCUPRIL) 40 MG tablet TAKE 1 TABLET BY MOUTH ONCE DAILY 90 tablet 3  . spironolactone (ALDACTONE) 50 MG tablet TAKE 1 TABLET (50 MG TOTAL) BY MOUTH DAILY 90 tablet 3  . traMADol (ULTRAM) 50 MG tablet TAKE 1 TO 2 TABLETS BY MOUTH 2 TIMES DAILY AS NEEDED FOR SEVERE PAIN 100 tablet 1  . verapamil (VERELAN PM) 240 MG 24 hr capsule TAKE 1 CAPSULE BY MOUTH AT BEDTIME 90 capsule 1   Facility-Administered Medications Prior to Visit  Medication Dose Route Frequency Provider Last Rate Last Dose  . 0.9 %  sodium chloride infusion  500 mL Intravenous Continuous Milus Banister, MD        ROS: Review of Systems  Constitutional: Negative for activity change, appetite change, chills,  fatigue and unexpected weight change.  HENT: Negative for congestion, mouth sores and sinus pressure.   Eyes: Negative for visual disturbance.  Respiratory: Negative for cough and chest tightness.   Gastrointestinal: Negative for abdominal pain and nausea.  Genitourinary: Negative for difficulty urinating, frequency and vaginal pain.  Musculoskeletal: Positive for arthralgias. Negative for back pain and gait problem.  Skin: Negative for pallor and rash.  Neurological: Negative for dizziness, tremors, weakness, numbness and headaches.  Psychiatric/Behavioral: Negative for confusion and sleep disturbance.    Objective:  BP (!) 144/82 (BP Location: Left Arm, Patient Position: Sitting, Cuff Size: Large)   Pulse 77   Temp 98.2 F (36.8 C) (Oral)   Ht 5\' 4"  (1.626 m)   Wt 205 lb (93 kg) Comment: with shoes  SpO2 99%   BMI 35.19 kg/m   BP Readings from Last 3 Encounters:  12/10/18 (!) 144/82  10/31/18 (!) 160/80  06/10/18 128/72    Wt Readings from Last 3 Encounters:  12/10/18 205 lb (93 kg)  10/31/18 199 lb (90.3 kg)  06/10/18 197 lb 1.9 oz (89.4 kg)    Physical Exam Constitutional:      General: She is not in acute distress.    Appearance: She is well-developed.  HENT:  Head: Normocephalic.     Right Ear: External ear normal.     Left Ear: External ear normal.     Nose: Nose normal.  Eyes:     General:        Right eye: No discharge.        Left eye: No discharge.     Conjunctiva/sclera: Conjunctivae normal.     Pupils: Pupils are equal, round, and reactive to light.  Neck:     Musculoskeletal: Normal range of motion and neck supple.     Thyroid: No thyromegaly.     Vascular: No JVD.     Trachea: No tracheal deviation.  Cardiovascular:     Rate and Rhythm: Normal rate and regular rhythm.     Heart sounds: Normal heart sounds.  Pulmonary:     Effort: No respiratory distress.     Breath sounds: No stridor. No wheezing.  Abdominal:     General: Bowel sounds  are normal. There is no distension.     Palpations: Abdomen is soft. There is no mass.     Tenderness: There is no abdominal tenderness. There is no guarding or rebound.  Musculoskeletal:        General: No tenderness.  Lymphadenopathy:     Cervical: No cervical adenopathy.  Skin:    Findings: No erythema or rash.  Neurological:     Mental Status: She is oriented to person, place, and time.     Cranial Nerves: No cranial nerve deficit.     Motor: No abnormal muscle tone.     Coordination: Coordination normal.     Deep Tendon Reflexes: Reflexes normal.  Psychiatric:        Behavior: Behavior normal.        Thought Content: Thought content normal.        Judgment: Judgment normal.     Lab Results  Component Value Date   WBC 5.8 06/10/2018   HGB 10.8 (L) 06/10/2018   HCT 32.8 (L) 06/10/2018   PLT 259.0 06/10/2018   GLUCOSE 82 06/10/2018   CHOL 232 (H) 06/10/2018   TRIG 60.0 06/10/2018   HDL 62.00 06/10/2018   LDLDIRECT 146.5 09/18/2012   LDLCALC 158 (H) 06/10/2018   ALT 9 06/10/2018   AST 9 06/10/2018   NA 139 06/10/2018   K 4.8 06/10/2018   CL 106 06/10/2018   CREATININE 1.05 06/10/2018   BUN 22 06/10/2018   CO2 25 06/10/2018   TSH 1.62 06/10/2018   HGBA1C 6.1 (A) 10/31/2018   MICROALBUR 1.1 06/10/2018    Dg Knee 1-2 Views Right  Result Date: 06/20/2017 CLINICAL DATA:  Right knee pain and swelling for several days, no known injury, initial encounter EXAM: RIGHT KNEE - 2 VIEW COMPARISON:  None. FINDINGS: Tricompartmental degenerative changes are noted worst in the medial joint space. Multiple well calcified densities are identified in the suprapatellar bursa consistent with loose bodies. Some additional loose bodies are also noted in the posterior aspect of the joint. 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 No acute abnormality is noted. Electronically Signed   By: Inez Catalina M.D.   On: 06/20/2017 08:33    Assessment & Plan:    There are no diagnoses linked to this encounter.   No orders of the defined types were placed in this encounter.    Follow-up: No follow-ups on file.  Walker Kehr, MD

## 2018-12-10 NOTE — Assessment & Plan Note (Signed)
Spironolactone, quinapril, Verapamil BP ok at home

## 2018-12-10 NOTE — Assessment & Plan Note (Signed)
-   Crestor 

## 2018-12-10 NOTE — Assessment & Plan Note (Addendum)
MSK pain ACE Voltaren gel Ergonomic wrist support

## 2018-12-10 NOTE — Patient Instructions (Addendum)
Voltaren gel ACE Ergonomic wrist support    These suggestions will probably help you to improve your metabolism if you are not overweight and to lose weight if you are overweight: 1.  Reduce your consumption of sugars and starches.  Eliminate high fructose corn syrup from your diet.  Reduce your consumption of processed foods.  For desserts try to have seasonal fruits, berries, nuts, cheeses or dark chocolate with more than 70% cacao. 2.  Do not snack 3.  You do not have to eat breakfast.  If you choose to have breakfast-eat plain greek yogurt, eggs, oatmeal (without sugar) 4.  Drink water, freshly brewed unsweetened tea (green, black or herbal) or coffee.  Do not drink sodas including diet sodas , juices, beverages sweetened with artificial sweeteners. 5.  Reduce your consumption of refined grains. 6.  Avoid protein drinks such as Optifast, Slim fast etc. Eat chicken, fish, meat, dairy and beans for your sources of protein 7.  Natural unprocessed fats like cold pressed virgin olive oil, butter, coconut oil are good for you.  Eat avocados 8.  Increase your consumption of fiber.  Fruits, berries, vegetables, whole grains, flaxseeds, Chia seeds, beans, popcorn, nuts, oatmeal are good sources of fiber 9.  Use vinegar in your diet, i.e. apple cider vinegar, red wine or balsamic vinegar 10.  You can try fasting.  For example you can skip breakfast and lunch every other day (24-hour fast) 11.  Stress reduction, good night sleep, relaxation, meditation, yoga and other physical activity is likely to help you to maintain low weight too. 12.  If you drink alcohol, limit your alcohol intake to no more than 2 drinks a day.   Mediterranean diet is good for you. ([employer] has a typical Mediterranean cuisine menu) The Mediterranean diet is a way of eating based on the traditional cuisine of countries bordering the [employer]. While there is no single definition of the Mediterranean diet, it is  typically high in vegetables, fruits, whole grains, beans, nut and seeds, and olive oil. The main components of Mediterranean diet include: [name] Daily consumption of vegetables, fruits, whole grains and healthy fats  . Weekly intake of fish, poultry, beans and eggs  . Moderate portions of dairy products  . Limited intake of red meat Other important elements of the Mediterranean diet are sharing meals with family and friends, enjoying a glass of red wine and being physically active. Health benefits of a Mediterranean diet: A traditional Mediterranean diet consisting of large quantities of fresh fruits and vegetables, nuts, fish and olive oil-coupled with physical activity-can reduce your risk of serious mental and physical health problems by: Preventing heart disease and strokes. Following a Mediterranean diet limits your intake of refined breads, processed foods, and red meat, and encourages drinking red wine instead of hard liquor-all factors that can help prevent heart disease and stroke. Keeping you agile. If you're an older adult, the nutrients gained with a Mediterranean diet may reduce your risk of developing muscle weakness and other signs of frailty by about 70 percent. Reducing the risk of Alzheimer's. Research suggests that the [place] diet may improve cholesterol, blood sugar levels, and overall blood vessel health, which in turn may reduce your risk of Alzheimer's disease or dementia. Halving the risk of Parkinson's disease. The high levels of antioxidants in the Mediterranean diet can prevent cells from undergoing a damaging process called oxidative stress, thereby cutting the risk of Parkinson's disease in half. Increasing longevity. By reducing your risk of  developing heart disease or cancer with the Mediterranean diet, you're reducing your risk of death at any age by 20%. Protecting against type 2 diabetes. A Mediterranean diet is rich in fiber which digests slowly, prevents huge  swings in blood sugar, and can help you maintain a healthy weight.    Cabbage soup recipe that will not make you gain weight: Take 1 small head of cabbage, 1 average pack of celery, 4 green peppers, 4 onions, 2 cans diced tomatoes (they are not available without salt), salt and spices to taste.  Chop cabbage, celery, peppers and onions.  And tomatoes and 2-2.5 liters (2.5 quarts) of water so that it would just cover the vegetables.  Bring to boil.  Add spices and salt.  Turn heat to low/medium and simmer for 20-25 minutes.  Naturally, you can make a smaller batch and change some of the ingredients.

## 2018-12-10 NOTE — Assessment & Plan Note (Signed)
Wt Readings from Last 3 Encounters:  12/10/18 205 lb (93 kg)  10/31/18 199 lb (90.3 kg)  06/10/18 197 lb 1.9 oz (89.4 kg)  Diet discussed

## 2018-12-10 NOTE — Assessment & Plan Note (Signed)
Metformin, Januvia, Crestor

## 2018-12-25 DIAGNOSIS — M1711 Unilateral primary osteoarthritis, right knee: Secondary | ICD-10-CM | POA: Diagnosis not present

## 2019-01-01 DIAGNOSIS — M25561 Pain in right knee: Secondary | ICD-10-CM | POA: Diagnosis not present

## 2019-01-01 DIAGNOSIS — M1711 Unilateral primary osteoarthritis, right knee: Secondary | ICD-10-CM | POA: Diagnosis not present

## 2019-01-08 DIAGNOSIS — M1711 Unilateral primary osteoarthritis, right knee: Secondary | ICD-10-CM | POA: Diagnosis not present

## 2019-02-26 ENCOUNTER — Other Ambulatory Visit: Payer: Self-pay | Admitting: Internal Medicine

## 2019-02-26 MED FILL — QUINAPRIL 40 MG TABLET: 40 | 90 days supply | Qty: 90 | Fill #0

## 2019-02-26 MED FILL — VERAPAMIL ER 240 MG CAPSULE: 240 | 90 days supply | Qty: 90 | Fill #0

## 2019-02-26 MED FILL — SPIRONOLACTONE 50 MG TABS: 50 | 90 days supply | Qty: 90 | Fill #3

## 2019-02-26 MED FILL — JANUVIA 100 MG TABLET: 100 | 90 days supply | Qty: 90 | Fill #3

## 2019-05-05 ENCOUNTER — Other Ambulatory Visit: Payer: Self-pay

## 2019-05-05 ENCOUNTER — Ambulatory Visit: Payer: 59 | Admitting: Internal Medicine

## 2019-05-05 ENCOUNTER — Encounter: Payer: Self-pay | Admitting: Internal Medicine

## 2019-05-05 VITALS — BP 128/88 | HR 75 | Ht 64.0 in | Wt 200.0 lb

## 2019-05-05 DIAGNOSIS — E785 Hyperlipidemia, unspecified: Secondary | ICD-10-CM

## 2019-05-05 DIAGNOSIS — E113592 Type 2 diabetes mellitus with proliferative diabetic retinopathy without macular edema, left eye: Secondary | ICD-10-CM | POA: Diagnosis not present

## 2019-05-05 DIAGNOSIS — E1169 Type 2 diabetes mellitus with other specified complication: Secondary | ICD-10-CM

## 2019-05-05 DIAGNOSIS — Z6835 Body mass index (BMI) 35.0-35.9, adult: Secondary | ICD-10-CM

## 2019-05-05 LAB — POCT GLYCOSYLATED HEMOGLOBIN (HGB A1C): Hemoglobin A1C: 6.2 % — AB (ref 4.0–5.6)

## 2019-05-05 NOTE — Progress Notes (Signed)
Patient ID: Katelyn Peterson, female   DOB: [birth year], 74 y.o.   MRN: [id number]  This visit occurred during the SARS-CoV-2 public health emergency.  Safety protocols were in place, including screening questions prior to the visit, additional usage of staff PPE, and extensive cleaning of exam room while observing appropriate contact time as indicated for disinfecting solutions.   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 Last visit 6 months ago.  Reviewed her HbA1c levels: Lab Results  Component Value Date   HGBA1C 6.1 (A) 10/31/2018   HGBA1C 6.2 (A) 05/02/2018   HGBA1C 6.0 (A) 10/29/2017   Pt is on a regimen of: - Metformin 1000 mg 2x a day - Januvia 100 mg daily in am We stopped Amaryl 2 mg bid and Actos 30 mg daily for daily hypoglycemia events and possible SEs, respectively.  Pt checks her sugars once a day in a.m.: - am: 82-110, 125 >> 87-120 >> 80-114 >> 88, 92-110 - 2h after b'fast: n/c >> 131 >> n/c >> 114 - before lunch: n/c >> 93 >> 100-110 >> n/c - 2h after lunch: 1n/c>> 110, 121 >> n/c - before dinner: 105, 115 >> n/c >> 102 >> n/c - 2h after dinner: 135, 135 >> n/c - bedtime:   90 >> 70 >> n/c Lowest sugar was 87 >> 80 >> 88;  she has hypoglycemia awareness in the 80s. Highest sugar was 125 >> 120 >> 114 >> 110.  -No CKD, last BUN/creatinine:  Lab Results  Component Value Date   BUN 22 06/10/2018   CREATININE 1.05 06/10/2018  On quinapril 40.  ACR normal: Lab Results  Component Value Date   MICRALBCREAT 0.9 06/10/2018   MICRALBCREAT 1.5 01/07/2017   MICRALBCREAT 0.9 05/08/2016   -+ HL; last set of lipids: Lab Results  Component Value Date   CHOL 232 (H) 06/10/2018   HDL 62.00 06/10/2018   LDLCALC 158 (H) 06/10/2018   LDLDIRECT 146.5 09/18/2012   TRIG 60.0 06/10/2018   CHOLHDL 4 06/10/2018  She had leg cramps with rosuvastatin,  pravastatin, atorvastatin and fluvastatin XL.  I advised her in the past about starting Zetia, but she did not start.  At last visit I suggested Lipitor low-dose every other day. She tolerates this reasonably - ues a mm rub cream for cramps  - last eye exam was in 08/2018: + DR; Dr. Edyth Gunnels.  -She denies numbness and tingling in her feet.  She also has a history of HTN. She has been considered for lap band surgery >> she did not want to follow through with thiss. She also has bilateral chronic lower extremity cramps, right knee pain, anemia.  Latest TSH was normal Lab Results  Component Value Date   TSH 1.62 06/10/2018   ROS: Constitutional: no weight gain/+ weight loss, no fatigue, no subjective hyperthermia, no subjective hypothermia Eyes: no blurry vision, no xerophthalmia ENT: no sore throat, no nodules palpated in neck, no dysphagia, no odynophagia, no hoarseness Cardiovascular: no CP/no SOB/no palpitations/no leg swelling Respiratory: no cough/no SOB/no wheezing Gastrointestinal: no N/no V/no D/no C/no acid reflux Musculoskeletal: no muscle aches/no joint aches Skin: no rashes, no hair loss Neurological: no tremors/no numbness/no tingling/no dizziness  I reviewed pt's medications, allergies, PMH, social hx, family hx, and changes were documented in the history of present illness. Otherwise, unchanged from my initial visit note.  Past Medical History:  Diagnosis Date  .  Arthritis   . HTN (hypertension)   . Hyperlipidemia   . Obesity   . Type II or unspecified type diabetes mellitus without mention of complication, not stated as uncontrolled    Past Surgical History:  Procedure Laterality Date  . ABDOMINAL HYSTERECTOMY     Social History   Socioeconomic History  . Marital status: Married    Spouse name: Not on file  . Number of children: Not on file  . Years of education: Not on file  . Highest education level: Not on file  Occupational History  . Occupation:  Public house manager  Tobacco Use  . Smoking status: Never Smoker  . Smokeless tobacco: Never Used  Substance and Sexual Activity  . Alcohol use: No  . Drug use: No  . Sexual activity: Yes    Partners: Male  Other Topics Concern  . Not on file  Social History Narrative   Regular exercise: seldom   Caffeine use: occasionally         Social Determinants of Health   Financial Resource Strain:   . Difficulty of Paying Living Expenses: Not on file  Food Insecurity:   . Worried About Charity fundraiser in the Last Year: Not on file  . Ran Out of Food in the Last Year: Not on file  Transportation Needs:   . Lack of Transportation (Medical): Not on file  . Lack of Transportation (Non-Medical): Not on file  Physical Activity:   . Days of Exercise per Week: Not on file  . Minutes of Exercise per Session: Not on file  Stress:   . Feeling of Stress : Not on file  Social Connections:   . Frequency of Communication with Friends and Family: Not on file  . Frequency of Social Gatherings with Friends and Family: Not on file  . Attends Religious Services: Not on file  . Active Member of Clubs or Organizations: Not on file  . Attends Archivist Meetings: Not on file  . Marital Status: Not on file  Intimate Partner Violence:   . Fear of Current or Ex-Partner: Not on file  . Emotionally Abused: Not on file  . Physically Abused: Not on file  . Sexually Abused: Not on file   Current Outpatient Medications on File Prior to Visit  Medication Sig Dispense Refill  . atorvastatin (LIPITOR) 20 MG tablet Take 1 tablet (20 mg total) by mouth daily. 90 tablet 3  . cetirizine (ZYRTEC) 10 MG tablet TAKE ONE TABLET BY MOUTH ONCE DAILY 70 tablet 5  . Cholecalciferol 1000 UNITS tablet Take 1,000 Units by mouth daily.      . Diclofenac Sodium 2 % SOLN Apply 1 pump twice daily. 112 g 3  . ibuprofen (ADVIL,MOTRIN) 600 MG tablet TAKE 1 TABLET BY MOUTH TWICE DAILY AFTER MEALS FOR 1 WEEK, THEN AS  NEEDED FOR PAIN 60 tablet 2  . JANUVIA 100 MG tablet TAKE 1 TABLET BY MOUTH ONCE DAILY IN THE MORNING 90 tablet 3  . loratadine (CLARITIN) 10 MG tablet Take 1 tablet (10 mg total) by mouth daily. 90 tablet 0  . Menthol, Topical Analgesic, (BIOFREEZE) 4 % GEL Apply to affected area daily as needed 150 mL 0  . metFORMIN (GLUCOPHAGE) 1000 MG tablet TAKE 1 TABLET BY MOUTH TWICE DAILY WITH A MEAL 180 tablet 3  . OVER THE COUNTER MEDICATION Goody Powder for headaches. PRN    . quinapril (ACCUPRIL) 40 MG tablet TAKE 1 TABLET BY MOUTH ONCE DAILY 90 tablet  3  . spironolactone (ALDACTONE) 50 MG tablet TAKE 1 TABLET (50 MG TOTAL) BY MOUTH DAILY 90 tablet 3  . traMADol (ULTRAM) 50 MG tablet TAKE 1 TO 2 TABLETS BY MOUTH 2 TIMES DAILY AS NEEDED FOR SEVERE PAIN 100 tablet 1  . verapamil (VERELAN PM) 240 MG 24 hr capsule TAKE 1 CAPSULE BY MOUTH AT BEDTIME 90 capsule 1   Current Facility-Administered Medications on File Prior to Visit  Medication Dose Route Frequency Provider Last Rate Last Admin  . 0.9 %  sodium chloride infusion  500 mL Intravenous Continuous Milus Banister, MD       Allergies  Allergen Reactions  . Atorvastatin     REACTION: cramps  . Hydrochlorothiazide     REACTION: Cramps  . Pravastatin     cramps   Family History  Problem Relation Age of Onset  . Hypertension Mother   . Diabetes Mother   . Hypertension Father   . Hypertension Other   . Colon cancer Neg Hx   . Esophageal cancer Neg Hx   . Rectal cancer Neg Hx   . Stomach cancer Neg Hx    PE: BP 128/88   Pulse 75   Ht 5\' 4"  (1.626 m)   Wt 200 lb (90.7 kg)   SpO2 98%   BMI 34.33 kg/m  Body mass index is 34.33 kg/m.  Wt Readings from Last 3 Encounters:  05/05/19 200 lb (90.7 kg)  12/10/18 205 lb (93 kg)  10/31/18 199 lb (90.3 kg)   Constitutional: overweight, in NAD Eyes: PERRLA, EOMI, no exophthalmos ENT: moist mucous membranes, no thyromegaly, no cervical lymphadenopathy Cardiovascular: RRR, No  MRG Respiratory: CTA B Gastrointestinal: abdomen soft, NT, ND, BS+ Musculoskeletal: no deformities, strength intact in all 4 Skin: moist, warm, no rashes Neurological: no tremor with outstretched hands, DTR normal in all 4  ASSESSMENT: 1. 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 Hyperlipidemia - Developed leg cramps with pravastatin, atorvastatin, Crestor, fluvastatin  3.  Obesity class II  4. Goiter by palpation 11/04/2012: Thyroid U/S:  Right thyroid lobe: 4.3 x 1.0 x 1.8 cm   Left thyroid lobe: 4.6 x 1.2 x 1.9 cm   Isthmus: 5 mm   Focal nodules: Thyroid echotexture is homogeneous, without solid or cystic lesion.   Lymphadenopathy: None visualized.  IMPRESSION:  Normal thyroid ultrasound. No evidence of thyromegaly or dominant solid/cystic mass.  -No further intervention needed for this  PLAN:  1. DM2 -Patient with longstanding, well-controlled, type 2 diabetes, on oral antidiabetic regimen with good control.  No hyper or hypoglycemic episodes.  She continues to only check sugars in the morning and they are at goal.  I advised her at previous visits about checking sugars later in the day, also, but she is still not rotating check times. -She has no side effects from Metformin and Januvia.  We will continue the same regimen for now. -At last visit HbA1c was excellent, at 6.1% - I suggested to:  Patient Instructions  Please continue: - Metformin 1000 mg 2x a day - Januvia 100 mg daily in am  Please come back for a follow-up appointment in 6 months.  - we checked her HbA1c: 6.2% (slightly higher) - advised to check sugars at different times of the day - 1x a day, rotating check times - advised for yearly eye exams >> she is UTD - return to clinic in 6 months  2. Hyperlipidemia -Reviewed latest lipid panel from almost a year ago: LDL  above target. Lab Results  Component Value Date   CHOL 232 (H) 06/10/2018   HDL 62.00 06/10/2018    LDLCALC 158 (H) 06/10/2018   LDLDIRECT 146.5 09/18/2012   TRIG 60.0 06/10/2018   CHOLHDL 4 06/10/2018  -She had leg cramps from Crestor 5, pravastatin, atorvastatin, fluvastatin XL in the past.  At this visit she tells me that she would like to maybe retry Lipitor again.  We will start 20 mg every 2 to 3 days and I advised her to move it closer together if she tolerates it well.  Discussed in the past about the potential use of co-Q10 and magnesium to help with muscle aches. -At last visit we decided to retry Lipitor every 2 to 3 days after dinner.  I advised her that if she tolerated this well, to move it every day. She now takes it mostly every other day but occasionally  takes it in consecutive days.  3.  Obesity class II -Continue Metformin and Januvia which are weight neutral -losr 5 lbs weight since last visit  Philemon Kingdom, MD PhD [facility]

## 2019-05-05 NOTE — Patient Instructions (Signed)
Please continue: - Metformin 1000 mg 2x a day - Januvia 100 mg daily in am  Please come back for a follow-up appointment in 6 months.

## 2019-05-27 ENCOUNTER — Other Ambulatory Visit: Payer: Self-pay | Admitting: Internal Medicine

## 2019-05-27 MED FILL — ATORVASTATIN 20 MG TABLET: 20 | 90 days supply | Qty: 90 | Fill #1

## 2019-05-29 ENCOUNTER — Telehealth: Payer: Self-pay

## 2019-05-29 NOTE — Telephone Encounter (Signed)
RX request sent to DR Plotnikov on 03/17

## 2019-06-01 MED FILL — traMADol HCL 50 MG TABS: 50 | 25 days supply | Qty: 100 | Fill #0

## 2019-06-09 ENCOUNTER — Ambulatory Visit: Payer: 59 | Admitting: Internal Medicine

## 2019-06-09 ENCOUNTER — Other Ambulatory Visit: Payer: Self-pay

## 2019-06-09 ENCOUNTER — Other Ambulatory Visit (INDEPENDENT_AMBULATORY_CARE_PROVIDER_SITE_OTHER): Payer: 59

## 2019-06-09 ENCOUNTER — Other Ambulatory Visit: Payer: Self-pay | Admitting: Internal Medicine

## 2019-06-09 ENCOUNTER — Encounter: Payer: Self-pay | Admitting: Internal Medicine

## 2019-06-09 DIAGNOSIS — E1169 Type 2 diabetes mellitus with other specified complication: Secondary | ICD-10-CM

## 2019-06-09 DIAGNOSIS — Z0000 Encounter for general adult medical examination without abnormal findings: Secondary | ICD-10-CM

## 2019-06-09 DIAGNOSIS — Z6835 Body mass index (BMI) 35.0-35.9, adult: Secondary | ICD-10-CM | POA: Diagnosis not present

## 2019-06-09 DIAGNOSIS — E113592 Type 2 diabetes mellitus with proliferative diabetic retinopathy without macular edema, left eye: Secondary | ICD-10-CM

## 2019-06-09 DIAGNOSIS — G8929 Other chronic pain: Secondary | ICD-10-CM | POA: Diagnosis not present

## 2019-06-09 DIAGNOSIS — E785 Hyperlipidemia, unspecified: Secondary | ICD-10-CM

## 2019-06-09 DIAGNOSIS — M25561 Pain in right knee: Secondary | ICD-10-CM | POA: Diagnosis not present

## 2019-06-09 LAB — MICROALBUMIN / CREATININE URINE RATIO
Creatinine,U: 43.8 mg/dL
Microalb Creat Ratio: 2.3 mg/g (ref 0.0–30.0)
Microalb, Ur: 1 mg/dL (ref 0.0–1.9)

## 2019-06-09 LAB — CBC WITH DIFFERENTIAL/PLATELET
Basophils Absolute: 0 10*3/uL (ref 0.0–0.1)
Basophils Relative: 0.6 % (ref 0.0–3.0)
Eosinophils Absolute: 0.1 10*3/uL (ref 0.0–0.7)
Eosinophils Relative: 1.1 % (ref 0.0–5.0)
HCT: 30.8 % — ABNORMAL LOW (ref 36.0–46.0)
Hemoglobin: 10.1 g/dL — ABNORMAL LOW (ref 12.0–15.0)
Lymphocytes Relative: 30.4 % (ref 12.0–46.0)
Lymphs Abs: 1.8 10*3/uL (ref 0.7–4.0)
MCHC: 32.8 g/dL (ref 30.0–36.0)
MCV: 86.8 fl (ref 78.0–100.0)
Monocytes Absolute: 0.4 10*3/uL (ref 0.1–1.0)
Monocytes Relative: 6.2 % (ref 3.0–12.0)
Neutro Abs: 3.6 10*3/uL (ref 1.4–7.7)
Neutrophils Relative %: 61.7 % (ref 43.0–77.0)
Platelets: 233 10*3/uL (ref 150.0–400.0)
RBC: 3.55 Mil/uL — ABNORMAL LOW (ref 3.87–5.11)
RDW: 15.3 % (ref 11.5–15.5)
WBC: 5.8 10*3/uL (ref 4.0–10.5)

## 2019-06-09 LAB — URINALYSIS
Bilirubin Urine: NEGATIVE
Hgb urine dipstick: NEGATIVE
Ketones, ur: NEGATIVE
Leukocytes,Ua: NEGATIVE
Nitrite: NEGATIVE
Specific Gravity, Urine: 1.015 (ref 1.000–1.030)
Total Protein, Urine: NEGATIVE
Urine Glucose: NEGATIVE
Urobilinogen, UA: 0.2 (ref 0.0–1.0)
pH: 7 (ref 5.0–8.0)

## 2019-06-09 LAB — BASIC METABOLIC PANEL
BUN: 18 mg/dL (ref 6–23)
CO2: 26 mEq/L (ref 19–32)
Calcium: 9 mg/dL (ref 8.4–10.5)
Chloride: 107 mEq/L (ref 96–112)
Creatinine, Ser: 0.98 mg/dL (ref 0.40–1.20)
GFR: 67.27 mL/min (ref >60.00)
Glucose, Bld: 108 mg/dL — ABNORMAL HIGH (ref 70–99)
Potassium: 4.6 mEq/L (ref 3.5–5.1)
Sodium: 139 mEq/L (ref 135–145)

## 2019-06-09 LAB — HEPATIC FUNCTION PANEL
ALT: 11 U/L (ref 0–35)
AST: 10 U/L (ref 0–37)
Albumin: 4 g/dL (ref 3.5–5.2)
Alkaline Phosphatase: 91 U/L (ref 39–117)
Bilirubin, Direct: 0.1 mg/dL (ref 0.0–0.3)
Total Bilirubin: 0.4 mg/dL (ref 0.2–1.2)
Total Protein: 6.9 g/dL (ref 6.0–8.3)

## 2019-06-09 LAB — TSH: TSH: 1.67 u[IU]/mL (ref 0.35–4.50)

## 2019-06-09 LAB — LIPID PANEL
Cholesterol: 152 mg/dL (ref 0–200)
HDL: 53.6 mg/dL (ref >39.00)
LDL Cholesterol: 88 mg/dL (ref 0–99)
NonHDL: 98.66
Total CHOL/HDL Ratio: 3
Triglycerides: 54 mg/dL (ref 0.0–149.0)
VLDL: 10.8 mg/dL (ref 0.0–40.0)

## 2019-06-09 MED ORDER — VERAPAMIL HCL ER 240 MG PO CP24: 240.0000 mg | ORAL_CAPSULE | Freq: Every day | ORAL | 3 refills | Status: DC

## 2019-06-09 MED ORDER — ATORVASTATIN CALCIUM 20 MG PO TABS: 20.0000 mg | ORAL_TABLET | Freq: Every day | ORAL | 3 refills | Status: DC

## 2019-06-09 MED ORDER — METFORMIN HCL 1000 MG PO TABS: ORAL_TABLET | ORAL | 3 refills | Status: DC

## 2019-06-09 MED ORDER — QUINAPRIL HCL 40 MG PO TABS: 40.0000 mg | ORAL_TABLET | Freq: Every day | ORAL | 3 refills | Status: DC

## 2019-06-09 MED ORDER — SITAGLIPTIN PHOSPHATE 100 MG PO TABS: ORAL_TABLET | ORAL | 3 refills | Status: DC

## 2019-06-09 MED ORDER — SPIRONOLACTONE 50 MG PO TABS: 50.0000 mg | ORAL_TABLET | Freq: Every day | ORAL | 3 refills | Status: DC

## 2019-06-09 MED FILL — METFORMIN HCL 1000 MG TABS: 1000 | 90 days supply | Qty: 180 | Fill #0

## 2019-06-09 MED FILL — VERAPAMIL ER 240 MG CAPSULE: 240 | 90 days supply | Qty: 90 | Fill #0

## 2019-06-09 MED FILL — JANUVIA 100 MG TABLET: 100 | 30 days supply | Qty: 30 | Fill #0

## 2019-06-09 MED FILL — SPIRONOLACTONE 50 MG TABLET: 50 | 90 days supply | Qty: 90 | Fill #0

## 2019-06-09 MED FILL — QUINAPRIL 40 MG TABLET: 40 | 90 days supply | Qty: 90 | Fill #0

## 2019-06-09 NOTE — Assessment & Plan Note (Signed)
.   Wt Readings from Last 3 Encounters:  06/09/19 203 lb (92.1 kg)  05/05/19 200 lb (90.7 kg)  12/10/18 205 lb (93 kg)

## 2019-06-09 NOTE — Assessment & Plan Note (Addendum)
F/u w/Dr Cruzita Lederer Metformin, Januvia On Lipitor

## 2019-06-09 NOTE — Assessment & Plan Note (Signed)
Tramadol prn ° Potential benefits of a long term opioids use as well as potential risks (i.e. addiction risk, apnea etc) and complications (i.e. Somnolence, constipation and others) were explained to the patient and were aknowledged. ° ° °

## 2019-06-09 NOTE — Assessment & Plan Note (Signed)
Dr Cruzita Lederer Metformin, Januvia, Lipitor

## 2019-06-09 NOTE — Progress Notes (Signed)
Subjective:  Patient ID: Katelyn Peterson, female    DOB: [birth year]  Age: 74 y.o. MRN: [id number]  CC: No chief complaint on file.   HPI Katelyn Peterson presents for HTN, DM, OA f/u  Outpatient Medications Prior to Visit  Medication Sig Dispense Refill  . atorvastatin (LIPITOR) 20 MG tablet Take 1 tablet (20 mg total) by mouth daily. 90 tablet 3  . cetirizine (ZYRTEC) 10 MG tablet TAKE ONE TABLET BY MOUTH ONCE DAILY 70 tablet 5  . Cholecalciferol 1000 UNITS tablet Take 1,000 Units by mouth daily.      . Diclofenac Sodium 2 % SOLN Apply 1 pump twice daily. 112 g 3  . ibuprofen (ADVIL,MOTRIN) 600 MG tablet TAKE 1 TABLET BY MOUTH TWICE DAILY AFTER MEALS FOR 1 WEEK, THEN AS NEEDED FOR PAIN 60 tablet 2  . JANUVIA 100 MG tablet TAKE 1 TABLET BY MOUTH ONCE DAILY IN THE MORNING 90 tablet 3  . loratadine (CLARITIN) 10 MG tablet Take 1 tablet (10 mg total) by mouth daily. 90 tablet 0  . Menthol, Topical Analgesic, (BIOFREEZE) 4 % GEL Apply to affected area daily as needed 150 mL 0  . metFORMIN (GLUCOPHAGE) 1000 MG tablet TAKE 1 TABLET BY MOUTH TWICE DAILY WITH A MEAL 180 tablet 3  . OVER THE COUNTER MEDICATION Goody Powder for headaches. PRN    . quinapril (ACCUPRIL) 40 MG tablet TAKE 1 TABLET BY MOUTH ONCE DAILY 90 tablet 3  . spironolactone (ALDACTONE) 50 MG tablet TAKE 1 TABLET (50 MG TOTAL) BY MOUTH DAILY 90 tablet 3  . traMADol (ULTRAM) 50 MG tablet TAKE 1 TO 2 TABLETS BY MOUTH TWO TIMES DAILY AS NEEDED FOR SEVERE PAIN 100 tablet 1  . verapamil (VERELAN PM) 240 MG 24 hr capsule TAKE 1 CAPSULE BY MOUTH AT BEDTIME 90 capsule 1   Facility-Administered Medications Prior to Visit  Medication Dose Route Frequency Provider Last Rate Last Admin  . 0.9 %  sodium chloride infusion  500 mL Intravenous Continuous Milus Banister, MD        ROS: Review of Systems  Constitutional: Negative for activity change, appetite change, chills, fatigue and unexpected weight change.  HENT: Negative for  congestion, mouth sores and sinus pressure.   Eyes: Negative for visual disturbance.  Respiratory: Negative for cough and chest tightness.   Gastrointestinal: Negative for abdominal pain and nausea.  Genitourinary: Negative for difficulty urinating, frequency and vaginal pain.  Musculoskeletal: Positive for arthralgias and gait problem. Negative for back pain.  Skin: Negative for pallor and rash.  Neurological: Negative for dizziness, tremors, weakness, numbness and headaches.  Psychiatric/Behavioral: Negative for confusion, sleep disturbance and suicidal ideas.    Objective:  BP (!) 148/88 (BP Location: Left Arm, Patient Position: Sitting, Cuff Size: Normal)   Pulse 64   Temp 97.7 F (36.5 C) (Oral)   Ht 5\' 4"  (1.626 m)   Wt 203 lb (92.1 kg)   SpO2 97%   BMI 34.84 kg/m   BP Readings from Last 3 Encounters:  06/09/19 (!) 148/88  05/05/19 128/88  12/10/18 (!) 144/82    Wt Readings from Last 3 Encounters:  06/09/19 203 lb (92.1 kg)  05/05/19 200 lb (90.7 kg)  12/10/18 205 lb (93 kg)    Physical Exam Constitutional:      General: She is not in acute distress.    Appearance: She is well-developed.  HENT:     Head: Normocephalic.     Right Ear: External ear normal.  Left Ear: External ear normal.     Nose: Nose normal.  Eyes:     General:        Right eye: No discharge.        Left eye: No discharge.     Conjunctiva/sclera: Conjunctivae normal.     Pupils: Pupils are equal, round, and reactive to light.  Neck:     Thyroid: No thyromegaly.     Vascular: No JVD.     Trachea: No tracheal deviation.  Cardiovascular:     Rate and Rhythm: Normal rate and regular rhythm.     Heart sounds: Normal heart sounds.  Pulmonary:     Effort: No respiratory distress.     Breath sounds: No stridor. No wheezing.  Abdominal:     General: Bowel sounds are normal. There is no distension.     Palpations: Abdomen is soft. There is no mass.     Tenderness: There is no abdominal  tenderness. There is no guarding or rebound.  Musculoskeletal:        General: No tenderness.     Cervical back: Normal range of motion and neck supple.  Lymphadenopathy:     Cervical: No cervical adenopathy.  Skin:    Findings: No erythema or rash.  Neurological:     Cranial Nerves: No cranial nerve deficit.     Motor: No abnormal muscle tone.     Coordination: Coordination normal.     Deep Tendon Reflexes: Reflexes normal.  Psychiatric:        Behavior: Behavior normal.        Thought Content: Thought content normal.        Judgment: Judgment normal.     Lab Results  Component Value Date   WBC 5.8 06/10/2018   HGB 10.8 (L) 06/10/2018   HCT 32.8 (L) 06/10/2018   PLT 259.0 06/10/2018   GLUCOSE 82 06/10/2018   CHOL 232 (H) 06/10/2018   TRIG 60.0 06/10/2018   HDL 62.00 06/10/2018   LDLDIRECT 146.5 09/18/2012   LDLCALC 158 (H) 06/10/2018   ALT 9 06/10/2018   AST 9 06/10/2018   NA 139 06/10/2018   K 4.8 06/10/2018   CL 106 06/10/2018   CREATININE 1.05 06/10/2018   BUN 22 06/10/2018   CO2 25 06/10/2018   TSH 1.62 06/10/2018   HGBA1C 6.2 (A) 05/05/2019   MICROALBUR 1.1 06/10/2018    DG Knee 1-2 Views Right  Result Date: 06/20/2017 CLINICAL DATA:  Right knee pain and swelling for several days, no known injury, initial encounter EXAM: RIGHT KNEE - 2 VIEW COMPARISON:  None. FINDINGS: Tricompartmental degenerative changes are noted worst in the medial joint space. Multiple well calcified densities are identified in the suprapatellar bursa consistent with loose bodies. Some additional loose bodies are also noted in the posterior aspect of the joint. 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 No acute abnormality is noted. Electronically Signed   By: Inez Catalina M.D.   On: 06/20/2017 08:33    Assessment & Plan:    Walker Kehr, MD

## 2019-08-17 DIAGNOSIS — H5203 Hypermetropia, bilateral: Secondary | ICD-10-CM | POA: Diagnosis not present

## 2019-08-17 DIAGNOSIS — E119 Type 2 diabetes mellitus without complications: Secondary | ICD-10-CM | POA: Diagnosis not present

## 2019-08-17 DIAGNOSIS — H25013 Cortical age-related cataract, bilateral: Secondary | ICD-10-CM | POA: Diagnosis not present

## 2019-08-17 DIAGNOSIS — E113293 Type 2 diabetes mellitus with mild nonproliferative diabetic retinopathy without macular edema, bilateral: Secondary | ICD-10-CM | POA: Diagnosis not present

## 2019-08-17 DIAGNOSIS — H2513 Age-related nuclear cataract, bilateral: Secondary | ICD-10-CM | POA: Diagnosis not present

## 2019-08-17 DIAGNOSIS — H25043 Posterior subcapsular polar age-related cataract, bilateral: Secondary | ICD-10-CM | POA: Diagnosis not present

## 2019-08-17 DIAGNOSIS — H524 Presbyopia: Secondary | ICD-10-CM | POA: Diagnosis not present

## 2019-08-17 DIAGNOSIS — Z7984 Long term (current) use of oral hypoglycemic drugs: Secondary | ICD-10-CM | POA: Diagnosis not present

## 2019-08-17 DIAGNOSIS — H52203 Unspecified astigmatism, bilateral: Secondary | ICD-10-CM | POA: Diagnosis not present

## 2019-08-28 MED FILL — traMADol HCL 50 MG TABS: 50 | 25 days supply | Qty: 100 | Fill #1

## 2019-09-18 MED FILL — JANUVIA 100 MG TABLET: 100 | 30 days supply | Qty: 30 | Fill #2

## 2019-09-29 DIAGNOSIS — Z1231 Encounter for screening mammogram for malignant neoplasm of breast: Secondary | ICD-10-CM | POA: Diagnosis not present

## 2019-10-19 MED FILL — SPIRONOLACTONE 50 MG TABLET: 50 | 90 days supply | Qty: 90 | Fill #1

## 2019-10-19 MED FILL — JANUVIA 100 MG TABLET: 100 | 30 days supply | Qty: 30 | Fill #3

## 2019-10-21 MED FILL — QUINAPRIL 40 MG TABLET: 40 | 90 days supply | Qty: 90 | Fill #1

## 2019-11-03 ENCOUNTER — Other Ambulatory Visit: Payer: Self-pay

## 2019-11-03 ENCOUNTER — Ambulatory Visit: Payer: 59 | Admitting: Internal Medicine

## 2019-11-03 ENCOUNTER — Encounter: Payer: Self-pay | Admitting: Internal Medicine

## 2019-11-03 VITALS — BP 132/80 | HR 76 | Ht 64.0 in | Wt 197.0 lb

## 2019-11-03 DIAGNOSIS — E113592 Type 2 diabetes mellitus with proliferative diabetic retinopathy without macular edema, left eye: Secondary | ICD-10-CM | POA: Diagnosis not present

## 2019-11-03 DIAGNOSIS — Z6835 Body mass index (BMI) 35.0-35.9, adult: Secondary | ICD-10-CM

## 2019-11-03 DIAGNOSIS — E1169 Type 2 diabetes mellitus with other specified complication: Secondary | ICD-10-CM

## 2019-11-03 DIAGNOSIS — E785 Hyperlipidemia, unspecified: Secondary | ICD-10-CM | POA: Diagnosis not present

## 2019-11-03 LAB — POCT GLYCOSYLATED HEMOGLOBIN (HGB A1C): Hemoglobin A1C: 6.1 % — AB (ref 4.0–5.6)

## 2019-11-03 NOTE — Addendum Note (Signed)
Addended by: Cardell Peach I on: 11/03/2019 11:16 AM   Modules accepted: Orders

## 2019-11-03 NOTE — Progress Notes (Signed)
Patient ID: Katelyn Peterson, female   DOB: 07/07/1945, 74 y.o.   MRN: [id number]  This visit occurred during the SARS-CoV-2 public health emergency.  Safety protocols were in place, including screening questions prior to the visit, additional usage of staff PPE, and extensive cleaning of exam room while observing appropriate contact time as indicated for disinfecting solutions.   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 Last visit 6 months ago.  Reviewed her HbA1c levels: Lab Results  Component Value Date   HGBA1C 6.2 (A) 05/05/2019   HGBA1C 6.1 (A) 10/31/2018   HGBA1C 6.2 (A) 05/02/2018   Pt is on a regimen of: - Metformin 1000 mg 2x a day - Januvia 100 mg daily in am We stopped Amaryl 2 mg bid and Actos 30 mg daily for daily hypoglycemia events and possible SEs, respectively.  Pt checks her sugars once a day in the morning: - am: 87-120 >> 80-114 >> 88, 92-110 >> 71, 78, 80-108, 120 - 2h after b'fast: n/c >> 131 >> n/c >> 114 >> n/c - before lunch: n/c >> 93 >> 100-110 >> n/c - 2h after lunch: 1n/c>> 110, 121 >> n/c - before dinner: 105, 115 >> n/c >> 102 >> n/c - 2h after dinner: 135, 135 >> n/c - bedtime:   90 >> 70 >> n/c Lowest sugar was 80 >> 88 >> 71;  she has hypoglycemia awareness in the 60s. Highest sugar was 114 >> 110 >> 120.  -No CKD, last BUN/creatinine:  Lab Results  Component Value Date   BUN 18 06/09/2019   CREATININE 0.98 06/09/2019  On quinapril 40.  ACR normal: Lab Results  Component Value Date   MICRALBCREAT 2.3 06/09/2019   MICRALBCREAT 0.9 06/10/2018   MICRALBCREAT 1.5 01/07/2017   MICRALBCREAT 0.9 05/08/2016   -+ HL; last set of lipids: Lab Results  Component Value Date   CHOL 152 06/09/2019   HDL 53.60 06/09/2019   LDLCALC 88 06/09/2019   LDLDIRECT 146.5 09/18/2012   TRIG 54.0 06/09/2019   CHOLHDL 3 06/09/2019  She had  leg cramps with statins: Rosuvastatin, pravastatin, atorvastatin and fluvastatin XL.  She refused to try Zetia.  However, she now tolerates reasonably well Lipitor low-dose every other day-she uses a muscle rub cream for cramps.  - last eye exam was on 06/09/2019: + L PDR, no macular edema; Dr. Edyth Gunnels.  - no numbness and tingling in her feet.  She also has a history of HTN. She has been considered for lap band surgery >> she did not want to follow through with thiss. She also has bilateral chronic lower extremity cramps, right knee pain, anemia.  Last TSH was normal: Lab Results  Component Value Date   TSH 1.67 06/09/2019   ROS: Constitutional: no weight gain/+ weight loss, no fatigue, no subjective hyperthermia, no subjective hypothermia Eyes: no blurry vision, no xerophthalmia ENT: no sore throat, no nodules palpated in neck, no dysphagia, no odynophagia, no hoarseness Cardiovascular: no CP/no SOB/no palpitations/no leg swelling Respiratory: no cough/no SOB/no wheezing Gastrointestinal: no N/no V/no D/no C/no acid reflux Musculoskeletal: no muscle aches/no joint aches Skin: no rashes, no hair loss Neurological: no tremors/no numbness/no tingling/no dizziness  I reviewed pt's medications, allergies, PMH, social hx, family hx, and changes were documented in the history of present illness. Otherwise, unchanged from my initial visit note.  Past Medical History:  Diagnosis Date   Arthritis  HTN (hypertension)    Hyperlipidemia    Obesity    Type II or unspecified type diabetes mellitus without mention of complication, not stated as uncontrolled    Past Surgical History:  Procedure Laterality Date   ABDOMINAL HYSTERECTOMY     Social History   Socioeconomic History   Marital status: Married    Spouse name: Not on file   Number of children: Not on file   Years of education: Not on file   Highest education level: Not on file  Occupational History   Occupation:  Public house manager  Tobacco Use   Smoking status: Never Smoker   Smokeless tobacco: Never Used  Substance and Sexual Activity   Alcohol use: No   Drug use: No   Sexual activity: Yes    Partners: Male  Other Topics Concern   Not on file  Social History Narrative   Regular exercise: seldom   Caffeine use: occasionally         Social Determinants of Health   Financial Resource Strain:    Difficulty of Paying Living Expenses: Not on file  Food Insecurity:    Worried About Charity fundraiser in the Last Year: Not on file   [employer] of Food in the Last Year: Not on file  Transportation Needs:    Lack of Transportation (Medical): Not on file   Lack of Transportation (Non-Medical): Not on file  Physical Activity:    Days of Exercise per Week: Not on file   Minutes of Exercise per Session: Not on file  Stress:    Feeling of Stress : Not on file  Social Connections:    Frequency of Communication with Friends and Family: Not on file   Frequency of Social Gatherings with Friends and Family: Not on file   Attends Religious Services: Not on file   Active Member of Clubs or Organizations: Not on file   Attends Archivist Meetings: Not on file   Marital Status: Not on file  Intimate Partner Violence:    Fear of Current or Ex-Partner: Not on file   Emotionally Abused: Not on file   Physically Abused: Not on file   Sexually Abused: Not on file   Current Outpatient Medications on File Prior to Visit  Medication Sig Dispense Refill   atorvastatin (LIPITOR) 20 MG tablet Take 1 tablet (20 mg total) by mouth daily. 90 tablet 3   cetirizine (ZYRTEC) 10 MG tablet TAKE ONE TABLET BY MOUTH ONCE DAILY 70 tablet 5   Cholecalciferol 1000 UNITS tablet Take 1,000 Units by mouth daily.       Diclofenac Sodium 2 % SOLN Apply 1 pump twice daily. 112 g 3   ibuprofen (ADVIL,MOTRIN) 600 MG tablet TAKE 1 TABLET BY MOUTH TWICE DAILY AFTER MEALS FOR 1 WEEK, THEN AS  NEEDED FOR PAIN 60 tablet 2   loratadine (CLARITIN) 10 MG tablet Take 1 tablet (10 mg total) by mouth daily. 90 tablet 0   Menthol, Topical Analgesic, (BIOFREEZE) 4 % GEL Apply to affected area daily as needed 150 mL 0   metFORMIN (GLUCOPHAGE) 1000 MG tablet TAKE 1 TABLET BY MOUTH TWICE DAILY WITH A MEAL 180 tablet 3   OVER THE COUNTER MEDICATION Goody Powder for headaches. PRN     quinapril (ACCUPRIL) 40 MG tablet Take 1 tablet (40 mg total) by mouth daily. 90 tablet 3   sitaGLIPtin (JANUVIA) 100 MG tablet TAKE 1 TABLET BY MOUTH ONCE DAILY IN THE MORNING 90 tablet 3  spironolactone (ALDACTONE) 50 MG tablet Take 1 tablet (50 mg total) by mouth daily. 90 tablet 3   traMADol (ULTRAM) 50 MG tablet TAKE 1 TO 2 TABLETS BY MOUTH TWO TIMES DAILY AS NEEDED FOR SEVERE PAIN 100 tablet 1   verapamil (VERELAN PM) 240 MG 24 hr capsule Take 1 capsule (240 mg total) by mouth at bedtime. 90 capsule 3   Current Facility-Administered Medications on File Prior to Visit  Medication Dose Route Frequency Provider Last Rate Last Admin   0.9 %  sodium chloride infusion  500 mL Intravenous Continuous Milus Banister, MD       Allergies  Allergen Reactions   Atorvastatin     REACTION: cramps   Hydrochlorothiazide     REACTION: Cramps   Pravastatin     cramps   Family History  Problem Relation Age of Onset   Hypertension Mother    Diabetes Mother    Hypertension Father    Hypertension Other    Colon cancer Neg Hx    Esophageal cancer Neg Hx    Rectal cancer Neg Hx    Stomach cancer Neg Hx    PE: There were no vitals taken for this visit. There is no height or weight on file to calculate BMI.  Wt Readings from Last 3 Encounters:  11/03/19 197 lb (89.4 kg)  06/09/19 203 lb (92.1 kg)  05/05/19 200 lb (90.7 kg)   Constitutional: overweight, in NAD Eyes: PERRLA, EOMI, no exophthalmos ENT: moist mucous membranes, no thyromegaly, no cervical lymphadenopathy Cardiovascular: RRR, No  MRG Respiratory: CTA B Gastrointestinal: abdomen soft, NT, ND, BS+ Musculoskeletal: no deformities, strength intact in all 4 Skin: moist, warm, no rashes Neurological: no tremor with outstretched hands, DTR normal in all 4  ASSESSMENT: 1. 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 Hyperlipidemia - Developed leg cramps with pravastatin, atorvastatin, Crestor, fluvastatin  3.  Obesity class II  4. Goiter by palpation 11/04/2012: Thyroid U/S:  Right thyroid lobe: 4.3 x 1.0 x 1.8 cm   Left thyroid lobe: 4.6 x 1.2 x 1.9 cm   Isthmus: 5 mm   Focal nodules: Thyroid echotexture is homogeneous, without solid or cystic lesion.   Lymphadenopathy: None visualized.  IMPRESSION:  Normal thyroid ultrasound. No evidence of thyromegaly or dominant solid/cystic mass.  -No further intervention needed for this  PLAN:  1. DM2 -Patient with longstanding, well-controlled, type 2 diabetes, on oral antidiabetic regimen with good control. At last visit HbA1c was excellent, at 6.2% and I did not change her regimen at that time. -At today's visit, no hyper or hypoglycemic episodes.  She continues to only check sugars in the morning and they are all at goal, even slightly better than before.  At previous visits and again today I advised her to check some sugars later in the day, also, but she did not do so.  -She has no side effects from Metformin and Januvia.  We will can continue the current regimen. - I suggested to:  Patient Instructions  Please continue: - Metformin 1000 mg 2x a day - Januvia 100 mg daily in am  Please come back for a follow-up appointment in 6 months.  - we checked her HbA1c: 6.1% (stable, excellent) - advised to check sugars at different times of the day - 1x a day, rotating check times - advised for yearly eye exams >> she is UTD - return to clinic in 6 months  2. Hyperlipidemia -Reviewing the lipid panel from 05/2019: LDL  above our target of  less than 70, the rest of the fractions at goal Lab Results  Component Value Date   CHOL 152 06/09/2019   HDL 53.60 06/09/2019   LDLCALC 88 06/09/2019   LDLDIRECT 146.5 09/18/2012   TRIG 54.0 06/09/2019   CHOLHDL 3 06/09/2019  -She had leg cramps from Crestor 5, pravastatin, atorvastatin, fluvastatin XL in the past.  She was willing to restart Lipitor and we initially started on 20 mg every 2 to 3 days and I advised her to move it closer together if she tolerates it well.  We discussed in the past about possibly using co-Q10 and magnesium to help with muscle aches  -She now takes Lipitor every other day  3.  Obesity class II -Continue Metformin and Januvia which are weight neutral and not weight inducing -She lost 5 pounds before last visit, lost 3 more lbs since last OV  Philemon Kingdom, MD PhD [facility] Endocrinology

## 2019-11-03 NOTE — Patient Instructions (Signed)
Please continue: - Metformin 1000 mg 2x a day - Januvia 100 mg daily in am  Please come back for a follow-up appointment in 6 months.

## 2019-12-02 DIAGNOSIS — M1711 Unilateral primary osteoarthritis, right knee: Secondary | ICD-10-CM | POA: Diagnosis not present

## 2019-12-04 MED FILL — JANUVIA 100 MG TABLET: 100 | 30 days supply | Qty: 30 | Fill #4

## 2019-12-09 DIAGNOSIS — M1711 Unilateral primary osteoarthritis, right knee: Secondary | ICD-10-CM | POA: Diagnosis not present

## 2019-12-10 ENCOUNTER — Encounter: Payer: Self-pay | Admitting: Internal Medicine

## 2019-12-10 ENCOUNTER — Other Ambulatory Visit: Payer: Self-pay

## 2019-12-10 ENCOUNTER — Ambulatory Visit (INDEPENDENT_AMBULATORY_CARE_PROVIDER_SITE_OTHER): Payer: 59 | Admitting: Internal Medicine

## 2019-12-10 VITALS — BP 160/78 | HR 73 | Temp 98.1°F | Ht 64.0 in | Wt 198.0 lb

## 2019-12-10 DIAGNOSIS — I10 Essential (primary) hypertension: Secondary | ICD-10-CM

## 2019-12-10 DIAGNOSIS — Z6835 Body mass index (BMI) 35.0-35.9, adult: Secondary | ICD-10-CM

## 2019-12-10 DIAGNOSIS — M25561 Pain in right knee: Secondary | ICD-10-CM | POA: Diagnosis not present

## 2019-12-10 DIAGNOSIS — E66812 Obesity, class 2: Secondary | ICD-10-CM

## 2019-12-10 DIAGNOSIS — Z0000 Encounter for general adult medical examination without abnormal findings: Secondary | ICD-10-CM

## 2019-12-10 DIAGNOSIS — Z23 Encounter for immunization: Secondary | ICD-10-CM | POA: Diagnosis not present

## 2019-12-10 DIAGNOSIS — E113592 Type 2 diabetes mellitus with proliferative diabetic retinopathy without macular edema, left eye: Secondary | ICD-10-CM

## 2019-12-10 MED ORDER — TRAMADOL HCL 50 MG PO TABS: 50.0000 mg | ORAL_TABLET | Freq: Four times a day (QID) | ORAL | 2 refills | Status: DC

## 2019-12-10 MED FILL — traMADol HCL 50 MG TABS: 50 | 25 days supply | Qty: 100 | Fill #0

## 2019-12-10 NOTE — Progress Notes (Addendum)
Subjective:  Patient ID: Katelyn Peterson, female    DOB: 05/11/1945  Age: 74 y.o. MRN: [id number]  CC: No chief complaint on file.   HPI Katelyn Peterson presents for controlled DM, HTN with normal blood pressure readings at home, OA with moderate symptoms f/u  Outpatient Medications Prior to Visit  Medication Sig Dispense Refill  . atorvastatin (LIPITOR) 20 MG tablet Take 1 tablet (20 mg total) by mouth daily. 90 tablet 3  . cetirizine (ZYRTEC) 10 MG tablet TAKE ONE TABLET BY MOUTH ONCE DAILY 70 tablet 5  . Cholecalciferol 1000 UNITS tablet Take 1,000 Units by mouth daily.      . Diclofenac Sodium 2 % SOLN Apply 1 pump twice daily. 112 g 3  . ibuprofen (ADVIL,MOTRIN) 600 MG tablet TAKE 1 TABLET BY MOUTH TWICE DAILY AFTER MEALS FOR 1 WEEK, THEN AS NEEDED FOR PAIN 60 tablet 2  . loratadine (CLARITIN) 10 MG tablet Take 1 tablet (10 mg total) by mouth daily. 90 tablet 0  . Menthol, Topical Analgesic, (BIOFREEZE) 4 % GEL Apply to affected area daily as needed 150 mL 0  . metFORMIN (GLUCOPHAGE) 1000 MG tablet TAKE 1 TABLET BY MOUTH TWICE DAILY WITH A MEAL 180 tablet 3  . OVER THE COUNTER MEDICATION Goody Powder for headaches. PRN    . quinapril (ACCUPRIL) 40 MG tablet Take 1 tablet (40 mg total) by mouth daily. 90 tablet 3  . sitaGLIPtin (JANUVIA) 100 MG tablet TAKE 1 TABLET BY MOUTH ONCE DAILY IN THE MORNING 90 tablet 3  . spironolactone (ALDACTONE) 50 MG tablet Take 1 tablet (50 mg total) by mouth daily. 90 tablet 3  . traMADol (ULTRAM) 50 MG tablet TAKE 1 TO 2 TABLETS BY MOUTH TWO TIMES DAILY AS NEEDED FOR SEVERE PAIN 100 tablet 1  . verapamil (VERELAN PM) 240 MG 24 hr capsule Take 1 capsule (240 mg total) by mouth at bedtime. 90 capsule 3   Facility-Administered Medications Prior to Visit  Medication Dose Route Frequency Provider Last Rate Last Admin  . 0.9 %  sodium chloride infusion  500 mL Intravenous Continuous Milus Banister, MD        ROS: Review of Systems    Constitutional: Negative for activity change, appetite change, chills, fatigue and unexpected weight change.  HENT: Negative for congestion, mouth sores and sinus pressure.   Eyes: Negative for visual disturbance.  Respiratory: Negative for cough and chest tightness.   Gastrointestinal: Negative for abdominal pain and nausea.  Genitourinary: Negative for difficulty urinating, frequency and vaginal pain.  Musculoskeletal: Positive for arthralgias and gait problem. Negative for back pain.  Skin: Negative for pallor and rash.  Neurological: Negative for dizziness, tremors, weakness, numbness and headaches.  Psychiatric/Behavioral: Negative for confusion and sleep disturbance.    Objective:  BP (!) 160/78 (BP Location: Right Arm, Patient Position: Sitting, Cuff Size: Large)   Pulse 73   Temp 98.1 F (36.7 C) (Oral)   Ht 5\' 4"  (1.626 m)   Wt 198 lb (89.8 kg)   SpO2 94%   BMI 33.99 kg/m   BP Readings from Last 3 Encounters:  12/10/19 (!) 160/78  11/03/19 132/80  06/09/19 (!) 148/88    Wt Readings from Last 3 Encounters:  12/10/19 198 lb (89.8 kg)  11/03/19 197 lb (89.4 kg)  06/09/19 203 lb (92.1 kg)    Physical Exam Constitutional:      General: She is not in acute distress.    Appearance: She is well-developed. She is  obese.  HENT:     Head: Normocephalic.     Right Ear: External ear normal.     Left Ear: External ear normal.     Nose: Nose normal.  Eyes:     General:        Right eye: No discharge.        Left eye: No discharge.     Conjunctiva/sclera: Conjunctivae normal.     Pupils: Pupils are equal, round, and reactive to light.  Neck:     Thyroid: No thyromegaly.     Vascular: No JVD.     Trachea: No tracheal deviation.  Cardiovascular:     Rate and Rhythm: Normal rate and regular rhythm.     Heart sounds: Normal heart sounds.  Pulmonary:     Effort: No respiratory distress.     Breath sounds: No stridor. No wheezing.  Abdominal:     General: Bowel  sounds are normal. There is no distension.     Palpations: Abdomen is soft. There is no mass.     Tenderness: There is no abdominal tenderness. There is no guarding or rebound.  Musculoskeletal:        General: No tenderness.     Cervical back: Normal range of motion and neck supple.  Lymphadenopathy:     Cervical: No cervical adenopathy.  Skin:    Findings: No erythema or rash.  Neurological:     Mental Status: She is oriented to person, place, and time.     Cranial Nerves: No cranial nerve deficit.     Motor: No abnormal muscle tone.     Coordination: Coordination normal.     Gait: Gait abnormal.     Deep Tendon Reflexes: Reflexes normal.  Psychiatric:        Behavior: Behavior normal.        Thought Content: Thought content normal.        Judgment: Judgment normal.     Lab Results  Component Value Date   WBC 5.8 06/09/2019   HGB 10.1 (L) 06/09/2019   HCT 30.8 (L) 06/09/2019   PLT 233.0 06/09/2019   GLUCOSE 108 (H) 06/09/2019   CHOL 152 06/09/2019   TRIG 54.0 06/09/2019   HDL 53.60 06/09/2019   LDLDIRECT 146.5 09/18/2012   LDLCALC 88 06/09/2019   ALT 11 06/09/2019   AST 10 06/09/2019   NA 139 06/09/2019   K 4.6 06/09/2019   CL 107 06/09/2019   CREATININE 0.98 06/09/2019   BUN 18 06/09/2019   CO2 26 06/09/2019   TSH 1.67 06/09/2019   HGBA1C 6.1 (A) 11/03/2019   MICROALBUR 1.0 06/09/2019    DG Knee 1-2 Views Right  Result Date: 06/20/2017 CLINICAL DATA:  Right knee pain and swelling for several days, no known injury, initial encounter EXAM: RIGHT KNEE - 2 VIEW COMPARISON:  None. FINDINGS: Tricompartmental degenerative changes are noted worst in the medial joint space. Multiple well calcified densities are identified in the suprapatellar bursa consistent with loose bodies. Some additional loose bodies are also noted in the posterior aspect of the joint. 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 No acute abnormality is noted.  Electronically Signed   By: Inez Catalina M.D.   On: 06/20/2017 08:33    Assessment & Plan:    Walker Kehr, MD

## 2019-12-10 NOTE — Addendum Note (Signed)
Addended by: Lauralee Evener C on: 12/10/2019 11:34 AM   Modules accepted: Orders

## 2019-12-13 NOTE — Assessment & Plan Note (Signed)
Continue current therapy with spironolactone, quinapril, verapamil.  The patient states blood pressure is normal at home.

## 2019-12-13 NOTE — Assessment & Plan Note (Signed)
Wt Readings from Last 3 Encounters:  12/10/19 198 lb (89.8 kg)  11/03/19 197 lb (89.4 kg)  06/09/19 203 lb (92.1 kg)

## 2019-12-13 NOTE — Assessment & Plan Note (Signed)
Chronic arthritic symptoms.  Tramadol as needed for severe pain

## 2019-12-16 ENCOUNTER — Ambulatory Visit (INDEPENDENT_AMBULATORY_CARE_PROVIDER_SITE_OTHER): Payer: 59 | Admitting: Family Medicine

## 2019-12-16 ENCOUNTER — Encounter: Payer: Self-pay | Admitting: Family Medicine

## 2019-12-16 ENCOUNTER — Other Ambulatory Visit: Payer: Self-pay

## 2019-12-16 ENCOUNTER — Other Ambulatory Visit: Payer: Self-pay | Admitting: Family Medicine

## 2019-12-16 VITALS — BP 110/62 | HR 92

## 2019-12-16 DIAGNOSIS — S161XXA Strain of muscle, fascia and tendon at neck level, initial encounter: Secondary | ICD-10-CM | POA: Diagnosis not present

## 2019-12-16 DIAGNOSIS — M545 Low back pain, unspecified: Secondary | ICD-10-CM | POA: Diagnosis not present

## 2019-12-16 MED ORDER — CYCLOBENZAPRINE HCL 5 MG PO TABS: 5.0000 mg | ORAL_TABLET | Freq: Every evening | ORAL | 0 refills | Status: DC | PRN

## 2019-12-16 MED FILL — CYCLOBENZAPRINE HCL 5 MG TA: 5 | 30 days supply | Qty: 30 | Fill #0

## 2019-12-16 NOTE — Patient Instructions (Addendum)
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 strengthens the muscles that lie deep in the abdomen. 5. Lie on your back on a firm surface. Bend your knees and keep your feet flat on the floor. 6. Tense your abdominal muscles. Tip your pelvis up toward the ceiling and flatten your lower back into the floor. ? To help with this exercise, you may place a small towel under your lower back and try to push your back into the towel. 7. Hold this position for __________ seconds. 8. Let your muscles relax completely before you repeat this exercise. Repeat __________ times. Complete this exercise __________ times a day. Alternating arm and leg raises  6. Get on your hands and knees on a firm surface. If you are on a hard floor, you may want to use padding, such as an exercise mat, to cushion your knees. 7. Line up your arms and legs. Your hands should be directly below your shoulders, and your knees  should be directly below your hips. 8. Lift your left leg behind you. At the same time, raise your right arm and straighten it in front of you. ? Do not lift your leg higher than your hip. ? Do not lift your arm higher than your shoulder. ? Keep your abdominal and back muscles tight. ? Keep your hips facing the ground. ? Do not arch your back. ? Keep your balance carefully, and do not hold your breath. 9. Hold this position for __________ seconds. 10. Slowly return to the starting position. 11. Repeat with your right leg and your left arm. Repeat __________ times. Complete this exercise __________ times a day. Abdominal set with straight leg raise  4. Lie on your back on a firm surface. 5. Bend one of your knees and keep your other leg straight. 6. Tense your abdominal muscles and lift your straight leg up, 4-6 inches (10-15 cm) off the ground. 7. Keep your abdominal muscles tight and hold this position for __________ seconds. ? Do not hold your breath. ? Do not arch your back. Keep it flat against the ground. 8. Keep your abdominal muscles tense as you slowly lower your leg back to the starting position. 9. Repeat with your other leg. Repeat __________ times. Complete this exercise __________ times a day. Single leg lower with bent knees 1. Lie on your back on a firm surface. 2. Tense your abdominal muscles and lift your feet off the floor, one foot at a time, so your knees and hips are bent in 90-degree angles (right angles). ? Your knees should be over your hips and your lower legs should be parallel to the floor. 3. Keeping your abdominal muscles tense and  your knee bent, slowly lower one of your legs so your toe touches the ground. 4. Lift your leg back up to return to the starting position. ? Do not hold your breath. ? Do not let your back arch. Keep your back flat against the ground. 5. Repeat with your other leg. Repeat __________ times. Complete this exercise __________ times  a day. Posture and body mechanics Good posture and healthy body mechanics can help to relieve stress in your body's tissues and joints. Body mechanics refers to the movements and positions of your body while you do your daily activities. Posture is part of body mechanics. Good posture means:  Your spine is in its natural S-curve position (neutral).  Your shoulders are pulled back slightly.  Your head is not tipped forward. Follow these guidelines to improve your posture and body mechanics in your everyday activities. Standing   When standing, keep your spine neutral and your feet about hip width apart. Keep a slight bend in your knees. Your ears, shoulders, and hips should line up.  When you do a task in which you stand in one place for a long time, place one foot up on a stable object that is 2-4 inches (5-10 cm) high, such as a footstool. This helps keep your spine neutral. Sitting   When sitting, keep your spine neutral and keep your feet flat on the floor. Use a footrest, if necessary, and keep your thighs parallel to the floor. Avoid rounding your shoulders, and avoid tilting your head forward.  When working at a desk or a computer, keep your desk at a height where your hands are slightly lower than your elbows. Slide your chair under your desk so you are close enough to maintain good posture.  When working at a computer, place your monitor at a height where you are looking straight ahead and you do not have to tilt your head forward or downward to look at the screen. Resting  When lying down and resting, avoid positions that are most painful for you.  If you have pain with activities such as sitting, bending, stooping, or squatting, lie in a position in which your body does not bend very much. For example, avoid curling up on your side with your arms and knees near your chest (fetal position).  If you have pain with activities such as standing for a long time or reaching with your  arms, lie with your spine in a neutral position and bend your knees slightly. Try the following positions: ? Lying on your side with a pillow between your knees. ? Lying on your back with a pillow under your knees. Lifting   When lifting objects, keep your feet at least shoulder width apart and tighten your abdominal muscles.  Bend your knees and hips and keep your spine neutral. It is important to lift using the strength of your legs, not your back. Do not lock your knees straight out.  Always ask for help to lift heavy or awkward objects. This information is not intended to replace advice given to you by your health care provider. Make sure you discuss any questions you have with your health care provider. Document Revised: 06/20/2018 Document Reviewed: 03/20/2018 Elsevier Patient Education  [place].
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
## 2019-12-18 ENCOUNTER — Ambulatory Visit (INDEPENDENT_AMBULATORY_CARE_PROVIDER_SITE_OTHER): Payer: 59

## 2019-12-18 ENCOUNTER — Other Ambulatory Visit: Payer: Self-pay | Admitting: Family Medicine

## 2019-12-18 DIAGNOSIS — M1711 Unilateral primary osteoarthritis, right knee: Secondary | ICD-10-CM | POA: Diagnosis not present

## 2019-12-18 DIAGNOSIS — M542 Cervicalgia: Secondary | ICD-10-CM

## 2019-12-18 DIAGNOSIS — M545 Low back pain, unspecified: Secondary | ICD-10-CM | POA: Diagnosis not present

## 2020-01-04 ENCOUNTER — Encounter: Payer: Self-pay | Admitting: Family Medicine

## 2020-01-04 ENCOUNTER — Other Ambulatory Visit: Payer: Self-pay

## 2020-01-04 ENCOUNTER — Ambulatory Visit (INDEPENDENT_AMBULATORY_CARE_PROVIDER_SITE_OTHER): Payer: 59 | Admitting: Family Medicine

## 2020-01-04 VITALS — BP 120/60 | HR 71 | Temp 98.5°F

## 2020-01-04 DIAGNOSIS — M5441 Lumbago with sciatica, right side: Secondary | ICD-10-CM | POA: Diagnosis not present

## 2020-01-04 NOTE — Patient Instructions (Signed)
Sciatica  Sciatica is pain, numbness, weakness, or tingling along the path of the sciatic nerve. The sciatic nerve starts in the lower back and runs down the back of each leg. The nerve controls the muscles in the lower leg and in the back of the knee. It also provides feeling (sensation) to the back of the thigh, the lower leg, and the sole of the foot. Sciatica is a symptom of another medical condition that pinches or puts pressure on the sciatic nerve. Sciatica most often only affects one side of the body. Sciatica usually goes away on its own or with treatment. In some cases, sciatica may come back (recur). What are the causes? This condition is caused by pressure on the sciatic nerve or pinching of the nerve. This may be the result of:  A disk in between the bones of the spine bulging out too far (herniated disk).  Age-related changes in the spinal disks.  A pain disorder that affects a muscle in the buttock.  Extra bone growth near the sciatic nerve.  A break (fracture) of the pelvis.  Pregnancy.  Tumor. This is rare. What increases the risk? The following factors may make you more likely to develop this condition:  Playing sports that place pressure or stress on the spine.  Having poor strength and flexibility.  A history of back injury or surgery.  Sitting for long periods of time.  Doing activities that involve repetitive bending or lifting.  Obesity. What are the signs or symptoms? Symptoms can vary from mild to very severe, and they may include:  Any of these problems in the lower back, leg, hip, or buttock: ? Mild tingling, numbness, or dull aches. ? Burning sensations. ? Sharp pains.  Numbness in the back of the calf or the sole of the foot.  Leg weakness.  Severe back pain that makes movement difficult. Symptoms may get worse when you cough, sneeze, or laugh, or when you sit or stand for long periods of time. How is this diagnosed? This condition may be  diagnosed based on:  Your symptoms and medical history.  A physical exam.  Blood tests.  Imaging tests, such as: ? X-rays. ? MRI. ? CT scan. How is this treated? In many cases, this condition improves on its own without treatment. However, treatment may include:  Reducing or modifying physical activity.  Exercising and stretching.  Icing and applying heat to the affected area.  Medicines that help to: ? Relieve pain and swelling. ? Relax your muscles.  Injections of medicines that help to relieve pain, irritation, and inflammation around the sciatic nerve (steroids).  Surgery. Follow these instructions at home: Medicines  Take over-the-counter and prescription medicines only as told by your health care provider.  Ask your health care provider if the medicine prescribed to you: ? Requires you to avoid driving or using heavy machinery. ? Can cause constipation. You may need to take these actions to prevent or treat constipation:  Drink enough fluid to keep your urine pale yellow.  Take over-the-counter or prescription medicines.  Eat foods that are high in fiber, such as beans, whole grains, and fresh fruits and vegetables.  Limit foods that are high in fat and processed sugars, such as fried or sweet foods. Managing pain      If directed, put ice on the affected area. ? Put ice in a plastic bag. ? Place a towel between your skin and the bag. ? Leave the ice on for 20 minutes,   2-3 times a day.  If directed, apply heat to the affected area. Use the heat source that your health care provider recommends, such as a moist heat pack or a heating pad. ? Place a towel between your skin and the heat source. ? Leave the heat on for 20-30 minutes. ? Remove the heat if your skin turns bright red. This is especially important if you are unable to feel pain, heat, or cold. You may have a greater risk of getting burned. Activity   Return to your normal activities as told  by your health care provider. Ask your health care provider what activities are safe for you.  Avoid activities that make your symptoms worse.  Take brief periods of rest throughout the day. ? When you rest for longer periods, mix in some mild activity or stretching between periods of rest. This will help to prevent stiffness and pain. ? Avoid sitting for long periods of time without moving. Get up and move around at least one time each hour.  Exercise and stretch regularly, as told by your health care provider.  Do not lift anything that is heavier than 10 lb (4.5 kg) while you have symptoms of sciatica. When you do not have symptoms, you should still avoid heavy lifting, especially repetitive heavy lifting.  When you lift objects, always use proper lifting technique, which includes: ? Bending your knees. ? Keeping the load close to your body. ? Avoiding twisting. General instructions  Maintain a healthy weight. Excess weight puts extra stress on your back.  Wear supportive, comfortable shoes. Avoid wearing high heels.  Avoid sleeping on a mattress that is too soft or too hard. A mattress that is firm enough to support your back when you sleep may help to reduce your pain.  Keep all follow-up visits as told by your health care provider. This is important. Contact a health care provider if:  You have pain that: ? Wakes you up when you are sleeping. ? Gets worse when you lie down. ? Is worse than you have experienced in the past. ? Lasts longer than 4 weeks.  You have an unexplained weight loss. Get help right away if:  You are not able to control when you urinate or have bowel movements (incontinence).  You have: ? Weakness in your lower back, pelvis, buttocks, or legs that gets worse. ? Redness or swelling of your back. ? A burning sensation when you urinate. Summary  Sciatica is pain, numbness, weakness, or tingling along the path of the sciatic nerve.  This condition  is caused by pressure on the sciatic nerve or pinching of the nerve.  Sciatica can cause pain, numbness, or tingling in the lower back, legs, hips, and buttocks.  Treatment often includes rest, exercise, medicines, and applying ice or heat. This information is not intended to replace advice given to you by your health care provider. Make sure you discuss any questions you have with your health care provider. Document Revised: 03/17/2018 Document Reviewed: 03/17/2018 Elsevier Patient Education  2020 [employer] Sciatica Rehab Ask your health care provider which exercises are safe for you. Do exercises exactly as told by your health care provider and adjust them as directed. It is normal to feel mild stretching, pulling, tightness, or discomfort as you do these exercises. Stop right away if you feel sudden pain or your pain gets worse. Do not begin these exercises until told by your health care provider. Stretching and range-of-motion exercises These exercises warm up   your muscles and joints and improve the movement and flexibility of your hips and back. These exercises also help to relieve pain, numbness, and tingling. Sciatic nerve glide 1. Sit in a chair with your head facing down toward your chest. Place your hands behind your back. Let your shoulders slump forward. 2. Slowly straighten one of your legs while you tilt your head back as if you are looking toward the ceiling. Only straighten your leg as far as you can without making your symptoms worse. 3. Hold this position for __________ seconds. 4. Slowly return to the starting position. 5. Repeat with your other leg. Repeat __________ times. Complete this exercise __________ times a day. Knee to chest with hip adduction and internal rotation  1. Lie on your back on a firm surface with both legs straight. 2. Bend one of your knees and move it up toward your chest until you feel a gentle stretch in your lower back and buttock. Then, move  your knee toward the shoulder that is on the opposite side from your leg. This is hip adduction and internal rotation. ? Hold your leg in this position by holding on to the front of your knee. 3. Hold this position for __________ seconds. 4. Slowly return to the starting position. 5. Repeat with your other leg. Repeat __________ times. Complete this exercise __________ times a day. Prone extension on elbows  1. Lie on your abdomen on a firm surface. A bed may be too soft for this exercise. 2. Prop yourself up on your elbows. 3. Use your arms to help lift your chest up until you feel a gentle stretch in your abdomen and your lower back. ? This will place some of your body weight on your elbows. If this is uncomfortable, try stacking pillows under your chest. ? Your hips should stay down, against the surface that you are lying on. Keep your hip and back muscles relaxed. 4. Hold this position for __________ seconds. 5. Slowly relax your upper body and return to the starting position. Repeat __________ times. Complete this exercise __________ times a day. Strengthening exercises These exercises build strength and endurance in your back. Endurance is the ability to use your muscles for a long time, even after they get tired. Pelvic tilt This exercise strengthens the muscles that lie deep in the abdomen. 1. Lie on your back on a firm surface. Bend your knees and keep your feet flat on the floor. 2. Tense your abdominal muscles. Tip your pelvis up toward the ceiling and flatten your lower back into the floor. ? To help with this exercise, you may place a small towel under your lower back and try to push your back into the towel. 3. Hold this position for __________ seconds. 4. Let your muscles relax completely before you repeat this exercise. Repeat __________ times. Complete this exercise __________ times a day. Alternating arm and leg raises  1. Get on your hands and knees on a firm surface.  If you are on a hard floor, you may want to use padding, such as an exercise mat, to cushion your knees. 2. Line up your arms and legs. Your hands should be directly below your shoulders, and your knees should be directly below your hips. 3. Lift your left leg behind you. At the same time, raise your right arm and straighten it in front of you. ? Do not lift your leg higher than your hip. ? Do not lift your arm higher than your shoulder. ?   Keep your abdominal and back muscles tight. ? Keep your hips facing the ground. ? Do not arch your back. ? Keep your balance carefully, and do not hold your breath. 4. Hold this position for __________ seconds. 5. Slowly return to the starting position. 6. Repeat with your right leg and your left arm. Repeat __________ times. Complete this exercise __________ times a day. Posture and body mechanics Good posture and healthy body mechanics can help to relieve stress in your body's tissues and joints. Body mechanics refers to the movements and positions of your body while you do your daily activities. Posture is part of body mechanics. Good posture means:  Your spine is in its natural S-curve position (neutral).  Your shoulders are pulled back slightly.  Your head is not tipped forward. Follow these guidelines to improve your posture and body mechanics in your everyday activities. Standing   When standing, keep your spine neutral and your feet about hip width apart. Keep a slight bend in your knees. Your ears, shoulders, and hips should line up.  When you do a task in which you stand in one place for a long time, place one foot up on a stable object that is 2-4 inches (5-10 cm) high, such as a footstool. This helps keep your spine neutral. Sitting   When sitting, keep your spine neutral and keep your feet flat on the floor. Use a footrest, if necessary, and keep your thighs parallel to the floor. Avoid rounding your shoulders, and avoid tilting your  head forward.  When working at a desk or a computer, keep your desk at a height where your hands are slightly lower than your elbows. Slide your chair under your desk so you are close enough to maintain good posture.  When working at a computer, place your monitor at a height where you are looking straight ahead and you do not have to tilt your head forward or downward to look at the screen. Resting  When lying down and resting, avoid positions that are most painful for you.  If you have pain with activities such as sitting, bending, stooping, or squatting, lie in a position in which your body does not bend very much. For example, avoid curling up on your side with your arms and knees near your chest (fetal position).  If you have pain with activities such as standing for a long time or reaching with your arms, lie with your spine in a neutral position and bend your knees slightly. Try the following positions: ? Lying on your side with a pillow between your knees. ? Lying on your back with a pillow under your knees. Lifting   When lifting objects, keep your feet at least shoulder width apart and tighten your abdominal muscles.  Bend your knees and hips and keep your spine neutral. It is important to lift using the strength of your legs, not your back. Do not lock your knees straight out.  Always ask for help to lift heavy or awkward objects. This information is not intended to replace advice given to you by your health care provider. Make sure you discuss any questions you have with your health care provider. Document Revised: 06/20/2018 Document Reviewed: 03/20/2018 Elsevier Patient Education  2020 [employer]  
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
## 2020-02-02 MED FILL — METFORMIN HCL 1000 MG TABS: 1000 | 90 days supply | Qty: 180 | Fill #1

## 2020-02-02 MED FILL — JANUVIA 100 MG TABLET: 100 | 30 days supply | Qty: 30 | Fill #5

## 2020-02-10 MED FILL — VERAPAMIL ER 240 MG CAPSULE: 240 | 90 days supply | Qty: 90 | Fill #1

## 2020-02-12 ENCOUNTER — Ambulatory Visit: Payer: 59 | Admitting: Family Medicine

## 2020-02-22 ENCOUNTER — Encounter: Payer: Self-pay | Admitting: Family Medicine

## 2020-02-22 ENCOUNTER — Other Ambulatory Visit: Payer: Self-pay | Admitting: Family Medicine

## 2020-02-22 ENCOUNTER — Ambulatory Visit (INDEPENDENT_AMBULATORY_CARE_PROVIDER_SITE_OTHER): Payer: 59 | Admitting: Family Medicine

## 2020-02-22 ENCOUNTER — Other Ambulatory Visit: Payer: Self-pay

## 2020-02-22 VITALS — BP 128/60 | HR 78 | Temp 98.3°F

## 2020-02-22 DIAGNOSIS — M5431 Sciatica, right side: Secondary | ICD-10-CM

## 2020-02-22 DIAGNOSIS — M5432 Sciatica, left side: Secondary | ICD-10-CM | POA: Diagnosis not present

## 2020-02-22 MED ORDER — CYCLOBENZAPRINE HCL 5 MG PO TABS: 5.0000 mg | ORAL_TABLET | Freq: Every evening | ORAL | 0 refills | Status: DC | PRN

## 2020-02-22 MED FILL — CYCLOBENZAPRINE HCL 5 MG TA: 5 | 30 days supply | Qty: 30 | Fill #0
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
## 2020-02-23 MED FILL — SPIRONOLACTONE 50 MG TABLET: 50 | 90 days supply | Qty: 90 | Fill #2

## 2020-03-15 MED FILL — QUINAPRIL HCL 40 MG TABS: 40 | 90 days supply | Qty: 90 | Fill #2

## 2020-03-15 MED FILL — JANUVIA 100 MG TABLET: 100 | 30 days supply | Qty: 30 | Fill #6

## 2020-04-11 ENCOUNTER — Encounter: Payer: Self-pay | Admitting: Family Medicine

## 2020-04-11 ENCOUNTER — Ambulatory Visit (INDEPENDENT_AMBULATORY_CARE_PROVIDER_SITE_OTHER): Payer: 59 | Admitting: Family Medicine

## 2020-04-11 VITALS — BP 140/80 | HR 74 | Temp 98.4°F | Wt 198.0 lb

## 2020-04-11 DIAGNOSIS — M5432 Sciatica, left side: Secondary | ICD-10-CM

## 2020-04-11 DIAGNOSIS — M5431 Sciatica, right side: Secondary | ICD-10-CM | POA: Diagnosis not present
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
## 2020-04-29 MED FILL — JANUVIA 100 MG TABLET: 100 | 30 days supply | Qty: 30 | Fill #7

## 2020-05-05 ENCOUNTER — Other Ambulatory Visit: Payer: Self-pay | Admitting: Internal Medicine

## 2020-05-05 ENCOUNTER — Ambulatory Visit: Payer: 59 | Admitting: Internal Medicine

## 2020-05-05 ENCOUNTER — Encounter: Payer: Self-pay | Admitting: Internal Medicine

## 2020-05-05 ENCOUNTER — Other Ambulatory Visit: Payer: Self-pay

## 2020-05-05 VITALS — BP 120/78 | HR 73 | Ht 64.0 in | Wt 202.0 lb

## 2020-05-05 DIAGNOSIS — Z6835 Body mass index (BMI) 35.0-35.9, adult: Secondary | ICD-10-CM | POA: Diagnosis not present

## 2020-05-05 DIAGNOSIS — E1169 Type 2 diabetes mellitus with other specified complication: Secondary | ICD-10-CM | POA: Diagnosis not present

## 2020-05-05 DIAGNOSIS — E785 Hyperlipidemia, unspecified: Secondary | ICD-10-CM

## 2020-05-05 DIAGNOSIS — E113592 Type 2 diabetes mellitus with proliferative diabetic retinopathy without macular edema, left eye: Secondary | ICD-10-CM | POA: Diagnosis not present

## 2020-05-05 LAB — POCT GLYCOSYLATED HEMOGLOBIN (HGB A1C): Hemoglobin A1C: 6.2 % — AB (ref 4.0–5.6)

## 2020-05-05 MED ORDER — SITAGLIPTIN PHOSPHATE 100 MG PO TABS
ORAL_TABLET | ORAL | 3 refills | Status: DC
Start: 2020-05-05 — End: 2020-05-05

## 2020-05-05 MED ORDER — METFORMIN HCL 1000 MG PO TABS
ORAL_TABLET | ORAL | 3 refills | Status: DC
Start: 2020-05-05 — End: 2020-05-05

## 2020-05-05 MED FILL — METFORMIN HCL 1000 MG TABS: 1000 | 90 days supply | Qty: 180 | Fill #0

## 2020-05-05 NOTE — Patient Instructions (Signed)
Please continue: - Metformin 1000 mg 2x a day - Januvia 100 mg daily in am  Please come back for a follow-up appointment in 6 months.

## 2020-05-05 NOTE — Progress Notes (Signed)
Patient ID: Katelyn Peterson, female   DOB: April 06, 1945, 75 y.o.   MRN: [id number]  This visit occurred during the SARS-CoV-2 public health emergency.  Safety protocols were in place, including screening questions prior to the visit, additional usage of staff PPE, and extensive cleaning of exam room while observing appropriate contact time as indicated for disinfecting solutions.   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 Last visit 6 months ago.  Reviewed HbA1c levels: Lab Results  Component Value Date   HGBA1C 6.1 (A) 11/03/2019   HGBA1C 6.2 (A) 05/05/2019   HGBA1C 6.1 (A) 10/31/2018   Pt is on a regimen of: - Metformin 1000 mg twice a day - Januvia 100 mg daily in am We stopped Amaryl 2 mg bid and Actos 30 mg daily for daily hypoglycemia events and possible SEs, respectively.  Pt checks her sugars once a day: - am: 80-114 >> 88, 92-110 >> 71, 78, 80-108, 120 >> 62-114, 125, 140 - 2h after b'fast: n/c >> 131 >> n/c >> 114 >> n/c - before lunch: n/c >> 93 >> 100-110 >> n/c - 2h after lunch: 1n/c>> 110, 121 >> n/c - before dinner: 105, 115 >> n/c >> 102 >> n/c - 2h after dinner: 135, 135 >> n/c - bedtime:   90 >> 70 >> n/c Lowest sugar was 80 >> 88 >> 71 >> 62;  she has hypoglycemia awareness in the 60s. Highest sugar was 114 >> 110 >> 120 >> 140  -No CKD, last BUN/creatinine:  Lab Results  Component Value Date   BUN 18 06/09/2019   CREATININE 0.98 06/09/2019  On quinapril 40.  Normal ACR: Lab Results  Component Value Date   MICRALBCREAT 2.3 06/09/2019   MICRALBCREAT 0.9 06/10/2018   MICRALBCREAT 1.5 01/07/2017   MICRALBCREAT 0.9 05/08/2016   -+ HL; last set of lipids: Lab Results  Component Value Date   CHOL 152 06/09/2019   HDL 53.60 06/09/2019   LDLCALC 88 06/09/2019   LDLDIRECT 146.5 09/18/2012   TRIG 54.0 06/09/2019   CHOLHDL 3 06/09/2019   She had leg cramps with statins: Rosuvastatin, pravastatin, atorvastatin and fluvastatin XL.  She refused to try Zetia.  However, she now does well on Lipitor low-dose every other day (or sometimes 2x a week) and she uses a muscle rub cream for cramps.  - last eye exam was in 05/2019: + OS PDR, no macular edema; Dr. Edyth Gunnels.  -She denies numbness and tingling in her feet.  She has a history of HTN. She has been considered for lap band surgery >> she did not want to follow through with this. She also has bilateral chronic lower extremity cramps, right knee pain, anemia.  Latest TSH was normal: Lab Results  Component Value Date   TSH 1.67 06/09/2019   ROS: Constitutional: no weight gain/no weight loss, no fatigue, no subjective hyperthermia, no subjective hypothermia Eyes: no blurry vision, no xerophthalmia ENT: no sore throat, no nodules palpated in neck, no dysphagia, no odynophagia, no hoarseness Cardiovascular: no CP/no SOB/no palpitations/no leg swelling Respiratory: no cough/no SOB/no wheezing Gastrointestinal: no N/no V/no D/no C/no acid reflux Musculoskeletal: +muscle aches (statins)/no joint aches Skin: no rashes, no hair loss Neurological: no tremors/no numbness/no tingling/no dizziness  I reviewed pt's medications, allergies, PMH, social hx, family hx, and changes were documented in the history of present illness. Otherwise, unchanged from my initial visit note.  Past  Medical History:  Diagnosis Date  . Arthritis   . HTN (hypertension)   . Hyperlipidemia   . Obesity   . Type II or unspecified type diabetes mellitus without mention of complication, not stated as uncontrolled    Past Surgical History:  Procedure Laterality Date  . ABDOMINAL HYSTERECTOMY     Social History   Socioeconomic History  . Marital status: Married    Spouse name: Not on file  . Number of children: Not on file  . Years of education: Not on file  . Highest education level: Not on file   Occupational History  . Occupation: Public house manager  Tobacco Use  . Smoking status: Never Smoker  . Smokeless tobacco: Never Used  Substance and Sexual Activity  . Alcohol use: No  . Drug use: No  . Sexual activity: Yes    Partners: Male  Other Topics Concern  . Not on file  Social History Narrative   Regular exercise: seldom   Caffeine use: occasionally         Social Determinants of Health   Financial Resource Strain: Not on file  Food Insecurity: Not on file  Transportation Needs: Not on file  Physical Activity: Not on file  Stress: Not on file  Social Connections: Not on file  Intimate Partner Violence: Not on file   Current Outpatient Medications on File Prior to Visit  Medication Sig Dispense Refill  . atorvastatin (LIPITOR) 20 MG tablet Take 1 tablet (20 mg total) by mouth daily. 90 tablet 3  . cetirizine (ZYRTEC) 10 MG tablet TAKE ONE TABLET BY MOUTH ONCE DAILY 70 tablet 5  . Cholecalciferol 1000 UNITS tablet Take 1,000 Units by mouth daily.    . cyclobenzaprine (FLEXERIL) 5 MG tablet Take 1 tablet (5 mg total) by mouth at bedtime as needed for muscle spasms. 30 tablet 0  . Diclofenac Sodium 2 % SOLN Apply 1 pump twice daily. 112 g 3  . ibuprofen (ADVIL,MOTRIN) 600 MG tablet TAKE 1 TABLET BY MOUTH TWICE DAILY AFTER MEALS FOR 1 WEEK, THEN AS NEEDED FOR PAIN 60 tablet 2  . loratadine (CLARITIN) 10 MG tablet Take 1 tablet (10 mg total) by mouth daily. 90 tablet 0  . Menthol, Topical Analgesic, (BIOFREEZE) 4 % GEL Apply to affected area daily as needed 150 mL 0  . metFORMIN (GLUCOPHAGE) 1000 MG tablet TAKE 1 TABLET BY MOUTH TWICE DAILY WITH A MEAL 180 tablet 3  . OVER THE COUNTER MEDICATION Goody Powder for headaches. PRN    . quinapril (ACCUPRIL) 40 MG tablet Take 1 tablet (40 mg total) by mouth daily. 90 tablet 3  . sitaGLIPtin (JANUVIA) 100 MG tablet TAKE 1 TABLET BY MOUTH ONCE DAILY IN THE MORNING 90 tablet 3  . spironolactone (ALDACTONE) 50 MG tablet Take 1  tablet (50 mg total) by mouth daily. 90 tablet 3  . traMADol (ULTRAM) 50 MG tablet Take 1 tablet (50 mg total) by mouth 4 (four) times daily. 100 tablet 2  . verapamil (VERELAN PM) 240 MG 24 hr capsule Take 1 capsule (240 mg total) by mouth at bedtime. 90 capsule 3   Current Facility-Administered Medications on File Prior to Visit  Medication Dose Route Frequency Provider Last Rate Last Admin  . 0.9 %  sodium chloride infusion  500 mL Intravenous Continuous Milus Banister, MD       Allergies  Allergen Reactions  . Atorvastatin     REACTION: cramps  . Hydrochlorothiazide     REACTION: Cramps  .  Pravastatin     cramps   Family History  Problem Relation Age of Onset  . Hypertension Mother   . Diabetes Mother   . Hypertension Father   . Hypertension Other   . Colon cancer Neg Hx   . Esophageal cancer Neg Hx   . Rectal cancer Neg Hx   . Stomach cancer Neg Hx    PE: BP 120/78   Pulse 73   Ht 5\' 4"  (1.626 m)   Wt 202 lb (91.6 kg)   SpO2 94%   BMI 34.67 kg/m  Body mass index is 34.67 kg/m.  Wt Readings from Last 3 Encounters:  05/05/20 202 lb (91.6 kg)  04/11/20 198 lb (89.8 kg)  12/10/19 198 lb (89.8 kg)   Constitutional: overweight, in NAD Eyes: PERRLA, EOMI, no exophthalmos ENT: moist mucous membranes, no thyromegaly, no cervical lymphadenopathy Cardiovascular: RRR, No MRG Respiratory: CTA B Gastrointestinal: abdomen soft, NT, ND, BS+ Musculoskeletal: no deformities, strength intact in all 4 Skin: moist, warm, no rashes Neurological: no tremor with outstretched hands, DTR normal in all 4  ASSESSMENT: 1. 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 Hyperlipidemia - Developed leg cramps with pravastatin, atorvastatin, Crestor, fluvastatin  3.  Obesity class II  4. Goiter by palpation 11/04/2012: Thyroid U/S:  Right thyroid lobe: 4.3 x 1.0 x 1.8 cm   Left thyroid lobe: 4.6 x 1.2 x 1.9 cm   Isthmus: 5 mm   Focal nodules: Thyroid  echotexture is homogeneous, without solid or cystic lesion.   Lymphadenopathy: None visualized.  IMPRESSION:  Normal thyroid ultrasound. No evidence of thyromegaly or dominant solid/cystic mass.  -No further intervention needed for this  PLAN:  1. DM2 -Patient with longstanding, well-controlled, type 2 diabetes, on oral antidiabetic regimen with Metformin and DPP 4 inhibitor, with good control.  She is only checking sugars in the morning despite repeated advice to check some sugars later in the day, also.  These appear well controlled and HbA1c levels are usually only slightly higher than 6%. -At today's visit, sugars remain controlled, with only 2 values in the 60s, but without associated symptoms.  She also had 2 values at 140 in the last 3 months. No need to change her regimen but I again advised her to try to check some blood sugars later in the day. - I suggested to:  Patient Instructions  Please continue: - Metformin 1000 mg 2x a day - Januvia 100 mg daily in am  Please come back for a follow-up appointment in 6 months.  - we checked her HbA1c: 6.2% (stable) - advised to check sugars at different times of the day - 1x a day, rotating check times - advised for yearly eye exams >> she is UTD - return to clinic in 6 months  2. Hyperlipidemia -Reviewed latest lipid panel from 05/2019: LDL was slightly above goal, the rest of the fractions at goal: Lab Results  Component Value Date   CHOL 152 06/09/2019   HDL 53.60 06/09/2019   LDLCALC 88 06/09/2019   LDLDIRECT 146.5 09/18/2012   TRIG 54.0 06/09/2019   CHOLHDL 3 06/09/2019  -She had leg cramps from Crestor low-dose, pravastatin, atorvastatin, fluvastatin XL.  She was willing to restart Lipitor and we initially started on 20 mg every 2 to 3 days and I advised her to move it closer together if she tolerates it well.  We discussed in the past about possibly using co-Q10 and magnesium to help with muscle aches  -She is  now on Lipitor  20 mg every other day  3.  Obesity class II -We will continue Metformin and Januvia which are both weight neutral -She lost 3 pounds before last visit, 5 pounds before the previous visit -Since last visit, she gained 4 pounds.  Philemon Kingdom, MD PhD [facility]

## 2020-06-08 ENCOUNTER — Ambulatory Visit: Payer: 59 | Admitting: Internal Medicine

## 2020-06-08 ENCOUNTER — Encounter: Payer: Self-pay | Admitting: Internal Medicine

## 2020-06-08 ENCOUNTER — Other Ambulatory Visit: Payer: Self-pay

## 2020-06-08 VITALS — BP 143/88 | HR 60 | Temp 98.0°F | Ht 64.0 in | Wt 200.0 lb

## 2020-06-08 DIAGNOSIS — Z23 Encounter for immunization: Secondary | ICD-10-CM | POA: Diagnosis not present

## 2020-06-08 DIAGNOSIS — Z0000 Encounter for general adult medical examination without abnormal findings: Secondary | ICD-10-CM

## 2020-06-08 DIAGNOSIS — E113592 Type 2 diabetes mellitus with proliferative diabetic retinopathy without macular edema, left eye: Secondary | ICD-10-CM

## 2020-06-08 DIAGNOSIS — E785 Hyperlipidemia, unspecified: Secondary | ICD-10-CM

## 2020-06-08 DIAGNOSIS — E1169 Type 2 diabetes mellitus with other specified complication: Secondary | ICD-10-CM | POA: Diagnosis not present

## 2020-06-08 DIAGNOSIS — Z6835 Body mass index (BMI) 35.0-35.9, adult: Secondary | ICD-10-CM | POA: Diagnosis not present

## 2020-06-08 LAB — URINALYSIS, ROUTINE W REFLEX MICROSCOPIC
Bilirubin Urine: NEGATIVE
Hgb urine dipstick: NEGATIVE
Ketones, ur: NEGATIVE
Leukocytes,Ua: NEGATIVE
Nitrite: POSITIVE — AB
RBC / HPF: NONE SEEN (ref 0–?)
Specific Gravity, Urine: 1.025 (ref 1.000–1.030)
Total Protein, Urine: NEGATIVE
Urine Glucose: NEGATIVE
Urobilinogen, UA: 0.2 (ref 0.0–1.0)
pH: 6 (ref 5.0–8.0)

## 2020-06-08 LAB — TSH: TSH: 1.89 u[IU]/mL (ref 0.35–4.50)

## 2020-06-08 LAB — COMPREHENSIVE METABOLIC PANEL
ALT: 15 U/L (ref 0–35)
AST: 11 U/L (ref 0–37)
Albumin: 4.1 g/dL (ref 3.5–5.2)
Alkaline Phosphatase: 89 U/L (ref 39–117)
BUN: 19 mg/dL (ref 6–23)
CO2: 25 mEq/L (ref 19–32)
Calcium: 9.4 mg/dL (ref 8.4–10.5)
Chloride: 108 mEq/L (ref 96–112)
Creatinine, Ser: 1 mg/dL (ref 0.40–1.20)
GFR: 55.6 mL/min — ABNORMAL LOW (ref >60.00)
Glucose, Bld: 112 mg/dL — ABNORMAL HIGH (ref 70–99)
Potassium: 4.5 mEq/L (ref 3.5–5.1)
Sodium: 140 mEq/L (ref 135–145)
Total Bilirubin: 0.3 mg/dL (ref 0.2–1.2)
Total Protein: 7.5 g/dL (ref 6.0–8.3)

## 2020-06-08 LAB — LIPID PANEL
Cholesterol: 157 mg/dL (ref 0–200)
HDL: 60.1 mg/dL (ref >39.00)
LDL Cholesterol: 86 mg/dL (ref 0–99)
NonHDL: 96.46
Total CHOL/HDL Ratio: 3
Triglycerides: 51 mg/dL (ref 0.0–149.0)
VLDL: 10.2 mg/dL (ref 0.0–40.0)

## 2020-06-08 LAB — MICROALBUMIN / CREATININE URINE RATIO
Creatinine,U: 129.2 mg/dL
Microalb Creat Ratio: 1.4 mg/g (ref 0.0–30.0)
Microalb, Ur: 1.8 mg/dL (ref 0.0–1.9)

## 2020-06-08 NOTE — Assessment & Plan Note (Signed)
On Lipitor cardiac CT scan for calcium scoring option given

## 2020-06-08 NOTE — Assessment & Plan Note (Addendum)
Cont w/Metformin, Januvia,  Lipitor

## 2020-06-08 NOTE — Addendum Note (Signed)
Addended by: Cresenciano Lick on: 06/08/2020 09:40 AM   Modules accepted: Orders

## 2020-06-08 NOTE — Addendum Note (Signed)
Addended by: Jacobo Forest on: 06/08/2020 09:42 AM   Modules accepted: Orders

## 2020-06-08 NOTE — Assessment & Plan Note (Signed)
Wt Readings from Last 3 Encounters:  06/08/20 200 lb (90.7 kg)  05/05/20 202 lb (91.6 kg)  04/11/20 198 lb (89.8 kg)  On diet

## 2020-06-08 NOTE — Patient Instructions (Signed)

## 2020-06-08 NOTE — Addendum Note (Signed)
Addended by: Earnstine Regal on: 06/08/2020 09:33 AM   Modules accepted: Orders

## 2020-06-08 NOTE — Progress Notes (Signed)
Subjective:  Patient ID: Katelyn Peterson, female    DOB: [birth year]  Age: 75 y.o. MRN: [id number]  CC: Follow-up (6 month follow up)   HPI Katelyn Peterson presents for OA, dyslipidemia, HTN, DM  Outpatient Medications Prior to Visit  Medication Sig Dispense Refill  . atorvastatin (LIPITOR) 20 MG tablet Take 1 tablet (20 mg total) by mouth daily. 90 tablet 3  . cetirizine (ZYRTEC) 10 MG tablet TAKE ONE TABLET BY MOUTH ONCE DAILY 70 tablet 5  . Cholecalciferol 1000 UNITS tablet Take 1,000 Units by mouth daily.    . cyclobenzaprine (FLEXERIL) 5 MG tablet Take 1 tablet (5 mg total) by mouth at bedtime as needed for muscle spasms. 30 tablet 0  . Diclofenac Sodium 2 % SOLN Apply 1 pump twice daily. 112 g 3  . ibuprofen (ADVIL,MOTRIN) 600 MG tablet TAKE 1 TABLET BY MOUTH TWICE DAILY AFTER MEALS FOR 1 WEEK, THEN AS NEEDED FOR PAIN 60 tablet 2  . loratadine (CLARITIN) 10 MG tablet Take 1 tablet (10 mg total) by mouth daily. 90 tablet 0  . Menthol, Topical Analgesic, (BIOFREEZE) 4 % GEL Apply to affected area daily as needed 150 mL 0  . metFORMIN (GLUCOPHAGE) 1000 MG tablet TAKE 1 TABLET BY MOUTH TWICE DAILY WITH A MEAL 180 tablet 3  . OVER THE COUNTER MEDICATION Goody Powder for headaches. PRN    . quinapril (ACCUPRIL) 40 MG tablet Take 1 tablet (40 mg total) by mouth daily. 90 tablet 3  . sitaGLIPtin (JANUVIA) 100 MG tablet TAKE 1 TABLET BY MOUTH ONCE DAILY IN THE MORNING 90 tablet 3  . spironolactone (ALDACTONE) 50 MG tablet Take 1 tablet (50 mg total) by mouth daily. 90 tablet 3  . traMADol (ULTRAM) 50 MG tablet Take 1 tablet (50 mg total) by mouth 4 (four) times daily. 100 tablet 2  . verapamil (VERELAN PM) 240 MG 24 hr capsule Take 1 capsule (240 mg total) by mouth at bedtime. 90 capsule 3   Facility-Administered Medications Prior to Visit  Medication Dose Route Frequency Provider Last Rate Last Admin  . 0.9 %  sodium chloride infusion  500 mL Intravenous Continuous Milus Banister,  MD        ROS: Review of Systems  Constitutional: Negative for activity change, appetite change, chills, fatigue and unexpected weight change.  HENT: Negative for congestion, mouth sores and sinus pressure.   Eyes: Negative for visual disturbance.  Respiratory: Negative for cough and chest tightness.   Gastrointestinal: Negative for abdominal pain and nausea.  Genitourinary: Negative for difficulty urinating, frequency and vaginal pain.  Musculoskeletal: Positive for arthralgias. Negative for back pain and gait problem.  Skin: Negative for pallor and rash.  Neurological: Negative for dizziness, tremors, weakness, numbness and headaches.  Psychiatric/Behavioral: Negative for confusion and sleep disturbance.    Objective:  BP (!) 143/88 (BP Location: Right Arm, Patient Position: Sitting, Cuff Size: Normal)   Pulse 60   Temp 98 F (36.7 C) (Oral)   Ht 5\' 4"  (1.626 m)   Wt 200 lb (90.7 kg)   SpO2 96%   BMI 34.33 kg/m   BP Readings from Last 3 Encounters:  06/08/20 (!) 143/88  05/05/20 120/78  04/11/20 140/80    Wt Readings from Last 3 Encounters:  06/08/20 200 lb (90.7 kg)  05/05/20 202 lb (91.6 kg)  04/11/20 198 lb (89.8 kg)    Physical Exam Constitutional:      General: She is not in acute distress.  Appearance: She is well-developed. She is obese.  HENT:     Head: Normocephalic.     Right Ear: External ear normal.     Left Ear: External ear normal.     Nose: Nose normal.  Eyes:     General:        Right eye: No discharge.        Left eye: No discharge.     Conjunctiva/sclera: Conjunctivae normal.     Pupils: Pupils are equal, round, and reactive to light.  Neck:     Thyroid: No thyromegaly.     Vascular: No JVD.     Trachea: No tracheal deviation.  Cardiovascular:     Rate and Rhythm: Normal rate and regular rhythm.     Heart sounds: Normal heart sounds.  Pulmonary:     Effort: No respiratory distress.     Breath sounds: No stridor. No wheezing.   Abdominal:     General: Bowel sounds are normal. There is no distension.     Palpations: Abdomen is soft. There is no mass.     Tenderness: There is no abdominal tenderness. There is no guarding or rebound.  Musculoskeletal:        General: No tenderness.     Cervical back: Normal range of motion and neck supple.  Lymphadenopathy:     Cervical: No cervical adenopathy.  Skin:    Findings: No erythema or rash.  Neurological:     Cranial Nerves: No cranial nerve deficit.     Motor: No abnormal muscle tone.     Coordination: Coordination normal.     Gait: Gait abnormal.     Deep Tendon Reflexes: Reflexes normal.  Psychiatric:        Behavior: Behavior normal.        Thought Content: Thought content normal.        Judgment: Judgment normal.     Lab Results  Component Value Date   WBC 5.8 06/09/2019   HGB 10.1 (L) 06/09/2019   HCT 30.8 (L) 06/09/2019   PLT 233.0 06/09/2019   GLUCOSE 108 (H) 06/09/2019   CHOL 152 06/09/2019   TRIG 54.0 06/09/2019   HDL 53.60 06/09/2019   LDLDIRECT 146.5 09/18/2012   LDLCALC 88 06/09/2019   ALT 11 06/09/2019   AST 10 06/09/2019   NA 139 06/09/2019   K 4.6 06/09/2019   CL 107 06/09/2019   CREATININE 0.98 06/09/2019   BUN 18 06/09/2019   CO2 26 06/09/2019   TSH 1.67 06/09/2019   HGBA1C 6.2 (A) 05/05/2020   MICROALBUR 1.0 06/09/2019    DG Knee 1-2 Views Right  Result Date: 06/20/2017 CLINICAL DATA:  Right knee pain and swelling for several days, no known injury, initial encounter EXAM: RIGHT KNEE - 2 VIEW COMPARISON:  None. FINDINGS: Tricompartmental degenerative changes are noted worst in the medial joint space. Multiple well calcified densities are identified in the suprapatellar bursa consistent with loose bodies. Some additional loose bodies are also noted in the posterior aspect of the joint. 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 No acute abnormality is noted. Electronically Signed   By: Inez Catalina M.D.   On: 06/20/2017 08:33    Assessment & Plan:   There are no diagnoses linked to this encounter.   No orders of the defined types were placed in this encounter.    Follow-up: No follow-ups on file.  Walker Kehr, MD

## 2020-06-13 ENCOUNTER — Other Ambulatory Visit (HOSPITAL_COMMUNITY): Payer: Self-pay

## 2020-06-27 ENCOUNTER — Other Ambulatory Visit: Payer: Self-pay | Admitting: Internal Medicine

## 2020-06-27 ENCOUNTER — Other Ambulatory Visit (HOSPITAL_COMMUNITY): Payer: Self-pay

## 2020-06-27 MED ORDER — ATORVASTATIN CALCIUM 20 MG PO TABS
ORAL_TABLET | Freq: Every day | ORAL | 3 refills | Status: DC
  Filled 2020-06-27 – 2020-07-04 (×2): qty 90, 90d supply, fill #0

## 2020-07-04 ENCOUNTER — Other Ambulatory Visit (HOSPITAL_COMMUNITY): Payer: Self-pay

## 2020-07-12 ENCOUNTER — Other Ambulatory Visit (HOSPITAL_COMMUNITY): Payer: Self-pay

## 2020-07-12 ENCOUNTER — Other Ambulatory Visit: Payer: Self-pay | Admitting: Internal Medicine

## 2020-07-12 MED ORDER — QUINAPRIL HCL 40 MG PO TABS
ORAL_TABLET | Freq: Every day | ORAL | 3 refills | Status: DC
  Filled 2020-07-12: qty 90, 90d supply, fill #0
  Filled 2020-10-24: qty 90, 90d supply, fill #1
  Filled 2021-02-06: qty 90, 90d supply, fill #2

## 2020-07-12 NOTE — Telephone Encounter (Signed)
1.Medication Requested: verapamil (VERELAN PM) 240 MG 24 hr capsule    2. Pharmacy (Name, Street, [place]): [facility]  3. On Med List: yes   4. Last Visit with PCP: 06-08-20  5. Next visit date with PCP: 12-14-20   Agent: Please be advised that RX refills may take up to 3 business days. We ask that you follow-up with your pharmacy.

## 2020-07-20 ENCOUNTER — Other Ambulatory Visit: Payer: Self-pay | Admitting: Internal Medicine

## 2020-07-20 ENCOUNTER — Other Ambulatory Visit (HOSPITAL_COMMUNITY): Payer: Self-pay

## 2020-07-20 MED ORDER — VERAPAMIL HCL ER 240 MG PO CP24
240.0000 mg | ORAL_CAPSULE | Freq: Every day | ORAL | 3 refills | Status: DC
  Filled 2020-07-20: qty 90, 90d supply, fill #0

## 2020-07-25 ENCOUNTER — Other Ambulatory Visit (HOSPITAL_COMMUNITY): Payer: Self-pay

## 2020-08-09 ENCOUNTER — Other Ambulatory Visit: Payer: Self-pay

## 2020-08-09 ENCOUNTER — Ambulatory Visit: Payer: 59 | Admitting: Adult Health

## 2020-08-09 ENCOUNTER — Encounter: Payer: Self-pay | Admitting: Adult Health

## 2020-08-09 VITALS — BP 150/80 | HR 77 | Temp 98.1°F | Ht 64.0 in | Wt 199.0 lb

## 2020-08-09 DIAGNOSIS — M65332 Trigger finger, left middle finger: Secondary | ICD-10-CM

## 2020-08-09 MED ORDER — METHYLPREDNISOLONE ACETATE 40 MG/ML IJ SUSP
10.0000 mg | Freq: Once | INTRAMUSCULAR | Status: AC
  Administered 2020-08-09: 10 mg via INTRAMUSCULAR

## 2020-08-09 NOTE — Progress Notes (Signed)
Subjective:    Patient ID: Katelyn Peterson, female    DOB: Jul 12, 1945, 75 y.o.   MRN: [id number]  HPI 75 year old female who  has a past medical history of Arthritis, HTN (hypertension), Hyperlipidemia, Obesity, and Type II or unspecified type diabetes mellitus without mention of complication, not stated as uncontrolled.  She is being evaluated today for trigger finger of her left middle finger.  Has been present for quite some time.  Starting to have some discomfort when the finger locks.  Has had trigger finger injection in the right hand "many years ago" and responded well to this.  She would like to have that done today   Review of Systems See HPI   Past Medical History:  Diagnosis Date  . Arthritis   . HTN (hypertension)   . Hyperlipidemia   . Obesity   . Type II or unspecified type diabetes mellitus without mention of complication, not stated as uncontrolled     Social History   Socioeconomic History  . Marital status: Married    Spouse name: Not on file  . Number of children: Not on file  . Years of education: Not on file  . Highest education level: Not on file  Occupational History  . Occupation: Public house manager  Tobacco Use  . Smoking status: Never Smoker  . Smokeless tobacco: Never Used  Substance and Sexual Activity  . Alcohol use: No  . Drug use: No  . Sexual activity: Yes    Partners: Male  Other Topics Concern  . Not on file  Social History Narrative   Regular exercise: seldom   Caffeine use: occasionally         Social Determinants of Health   Financial Resource Strain: Not on file  Food Insecurity: Not on file  Transportation Needs: Not on file  Physical Activity: Not on file  Stress: Not on file  Social Connections: Not on file  Intimate Partner Violence: Not on file    Past Surgical History:  Procedure Laterality Date  . ABDOMINAL HYSTERECTOMY      Family History  Problem Relation Age of Onset  . Hypertension Mother   .  Diabetes Mother   . Hypertension Father   . Hypertension Other   . Colon cancer Neg Hx   . Esophageal cancer Neg Hx   . Rectal cancer Neg Hx   . Stomach cancer Neg Hx     Allergies  Allergen Reactions  . Atorvastatin     REACTION: cramps  . Hydrochlorothiazide     REACTION: Cramps  . Pravastatin     cramps    Current Outpatient Medications on File Prior to Visit  Medication Sig Dispense Refill  . atorvastatin (LIPITOR) 20 MG tablet TAKE 1 TABLET BY MOUTH ONCE DAILY 90 tablet 3  . cetirizine (ZYRTEC) 10 MG tablet TAKE ONE TABLET BY MOUTH ONCE DAILY 70 tablet 5  . Cholecalciferol 1000 UNITS tablet Take 1,000 Units by mouth daily.    . cyclobenzaprine (FLEXERIL) 5 MG tablet TAKE 1 TABLET BY MOUTH AT BEDTIME AS NEEDED FOR MUSCLE SPASMS. 30 tablet 0  . Diclofenac Sodium 2 % SOLN Apply 1 pump twice daily. 112 g 3  . ibuprofen (ADVIL,MOTRIN) 600 MG tablet TAKE 1 TABLET BY MOUTH TWICE DAILY AFTER MEALS FOR 1 WEEK, THEN AS NEEDED FOR PAIN 60 tablet 2  . loratadine (CLARITIN) 10 MG tablet Take 1 tablet (10 mg total) by mouth daily. 90 tablet 0  . Menthol, Topical Analgesic, (  BIOFREEZE) 4 % GEL Apply to affected area daily as needed 150 mL 0  . metFORMIN (GLUCOPHAGE) 1000 MG tablet TAKE 1 TABLET BY MOUTH 2 TIMES DAILY WITH A MEAL 180 tablet 3  . OVER THE COUNTER MEDICATION Goody Powder for headaches. PRN    . quinapril (ACCUPRIL) 40 MG tablet TAKE 1 TABLET BY MOUTH ONCE DAILY 90 tablet 3  . sitaGLIPtin (JANUVIA) 100 MG tablet TAKE 1 TABLET BY MOUTH ONCE DAILY IN THE MORNING 90 tablet 3  . traMADol (ULTRAM) 50 MG tablet Take 1 tablet (50 mg total) by mouth 4 (four) times daily. 100 tablet 2  . verapamil (VERELAN PM) 240 MG 24 hr capsule Take 1 capsule (240 mg total) by mouth at bedtime. 90 capsule 3  . spironolactone (ALDACTONE) 50 MG tablet TAKE 1 TABLET BY MOUTH ONCE DAILY 90 tablet 3   Current Facility-Administered Medications on File Prior to Visit  Medication Dose Route Frequency  Provider Last Rate Last Admin  . 0.9 %  sodium chloride infusion  500 mL Intravenous Continuous Milus Banister, MD        BP (!) 150/80   Pulse 77   Temp 98.1 F (36.7 C) (Oral)   Ht 5\' 4"  (1.626 m)   Wt 199 lb (90.3 kg)   SpO2 100%   BMI 34.16 kg/m       Objective:   Physical Exam Vitals reviewed.  Constitutional:      Appearance: Normal appearance.  Skin:    General: Skin is warm and dry.     Comments: Locking sensation with active flexion/extension activity to left middle finger.  Nodule felt to the distal palm with positive pain with palpation  Neurological:     General: No focal deficit present.     Mental Status: She is alert and oriented to person, place, and time.  Psychiatric:        Mood and Affect: Mood normal.        Behavior: Behavior normal.        Thought Content: Thought content normal.        Judgment: Judgment normal.       Assessment & Plan:  1. Trigger middle finger of left hand Verbal consent obtained.Palmer surface of left hand cleaned with alcohol.  Cold spray used for local anesthesia.  Using a 27-gauge needle, 10 mg of Depo-Medrol was injected adjacent to the superficial tendon sheath distal to the A1 pulley.  Patient tolerated procedure well.  Follow-up instructions reviewed - methylPREDNISolone acetate (DEPO-MEDROL) injection 10 mg   Dorothyann Peng, NP

## 2020-08-17 DIAGNOSIS — M1711 Unilateral primary osteoarthritis, right knee: Secondary | ICD-10-CM | POA: Diagnosis not present

## 2020-08-17 DIAGNOSIS — M1712 Unilateral primary osteoarthritis, left knee: Secondary | ICD-10-CM | POA: Diagnosis not present

## 2020-08-17 DIAGNOSIS — M170 Bilateral primary osteoarthritis of knee: Secondary | ICD-10-CM | POA: Diagnosis not present

## 2020-08-22 ENCOUNTER — Other Ambulatory Visit (HOSPITAL_COMMUNITY): Payer: Self-pay

## 2020-08-22 ENCOUNTER — Other Ambulatory Visit: Payer: Self-pay | Admitting: Internal Medicine

## 2020-08-24 ENCOUNTER — Other Ambulatory Visit (HOSPITAL_COMMUNITY): Payer: Self-pay

## 2020-08-24 ENCOUNTER — Other Ambulatory Visit: Payer: Self-pay | Admitting: Internal Medicine

## 2020-08-25 NOTE — Telephone Encounter (Signed)
Check [place] registry last filled  12/10/2019. MD is out of the office pls advise on refill.Marland KitchenJohny Chess

## 2020-08-25 NOTE — Telephone Encounter (Signed)
Since this is not a regular medication for patient should await PCP or if she has new pain can offer visit sooner for the pain.

## 2020-08-29 ENCOUNTER — Other Ambulatory Visit (HOSPITAL_COMMUNITY): Payer: Self-pay

## 2020-08-29 MED ORDER — TRAMADOL HCL 50 MG PO TABS
50.0000 mg | ORAL_TABLET | Freq: Four times a day (QID) | ORAL | 2 refills | Status: DC
  Filled 2020-08-29: qty 100, 25d supply, fill #0

## 2020-08-31 DIAGNOSIS — H52203 Unspecified astigmatism, bilateral: Secondary | ICD-10-CM | POA: Diagnosis not present

## 2020-08-31 DIAGNOSIS — H25013 Cortical age-related cataract, bilateral: Secondary | ICD-10-CM | POA: Diagnosis not present

## 2020-08-31 DIAGNOSIS — E113293 Type 2 diabetes mellitus with mild nonproliferative diabetic retinopathy without macular edema, bilateral: Secondary | ICD-10-CM | POA: Diagnosis not present

## 2020-08-31 DIAGNOSIS — Z7984 Long term (current) use of oral hypoglycemic drugs: Secondary | ICD-10-CM | POA: Diagnosis not present

## 2020-08-31 DIAGNOSIS — E119 Type 2 diabetes mellitus without complications: Secondary | ICD-10-CM | POA: Diagnosis not present

## 2020-08-31 DIAGNOSIS — H5203 Hypermetropia, bilateral: Secondary | ICD-10-CM | POA: Diagnosis not present

## 2020-08-31 DIAGNOSIS — H524 Presbyopia: Secondary | ICD-10-CM | POA: Diagnosis not present

## 2020-08-31 DIAGNOSIS — H25043 Posterior subcapsular polar age-related cataract, bilateral: Secondary | ICD-10-CM | POA: Diagnosis not present

## 2020-08-31 DIAGNOSIS — H2513 Age-related nuclear cataract, bilateral: Secondary | ICD-10-CM | POA: Diagnosis not present

## 2020-09-19 ENCOUNTER — Other Ambulatory Visit (HOSPITAL_COMMUNITY): Payer: Self-pay

## 2020-09-20 ENCOUNTER — Other Ambulatory Visit (HOSPITAL_COMMUNITY): Payer: Self-pay

## 2020-09-29 DIAGNOSIS — Z1231 Encounter for screening mammogram for malignant neoplasm of breast: Secondary | ICD-10-CM | POA: Diagnosis not present

## 2020-09-29 DIAGNOSIS — Z1239 Encounter for other screening for malignant neoplasm of breast: Secondary | ICD-10-CM | POA: Diagnosis not present

## 2020-09-29 LAB — HM MAMMOGRAPHY

## 2020-10-11 ENCOUNTER — Encounter: Payer: Self-pay | Admitting: Internal Medicine

## 2020-10-24 ENCOUNTER — Other Ambulatory Visit: Payer: Self-pay | Admitting: Internal Medicine

## 2020-10-24 ENCOUNTER — Other Ambulatory Visit (HOSPITAL_COMMUNITY): Payer: Self-pay

## 2020-10-25 ENCOUNTER — Other Ambulatory Visit (HOSPITAL_COMMUNITY): Payer: Self-pay

## 2020-10-25 MED ORDER — SPIRONOLACTONE 50 MG PO TABS
ORAL_TABLET | Freq: Every day | ORAL | 3 refills | Status: DC
  Filled 2020-10-25: qty 90, 90d supply, fill #0
  Filled 2021-02-06: qty 90, 90d supply, fill #1
  Filled 2021-05-08: qty 90, 90d supply, fill #2
  Filled 2021-09-06: qty 90, 90d supply, fill #3

## 2020-11-03 ENCOUNTER — Encounter: Payer: Self-pay | Admitting: Internal Medicine

## 2020-11-03 ENCOUNTER — Other Ambulatory Visit: Payer: Self-pay

## 2020-11-03 ENCOUNTER — Ambulatory Visit: Payer: 59 | Admitting: Internal Medicine

## 2020-11-03 VITALS — BP 140/82 | HR 80 | Ht 64.0 in | Wt 202.8 lb

## 2020-11-03 DIAGNOSIS — E785 Hyperlipidemia, unspecified: Secondary | ICD-10-CM

## 2020-11-03 DIAGNOSIS — E113592 Type 2 diabetes mellitus with proliferative diabetic retinopathy without macular edema, left eye: Secondary | ICD-10-CM

## 2020-11-03 DIAGNOSIS — Z6835 Body mass index (BMI) 35.0-35.9, adult: Secondary | ICD-10-CM | POA: Diagnosis not present

## 2020-11-03 DIAGNOSIS — E1169 Type 2 diabetes mellitus with other specified complication: Secondary | ICD-10-CM

## 2020-11-03 LAB — POCT GLYCOSYLATED HEMOGLOBIN (HGB A1C): Hemoglobin A1C: 6 % — AB (ref 4.0–5.6)

## 2020-11-03 NOTE — Progress Notes (Signed)
Patient ID: Katelyn Peterson, female   DOB: Nov 19, 1945, 75 y.o.   MRN: [id number]  This visit occurred during the SARS-CoV-2 public health emergency.  Safety protocols were in place, including screening questions prior to the visit, additional usage of staff PPE, and extensive cleaning of exam room while observing appropriate contact time as indicated for disinfecting solutions.   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 Last visit 6 months ago.  Interim history: No increased urination, blurry vision, nausea, chest pain. She cut down white starches since last visit.  Reviewed HbA1c levels: Lab Results  Component Value Date   HGBA1C 6.2 (A) 05/05/2020   HGBA1C 6.1 (A) 11/03/2019   HGBA1C 6.2 (A) 05/05/2019   Pt is on a regimen of: - Metformin 1000 mg twice a day - Januvia 100 mg daily in am We stopped Amaryl 2 mg bid and Actos 30 mg daily for daily hypoglycemia events and possible SEs, respectively.  Pt checks her sugars once a day: - am: 71, 78, 80-108, 120 >> 62-114, 125, 140 >> 83-115, 120 - 2h after b'fast: n/c >> 131 >> n/c >> 114 >> n/c - before lunch: n/c >> 93 >> 100-110 >> n/c - 2h after lunch: 1n/c>> 110, 121 >> n/c - before dinner: 105, 115 >> n/c >> 102 >> n/c - 2h after dinner: 135, 135 >> n/c - bedtime:   90 >> 70 >> n/c Lowest sugar was 80 >> 88 >> 71 >> 62 >> 83;  she has hypoglycemia awareness in the 60s. Highest sugar was 114 >> 110 >> 120 >> 140  -No CKD, last BUN/creatinine:  Lab Results  Component Value Date   BUN 19 06/08/2020   CREATININE 1.00 06/08/2020  On quinapril 40.  Normal ACR: Lab Results  Component Value Date   MICRALBCREAT 1.4 06/08/2020   MICRALBCREAT 2.3 06/09/2019   MICRALBCREAT 0.9 06/10/2018   MICRALBCREAT 1.5 01/07/2017   MICRALBCREAT 0.9 05/08/2016   -+ HL; last set of lipids: Lab Results  Component Value  Date   CHOL 157 06/08/2020   HDL 60.10 06/08/2020   LDLCALC 86 06/08/2020   LDLDIRECT 146.5 09/18/2012   TRIG 51.0 06/08/2020   CHOLHDL 3 06/08/2020  She had leg cramps with statins: Rosuvastatin, pravastatin, atorvastatin and fluvastatin XL.  She refused to try Zetia.  However, she now does well on Lipitor low-dose every other day (or sometimes 2x a week) and she uses a muscle rub cream for cramps.  - last eye exam was in 08/2020: reportedly + OS PDR, no macular edema; Dr. Edyth Gunnels.  -She denies numbness and tingling in her feet.  She has a history of HTN. She has been considered for lap band surgery >> she did not want to follow through with this. She also has bilateral chronic lower extremity cramps, right knee pain, anemia.  Latest TSH was normal: Lab Results  Component Value Date   TSH 1.89 06/08/2020   ROS: Constitutional: no weight gain/no weight loss, no fatigue, no subjective hyperthermia, no subjective hypothermia Eyes: no blurry vision, no xerophthalmia ENT: no sore throat, no nodules palpated in neck, no dysphagia, no odynophagia, no hoarseness Cardiovascular: no CP/no SOB/no palpitations/no leg swelling Respiratory: no cough/no SOB/no wheezing Gastrointestinal: no N/no V/no D/no C/no acid reflux Musculoskeletal: + muscle aches (statins)/no joint aches Skin: no rashes, no hair loss Neurological: no tremors/no numbness/no tingling/no dizziness  I reviewed pt's  medications, allergies, PMH, social hx, family hx, and changes were documented in the history of present illness. Otherwise, unchanged from my initial visit note.  Past Medical History:  Diagnosis Date   Arthritis    HTN (hypertension)    Hyperlipidemia    Obesity    Type II or unspecified type diabetes mellitus without mention of complication, not stated as uncontrolled    Past Surgical History:  Procedure Laterality Date   ABDOMINAL HYSTERECTOMY     Social History   Socioeconomic History   Marital  status: Married    Spouse name: Not on file   Number of children: Not on file   Years of education: Not on file   Highest education level: Not on file  Occupational History   Occupation: [employer]  Tobacco Use   Smoking status: Never   Smokeless tobacco: Never  Substance and Sexual Activity   Alcohol use: No   Drug use: No   Sexual activity: Yes    Partners: Male  Other Topics Concern   Not on file  Social History Narrative   Regular exercise: seldom   Caffeine use: occasionally         Social Determinants of Health   Financial Resource Strain: Not on file  Food Insecurity: Not on file  Transportation Needs: Not on file  Physical Activity: Not on file  Stress: Not on file  Social Connections: Not on file  Intimate Partner Violence: Not on file   Current Outpatient Medications on File Prior to Visit  Medication Sig Dispense Refill   atorvastatin (LIPITOR) 20 MG tablet TAKE 1 TABLET BY MOUTH ONCE DAILY 90 tablet 3   cetirizine (ZYRTEC) 10 MG tablet TAKE ONE TABLET BY MOUTH ONCE DAILY 70 tablet 5   Cholecalciferol 1000 UNITS tablet Take 1,000 Units by mouth daily.     cyclobenzaprine (FLEXERIL) 5 MG tablet TAKE 1 TABLET BY MOUTH AT BEDTIME AS NEEDED FOR MUSCLE SPASMS. 30 tablet 0   Diclofenac Sodium 2 % SOLN Apply 1 pump twice daily. 112 g 3   ibuprofen (ADVIL,MOTRIN) 600 MG tablet TAKE 1 TABLET BY MOUTH TWICE DAILY AFTER MEALS FOR 1 WEEK, THEN AS NEEDED FOR PAIN 60 tablet 2   loratadine (CLARITIN) 10 MG tablet Take 1 tablet (10 mg total) by mouth daily. 90 tablet 0   Menthol, Topical Analgesic, (BIOFREEZE) 4 % GEL Apply to affected area daily as needed 150 mL 0   metFORMIN (GLUCOPHAGE) 1000 MG tablet TAKE 1 TABLET BY MOUTH 2 TIMES DAILY WITH A MEAL 180 tablet 3   OVER THE COUNTER MEDICATION Goody Powder for headaches. PRN     quinapril (ACCUPRIL) 40 MG tablet TAKE 1 TABLET BY MOUTH ONCE DAILY 90 tablet 3   sitaGLIPtin (JANUVIA) 100 MG tablet TAKE 1 TABLET BY MOUTH  ONCE DAILY IN THE MORNING 90 tablet 3   spironolactone (ALDACTONE) 50 MG tablet TAKE 1 TABLET BY MOUTH ONCE DAILY 90 tablet 3   traMADol (ULTRAM) 50 MG tablet Take 1 tablet (50 mg total) by mouth 4 (four) times daily. 100 tablet 2   verapamil (VERELAN PM) 240 MG 24 hr capsule Take 1 capsule (240 mg total) by mouth at bedtime. 90 capsule 3   Current Facility-Administered Medications on File Prior to Visit  Medication Dose Route Frequency Provider Last Rate Last Admin   0.9 %  sodium chloride infusion  500 mL Intravenous Continuous Milus Banister, MD       Allergies  Allergen Reactions  Atorvastatin     REACTION: cramps   Hydrochlorothiazide     REACTION: Cramps   Pravastatin     cramps   Family History  Problem Relation Age of Onset   Hypertension Mother    Diabetes Mother    Hypertension Father    Hypertension Other    Colon cancer Neg Hx    Esophageal cancer Neg Hx    Rectal cancer Neg Hx    Stomach cancer Neg Hx    PE: BP 140/82 (BP Location: Right Arm, Patient Position: Sitting, Cuff Size: Normal)   Pulse 80   Ht '5\' 4"'$  (1.626 m)   Wt 202 lb 12.8 oz (92 kg)   SpO2 98%   BMI 34.81 kg/m  Body mass index is 34.81 kg/m.  Wt Readings from Last 3 Encounters:  11/03/20 202 lb 12.8 oz (92 kg)  08/09/20 199 lb (90.3 kg)  06/08/20 200 lb (90.7 kg)   Constitutional: overweight, in NAD Eyes: PERRLA, EOMI, no exophthalmos ENT: moist mucous membranes, no thyromegaly, no cervical lymphadenopathy Cardiovascular: RRR, No MRG, + mild peri ankle swelling bilaterally, pitting Respiratory: CTA B Gastrointestinal: abdomen soft, NT, ND, BS+ Musculoskeletal: no deformities, strength intact in all 4 Skin: moist, warm, no rashes Neurological: no tremor with outstretched hands, DTR normal in all 4  ASSESSMENT: 1. 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 Hyperlipidemia - Developed leg cramps with pravastatin, atorvastatin, Crestor, fluvastatin  3.   Obesity class II  4. Goiter by palpation 11/04/2012: Thyroid U/S: Right thyroid lobe: 4.3 x 1.0 x 1.8 cm  Left thyroid lobe: 4.6 x 1.2 x 1.9 cm  Isthmus: 5 mm  Focal nodules: Thyroid echotexture is homogeneous, without solid or cystic lesion.  Lymphadenopathy: None visualized.  IMPRESSION:  Normal thyroid ultrasound. No evidence of thyromegaly or dominant solid/cystic mass.  -No further intervention needed for this  PLAN:  1. DM2 -Patient with longstanding, well-controlled, type 2 diabetes, on oral antidiabetic regimen with metformin and DPP 4 inhibitor, with good control.  She is only checking sugars in the morning despite repeated advised to also check some values later in the day.  HbA1c remains very well controlled, last being 6.2%, stable, at goal.  We did not change her regimen at that time. -At today's visit, sugars remain excellently controlled, in fact, they are even improved compared to before.  They now fluctuate between 80s and 115 with only 1 value at 120.  She is still not checking later in the day.  We will continue the current regimen for now. - I suggested to:  Patient Instructions  Please continue: - Metformin 1000 mg 2x a day - Januvia 100 mg daily in am  Please come back for a follow-up appointment in 6 months.  - we checked her HbA1c: 6.0% (improved) - advised to check sugars at different times of the day - 1x a day, rotating check times - advised for yearly eye exams >> she is UTD - return to clinic in 6 months  2. Hyperlipidemia -Reviewed latest lipid panel from 05/2020: LDL slightly above goal, the rest of fractions excellent: Lab Results  Component Value Date   CHOL 157 06/08/2020   HDL 60.10 06/08/2020   LDLCALC 86 06/08/2020   LDLDIRECT 146.5 09/18/2012   TRIG 51.0 06/08/2020   CHOLHDL 3 06/08/2020  -She had leg cramps from Crestor low-dose, pravastatin, atorvastatin, fluvastatin XL.  We then started Lipitor 20 mg every 2 to 3 days and we were able  to  move it closer together.  We discussed in the past about possibly using co-Q10 and magnesium to help with muscle aches  -She is now on Lipitor 20 mg every 2 to 3 days  3.  Obesity class II -We will continue metformin and Januvia which are both weight neutral -He gained 4 pounds before last visit, previously lost 5 -wt stable since last OV (today she was weighed with her shoes on)  Philemon Kingdom, MD PhD [facility] Endocr[facility]

## 2020-11-03 NOTE — Patient Instructions (Signed)
Please continue: - Metformin 1000 mg 2x a day - Januvia 100 mg daily in am  Please come back for a follow-up appointment in 6 months.

## 2020-12-14 ENCOUNTER — Encounter: Payer: Self-pay | Admitting: Internal Medicine

## 2020-12-14 ENCOUNTER — Other Ambulatory Visit: Payer: Self-pay

## 2020-12-14 ENCOUNTER — Ambulatory Visit: Payer: 59 | Admitting: Internal Medicine

## 2020-12-14 ENCOUNTER — Other Ambulatory Visit (HOSPITAL_COMMUNITY): Payer: Self-pay

## 2020-12-14 DIAGNOSIS — E785 Hyperlipidemia, unspecified: Secondary | ICD-10-CM

## 2020-12-14 DIAGNOSIS — N183 Chronic kidney disease, stage 3 unspecified: Secondary | ICD-10-CM | POA: Diagnosis not present

## 2020-12-14 DIAGNOSIS — E113592 Type 2 diabetes mellitus with proliferative diabetic retinopathy without macular edema, left eye: Secondary | ICD-10-CM

## 2020-12-14 DIAGNOSIS — M25561 Pain in right knee: Secondary | ICD-10-CM | POA: Diagnosis not present

## 2020-12-14 DIAGNOSIS — E1169 Type 2 diabetes mellitus with other specified complication: Secondary | ICD-10-CM

## 2020-12-14 DIAGNOSIS — Z6835 Body mass index (BMI) 35.0-35.9, adult: Secondary | ICD-10-CM

## 2020-12-14 LAB — COMPREHENSIVE METABOLIC PANEL
ALT: 12 U/L (ref 0–35)
AST: 13 U/L (ref 0–37)
Albumin: 4.1 g/dL (ref 3.5–5.2)
Alkaline Phosphatase: 79 U/L (ref 39–117)
BUN: 18 mg/dL (ref 6–23)
CO2: 26 mEq/L (ref 19–32)
Calcium: 10.1 mg/dL (ref 8.4–10.5)
Chloride: 107 mEq/L (ref 96–112)
Creatinine, Ser: 1.22 mg/dL — ABNORMAL HIGH (ref 0.40–1.20)
GFR: 43.64 mL/min — ABNORMAL LOW (ref >60.00)
Glucose, Bld: 107 mg/dL — ABNORMAL HIGH (ref 70–99)
Potassium: 4.6 mEq/L (ref 3.5–5.1)
Sodium: 140 mEq/L (ref 135–145)
Total Bilirubin: 0.4 mg/dL (ref 0.2–1.2)
Total Protein: 7.5 g/dL (ref 6.0–8.3)

## 2020-12-14 LAB — LIPID PANEL
Cholesterol: 193 mg/dL (ref 0–200)
HDL: 57 mg/dL (ref >39.00)
LDL Cholesterol: 121 mg/dL — ABNORMAL HIGH (ref 0–99)
NonHDL: 135.75
Total CHOL/HDL Ratio: 3
Triglycerides: 72 mg/dL (ref 0.0–149.0)
VLDL: 14.4 mg/dL (ref 0.0–40.0)

## 2020-12-14 LAB — CBC WITH DIFFERENTIAL/PLATELET
Basophils Absolute: 0.1 10*3/uL (ref 0.0–0.1)
Basophils Relative: 0.8 % (ref 0.0–3.0)
Eosinophils Absolute: 0.2 10*3/uL (ref 0.0–0.7)
Eosinophils Relative: 3.1 % (ref 0.0–5.0)
HCT: 34.2 % — ABNORMAL LOW (ref 36.0–46.0)
Hemoglobin: 11.1 g/dL — ABNORMAL LOW (ref 12.0–15.0)
Lymphocytes Relative: 28.1 % (ref 12.0–46.0)
Lymphs Abs: 1.7 10*3/uL (ref 0.7–4.0)
MCHC: 32.5 g/dL (ref 30.0–36.0)
MCV: 90.6 fl (ref 78.0–100.0)
Monocytes Absolute: 0.4 10*3/uL (ref 0.1–1.0)
Monocytes Relative: 7 % (ref 3.0–12.0)
Neutro Abs: 3.7 10*3/uL (ref 1.4–7.7)
Neutrophils Relative %: 61 % (ref 43.0–77.0)
Platelets: 268 10*3/uL (ref 150.0–400.0)
RBC: 3.78 Mil/uL — ABNORMAL LOW (ref 3.87–5.11)
RDW: 14.4 % (ref 11.5–15.5)
WBC: 6.1 10*3/uL (ref 4.0–10.5)

## 2020-12-14 LAB — HEMOGLOBIN A1C: Hgb A1c MFr Bld: 6.6 % — ABNORMAL HIGH (ref 4.6–6.5)

## 2020-12-14 MED ORDER — VERAPAMIL HCL ER 240 MG PO CP24
240.0000 mg | ORAL_CAPSULE | Freq: Every day | ORAL | 3 refills | Status: DC
  Filled 2020-12-14: qty 90, 90d supply, fill #0
  Filled 2021-04-24: qty 90, 90d supply, fill #1
  Filled 2021-09-06: qty 90, 90d supply, fill #2

## 2020-12-14 MED ORDER — SITAGLIPTIN PHOSPHATE 100 MG PO TABS
ORAL_TABLET | Freq: Every morning | ORAL | 3 refills | Status: DC
  Filled 2020-12-14: qty 30, 30d supply, fill #0
  Filled 2021-02-06: qty 30, 30d supply, fill #1
  Filled 2021-03-15 (×2): qty 30, 30d supply, fill #2
  Filled 2021-04-24: qty 30, 30d supply, fill #3
  Filled 2021-05-31: qty 30, 30d supply, fill #4
  Filled 2021-07-03: qty 30, 30d supply, fill #5
  Filled 2021-08-01: qty 30, 30d supply, fill #6
  Filled 2021-08-29: qty 30, 30d supply, fill #7
  Filled 2021-10-10: qty 30, 30d supply, fill #8
  Filled 2021-11-09: qty 30, 30d supply, fill #9

## 2020-12-14 NOTE — Patient Instructions (Signed)
[place] shoes Spenco inserts

## 2020-12-14 NOTE — Progress Notes (Signed)
Subjective:  Patient ID: Katelyn Peterson, female    DOB: 01-08-1946  Age: 75 y.o. MRN: [id number]  CC: Follow-up (6 month f/u)   HPI Brinda Jani Gravel presents for HTN, DM, dyslipidemia C/o R foot pain  Outpatient Medications Prior to Visit  Medication Sig Dispense Refill   atorvastatin (LIPITOR) 20 MG tablet TAKE 1 TABLET BY MOUTH ONCE DAILY 90 tablet 3   cetirizine (ZYRTEC) 10 MG tablet TAKE ONE TABLET BY MOUTH ONCE DAILY 70 tablet 5   Cholecalciferol 1000 UNITS tablet Take 1,000 Units by mouth daily.     cyclobenzaprine (FLEXERIL) 5 MG tablet TAKE 1 TABLET BY MOUTH AT BEDTIME AS NEEDED FOR MUSCLE SPASMS. 30 tablet 0   Diclofenac Sodium 2 % SOLN Apply 1 pump twice daily. 112 g 3   ibuprofen (ADVIL,MOTRIN) 600 MG tablet TAKE 1 TABLET BY MOUTH TWICE DAILY AFTER MEALS FOR 1 WEEK, THEN AS NEEDED FOR PAIN 60 tablet 2   loratadine (CLARITIN) 10 MG tablet Take 1 tablet (10 mg total) by mouth daily. 90 tablet 0   Menthol, Topical Analgesic, (BIOFREEZE) 4 % GEL Apply to affected area daily as needed 150 mL 0   metFORMIN (GLUCOPHAGE) 1000 MG tablet TAKE 1 TABLET BY MOUTH 2 TIMES DAILY WITH A MEAL 180 tablet 3   OVER THE COUNTER MEDICATION Goody Powder for headaches. PRN     quinapril (ACCUPRIL) 40 MG tablet TAKE 1 TABLET BY MOUTH ONCE DAILY 90 tablet 3   spironolactone (ALDACTONE) 50 MG tablet TAKE 1 TABLET BY MOUTH ONCE DAILY 90 tablet 3   traMADol (ULTRAM) 50 MG tablet Take 1 tablet (50 mg total) by mouth 4 (four) times daily. 100 tablet 2   sitaGLIPtin (JANUVIA) 100 MG tablet TAKE 1 TABLET BY MOUTH ONCE DAILY IN THE MORNING 90 tablet 3   verapamil (VERELAN PM) 240 MG 24 hr capsule Take 1 capsule (240 mg total) by mouth at bedtime. 90 capsule 3   0.9 %  sodium chloride infusion      No facility-administered medications prior to visit.    ROS: Review of Systems  Constitutional:  Negative for activity change, appetite change, chills, fatigue and unexpected weight change.  HENT:   Negative for congestion, mouth sores and sinus pressure.   Eyes:  Negative for visual disturbance.  Respiratory:  Negative for cough and chest tightness.   Gastrointestinal:  Negative for abdominal pain and nausea.  Genitourinary:  Negative for difficulty urinating, frequency and vaginal pain.  Musculoskeletal:  Positive for arthralgias and gait problem. Negative for back pain.  Skin:  Negative for pallor and rash.  Neurological:  Negative for dizziness, tremors, weakness, numbness and headaches.  Psychiatric/Behavioral:  Negative for confusion and sleep disturbance.    Objective:  BP (!) 142/76 (BP Location: Left Arm)   Pulse 76   Temp 98.3 F (36.8 C) (Oral)   Ht 5\' 4"  (1.626 m)   Wt 197 lb 3.2 oz (89.4 kg)   SpO2 98%   BMI 33.85 kg/m   BP Readings from Last 3 Encounters:  12/14/20 (!) 142/76  11/03/20 140/82  08/09/20 (!) 150/80    Wt Readings from Last 3 Encounters:  12/14/20 197 lb 3.2 oz (89.4 kg)  11/03/20 202 lb 12.8 oz (92 kg)  08/09/20 199 lb (90.3 kg)    Physical Exam Constitutional:      General: She is not in acute distress.    Appearance: She is well-developed. She is obese.  HENT:  Head: Normocephalic.     Right Ear: External ear normal.     Left Ear: External ear normal.     Nose: Nose normal.  Eyes:     General:        Right eye: No discharge.        Left eye: No discharge.     Conjunctiva/sclera: Conjunctivae normal.     Pupils: Pupils are equal, round, and reactive to light.  Neck:     Thyroid: No thyromegaly.     Vascular: No JVD.     Trachea: No tracheal deviation.  Cardiovascular:     Rate and Rhythm: Normal rate and regular rhythm.     Heart sounds: Normal heart sounds.  Pulmonary:     Effort: No respiratory distress.     Breath sounds: No stridor. No wheezing.  Abdominal:     General: Bowel sounds are normal. There is no distension.     Palpations: Abdomen is soft. There is no mass.     Tenderness: There is no abdominal  tenderness. There is no guarding or rebound.  Musculoskeletal:        General: Tenderness present.     Cervical back: Normal range of motion and neck supple. No rigidity.  Lymphadenopathy:     Cervical: No cervical adenopathy.  Skin:    Findings: No erythema or rash.  Neurological:     Mental Status: She is oriented to person, place, and time.     Cranial Nerves: No cranial nerve deficit.     Motor: No abnormal muscle tone.     Coordination: Coordination normal.     Deep Tendon Reflexes: Reflexes normal.  Psychiatric:        Behavior: Behavior normal.        Thought Content: Thought content normal.        Judgment: Judgment normal.  R knee w/pain  Lab Results  Component Value Date   WBC 5.8 06/09/2019   HGB 10.1 (L) 06/09/2019   HCT 30.8 (L) 06/09/2019   PLT 233.0 06/09/2019   GLUCOSE 112 (H) 06/08/2020   CHOL 157 06/08/2020   TRIG 51.0 06/08/2020   HDL 60.10 06/08/2020   LDLDIRECT 146.5 09/18/2012   LDLCALC 86 06/08/2020   ALT 15 06/08/2020   AST 11 06/08/2020   NA 140 06/08/2020   K 4.5 06/08/2020   CL 108 06/08/2020   CREATININE 1.00 06/08/2020   BUN 19 06/08/2020   CO2 25 06/08/2020   TSH 1.89 06/08/2020   HGBA1C 6.0 (A) 11/03/2020   MICROALBUR 1.8 06/08/2020    DG Knee 1-2 Views Right  Result Date: 06/20/2017 CLINICAL DATA:  Right knee pain and swelling for several days, no known injury, initial encounter EXAM: RIGHT KNEE - 2 VIEW COMPARISON:  None. FINDINGS: Tricompartmental degenerative changes are noted worst in the medial joint space. Multiple well calcified densities are identified in the suprapatellar bursa consistent with loose bodies. Some additional loose bodies are also noted in the posterior aspect of the joint. 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 No acute abnormality is noted. Electronically Signed   By: Inez Catalina M.D.   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)    Cont on Metformin, Januvia,  Lipitor Check A1c      Relevant Medications   sitaGLIPtin (JANUVIA) 100  MG tablet   Other Relevant Orders   Comprehensive metabolic panel   CBC with Differential/Platelet   Hemoglobin A1c   Lipid panel   Dyslipidemia associated with type 2 diabetes mellitus (HCC)    Cont on Lipitor Check lipids      Relevant Medications   sitaGLIPtin (JANUVIA) 100 MG tablet   Other Relevant Orders   Comprehensive metabolic panel   Lipid panel   Obesity    Loosing wt - cut back on carbs Wt Readings from Last 3 Encounters:  12/14/20 197 lb 3.2 oz (89.4 kg)  11/03/20 202 lb 12.8 oz (92 kg)  08/09/20 199 lb (90.3 kg)         Relevant Medications   sitaGLIPtin (JANUVIA) 100 MG tablet      Follow-up: Return in about 6 months (around 06/14/2021) for Wellness Exam.  Walker Kehr, MD

## 2020-12-14 NOTE — Assessment & Plan Note (Signed)
Loosing wt - cut back on carbs Wt Readings from Last 3 Encounters:  12/14/20 197 lb 3.2 oz (89.4 kg)  11/03/20 202 lb 12.8 oz (92 kg)  08/09/20 199 lb (90.3 kg)

## 2020-12-14 NOTE — Assessment & Plan Note (Signed)
Tramadol prn  Potential benefits of a long term opioids use as well as potential risks (i.e. addiction risk, apnea etc) and complications (i.e. Somnolence, constipation and others) were explained to the patient and were aknowledged. Shoe inserts

## 2020-12-14 NOTE — Assessment & Plan Note (Signed)
Cont on Metformin, Januvia,  Lipitor Check A1c

## 2020-12-14 NOTE — Addendum Note (Signed)
Addended by: Boris Lown B on: 12/14/2020 09:54 AM   Modules accepted: Orders

## 2020-12-14 NOTE — Assessment & Plan Note (Signed)
Cont on Lipitor Check lipids

## 2020-12-15 ENCOUNTER — Other Ambulatory Visit (HOSPITAL_COMMUNITY): Payer: Self-pay

## 2020-12-15 DIAGNOSIS — N183 Chronic kidney disease, stage 3 unspecified: Secondary | ICD-10-CM | POA: Insufficient documentation

## 2020-12-15 NOTE — Addendum Note (Signed)
Addended by: Cassandria Anger on: 12/15/2020 07:46 AM   Modules accepted: Orders

## 2020-12-15 NOTE — Assessment & Plan Note (Signed)
GFR  is slightly worse.  She needs to hydrate herself well.  I will order renal ultrasound.  I would like to refer her to see a nephrologist

## 2020-12-16 ENCOUNTER — Other Ambulatory Visit (HOSPITAL_COMMUNITY): Payer: Self-pay

## 2020-12-22 ENCOUNTER — Telehealth: Payer: Self-pay | Admitting: *Deleted

## 2020-12-22 DIAGNOSIS — N183 Chronic kidney disease, stage 3 unspecified: Secondary | ICD-10-CM

## 2020-12-22 NOTE — Telephone Encounter (Signed)
Rec'd msg pt is wanting to know...  [7:59 AM] Sabra Heck, Khamil Can i wait and try to drink more before he set me up for that test and see what happen.

## 2020-12-23 NOTE — Telephone Encounter (Signed)
Notified pt w/MD response.../lmb 

## 2020-12-23 NOTE — Telephone Encounter (Signed)
Yes.  Thank you I will order labs

## 2021-01-02 ENCOUNTER — Other Ambulatory Visit: Payer: Self-pay

## 2021-01-02 ENCOUNTER — Other Ambulatory Visit (INDEPENDENT_AMBULATORY_CARE_PROVIDER_SITE_OTHER): Payer: 59

## 2021-01-02 DIAGNOSIS — N183 Chronic kidney disease, stage 3 unspecified: Secondary | ICD-10-CM

## 2021-01-02 LAB — COMPREHENSIVE METABOLIC PANEL
ALT: 12 U/L (ref 0–35)
AST: 11 U/L (ref 0–37)
Albumin: 4 g/dL (ref 3.5–5.2)
Alkaline Phosphatase: 91 U/L (ref 39–117)
BUN: 21 mg/dL (ref 6–23)
CO2: 25 mEq/L (ref 19–32)
Calcium: 9.3 mg/dL (ref 8.4–10.5)
Chloride: 106 mEq/L (ref 96–112)
Creatinine, Ser: 1.21 mg/dL — ABNORMAL HIGH (ref 0.40–1.20)
GFR: 44.06 mL/min — ABNORMAL LOW (ref >60.00)
Glucose, Bld: 116 mg/dL — ABNORMAL HIGH (ref 70–99)
Potassium: 4.5 mEq/L (ref 3.5–5.1)
Sodium: 139 mEq/L (ref 135–145)
Total Bilirubin: 0.3 mg/dL (ref 0.2–1.2)
Total Protein: 7.3 g/dL (ref 6.0–8.3)

## 2021-01-03 ENCOUNTER — Ambulatory Visit
Admission: RE | Admit: 2021-01-03 | Discharge: 2021-01-03 | Disposition: A | Discharge status: Home / Self Care | Payer: 59 | Source: Ambulatory Visit | Attending: Internal Medicine | Admitting: Internal Medicine

## 2021-01-03 DIAGNOSIS — N183 Chronic kidney disease, stage 3 unspecified: Secondary | ICD-10-CM

## 2021-01-03 DIAGNOSIS — N2889 Other specified disorders of kidney and ureter: Secondary | ICD-10-CM | POA: Diagnosis not present

## 2021-01-03 DIAGNOSIS — N189 Chronic kidney disease, unspecified: Secondary | ICD-10-CM | POA: Diagnosis not present

## 2021-02-06 ENCOUNTER — Telehealth: Payer: Self-pay | Admitting: Internal Medicine

## 2021-02-06 ENCOUNTER — Other Ambulatory Visit: Payer: Self-pay | Admitting: Internal Medicine

## 2021-02-06 ENCOUNTER — Other Ambulatory Visit (HOSPITAL_COMMUNITY): Payer: Self-pay

## 2021-02-06 MED ORDER — QUINAPRIL HCL 40 MG PO TABS
ORAL_TABLET | Freq: Every day | ORAL | 3 refills | Status: DC
  Filled 2021-02-06: qty 90, 90d supply, fill #0

## 2021-02-06 MED ORDER — LISINOPRIL 40 MG PO TABS
40.0000 mg | ORAL_TABLET | Freq: Every day | ORAL | 3 refills | Status: DC
  Filled 2021-02-06: qty 90, 90d supply, fill #0
  Filled 2021-05-08: qty 90, 90d supply, fill #1
  Filled 2021-09-07: qty 90, 90d supply, fill #2

## 2021-02-06 NOTE — Telephone Encounter (Signed)
Sandy from Pharmacy has called and states quinapril (ACCUPRIL) 40 MG tablet has been discontinued. No release date at this time. Wanting to know if provider wants to send another prescription in.    Please advise.    Callback #- [phone]

## 2021-02-06 NOTE — Telephone Encounter (Signed)
Sent in lisinopril 40 mg daily instead which is equivalent dosing.

## 2021-02-06 NOTE — Telephone Encounter (Signed)
Pls advise in absense of MD../lmb

## 2021-02-07 ENCOUNTER — Other Ambulatory Visit (HOSPITAL_COMMUNITY): Payer: Self-pay

## 2021-02-10 ENCOUNTER — Other Ambulatory Visit (HOSPITAL_COMMUNITY): Payer: Self-pay

## 2021-02-13 ENCOUNTER — Telehealth: Payer: Self-pay

## 2021-02-16 DIAGNOSIS — M1711 Unilateral primary osteoarthritis, right knee: Secondary | ICD-10-CM | POA: Diagnosis not present

## 2021-02-23 NOTE — Telephone Encounter (Signed)
Task sent

## 2021-03-15 ENCOUNTER — Other Ambulatory Visit (HOSPITAL_COMMUNITY): Payer: Self-pay

## 2021-04-07 NOTE — Progress Notes (Signed)
[facility]ore[facility] [address] [place] Phone: [phone] Subjective:   IVilma Meckel, am serving as a scribe for Dr. Hulan Saas. This visit occurred during the SARS-CoV-2 public health emergency.  Safety protocols were in place, including screening questions prior to the visit, additional usage of staff PPE, and extensive cleaning of exam room while observing appropriate contact time as indicated for disinfecting solutions.   I'm seeing this patient by the request  of:  Plotnikov, Evie Lacks, MD  CC: Left foot pain  MHD:[id number]  Katelyn Peterson is a 76 y.o. female coming in with complaint of L foot pain. Last seen in 2017 for L ankle arthritis. Patient states that pain started acting up again. Pain is nagging and only occurs when walking. Located on the top of foot.  Patient describes the pain as a dull, throbbing aching pain.  Points towards more in the proximal aspect of the foot to the ankle.  Patient does not know if there has been any swelling.  Does not remember any true injury.  Has not changed activity recently.      Past Medical History:  Diagnosis Date   Arthritis    HTN (hypertension)    Hyperlipidemia    Obesity    Type II or unspecified type diabetes mellitus without mention of complication, not stated as uncontrolled    Past Surgical History:  Procedure Laterality Date   ABDOMINAL HYSTERECTOMY     Social History   Socioeconomic History   Marital status: Married    Spouse name: Not on file   Number of children: Not on file   Years of education: Not on file   Highest education level: Not on file  Occupational History   Occupation: Public house manager  Tobacco Use   Smoking status: Never   Smokeless tobacco: Never  Substance and Sexual Activity   Alcohol use: No   Drug use: No   Sexual activity: Yes    Partners: Male  Other Topics Concern   Not on file  Social History Narrative   Regular exercise: seldom    Caffeine use: occasionally         Social Determinants of Health   Financial Resource Strain: Not on file  Food Insecurity: Not on file  Transportation Needs: Not on file  Physical Activity: Not on file  Stress: Not on file  Social Connections: Not on file   Allergies  Allergen Reactions   Atorvastatin     REACTION: cramps   Hydrochlorothiazide     REACTION: Cramps   Pravastatin     cramps   Family History  Problem Relation Age of Onset   Hypertension Mother    Diabetes Mother    Hypertension Father    Hypertension Other    Colon cancer Neg Hx    Esophageal cancer Neg Hx    Rectal cancer Neg Hx    Stomach cancer Neg Hx     Current Outpatient Medications (Endocrine & Metabolic):    metFORMIN (GLUCOPHAGE) 1000 MG tablet, TAKE 1 TABLET BY MOUTH 2 TIMES DAILY WITH A MEAL   sitaGLIPtin (JANUVIA) 100 MG tablet, TAKE 1 TABLET BY MOUTH ONCE DAILY IN THE MORNING  Current Outpatient Medications (Cardiovascular):    atorvastatin (LIPITOR) 20 MG tablet, TAKE 1 TABLET BY MOUTH ONCE DAILY   lisinopril (ZESTRIL) 40 MG tablet, Take 1 tablet (40 mg total) by mouth daily.   spironolactone (ALDACTONE) 50 MG tablet, TAKE 1 TABLET BY MOUTH  ONCE DAILY   verapamil (VERELAN PM) 240 MG 24 hr capsule, Take 1 capsule (240 mg total) by mouth at bedtime.  Current Outpatient Medications (Respiratory):    cetirizine (ZYRTEC) 10 MG tablet, TAKE ONE TABLET BY MOUTH ONCE DAILY   loratadine (CLARITIN) 10 MG tablet, Take 1 tablet (10 mg total) by mouth daily.  Current Outpatient Medications (Analgesics):    ibuprofen (ADVIL,MOTRIN) 600 MG tablet, TAKE 1 TABLET BY MOUTH TWICE DAILY AFTER MEALS FOR 1 WEEK, THEN AS NEEDED FOR PAIN   traMADol (ULTRAM) 50 MG tablet, Take 1 tablet (50 mg total) by mouth 4 (four) times daily.   Current Outpatient Medications (Other):    Cholecalciferol 1000 UNITS tablet, Take 1,000 Units by mouth daily.   Diclofenac Sodium 2 % SOLN, Apply 1 pump twice daily.    Menthol, Topical Analgesic, (BIOFREEZE) 4 % GEL, Apply to affected area daily as needed   [place], [place] for headaches. PRN   Reviewed prior external information including notes and imaging from  primary care provider As well as notes that were available from care everywhere and other healthcare systems.  Past medical history, social, surgical and family history all reviewed in electronic medical record.  No pertanent information unless stated regarding to the chief complaint.   Review of Systems:  No headache, visual changes, nausea, vomiting, diarrhea, constipation, dizziness, abdominal pain, skin rash, fevers, chills, night sweats, weight loss, swollen lymph nodes, body aches, joint swelling, chest pain, shortness of breath, mood changes. POSITIVE muscle aches  Objective  Blood pressure 130/76, pulse 72, height 5\' 4"  (1.626 m), weight 201 lb (91.2 kg), SpO2 98 %.   General: No apparent distress alert and oriented x3 mood and affect normal, dressed appropriately.  Overweight HEENT: Pupils equal, extraocular movements intact  Respiratory: Patient's speak in full sentences and does not appear short of breath  Cardiovascular: Trace lower extremity edema, non tender, no erythema  Gait antalgic favoring the left foot. Patient's left ankle still has significant arthritis.  Patient does have some pes planus with overpronation of the hindfoot.  Limited muscular skeletal ultrasound was performed and interpreted by Hulan Saas, M  Limited ultrasound shows patient does have significant arthritic changes of the midfoot especially between the second and first metatarsals proximally.  Patient does have significant effusion noted of this joint.  Calcific changes noted as well. 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  Verbal informed consent obtained.  Time-out conducted.  Noted no overlying erythema, induration, or other signs of local infection.  Skin prepped in a sterile fashion.  Local anesthesia: Topical Ethyl chloride.  With sterile technique and under real time ultrasound guidance: With a 25-gauge half inch needle injected with 0.5 cc of 0.5% Marcaine and 0.5 cc of Kenalog 40 mg/mL into the midfoot where there is the most effusion. Completed without difficulty  Pain immediately improved suggesting accurate placement of the medication.  Advised to call if fevers/chills, erythema, induration, drainage, or persistent bleeding.  Impression: Technically successful ultrasound guided injection.    Impression and Recommendations:     The above documentation has been reviewed and is accurate and complete Lyndal Pulley, DO

## 2021-04-10 ENCOUNTER — Ambulatory Visit: Payer: Self-pay

## 2021-04-10 ENCOUNTER — Ambulatory Visit: Payer: 59 | Admitting: Family Medicine

## 2021-04-10 ENCOUNTER — Other Ambulatory Visit: Payer: Self-pay

## 2021-04-10 VITALS — BP 130/76 | HR 72 | Ht 64.0 in | Wt 201.0 lb

## 2021-04-10 DIAGNOSIS — M79672 Pain in left foot: Secondary | ICD-10-CM | POA: Diagnosis not present

## 2021-04-10 DIAGNOSIS — M19072 Primary osteoarthritis, left ankle and foot: Secondary | ICD-10-CM

## 2021-04-10 NOTE — Patient Instructions (Addendum)
Injection today Boot for 2 weeks daily K2 for 2 weeks See you again in 3 weeks

## 2021-04-11 ENCOUNTER — Encounter: Payer: Self-pay | Admitting: Family Medicine

## 2021-04-11 DIAGNOSIS — M19072 Primary osteoarthritis, left ankle and foot: Secondary | ICD-10-CM | POA: Insufficient documentation

## 2021-04-11 NOTE — Assessment & Plan Note (Addendum)
Patient given injection today and tolerated the procedure well, discussed icing regimen and home exercise, discussed which activities to doing which wants to avoid.  Do believe patient would do well with conservative therapy hopefully.  Discussed CAM Walker, vitamin D supplementation.  Worsening pain could consider advanced imaging.  Follow-up again in 3 to 4 weeks discussed the potential for of fracture but highly unlikely with no insidious onset

## 2021-04-18 ENCOUNTER — Other Ambulatory Visit (HOSPITAL_COMMUNITY): Payer: Self-pay

## 2021-04-24 ENCOUNTER — Other Ambulatory Visit (HOSPITAL_COMMUNITY): Payer: Self-pay

## 2021-04-24 MED FILL — Metformin HCl Tab 1000 MG: ORAL | 90 days supply | Qty: 180 | Fill #0 | Status: AC

## 2021-04-25 ENCOUNTER — Other Ambulatory Visit (HOSPITAL_COMMUNITY): Payer: Self-pay

## 2021-05-03 ENCOUNTER — Ambulatory Visit (INDEPENDENT_AMBULATORY_CARE_PROVIDER_SITE_OTHER): Payer: 59

## 2021-05-03 ENCOUNTER — Other Ambulatory Visit: Payer: Self-pay

## 2021-05-03 ENCOUNTER — Ambulatory Visit: Payer: 59 | Admitting: Sports Medicine

## 2021-05-03 VITALS — BP 120/70 | HR 70 | Ht 64.0 in

## 2021-05-03 DIAGNOSIS — M79672 Pain in left foot: Secondary | ICD-10-CM

## 2021-05-03 DIAGNOSIS — M19072 Primary osteoarthritis, left ankle and foot: Secondary | ICD-10-CM | POA: Diagnosis not present

## 2021-05-03 NOTE — Progress Notes (Signed)
Benito Mccreedy D.[place] Phone: [phone]   Assessment and Plan:     1. Left foot pain 2. 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  Overall, patient has had significant decrease in pain and is pain-free when walking in cam boot - Discontinue cam boot - Tylenol/NSAIDs as needed for pain control - Due to continued tenderness with palpation on physical exam, x-ray ordered at today's visit to rule out acute fracture.  My interpretation: Cortical changes most significant at [facility] with spurring representing moderate midfoot arthritis.  No acute fracture or dislocation - DG Foot Complete Left; Future    Pertinent previous records reviewed include none   Follow Up: As needed if no improvement or worsening of symptoms.  If symptoms return after discontinuation of cam boot walker, recommend calling our clinic and we can proceed with MRI of foot at that time to further evaluate   Subjective:   I, [place], am serving as a Education administrator for Doctor Glennon Mac  Chief Complaint: left foot pain   HPI:  04/10/2021 Nariah J Hargett is a 76 y.o. female coming in with complaint of L foot pain. Last seen in 2017 for L ankle arthritis. Patient states that pain started acting up again. Pain is nagging and only occurs when walking. Located on the top of foot.  Patient describes the pain as a dull, throbbing aching pain.  Points towards more in the proximal aspect of the foot to the ankle.  Patient does not know if there has been any swelling.  Does not remember any true injury.  Has not changed activity recently.  05/03/21 Patient states that the ankle is doing okay   Relevant Historical Information: Hypertension, DM type  II  Additional pertinent review of systems negative.   Current Outpatient Medications:    atorvastatin (LIPITOR) 20 MG tablet, TAKE 1 TABLET BY MOUTH ONCE DAILY, Disp: 90 tablet, Rfl: 3   cetirizine (ZYRTEC) 10 MG tablet, TAKE ONE TABLET BY MOUTH ONCE DAILY, Disp: 70 tablet, Rfl: 5   Cholecalciferol 1000 UNITS tablet, Take 1,000 Units by mouth daily., Disp: , Rfl:    Diclofenac Sodium 2 % SOLN, Apply 1 pump twice daily., Disp: 112 g, Rfl: 3   ibuprofen (ADVIL,MOTRIN) 600 MG tablet, TAKE 1 TABLET BY MOUTH TWICE DAILY AFTER MEALS FOR 1 WEEK, THEN AS NEEDED FOR PAIN, Disp: 60 tablet, Rfl: 2   lisinopril (ZESTRIL) 40 MG tablet, Take 1 tablet (40 mg total) by mouth daily., Disp: 90 tablet, Rfl: 3   loratadine (CLARITIN) 10 MG tablet, Take 1 tablet (10 mg total) by mouth daily., Disp: 90 tablet, Rfl: 0   Menthol, Topical Analgesic, (BIOFREEZE) 4 % GEL, Apply to affected area daily as needed, Disp: 150 mL, Rfl: 0   metFORMIN (GLUCOPHAGE) 1000 MG tablet, TAKE 1 TABLET BY MOUTH 2 TIMES DAILY WITH A MEAL, Disp: 180 tablet, Rfl: 3   OVER THE COUNTER MEDICATION, Goody Powder for headaches. PRN, Disp: , Rfl:    sitaGLIPtin (JANUVIA) 100 MG tablet, TAKE 1 TABLET BY MOUTH ONCE DAILY IN THE MORNING, Disp: 90 tablet, Rfl: 3   spironolactone (ALDACTONE) 50 MG tablet, TAKE 1 TABLET BY MOUTH ONCE DAILY, Disp: 90 tablet, Rfl: 3   traMADol (ULTRAM) 50 MG tablet, Take 1  tablet (50 mg total) by mouth 4 (four) times daily., Disp: 100 tablet, Rfl: 2   verapamil (VERELAN PM) 240 MG 24 hr capsule, Take 1 capsule (240 mg total) by mouth at bedtime., Disp: 90 capsule, Rfl: 3   Objective:     Vitals:   05/03/21 1342  BP: 120/70  Pulse: 70  SpO2: 99%  Height: 5\' 4"  (1.626 m)      Body mass index is 34.5 kg/m.    Physical Exam:    Gen: Appears well, nad, nontoxic and pleasant Psych: Alert and oriented, appropriate mood and affect Neuro: sensation intact, strength is 5/5 with df/pf/inv/ev, muscle tone wnl Skin:  no susupicious lesions or rashes  Left foot/ankle: no deformity, no swelling or effusion TTP midfoot, navicular, base of first and second metatarsals NTTP over fibular head, lat mal, medial mal, achilles,  , base of 5th, ATFL, CFL, deltoid, calcaneous   ROM DF 30, PF 45, inv/ev intact Negative ant drawer, talar tilt, rotation test, squeeze test. Neg thompson No pain with resisted inversion or eversion    Electronically signed by:  Benito Mccreedy D.[facility] 2:21 PM 05/03/21

## 2021-05-03 NOTE — Patient Instructions (Addendum)
Good to see you  Left foot xray on the way out  Can come out of boot  Use tylenol, NSAIDs as needed for pain  If pain returns call us and we can order an MRI

## 2021-05-08 ENCOUNTER — Encounter: Payer: Self-pay | Admitting: Internal Medicine

## 2021-05-08 ENCOUNTER — Other Ambulatory Visit: Payer: Self-pay

## 2021-05-08 ENCOUNTER — Ambulatory Visit: Payer: 59 | Admitting: Internal Medicine

## 2021-05-08 ENCOUNTER — Other Ambulatory Visit (HOSPITAL_COMMUNITY): Payer: Self-pay

## 2021-05-08 VITALS — BP 138/72 | HR 76 | Ht 64.0 in | Wt 200.0 lb

## 2021-05-08 DIAGNOSIS — E113592 Type 2 diabetes mellitus with proliferative diabetic retinopathy without macular edema, left eye: Secondary | ICD-10-CM

## 2021-05-08 DIAGNOSIS — E785 Hyperlipidemia, unspecified: Secondary | ICD-10-CM | POA: Diagnosis not present

## 2021-05-08 DIAGNOSIS — Z6835 Body mass index (BMI) 35.0-35.9, adult: Secondary | ICD-10-CM | POA: Diagnosis not present

## 2021-05-08 DIAGNOSIS — E1169 Type 2 diabetes mellitus with other specified complication: Secondary | ICD-10-CM

## 2021-05-08 LAB — POCT GLYCOSYLATED HEMOGLOBIN (HGB A1C): Hemoglobin A1C: 6.4 % — AB (ref 4.0–5.6)

## 2021-05-08 MED ORDER — METFORMIN HCL 1000 MG PO TABS
ORAL_TABLET | Freq: Two times a day (BID) | ORAL | 3 refills | Status: DC
  Filled 2021-05-08: qty 180, fill #0
  Filled 2022-01-17: qty 180, 90d supply, fill #0

## 2021-05-08 NOTE — Patient Instructions (Signed)
Please continue: - Metformin 1000 mg 2x a day - Januvia 100 mg daily in am  Please come back for a follow-up appointment in 6 months.

## 2021-05-08 NOTE — Progress Notes (Signed)
Patient ID: Katelyn Peterson, female   DOB: [birth year], 76 y.o.   MRN: [id number]  This visit occurred during the SARS-CoV-2 public health emergency.  Safety protocols were in place, including screening questions prior to the visit, additional usage of staff PPE, and extensive cleaning of exam room while observing appropriate contact time as indicated for disinfecting solutions.   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 Last visit 6 months ago.  Interim history: No increased urination, blurry vision, nausea, chest pain. She had a steroid inj 3-4 weeks ago for L foot pain. Now resolved.  Reviewed HbA1c levels: Lab Results  Component Value Date   HGBA1C 6.6 (H) 12/14/2020   HGBA1C 6.0 (A) 11/03/2020   HGBA1C 6.2 (A) 05/05/2020   Pt is on a regimen of: - Metformin 1000 mg twice a day - Januvia 100 mg daily in am We stopped Amaryl 2 mg bid and Actos 30 mg daily for daily hypoglycemia events and possible SEs, respectively.  Pt checks her sugars once a day: - am: 71, 78, 80-108, 120 >> 62-114, 125, 140 >> 83-115, 120>> 85-112 - 2h after b'fast: n/c >> 131 >> n/c >> 114 >> n/c - before lunch: n/c >> 93 >> 100-110 >> n/c - 2h after lunch: 1n/c>> 110, 121 >> n/c - before dinner: 105, 115 >> n/c >> 102 >> n/c - 2h after dinner: 135, 135 >> n/c - bedtime:   90 >> 70 >> n/c Lowest sugar was 80 >> 88 >> 71 >> 62 >> 83 >> 83;  she has hypoglycemia awareness in the 60s. Highest sugar was 114 >> 110 >> 120 >> 140 >> 114  -No CKD, last BUN/creatinine:  Lab Results  Component Value Date   BUN 21 01/02/2021   CREATININE 1.21 (H) 01/02/2021  On quinapril 40.  Normal ACR: Lab Results  Component Value Date   MICRALBCREAT 1.4 06/08/2020   MICRALBCREAT 2.3 06/09/2019   MICRALBCREAT 0.9 06/10/2018   MICRALBCREAT 1.5 01/07/2017   MICRALBCREAT 0.9 05/08/2016   -+ HL; last  set of lipids: Lab Results  Component Value Date   CHOL 193 12/14/2020   HDL 57.00 12/14/2020   LDLCALC 121 (H) 12/14/2020   LDLDIRECT 146.5 09/18/2012   TRIG 72.0 12/14/2020   CHOLHDL 3 12/14/2020  She had leg cramps with statins: Rosuvastatin, pravastatin, atorvastatin and fluvastatin XL.  She refused to try Zetia.  However, she now does well on Lipitor low-dose every other day (or sometimes 2x a week) and she uses a muscle rub cream for cramps.  - last eye exam was in 08/2020: reportedly + OS PDR, no macular edema; Dr. Edyth Gunnels.  - She denies numbness and tingling in her feet.  She has a history of HTN. She has been considered for lap band surgery >> she did not want to follow through with this. She also has bilateral chronic lower extremity cramps, right knee pain, anemia.  Latest TSH was normal: Lab Results  Component Value Date   TSH 1.89 06/08/2020   ROS: + see HPI  I reviewed pt's medications, allergies, PMH, social hx, family hx, and changes were documented in the history of present illness. Otherwise, unchanged from my initial visit note.  Past Medical History:  Diagnosis Date   Arthritis    HTN (hypertension)    Hyperlipidemia    Obesity    Type II or unspecified type diabetes mellitus  without mention of complication, not stated as uncontrolled    Past Surgical History:  Procedure Laterality Date   ABDOMINAL HYSTERECTOMY     Social History   Socioeconomic History   Marital status: Married    Spouse name: Not on file   Number of children: Not on file   Years of education: Not on file   Highest education level: Not on file  Occupational History   Occupation: [employer]  Tobacco Use   Smoking status: Never   Smokeless tobacco: Never  Substance and Sexual Activity   Alcohol use: No   Drug use: No   Sexual activity: Yes    Partners: Male  Other Topics Concern   Not on file  Social History Narrative   Regular exercise: seldom   Caffeine use:  occasionally         Social Determinants of Health   Financial Resource Strain: Not on file  Food Insecurity: Not on file  Transportation Needs: Not on file  Physical Activity: Not on file  Stress: Not on file  Social Connections: Not on file  Intimate Partner Violence: Not on file   Current Outpatient Medications on File Prior to Visit  Medication Sig Dispense Refill   atorvastatin (LIPITOR) 20 MG tablet TAKE 1 TABLET BY MOUTH ONCE DAILY 90 tablet 3   cetirizine (ZYRTEC) 10 MG tablet TAKE ONE TABLET BY MOUTH ONCE DAILY 70 tablet 5   Cholecalciferol 1000 UNITS tablet Take 1,000 Units by mouth daily.     Diclofenac Sodium 2 % SOLN Apply 1 pump twice daily. 112 g 3   ibuprofen (ADVIL,MOTRIN) 600 MG tablet TAKE 1 TABLET BY MOUTH TWICE DAILY AFTER MEALS FOR 1 WEEK, THEN AS NEEDED FOR PAIN 60 tablet 2   lisinopril (ZESTRIL) 40 MG tablet Take 1 tablet (40 mg total) by mouth daily. 90 tablet 3   loratadine (CLARITIN) 10 MG tablet Take 1 tablet (10 mg total) by mouth daily. 90 tablet 0   Menthol, Topical Analgesic, (BIOFREEZE) 4 % GEL Apply to affected area daily as needed 150 mL 0   metFORMIN (GLUCOPHAGE) 1000 MG tablet TAKE 1 TABLET BY MOUTH 2 TIMES DAILY WITH A MEAL 180 tablet 3   OVER THE COUNTER MEDICATION Goody Powder for headaches. PRN     sitaGLIPtin (JANUVIA) 100 MG tablet TAKE 1 TABLET BY MOUTH ONCE DAILY IN THE MORNING 90 tablet 3   spironolactone (ALDACTONE) 50 MG tablet TAKE 1 TABLET BY MOUTH ONCE DAILY 90 tablet 3   traMADol (ULTRAM) 50 MG tablet Take 1 tablet (50 mg total) by mouth 4 (four) times daily. 100 tablet 2   verapamil (VERELAN PM) 240 MG 24 hr capsule Take 1 capsule (240 mg total) by mouth at bedtime. 90 capsule 3   No current facility-administered medications on file prior to visit.   Allergies  Allergen Reactions   Atorvastatin     REACTION: cramps   Hydrochlorothiazide     REACTION: Cramps   Pravastatin     cramps   Family History  Problem Relation Age  of Onset   Hypertension Mother    Diabetes Mother    Hypertension Father    Hypertension Other    Colon cancer Neg Hx    Esophageal cancer Neg Hx    Rectal cancer Neg Hx    Stomach cancer Neg Hx    PE: BP 138/72 (BP Location: Right Arm, Patient Position: Sitting, Cuff Size: Normal)    Pulse 76    Ht 5'  4" (1.626 m)    Wt 200 lb (90.7 kg)    SpO2 96%    BMI 34.33 kg/m    Wt Readings from Last 3 Encounters:  05/08/21 200 lb (90.7 kg)  04/10/21 201 lb (91.2 kg)  12/14/20 197 lb 3.2 oz (89.4 kg)   Constitutional: overweight, in NAD Eyes: PERRLA, EOMI, no exophthalmos ENT: moist mucous membranes, no thyromegaly, no cervical lymphadenopathy Cardiovascular: RRR, No MRG, + mild peri ankle swelling bilaterally, pitting Respiratory: CTA B Musculoskeletal: no deformities, strength intact in all 4 Skin: moist, warm, no rashes Neurological: no tremor with outstretched hands, DTR normal in all 4 Diabetic Foot Exam - Simple   Simple Foot Form Diabetic Foot exam was performed with the following findings: Yes 05/08/2021  9:05 AM  Visual Inspection No deformities, no ulcerations, no other skin breakdown bilaterally: Yes Sensation Testing Intact to touch and monofilament testing bilaterally: Yes Pulse Check Posterior Tibialis and Dorsalis pulse intact bilaterally: Yes Comments Discolored toenail - right hallux   ASSESSMENT: 1. 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 Hyperlipidemia - Developed leg cramps with pravastatin, atorvastatin, Crestor, fluvastatin  3.  Obesity class II  4. Goiter by palpation 11/04/2012: Thyroid U/S: Right thyroid lobe: 4.3 x 1.0 x 1.8 cm  Left thyroid lobe: 4.6 x 1.2 x 1.9 cm  Isthmus: 5 mm  Focal nodules: Thyroid echotexture is homogeneous, without solid or cystic lesion.  Lymphadenopathy: None visualized.  IMPRESSION:  Normal thyroid ultrasound. No evidence of thyromegaly or dominant solid/cystic mass.  -No further  intervention needed for this  PLAN:  1. DM2 -Patient with longstanding, well-controlled, type 2 diabetes, on oral antidiabetic regimen with metformin and DPP 4 inhibitor, with good control.  She only checks sugars in the morning despite repeated advised to also check later during the day.  HbA1c at last visit was excellent, at 6.0%.  She had another HbA1c obtained 4 months ago by PCP and this was higher, at 6.6%. -At today's visit, she is still only checking blood sugars in the morning and they are excellent, mostly in the 90s.  No lows or hyperglycemic spikes.  We again discussed about the need to check later in the day and I advised her for now to only try to check before lunch or after dinner and during the weekend.  I will not change her regimen for now.- I suggested to:  Patient Instructions  Please continue: - Metformin 1000 mg 2x a day - Januvia 100 mg daily in am  Please come back for a follow-up appointment in 6 months.  - we checked her HbA1c: 6.4% (lower) - advised to check sugars at different times of the day - 1x a day, rotating check times - advised for yearly eye exams >> she is UTD - foot exam performed today - return to clinic in 6 months  2. Hyperlipidemia -Reviewed latest lipid panel from 12/2020: LDL above our goal of less than 70, the rest of the fractions at goal: Lab Results  Component Value Date   CHOL 193 12/14/2020   HDL 57.00 12/14/2020   LDLCALC 121 (H) 12/14/2020   LDLDIRECT 146.5 09/18/2012   TRIG 72.0 12/14/2020   CHOLHDL 3 12/14/2020  -She had leg cramps from Crestor low-dose, pravastatin, atorvastatin, fluvastatin XL. We did discuss in the past about possibly using co-Q10 and magnesium to help with muscle aches.  She is able to tolerate Lipitor 10 mg every 2 to 3 days  3.  Obesity class II -  We will continue metformin and Januvia which are both weight neutral - lost 2 lbs since last OV  Philemon Kingdom, MD PhD [facility]

## 2021-05-31 ENCOUNTER — Other Ambulatory Visit (HOSPITAL_COMMUNITY): Payer: Self-pay

## 2021-06-15 ENCOUNTER — Encounter: Payer: Self-pay | Admitting: Internal Medicine

## 2021-06-15 ENCOUNTER — Ambulatory Visit (INDEPENDENT_AMBULATORY_CARE_PROVIDER_SITE_OTHER): Payer: 59 | Admitting: Internal Medicine

## 2021-06-15 VITALS — BP 132/80 | HR 72 | Temp 98.0°F | Ht 64.0 in | Wt 198.0 lb

## 2021-06-15 DIAGNOSIS — Z0000 Encounter for general adult medical examination without abnormal findings: Secondary | ICD-10-CM | POA: Diagnosis not present

## 2021-06-15 DIAGNOSIS — N183 Chronic kidney disease, stage 3 unspecified: Secondary | ICD-10-CM | POA: Diagnosis not present

## 2021-06-15 DIAGNOSIS — I10 Essential (primary) hypertension: Secondary | ICD-10-CM

## 2021-06-15 DIAGNOSIS — E113592 Type 2 diabetes mellitus with proliferative diabetic retinopathy without macular edema, left eye: Secondary | ICD-10-CM | POA: Diagnosis not present

## 2021-06-15 DIAGNOSIS — E1169 Type 2 diabetes mellitus with other specified complication: Secondary | ICD-10-CM | POA: Diagnosis not present

## 2021-06-15 DIAGNOSIS — E785 Hyperlipidemia, unspecified: Secondary | ICD-10-CM

## 2021-06-15 DIAGNOSIS — Z6835 Body mass index (BMI) 35.0-35.9, adult: Secondary | ICD-10-CM | POA: Diagnosis not present

## 2021-06-15 DIAGNOSIS — Z23 Encounter for immunization: Secondary | ICD-10-CM

## 2021-06-15 DIAGNOSIS — M25561 Pain in right knee: Secondary | ICD-10-CM | POA: Diagnosis not present

## 2021-06-15 LAB — CBC WITH DIFFERENTIAL/PLATELET
Basophils Absolute: 0 10*3/uL (ref 0.0–0.1)
Basophils Relative: 0.5 % (ref 0.0–3.0)
Eosinophils Absolute: 0.1 10*3/uL (ref 0.0–0.7)
Eosinophils Relative: 1.1 % (ref 0.0–5.0)
HCT: 30.3 % — ABNORMAL LOW (ref 36.0–46.0)
Hemoglobin: 10 g/dL — ABNORMAL LOW (ref 12.0–15.0)
Lymphocytes Relative: 35.5 % (ref 12.0–46.0)
Lymphs Abs: 2.1 10*3/uL (ref 0.7–4.0)
MCHC: 33 g/dL (ref 30.0–36.0)
MCV: 89.3 fl (ref 78.0–100.0)
Monocytes Absolute: 0.4 10*3/uL (ref 0.1–1.0)
Monocytes Relative: 6.4 % (ref 3.0–12.0)
Neutro Abs: 3.3 10*3/uL (ref 1.4–7.7)
Neutrophils Relative %: 56.5 % (ref 43.0–77.0)
Platelets: 254 10*3/uL (ref 150.0–400.0)
RBC: 3.4 Mil/uL — ABNORMAL LOW (ref 3.87–5.11)
RDW: 14.1 % (ref 11.5–15.5)
WBC: 5.8 10*3/uL (ref 4.0–10.5)

## 2021-06-15 LAB — URINALYSIS, ROUTINE W REFLEX MICROSCOPIC
Bilirubin Urine: NEGATIVE
Hgb urine dipstick: NEGATIVE
Ketones, ur: NEGATIVE
Nitrite: NEGATIVE
Specific Gravity, Urine: 1.01 (ref 1.000–1.030)
Total Protein, Urine: NEGATIVE
Urine Glucose: NEGATIVE
Urobilinogen, UA: 1 (ref 0.0–1.0)
pH: 7 (ref 5.0–8.0)

## 2021-06-15 LAB — COMPREHENSIVE METABOLIC PANEL
ALT: 12 U/L (ref 0–35)
AST: 14 U/L (ref 0–37)
Albumin: 4 g/dL (ref 3.5–5.2)
Alkaline Phosphatase: 79 U/L (ref 39–117)
BUN: 21 mg/dL (ref 6–23)
CO2: 27 mEq/L (ref 19–32)
Calcium: 9.4 mg/dL (ref 8.4–10.5)
Chloride: 108 mEq/L (ref 96–112)
Creatinine, Ser: 1.3 mg/dL — ABNORMAL HIGH (ref 0.40–1.20)
GFR: 40.3 mL/min — ABNORMAL LOW (ref >60.00)
Glucose, Bld: 102 mg/dL — ABNORMAL HIGH (ref 70–99)
Potassium: 4.7 mEq/L (ref 3.5–5.1)
Sodium: 141 mEq/L (ref 135–145)
Total Bilirubin: 0.3 mg/dL (ref 0.2–1.2)
Total Protein: 7.1 g/dL (ref 6.0–8.3)

## 2021-06-15 LAB — LIPID PANEL
Cholesterol: 218 mg/dL — ABNORMAL HIGH (ref 0–200)
HDL: 60.3 mg/dL (ref >39.00)
LDL Cholesterol: 146 mg/dL — ABNORMAL HIGH (ref 0–99)
NonHDL: 157.89
Total CHOL/HDL Ratio: 4
Triglycerides: 61 mg/dL (ref 0.0–149.0)
VLDL: 12.2 mg/dL (ref 0.0–40.0)

## 2021-06-15 LAB — TSH: TSH: 1.35 u[IU]/mL (ref 0.35–5.50)

## 2021-06-15 NOTE — Assessment & Plan Note (Signed)
Doing well on diet ?

## 2021-06-15 NOTE — Assessment & Plan Note (Signed)
Check A1c ?F/u w/Dr Cruzita Lederer ?Cont on Metformin, Januvia,  Lipitor ?

## 2021-06-15 NOTE — Assessment & Plan Note (Addendum)
GFR 43-44  I would like to refer her to see a nephrologist ?Renal US - w/cortex thinning ?Hydrate better ?Nephrology ref - pt declined ?

## 2021-06-15 NOTE — Addendum Note (Signed)
Addended by: Marijean Heath R on: 06/15/2021 04:16 PM ? ? Modules accepted: Orders ? ?

## 2021-06-15 NOTE — Progress Notes (Signed)
? ?Subjective:  ?Patient ID: Katelyn Peterson, female    DOB: 22-May-1945  Age: 76 y.o. MRN: [id number] ? ?CC: No chief complaint on file. ? ? ?HPI ?Katelyn Peterson presents for a wellexam ? ?Outpatient Medications Prior to Visit  ?Medication Sig Dispense Refill  ? atorvastatin (LIPITOR) 20 MG tablet TAKE 1 TABLET BY MOUTH ONCE DAILY 90 tablet 3  ? cetirizine (ZYRTEC) 10 MG tablet TAKE ONE TABLET BY MOUTH ONCE DAILY 70 tablet 5  ? Cholecalciferol 1000 UNITS tablet Take 1,000 Units by mouth daily.    ? Diclofenac Sodium 2 % SOLN Apply 1 pump twice daily. 112 g 3  ? ibuprofen (ADVIL,MOTRIN) 600 MG tablet TAKE 1 TABLET BY MOUTH TWICE DAILY AFTER MEALS FOR 1 WEEK, THEN AS NEEDED FOR PAIN 60 tablet 2  ? lisinopril (ZESTRIL) 40 MG tablet Take 1 tablet (40 mg total) by mouth daily. 90 tablet 3  ? loratadine (CLARITIN) 10 MG tablet Take 1 tablet (10 mg total) by mouth daily. 90 tablet 0  ? Menthol, Topical Analgesic, (BIOFREEZE) 4 % GEL Apply to affected area daily as needed 150 mL 0  ? metFORMIN (GLUCOPHAGE) 1000 MG tablet TAKE 1 TABLET BY MOUTH 2 TIMES DAILY WITH A MEAL 180 tablet 3  ? OVER THE COUNTER MEDICATION Goody Powder for headaches. PRN    ? sitaGLIPtin (JANUVIA) 100 MG tablet TAKE 1 TABLET BY MOUTH ONCE DAILY IN THE MORNING 90 tablet 3  ? spironolactone (ALDACTONE) 50 MG tablet TAKE 1 TABLET BY MOUTH ONCE DAILY 90 tablet 3  ? traMADol (ULTRAM) 50 MG tablet Take 1 tablet (50 mg total) by mouth 4 (four) times daily. 100 tablet 2  ? verapamil (VERELAN PM) 240 MG 24 hr capsule Take 1 capsule (240 mg total) by mouth at bedtime. 90 capsule 3  ? ?No facility-administered medications prior to visit.  ? ? ?ROS: ?Review of Systems ? ?Objective:  ?BP 132/80 (BP Location: Left Arm, Patient Position: Sitting, Cuff Size: Large)   Pulse 72   Temp 98 ?F (36.7 ?C) (Oral)   Ht '5\' 4"'$  (1.626 m)   Wt 198 lb (89.8 kg)   SpO2 98%   BMI 33.99 kg/m?  ? ?BP Readings from Last 3 Encounters:  ?06/15/21 132/80  ?05/08/21 138/72   ?05/03/21 120/70  ? ? ?Wt Readings from Last 3 Encounters:  ?06/15/21 198 lb (89.8 kg)  ?05/08/21 200 lb (90.7 kg)  ?04/10/21 201 lb (91.2 kg)  ? ? ?Physical Exam ? ?Lab Results  ?Component Value Date  ? WBC 6.1 12/14/2020  ? HGB 11.1 (L) 12/14/2020  ? HCT 34.2 (L) 12/14/2020  ? PLT 268.0 12/14/2020  ? GLUCOSE 116 (H) 01/02/2021  ? CHOL 193 12/14/2020  ? TRIG 72.0 12/14/2020  ? HDL 57.00 12/14/2020  ? LDLDIRECT 146.5 09/18/2012  ? LDLCALC 121 (H) 12/14/2020  ? ALT 12 01/02/2021  ? AST 11 01/02/2021  ? NA 139 01/02/2021  ? K 4.5 01/02/2021  ? CL 106 01/02/2021  ? CREATININE 1.21 (H) 01/02/2021  ? BUN 21 01/02/2021  ? CO2 25 01/02/2021  ? TSH 1.89 06/08/2020  ? HGBA1C 6.4 (A) 05/08/2021  ? MICROALBUR 1.8 06/08/2020  ? ? ?US RENAL ? ?Result Date: 01/05/2021 ?CLINICAL DATA:  Chronic kidney disease. EXAM: RENAL / URINARY TRACT ULTRASOUND COMPLETE COMPARISON:  None. FINDINGS: Right Kidney: Renal measurements: 9.4 x 4.8 x 6.0 cm = volume: 140 mL. Mild cortical thinning. Echogenicity within normal limits. No mass or hydronephrosis visualized. Left Kidney: Renal measurements:  10.3 x 5.6 x 6.8 cm = volume: 205 mL. Echogenicity within normal limits. No mass or hydronephrosis visualized. Bladder: Appears normal for degree of bladder distention. Other: None. IMPRESSION: 1. Mild right renal cortical thinning. Electronically Signed   By: Titus Dubin M.D.   On: 01/05/2021 10:26  ? ? ?Assessment & Plan:  ? ?Problem List Items Addressed This Visit   ? ? Dyslipidemia associated with type 2 diabetes mellitus ([place])  ?  Chronic ?Cont on Lipitor ?  ?  ? Obesity  ?  Doing well on diet ?  ?  ? Essential hypertension  ? KNEE PAIN  ?  Blue-Emu cream was recommended to use 2-3 times a day ? ?  ?  ? Well adult exam - Primary  ?  We discussed age appropriate health related issues, including available/recomended screening tests and vaccinations. We discussed a need for adhering to healthy diet and exercise. Labs were ordered to be later  reviewed . All questions were answered. ?Colon 08/2016 ?A cardiac CT scan for calcium scoring offered in the past ?  ?  ? Relevant Orders  ? TSH  ? CBC with Differential/Platelet  ? Urinalysis  ? Lipid panel  ? Comprehensive metabolic panel  ? CRI (chronic renal insufficiency), stage 3 (moderate) (HCC)  ?  GFR 43-44  I would like to refer her to see a nephrologist ?Renal US - w/cortex thinning ?Hydrate better ?Nephrology ref - pt declined ?  ?  ? 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  Check A1c ?F/u w/Dr Cruzita Lederer ?Cont on Metformin, Januvia,  Lipitor ?  ?  ?  ? ? ?No orders of the defined types were placed in this encounter. ?  ? ? ?Follow-up: No follow-ups on file. ? ?Walker Kehr, MD ?

## 2021-06-15 NOTE — Patient Instructions (Signed)
Blue-Emu cream -- use 2-3 times a day ? ?

## 2021-06-15 NOTE — Assessment & Plan Note (Signed)
Chronic. Cont on Lipitor 

## 2021-06-15 NOTE — Assessment & Plan Note (Signed)
We discussed age appropriate health related issues, including available/recomended screening tests and vaccinations. We discussed a need for adhering to healthy diet and exercise. Labs were ordered to be later reviewed . All questions were answered. ?Colon 08/2016 ?A cardiac CT scan for calcium scoring offered in the past ?

## 2021-06-15 NOTE — Assessment & Plan Note (Signed)
Blue-Emu cream was recommended to use 2-3 times a day ? ?

## 2021-07-03 ENCOUNTER — Other Ambulatory Visit (HOSPITAL_COMMUNITY): Payer: Self-pay

## 2021-07-27 ENCOUNTER — Encounter: Payer: Self-pay | Admitting: Gastroenterology

## 2021-08-01 ENCOUNTER — Other Ambulatory Visit (HOSPITAL_COMMUNITY): Payer: Self-pay

## 2021-08-29 ENCOUNTER — Other Ambulatory Visit (HOSPITAL_COMMUNITY): Payer: Self-pay

## 2021-09-04 DIAGNOSIS — H5203 Hypermetropia, bilateral: Secondary | ICD-10-CM | POA: Diagnosis not present

## 2021-09-04 DIAGNOSIS — H2513 Age-related nuclear cataract, bilateral: Secondary | ICD-10-CM | POA: Diagnosis not present

## 2021-09-04 DIAGNOSIS — Z7984 Long term (current) use of oral hypoglycemic drugs: Secondary | ICD-10-CM | POA: Diagnosis not present

## 2021-09-04 DIAGNOSIS — H25013 Cortical age-related cataract, bilateral: Secondary | ICD-10-CM | POA: Diagnosis not present

## 2021-09-04 DIAGNOSIS — E113293 Type 2 diabetes mellitus with mild nonproliferative diabetic retinopathy without macular edema, bilateral: Secondary | ICD-10-CM | POA: Diagnosis not present

## 2021-09-04 DIAGNOSIS — H524 Presbyopia: Secondary | ICD-10-CM | POA: Diagnosis not present

## 2021-09-04 DIAGNOSIS — H52203 Unspecified astigmatism, bilateral: Secondary | ICD-10-CM | POA: Diagnosis not present

## 2021-09-04 DIAGNOSIS — H25043 Posterior subcapsular polar age-related cataract, bilateral: Secondary | ICD-10-CM | POA: Diagnosis not present

## 2021-09-04 DIAGNOSIS — E119 Type 2 diabetes mellitus without complications: Secondary | ICD-10-CM | POA: Diagnosis not present

## 2021-09-06 ENCOUNTER — Other Ambulatory Visit (HOSPITAL_COMMUNITY): Payer: Self-pay

## 2021-09-07 ENCOUNTER — Other Ambulatory Visit (HOSPITAL_COMMUNITY): Payer: Self-pay

## 2021-10-10 ENCOUNTER — Other Ambulatory Visit (HOSPITAL_COMMUNITY): Payer: Self-pay

## 2021-11-06 ENCOUNTER — Ambulatory Visit (INDEPENDENT_AMBULATORY_CARE_PROVIDER_SITE_OTHER): Payer: 59 | Admitting: *Deleted

## 2021-11-06 DIAGNOSIS — Z23 Encounter for immunization: Secondary | ICD-10-CM

## 2021-11-07 ENCOUNTER — Other Ambulatory Visit (HOSPITAL_COMMUNITY): Payer: Self-pay

## 2021-11-07 ENCOUNTER — Encounter: Payer: Self-pay | Admitting: Internal Medicine

## 2021-11-07 ENCOUNTER — Ambulatory Visit (INDEPENDENT_AMBULATORY_CARE_PROVIDER_SITE_OTHER): Payer: 59 | Admitting: Internal Medicine

## 2021-11-07 VITALS — BP 130/78 | HR 75 | Ht 64.0 in | Wt 200.0 lb

## 2021-11-07 DIAGNOSIS — E1169 Type 2 diabetes mellitus with other specified complication: Secondary | ICD-10-CM | POA: Diagnosis not present

## 2021-11-07 DIAGNOSIS — E113592 Type 2 diabetes mellitus with proliferative diabetic retinopathy without macular edema, left eye: Secondary | ICD-10-CM

## 2021-11-07 DIAGNOSIS — E785 Hyperlipidemia, unspecified: Secondary | ICD-10-CM

## 2021-11-07 DIAGNOSIS — Z1231 Encounter for screening mammogram for malignant neoplasm of breast: Secondary | ICD-10-CM | POA: Diagnosis not present

## 2021-11-07 DIAGNOSIS — Z6835 Body mass index (BMI) 35.0-35.9, adult: Secondary | ICD-10-CM

## 2021-11-07 LAB — POCT GLYCOSYLATED HEMOGLOBIN (HGB A1C): Hemoglobin A1C: 5.9 % — AB (ref 4.0–5.6)

## 2021-11-07 MED ORDER — OZEMPIC (0.25 OR 0.5 MG/DOSE) 2 MG/3ML ~~LOC~~ SOPN
0.5000 mg | PEN_INJECTOR | SUBCUTANEOUS | 5 refills | Status: DC
  Filled 2021-11-07: qty 3, 28d supply, fill #0

## 2021-11-07 NOTE — Progress Notes (Signed)
Patient ID: Katelyn Peterson, female   DOB: 1945-11-27, 76 y.o.   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 Last visit 6 months ago.  Interim history: No increased urination, blurry vision, nausea, chest pain. She changed diet >> cut out sweets, more veggies. At this OV, she inquires about Ozempic. She developed a white patch on her left foot after soaking the foot in Epsom salt and lard to relieve foot pain. The foot pain disappeared.  Reviewed HbA1c levels: Lab Results  Component Value Date   HGBA1C 6.4 (A) 05/08/2021   HGBA1C 6.6 (H) 12/14/2020   HGBA1C 6.0 (A) 11/03/2020   Pt is on a regimen of: - Metformin 1000 mg twice a day - Januvia 100 mg daily in am We stopped Amaryl 2 mg bid and Actos 30 mg daily for daily hypoglycemia events and possible SEs, respectively.  Pt checks her sugars once a day: - am:  62-114, 125, 140 >> 83-115, 120 >> 85-112 >> 83-111, 120 - 2h after b'fast: n/c >> 131 >> n/c >> 114 >> n/c - before lunch: n/c >> 93 >> 100-110 >> n/c - 2h after lunch: 1n/c>> 110, 121 >> n/c - before dinner: 105, 115 >> n/c >> 102 >> n/c - 2h after dinner: 135, 135 >> n/c - bedtime:   90 >> 70 >> n/c Lowest sugar was 62 >> 83 >> 83;  she has hypoglycemia awareness in the 60s. Highest sugar was 140 >> 114 >> 120.  -No CKD, last BUN/creatinine:  Lab Results  Component Value Date   BUN 21 06/15/2021   CREATININE 1.30 (H) 06/15/2021  On quinapril 40.  Normal ACR: Lab Results  Component Value Date   MICRALBCREAT 1.4 06/08/2020   MICRALBCREAT 2.3 06/09/2019   MICRALBCREAT 0.9 06/10/2018   MICRALBCREAT 1.5 01/07/2017   MICRALBCREAT 0.9 05/08/2016   -+ HL; last set of lipids: Lab Results  Component Value Date   CHOL 218 (H) 06/15/2021   HDL 60.30 06/15/2021   LDLCALC 146 (H) 06/15/2021   LDLDIRECT 146.5 09/18/2012   TRIG 61.0  06/15/2021   CHOLHDL 4 06/15/2021  She had leg cramps with statins: Rosuvastatin, pravastatin, atorvastatin and fluvastatin XL.  She refused to try Zetia.  However, she now does well on Lipitor low-dose every other day (or sometimes 2x a week) and she uses a muscle rub cream for cramps.  - last eye exam was in 08/2021: reportedly + OS PDR, no macular edema; Dr. Edyth Gunnels.  - She denies numbness and tingling in her feet.  Last foot exam 05/08/2021 -here in the office  She has a history of HTN. She has been considered for lap band surgery >> she did not want to follow through with this. She also has bilateral chronic lower extremity cramps, right knee pain, anemia.  Latest TSH was normal: Lab Results  Component Value Date   TSH 1.35 06/15/2021   ROS: + see HPI  I reviewed pt's medications, allergies, PMH, social hx, family hx, and changes were documented in the history of present illness. Otherwise, unchanged from my initial visit note.  Past Medical History:  Diagnosis Date   Arthritis    HTN (hypertension)    Hyperlipidemia    Obesity    Type II or unspecified type diabetes mellitus without mention of complication, not stated as uncontrolled    Past Surgical History:  Procedure Laterality  Date   ABDOMINAL HYSTERECTOMY     Social History   Socioeconomic History   Marital status: Married    Spouse name: Not on file   Number of children: Not on file   Years of education: Not on file   Highest education level: Not on file  Occupational History   Occupation: [employer]  Tobacco Use   Smoking status: Never   Smokeless tobacco: Never  Substance and Sexual Activity   Alcohol use: No   Drug use: No   Sexual activity: Yes    Partners: Male  Other Topics Concern   Not on file  Social History Narrative   Regular exercise: seldom   Caffeine use: occasionally         Social Determinants of Health   Financial Resource Strain: Not on file  Food Insecurity: Not on  file  Transportation Needs: Not on file  Physical Activity: Not on file  Stress: Not on file  Social Connections: Not on file  Intimate Partner Violence: Not on file   Current Outpatient Medications on File Prior to Visit  Medication Sig Dispense Refill   atorvastatin (LIPITOR) 20 MG tablet TAKE 1 TABLET BY MOUTH ONCE DAILY 90 tablet 3   cetirizine (ZYRTEC) 10 MG tablet TAKE ONE TABLET BY MOUTH ONCE DAILY 70 tablet 5   Cholecalciferol 1000 UNITS tablet Take 1,000 Units by mouth daily.     Diclofenac Sodium 2 % SOLN Apply 1 pump twice daily. 112 g 3   ibuprofen (ADVIL,MOTRIN) 600 MG tablet TAKE 1 TABLET BY MOUTH TWICE DAILY AFTER MEALS FOR 1 WEEK, THEN AS NEEDED FOR PAIN 60 tablet 2   lisinopril (ZESTRIL) 40 MG tablet Take 1 tablet (40 mg total) by mouth daily. 90 tablet 3   loratadine (CLARITIN) 10 MG tablet Take 1 tablet (10 mg total) by mouth daily. 90 tablet 0   Menthol, Topical Analgesic, (BIOFREEZE) 4 % GEL Apply to affected area daily as needed 150 mL 0   metFORMIN (GLUCOPHAGE) 1000 MG tablet TAKE 1 TABLET BY MOUTH 2 TIMES DAILY WITH A MEAL 180 tablet 3   OVER THE COUNTER MEDICATION Goody Powder for headaches. PRN     sitaGLIPtin (JANUVIA) 100 MG tablet TAKE 1 TABLET BY MOUTH ONCE DAILY IN THE MORNING 90 tablet 3   spironolactone (ALDACTONE) 50 MG tablet TAKE 1 TABLET BY MOUTH ONCE DAILY 90 tablet 3   traMADol (ULTRAM) 50 MG tablet Take 1 tablet (50 mg total) by mouth 4 (four) times daily. 100 tablet 2   verapamil (VERELAN PM) 240 MG 24 hr capsule Take 1 capsule (240 mg total) by mouth at bedtime. 90 capsule 3   No current facility-administered medications on file prior to visit.   Allergies  Allergen Reactions   Atorvastatin     REACTION: cramps   Hydrochlorothiazide     REACTION: Cramps   Pravastatin     cramps   Family History  Problem Relation Age of Onset   Hypertension Mother    Diabetes Mother    Hypertension Father    Hypertension Other    Colon cancer Neg Hx     Esophageal cancer Neg Hx    Rectal cancer Neg Hx    Stomach cancer Neg Hx    PE: BP 130/78 (BP Location: Right Arm, Patient Position: Sitting, Cuff Size: Normal)   Pulse 75   Ht '5\' 4"'$  (1.626 m)   Wt 200 lb (90.7 kg)   SpO2 95%   BMI 34.33 kg/m  Wt Readings from Last 3 Encounters:  11/07/21 200 lb (90.7 kg)  06/15/21 198 lb (89.8 kg)  05/08/21 200 lb (90.7 kg)   Constitutional: overweight, in NAD Eyes: EOMI, no exophthalmos ENT: moist mucous membranes, no thyromegaly, no cervical lymphadenopathy Cardiovascular: RRR, No MRG, + mild peri ankle swelling bilaterally, pitting Respiratory: CTA B Musculoskeletal: no deformities Skin: moist, warm, no rashes except discolored ~5 cm patch on dorsum of left foot -not painful on palpation Neurological: no tremor with outstretched hands   ASSESSMENT: 1. 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 Hyperlipidemia - Developed leg cramps with pravastatin, atorvastatin, Crestor, fluvastatin  3.  Obesity class II  4. Goiter by palpation 11/04/2012: Thyroid U/S: Right thyroid lobe: 4.3 x 1.0 x 1.8 cm  Left thyroid lobe: 4.6 x 1.2 x 1.9 cm  Isthmus: 5 mm  Focal nodules: Thyroid echotexture is homogeneous, without solid or cystic lesion.  Lymphadenopathy: None visualized.  IMPRESSION:  Normal thyroid ultrasound. No evidence of thyromegaly or dominant solid/cystic mass.  -No further intervention needed for this  PLAN:  1. DM2 -Patient with longstanding, well-controlled, type 2 diabetes, on oral antidiabetic regimen with metformin and DPP 4 inhibitor, with persistently good control.  Latest HbA1c was lower, at 6.4%.  We did not change the regimen at that time as she had excellent blood sugars, mostly in the 90s.  However, we discussed multiple times about not only checking blood sugars in the morning but also later in the day.  -At today's visit, she is still checking blood sugars only in the morning. -She is  interested in starting Ozempic.  We discussed about benefits and possible side effects.  We will start at a low dose.  She can continue on this low dose if she starts losing weight and her blood sugars are controlled (strongly advised her to check later in the day, also), but we could increase the dose to 0.5 mg weekly if needed.  I demonstrated pen use. - I suggested to:  Patient Instructions  Please continue: - Metformin 1000 mg 2x a day  Stop: - Januvia   Start: - Ozempic 0.25 mg weekly in a.m. (for example on Sunday morning) x 4 weeks, then may increase to 0.5 mg weekly in a.m. if no nausea or hypoglycemia.  Please come back for a follow-up appointment in 6 months.  - we checked her HbA1c: 5.9% (lower) - advised to check sugars at different times of the day - 1x a day, rotating check times - advised for yearly eye exams >> she is UTD - regarding the discolored patch of skin on her foot, this could be vitiligo (but she does not have any whitish discoloration anywhere else on her body) or some type of painless chemical burn.  I advised her to just monitor it for now. - return to clinic in 6 months  2. Hyperlipidemia -We will review latest lipid panel from 4 months ago: LDL above target, the rest the fractions at goal: Lab Results  Component Value Date   CHOL 218 (H) 06/15/2021   HDL 60.30 06/15/2021   LDLCALC 146 (H) 06/15/2021   LDLDIRECT 146.5 09/18/2012   TRIG 61.0 06/15/2021   CHOLHDL 4 06/15/2021  -She had leg cramps from low-dose Crestor, pravastatin, atorvastatin, fluvastatin XL.  She is now able to tolerate Lipitor 10 mg every 2 to 3 days.  3.  Obesity class II -We will continue metformin and Januvia, which are both weight neutral - lost 2 lbs before  last visit -Weight stable at this visit  Philemon Kingdom, MD PhD [facility] Endocrin[facility]

## 2021-11-07 NOTE — Patient Instructions (Addendum)
Please continue: - Metformin 1000 mg 2x a day  Stop: - Januvia   Start: - Ozempic 0.25 mg weekly in a.m. (for example on Sunday morning) x 4 weeks, then may increase to 0.5 mg weekly in a.m. if no nausea or hypoglycemia.  Please come back for a follow-up appointment in 6 months.

## 2021-11-08 ENCOUNTER — Other Ambulatory Visit (HOSPITAL_COMMUNITY): Payer: Self-pay

## 2021-11-09 ENCOUNTER — Other Ambulatory Visit (HOSPITAL_COMMUNITY): Payer: Self-pay

## 2021-11-09 ENCOUNTER — Other Ambulatory Visit: Payer: Self-pay

## 2021-11-09 MED ORDER — OZEMPIC (0.25 OR 0.5 MG/DOSE) 2 MG/3ML ~~LOC~~ SOPN
0.5000 mg | PEN_INJECTOR | SUBCUTANEOUS | 3 refills | Status: DC
  Filled 2021-11-10: qty 9, 84d supply, fill #0
  Filled 2022-02-26: qty 3, 28d supply, fill #1
  Filled 2022-03-28: qty 9, 84d supply, fill #2
  Filled 2022-06-14: qty 9, 84d supply, fill #3
  Filled 2022-09-11: qty 6, 56d supply, fill #4

## 2021-11-10 ENCOUNTER — Other Ambulatory Visit (HOSPITAL_COMMUNITY): Payer: Self-pay

## 2021-11-14 ENCOUNTER — Other Ambulatory Visit (HOSPITAL_COMMUNITY): Payer: Self-pay

## 2021-11-17 DIAGNOSIS — M1711 Unilateral primary osteoarthritis, right knee: Secondary | ICD-10-CM | POA: Diagnosis not present

## 2021-12-18 ENCOUNTER — Encounter: Payer: Self-pay | Admitting: Internal Medicine

## 2021-12-18 ENCOUNTER — Ambulatory Visit: Payer: 59 | Admitting: Internal Medicine

## 2021-12-18 DIAGNOSIS — M25561 Pain in right knee: Secondary | ICD-10-CM

## 2021-12-18 DIAGNOSIS — I10 Essential (primary) hypertension: Secondary | ICD-10-CM | POA: Diagnosis not present

## 2021-12-18 DIAGNOSIS — E66812 Obesity, class 2: Secondary | ICD-10-CM

## 2021-12-18 DIAGNOSIS — Z6835 Body mass index (BMI) 35.0-35.9, adult: Secondary | ICD-10-CM | POA: Diagnosis not present

## 2021-12-18 DIAGNOSIS — N183 Chronic kidney disease, stage 3 unspecified: Secondary | ICD-10-CM

## 2021-12-18 LAB — COMPREHENSIVE METABOLIC PANEL
ALT: 10 U/L (ref 0–35)
AST: 12 U/L (ref 0–37)
Albumin: 3.9 g/dL (ref 3.5–5.2)
Alkaline Phosphatase: 70 U/L (ref 39–117)
BUN: 24 mg/dL — ABNORMAL HIGH (ref 6–23)
CO2: 24 mEq/L (ref 19–32)
Calcium: 9.6 mg/dL (ref 8.4–10.5)
Chloride: 106 mEq/L (ref 96–112)
Creatinine, Ser: 1.59 mg/dL — ABNORMAL HIGH (ref 0.40–1.20)
GFR: 31.53 mL/min — ABNORMAL LOW (ref >60.00)
Glucose, Bld: 97 mg/dL (ref 70–99)
Potassium: 4.8 mEq/L (ref 3.5–5.1)
Sodium: 140 mEq/L (ref 135–145)
Total Bilirubin: 0.3 mg/dL (ref 0.2–1.2)
Total Protein: 7.3 g/dL (ref 6.0–8.3)

## 2021-12-18 LAB — CBC WITH DIFFERENTIAL/PLATELET
Basophils Absolute: 0 10*3/uL (ref 0.0–0.1)
Basophils Relative: 0.7 % (ref 0.0–3.0)
Eosinophils Absolute: 0.1 10*3/uL (ref 0.0–0.7)
Eosinophils Relative: 1.6 % (ref 0.0–5.0)
HCT: 29.3 % — ABNORMAL LOW (ref 36.0–46.0)
Hemoglobin: 9.7 g/dL — ABNORMAL LOW (ref 12.0–15.0)
Lymphocytes Relative: 32.2 % (ref 12.0–46.0)
Lymphs Abs: 2 10*3/uL (ref 0.7–4.0)
MCHC: 33.1 g/dL (ref 30.0–36.0)
MCV: 86.1 fl (ref 78.0–100.0)
Monocytes Absolute: 0.4 10*3/uL (ref 0.1–1.0)
Monocytes Relative: 6.3 % (ref 3.0–12.0)
Neutro Abs: 3.7 10*3/uL (ref 1.4–7.7)
Neutrophils Relative %: 59.2 % (ref 43.0–77.0)
Platelets: 267 10*3/uL (ref 150.0–400.0)
RBC: 3.4 Mil/uL — ABNORMAL LOW (ref 3.87–5.11)
RDW: 14.9 % (ref 11.5–15.5)
WBC: 6.3 10*3/uL (ref 4.0–10.5)

## 2021-12-18 LAB — LIPID PANEL
Cholesterol: 155 mg/dL (ref 0–200)
HDL: 56.8 mg/dL (ref >39.00)
LDL Cholesterol: 86 mg/dL (ref 0–99)
NonHDL: 98.2
Total CHOL/HDL Ratio: 3
Triglycerides: 62 mg/dL (ref 0.0–149.0)
VLDL: 12.4 mg/dL (ref 0.0–40.0)

## 2021-12-18 LAB — URINALYSIS, ROUTINE W REFLEX MICROSCOPIC
Bilirubin Urine: NEGATIVE
Hgb urine dipstick: NEGATIVE
Ketones, ur: NEGATIVE
Nitrite: POSITIVE — AB
Specific Gravity, Urine: 1.01 (ref 1.000–1.030)
Total Protein, Urine: NEGATIVE
Urine Glucose: NEGATIVE
Urobilinogen, UA: 0.2 (ref 0.0–1.0)
pH: 6 (ref 5.0–8.0)

## 2021-12-18 LAB — TSH: TSH: 1.75 u[IU]/mL (ref 0.35–5.50)

## 2021-12-18 NOTE — Progress Notes (Signed)
Subjective:  Patient ID: Katelyn Peterson, female    DOB: [birth year]  Age: 76 y.o. MRN: [id number]  CC: Follow-up (6 MONTH F/U)   HPI Monasia Jani Gravel presents for HTN, DM, knee OA  Outpatient Medications Prior to Visit  Medication Sig Dispense Refill   cetirizine (ZYRTEC) 10 MG tablet TAKE ONE TABLET BY MOUTH ONCE DAILY 70 tablet 5   Cholecalciferol 1000 UNITS tablet Take 1,000 Units by mouth daily.     Diclofenac Sodium 2 % SOLN Apply 1 pump twice daily. 112 g 3   ibuprofen (ADVIL,MOTRIN) 600 MG tablet TAKE 1 TABLET BY MOUTH TWICE DAILY AFTER MEALS FOR 1 WEEK, THEN AS NEEDED FOR PAIN 60 tablet 2   lisinopril (ZESTRIL) 40 MG tablet Take 1 tablet (40 mg total) by mouth daily. 90 tablet 3   loratadine (CLARITIN) 10 MG tablet Take 1 tablet (10 mg total) by mouth daily. 90 tablet 0   Menthol, Topical Analgesic, (BIOFREEZE) 4 % GEL Apply to affected area daily as needed 150 mL 0   metFORMIN (GLUCOPHAGE) 1000 MG tablet TAKE 1 TABLET BY MOUTH 2 TIMES DAILY WITH A MEAL 180 tablet 3   OVER THE COUNTER MEDICATION Goody Powder for headaches. PRN     Semaglutide,0.25 or 0.'5MG'$ /DOS, (OZEMPIC, 0.25 OR 0.5 MG/DOSE,) 2 MG/3ML SOPN Inject 0.5 mg into the skin once a week. 9 mL 3   traMADol (ULTRAM) 50 MG tablet Take 1 tablet (50 mg total) by mouth 4 (four) times daily. 100 tablet 2   verapamil (VERELAN PM) 240 MG 24 hr capsule Take 1 capsule (240 mg total) by mouth at bedtime. 90 capsule 3   atorvastatin (LIPITOR) 20 MG tablet TAKE 1 TABLET BY MOUTH ONCE DAILY 90 tablet 3   sitaGLIPtin (JANUVIA) 100 MG tablet TAKE 1 TABLET BY MOUTH ONCE DAILY IN THE MORNING 90 tablet 3   spironolactone (ALDACTONE) 50 MG tablet TAKE 1 TABLET BY MOUTH ONCE DAILY 90 tablet 3   No facility-administered medications prior to visit.    ROS: Review of Systems  Constitutional:  Negative for activity change, appetite change, chills, fatigue and unexpected weight change.  HENT:  Negative for congestion, mouth sores and  sinus pressure.   Eyes:  Negative for visual disturbance.  Respiratory:  Negative for cough and chest tightness.   Gastrointestinal:  Negative for abdominal pain and nausea.  Genitourinary:  Negative for difficulty urinating, frequency and vaginal pain.  Musculoskeletal:  Positive for arthralgias and gait problem. Negative for back pain.  Skin:  Negative for pallor and rash.  Neurological:  Negative for dizziness, tremors, weakness, numbness and headaches.  Psychiatric/Behavioral:  Negative for confusion and sleep disturbance.     Objective:  BP 130/72 (BP Location: Left Arm)   Pulse 75   Temp 97.8 F (36.6 C) (Oral)   Ht '5\' 4"'$  (1.626 m)   Wt 192 lb (87.1 kg)   SpO2 96%   BMI 32.96 kg/m   BP Readings from Last 3 Encounters:  12/18/21 130/72  11/07/21 130/78  06/15/21 132/80    Wt Readings from Last 3 Encounters:  12/18/21 192 lb (87.1 kg)  11/07/21 200 lb (90.7 kg)  06/15/21 198 lb (89.8 kg)    Physical Exam Constitutional:      General: She is not in acute distress.    Appearance: She is well-developed. She is obese.  HENT:     Head: Normocephalic.     Right Ear: External ear normal.     Left Ear:  External ear normal.     Nose: Nose normal.  Eyes:     General:        Right eye: No discharge.        Left eye: No discharge.     Conjunctiva/sclera: Conjunctivae normal.     Pupils: Pupils are equal, round, and reactive to light.  Neck:     Thyroid: No thyromegaly.     Vascular: No JVD.     Trachea: No tracheal deviation.  Cardiovascular:     Rate and Rhythm: Normal rate and regular rhythm.     Heart sounds: Normal heart sounds.  Pulmonary:     Effort: No respiratory distress.     Breath sounds: No stridor. No wheezing.  Abdominal:     General: Bowel sounds are normal. There is no distension.     Palpations: Abdomen is soft. There is no mass.     Tenderness: There is no abdominal tenderness. There is no guarding or rebound.  Musculoskeletal:        General:  No tenderness.     Cervical back: Normal range of motion and neck supple. No rigidity.  Lymphadenopathy:     Cervical: No cervical adenopathy.  Skin:    Findings: No erythema or rash.  Neurological:     Cranial Nerves: No cranial nerve deficit.     Motor: No abnormal muscle tone.     Coordination: Coordination normal.     Deep Tendon Reflexes: Reflexes normal.  Psychiatric:        Behavior: Behavior normal.        Thought Content: Thought content normal.        Judgment: Judgment normal.   Limp a little  Lab Results  Component Value Date   WBC 5.8 06/15/2021   HGB 10.0 (L) 06/15/2021   HCT 30.3 (L) 06/15/2021   PLT 254.0 06/15/2021   GLUCOSE 102 (H) 06/15/2021   CHOL 218 (H) 06/15/2021   TRIG 61.0 06/15/2021   HDL 60.30 06/15/2021   LDLDIRECT 146.5 09/18/2012   LDLCALC 146 (H) 06/15/2021   ALT 12 06/15/2021   AST 14 06/15/2021   NA 141 06/15/2021   K 4.7 06/15/2021   CL 108 06/15/2021   CREATININE 1.30 (H) 06/15/2021   BUN 21 06/15/2021   CO2 27 06/15/2021   TSH 1.35 06/15/2021   HGBA1C 5.9 (A) 11/07/2021   MICROALBUR 1.8 06/08/2020    US RENAL  Result Date: 01/05/2021 CLINICAL DATA:  Chronic kidney disease. EXAM: RENAL / URINARY TRACT ULTRASOUND COMPLETE COMPARISON:  None. FINDINGS: Right Kidney: Renal measurements: 9.4 x 4.8 x 6.0 cm = volume: 140 mL. Mild cortical thinning. Echogenicity within normal limits. No mass or hydronephrosis visualized. Left Kidney: Renal measurements: 10.3 x 5.6 x 6.8 cm = volume: 205 mL. Echogenicity within normal limits. No mass or hydronephrosis visualized. Bladder: Appears normal for degree of bladder distention. Other: None. IMPRESSION: 1. Mild right renal cortical thinning. Electronically Signed   By: Titus Dubin M.D.   On: 01/05/2021 10:26    Assessment & Plan:   Problem List Items Addressed This Visit     CRI (chronic renal insufficiency), stage 3 (moderate) ([place])    Hydrate well Take Aleve very infrequently       Relevant Orders   TSH   Urinalysis   CBC with Differential/Platelet   Lipid panel   Comprehensive metabolic panel   Essential hypertension    Spironolactone, quinapril, Verapamil Take Aleve very infrequently GFR 44  Relevant Orders   TSH   Urinalysis   CBC with Differential/Platelet   Lipid panel   Comprehensive metabolic panel   KNEE PAIN    Take Aleve very infrequently Mobic cream      Obesity    Wt Readings from Last 3 Encounters:  12/18/21 192 lb (87.1 kg)  11/07/21 200 lb (90.7 kg)  06/15/21 198 lb (89.8 kg)           No orders of the defined types were placed in this encounter.     Follow-up: Return in about 6 months (around 06/19/2022) for Wellness Exam.  Walker Kehr, MD

## 2021-12-18 NOTE — Assessment & Plan Note (Signed)
Take Aleve very infrequently Mobic cream

## 2021-12-18 NOTE — Assessment & Plan Note (Signed)
Hydrate well Take Aleve very infrequently

## 2021-12-18 NOTE — Assessment & Plan Note (Signed)
Wt Readings from Last 3 Encounters:  12/18/21 192 lb (87.1 kg)  11/07/21 200 lb (90.7 kg)  06/15/21 198 lb (89.8 kg)

## 2021-12-18 NOTE — Assessment & Plan Note (Signed)
Spironolactone, quinapril, Verapamil Take Aleve very infrequently GFR 44

## 2021-12-22 ENCOUNTER — Other Ambulatory Visit: Payer: Self-pay | Admitting: Internal Medicine

## 2021-12-22 ENCOUNTER — Other Ambulatory Visit (HOSPITAL_COMMUNITY): Payer: Self-pay

## 2021-12-22 ENCOUNTER — Telehealth: Payer: Self-pay | Admitting: *Deleted

## 2021-12-22 MED ORDER — CEPHALEXIN 500 MG PO CAPS
500.0000 mg | ORAL_CAPSULE | Freq: Three times a day (TID) | ORAL | 0 refills | Status: DC
  Filled 2021-12-22: qty 15, 5d supply, fill #0

## 2021-12-22 NOTE — Telephone Encounter (Signed)
Pt call back received labs results from MD she states " He can start me on jardiance if he want to " Please send to [employer].Marland KitchenJohny Chess

## 2021-12-24 MED ORDER — EMPAGLIFLOZIN 10 MG PO TABS
10.0000 mg | ORAL_TABLET | Freq: Every day | ORAL | 5 refills | Status: DC
  Filled 2021-12-24: qty 30, 30d supply, fill #0
  Filled 2022-01-29: qty 30, 30d supply, fill #1
  Filled 2022-02-26: qty 30, 30d supply, fill #2
  Filled 2022-08-30: qty 30, 30d supply, fill #3

## 2021-12-24 NOTE — Telephone Encounter (Signed)
Okay Jardiance.  Thanks

## 2021-12-25 ENCOUNTER — Other Ambulatory Visit (HOSPITAL_COMMUNITY): Payer: Self-pay

## 2021-12-25 NOTE — Telephone Encounter (Signed)
Pt is aware.Marland KitchenJohny Peterson

## 2022-01-15 ENCOUNTER — Other Ambulatory Visit (HOSPITAL_COMMUNITY): Payer: Self-pay

## 2022-01-15 ENCOUNTER — Other Ambulatory Visit: Payer: Self-pay | Admitting: Internal Medicine

## 2022-01-15 MED ORDER — VERAPAMIL HCL ER 240 MG PO CP24
240.0000 mg | ORAL_CAPSULE | Freq: Every day | ORAL | 3 refills | Status: DC
  Filled 2022-01-15: qty 90, 90d supply, fill #0
  Filled 2022-05-07: qty 90, 90d supply, fill #1
  Filled 2022-08-27 – 2022-09-11 (×2): qty 90, 90d supply, fill #2
  Filled 2022-12-20: qty 90, 90d supply, fill #3

## 2022-01-16 ENCOUNTER — Other Ambulatory Visit (HOSPITAL_COMMUNITY): Payer: Self-pay

## 2022-01-17 ENCOUNTER — Other Ambulatory Visit (HOSPITAL_COMMUNITY): Payer: Self-pay

## 2022-01-29 ENCOUNTER — Telehealth: Payer: Self-pay | Admitting: Internal Medicine

## 2022-01-29 ENCOUNTER — Other Ambulatory Visit (HOSPITAL_COMMUNITY): Payer: Self-pay

## 2022-01-29 NOTE — Telephone Encounter (Signed)
Placed form in MD desk for completion.Marland KitchenJohny Peterson

## 2022-01-29 NOTE — Telephone Encounter (Signed)
For our records:  We have received Pre-Op PW from [facility] for the pt and it has been placed in Dr. Judeen Hammans box.   Upon completion please fax to: [phone]

## 2022-01-31 NOTE — Telephone Encounter (Signed)
Faxed medical Clearance back to [facility].Marland KitchenJohny Chess

## 2022-01-31 NOTE — Telephone Encounter (Signed)
Done, thank you.  I would recommend surgical clearance by cardiology as well.  Thanks
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
## 2022-02-13 ENCOUNTER — Other Ambulatory Visit: Payer: Self-pay | Admitting: Internal Medicine

## 2022-02-13 ENCOUNTER — Other Ambulatory Visit (HOSPITAL_COMMUNITY): Payer: Self-pay

## 2022-02-13 MED ORDER — SPIRONOLACTONE 50 MG PO TABS
50.0000 mg | ORAL_TABLET | Freq: Every day | ORAL | 3 refills | Status: DC
  Filled 2022-02-13: qty 90, 90d supply, fill #0
  Filled 2022-06-14: qty 90, 90d supply, fill #1
  Filled 2022-12-03: qty 90, 90d supply, fill #2

## 2022-02-26 ENCOUNTER — Other Ambulatory Visit: Payer: Self-pay | Admitting: Internal Medicine

## 2022-02-26 ENCOUNTER — Other Ambulatory Visit: Payer: Self-pay

## 2022-02-26 ENCOUNTER — Other Ambulatory Visit (HOSPITAL_COMMUNITY): Payer: Self-pay

## 2022-02-27 ENCOUNTER — Other Ambulatory Visit (HOSPITAL_COMMUNITY): Payer: Self-pay

## 2022-02-27 MED ORDER — TRAMADOL HCL 50 MG PO TABS
50.0000 mg | ORAL_TABLET | Freq: Four times a day (QID) | ORAL | 2 refills | Status: DC
  Filled 2022-02-27: qty 100, 25d supply, fill #0

## 2022-03-23 ENCOUNTER — Other Ambulatory Visit: Payer: Self-pay | Admitting: Internal Medicine

## 2022-03-23 ENCOUNTER — Other Ambulatory Visit (HOSPITAL_COMMUNITY): Payer: Self-pay

## 2022-03-23 MED ORDER — LISINOPRIL 40 MG PO TABS
40.0000 mg | ORAL_TABLET | Freq: Every day | ORAL | 3 refills | Status: DC
  Filled 2022-03-23: qty 90, 90d supply, fill #0
  Filled 2022-07-30: qty 90, 90d supply, fill #1
  Filled 2022-12-20: qty 90, 90d supply, fill #2

## 2022-03-28 ENCOUNTER — Other Ambulatory Visit (HOSPITAL_COMMUNITY): Payer: Self-pay

## 2022-03-29 ENCOUNTER — Other Ambulatory Visit (HOSPITAL_COMMUNITY): Payer: Self-pay

## 2022-03-30 ENCOUNTER — Other Ambulatory Visit (HOSPITAL_COMMUNITY): Payer: Self-pay

## 2022-04-09 NOTE — Telephone Encounter (Signed)
[facility] called to get clarification on a surgical request, because Dr. Alain Marion wrote on it that the pt should also get clearance from her cardiologist, but the pt has never seen anyone for cardiac care.    Please advise on whether pt should proceed with [facility] for procedure. [phone]

## 2022-04-10 NOTE — Telephone Encounter (Signed)
Called [facility] spoke w/ Seth Bake gave MD response. Per Seth Bake need note fax over to [phone]...Katelyn Peterson

## 2022-04-10 NOTE — H&P (Signed)
TOTAL KNEE ADMISSION H&P  Patient is being admitted for right total knee arthroplasty.  Subjective:  Chief Complaint: Right knee pain.  HPI: Katelyn Peterson, 77 y.o. female has a history of pain and functional disability in the right knee due to arthritis and has failed non-surgical conservative treatments for greater than 12 weeks to include NSAID's and/or analgesics, corticosteriod injections, viscosupplementation injections, weight reduction as appropriate, and activity modification. Onset of symptoms was gradual, starting >10 years ago with gradually worsening course since that time. Patient currently rates pain in the right knee at 7 out of 10 with activity. Patient has worsening of pain with activity and weight bearing and pain that interferes with activities of daily living. Patient has evidence of subchondral sclerosis, periarticular osteophytes, and joint space narrowing by imaging studies.  There is no active infection.  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 Authorizing Provider  atorvastatin (LIPITOR) 20 MG tablet TAKE 1 TABLET BY MOUTH ONCE DAILY 06/27/20 06/27/21  Plotnikov, Evie Lacks, MD  cephALEXin (KEFLEX) 500 MG capsule Take 1 capsule (500 mg total) by mouth 3 (three) times daily. 12/22/21   Plotnikov, Evie Lacks, MD  cetirizine (ZYRTEC) 10 MG tablet TAKE ONE TABLET BY MOUTH ONCE DAILY 11/04/14   Panosh, Standley Brooking, MD  Cholecalciferol 1000 UNITS tablet Take 1,000 Units by mouth daily.    [provider]  Diclofenac Sodium 2 % SOLN Apply 1 pump twice daily. 04/01/15   Lyndal Pulley, DO  empagliflozin (JARDIANCE) 10 MG TABS tablet Take 1 tablet (10 mg total) by mouth daily before breakfast. 12/24/21   Plotnikov, Evie Lacks, MD  ibuprofen (ADVIL,MOTRIN) 600 MG tablet TAKE 1 TABLET BY MOUTH TWICE DAILY AFTER MEALS FOR 1 WEEK, THEN AS NEEDED FOR PAIN 10/05/15   Plotnikov, Evie Lacks, MD  lisinopril (ZESTRIL) 40 MG tablet Take 1 tablet (40 mg total) by mouth daily. 03/23/22   Plotnikov, Evie Lacks, MD  loratadine (CLARITIN) 10 MG tablet Take 1 tablet (10 mg total) by mouth daily. 06/22/13   Kennyth Arnold, FNP  Menthol, Topical Analgesic, (BIOFREEZE) 4 % GEL Apply to affected area daily as needed 08/25/13   Dutch Quint B, FNP  metFORMIN (GLUCOPHAGE) 1000 MG tablet TAKE 1 TABLET BY MOUTH 2 TIMES DAILY WITH A MEAL 05/08/21 05/08/22  Philemon Kingdom, MD  [place]  for headaches. PRN    [provider]  Semaglutide,0.25 or 0.'5MG'$ /DOS, (OZEMPIC, 0.25 OR 0.5 MG/DOSE,) 2 MG/3ML SOPN Inject 0.5 mg into the skin once a week. 11/09/21   Philemon Kingdom, MD  sitaGLIPtin (JANUVIA) 100 MG tablet TAKE 1 TABLET BY MOUTH ONCE DAILY IN THE MORNING 12/14/20 12/14/21  Plotnikov, Evie Lacks, MD  spironolactone (ALDACTONE) 50 MG tablet Take 1 tablet (50 mg total) by mouth daily. 02/13/22    Plotnikov, Evie Lacks, MD  traMADol (ULTRAM) 50 MG tablet Take 1 tablet (50 mg total) by mouth 4 (four) times daily. 02/27/22   Plotnikov, Evie Lacks, MD  verapamil (VERELAN PM) 240 MG 24 hr capsule Take 1 capsule (240 mg total) by mouth at bedtime. 01/15/22   Plotnikov, Evie Lacks, MD    Allergies  Allergen Reactions   Atorvastatin     REACTION: cramps   Hydrochlorothiazide     REACTION: Cramps   Pravastatin     cramps    Social History   Socioeconomic History   Marital status: Married    Spouse name: Not on file   Number of children: Not on file   Years of education: Not on file   Highest education level: Not on file  Occupational History   Occupation: [employer]  Tobacco Use   Smoking status: Never   Smokeless tobacco: Never  Substance and Sexual Activity   Alcohol use: No   Drug use: No   Sexual activity: Yes    Partners: Male  Other Topics Concern   Not on file  Social History Narrative   Regular exercise: seldom   Caffeine use: occasionally         Social Determinants of Health   Financial Resource Strain: Not on file  Food Insecurity: Not on file  Transportation Needs: Not on file  Physical Activity: Not on file  Stress: Not on file  Social Connections: Not on file  Intimate Partner Violence: Not on file    Tobacco Use: Low Risk  (12/18/2021)   Patient History    Smoking Tobacco Use: Never    Smokeless Tobacco Use: Never    Passive Exposure: Not on file   Social History   Substance and Sexual Activity  Alcohol Use No    Family History  Problem Relation Age of Onset   Hypertension Mother    Diabetes Mother    Hypertension Father    Hypertension Other    Colon cancer Neg Hx    Esophageal cancer Neg Hx    Rectal cancer Neg Hx    Stomach cancer Neg Hx     ROS  Objective: The patient is a pleasant, well-developed female alert and oriented in no apparent distress.    The patient has a significantly antalgic gait pattern favoring the  right side.    Right Knee Exam:  No effusion present. No swelling present.  The range of motion is: 5 to 120 degrees.  Moderate crepitus on range of motion of the knee.  Positive medial joint line tenderness.  No lateral joint line tenderness.  The knee is stable.    The patient's sensation and motor function are intact in their lower extremities. Their distal pulses are 2+. The bilateral calves are soft and non-tender.   IMAGING RESULTS  - bilateral AP and lateral of the right knee dated 11/17/2021 demonstrate severe tricompartmental osteoarthritis changes on the right with massive calcified loose bodies throughout the joint.    Assessment/Plan:  End stage arthritis, right knee  The patient history, physical examination, clinical judgment of the provider and imaging studies are consistent with end stage degenerative joint disease of the right knee and total knee arthroplasty is deemed medically necessary. The treatment options including medical management, injection therapy arthroscopy and arthroplasty were discussed at length. The risks and benefits of total knee arthroplasty were presented and reviewed. The risks due to aseptic loosening, infection, stiffness, patella tracking problems, thromboembolic complications and other imponderables were discussed. The patient acknowledged the explanation, agreed to proceed with the plan and consent was signed. Patient is being admitted for inpatient treatment for surgery, pain control, PT, OT, prophylactic antibiotics, VTE prophylaxis, progressive ambulation and ADLs and discharge planning. The patient is planning to be discharged home with husband and outpatient PT arranged.   Patient's anticipated LOS is less than 2 midnights, meeting these requirements: - Younger than 66 - Lives within 1 hour of care - Has a competent adult at home to recover with post-op recover - NO history of  - Chronic pain requiring opiods  - Coronary Artery  Disease  - Heart failure  - Heart attack  - Stroke  - DVT/VTE  - Cardiac arrhythmia  - Respiratory Failure/COPD  - Renal failure  - Anemia  - Advanced Liver disease  Therapy Plans: [facility] [place] Disposition: Home with husband and grandson Planned DVT Prophylaxis: Aspirin DME Needed: Gilford Rile PCP: Nunzio Cory, MD - clearance received TXA: IV Allergies: NKDA Anesthesia Concerns: None BMI: 32.8 Last HgbA1c: 5.9 (10/2021)  Pharmacy: [facility])  - Patient was instructed on what medications to stop prior to surgery. - Follow-up visit in 2 weeks with Dr. Wynelle Link - Begin physical therapy following surgery - Pre-operative lab work as pre-surgical testing - Prescriptions will be provided in hospital at time of discharge  Shearon Balo, PA-C Orthopedic Surgery [facility] [place]

## 2022-04-10 NOTE — Telephone Encounter (Signed)
OK to clear for surgery IM/Cards. Thx

## 2022-04-11 ENCOUNTER — Other Ambulatory Visit: Payer: Self-pay | Admitting: Internal Medicine

## 2022-04-11 ENCOUNTER — Telehealth: Payer: Self-pay | Admitting: Internal Medicine

## 2022-04-11 ENCOUNTER — Other Ambulatory Visit (HOSPITAL_COMMUNITY): Payer: Self-pay

## 2022-04-11 MED ORDER — ATORVASTATIN CALCIUM 20 MG PO TABS
ORAL_TABLET | Freq: Every day | ORAL | 3 refills | Status: DC
  Filled 2022-04-11: qty 90, 90d supply, fill #0
  Filled 2022-08-27: qty 90, 90d supply, fill #1
  Filled 2022-12-20: qty 90, 90d supply, fill #2

## 2022-04-11 NOTE — Telephone Encounter (Signed)
For our records:  We have received FMLA forms for the pt and they have been placed in Dr. Judeen Hammans boxes.   Upon completion please fax them to: [phone]

## 2022-04-11 NOTE — Telephone Encounter (Signed)
Place form in MD purple folder to complete/sign.Marland KitchenJohny Peterson

## 2022-04-12 ENCOUNTER — Other Ambulatory Visit (HOSPITAL_COMMUNITY): Payer: Self-pay

## 2022-04-12 NOTE — Telephone Encounter (Signed)
MD completed/signed FMLA fax forms back to [employer] and notified pt she can pick up original../lb

## 2022-04-13 NOTE — Patient Instructions (Signed)
DUE TO COVID-19 ONLY TWO VISITORS  (aged 76 and older)  ARE ALLOWED TO COME WITH YOU AND STAY IN THE WAITING ROOM ONLY DURING PRE OP AND PROCEDURE.   **NO VISITORS ARE ALLOWED IN THE SHORT STAY AREA OR RECOVERY ROOM!!**  IF YOU WILL BE ADMITTED INTO THE HOSPITAL YOU ARE ALLOWED ONLY FOUR SUPPORT PEOPLE DURING VISITATION HOURS ONLY (7 AM -8PM)   The support person(s) must pass our screening, gel in and out, and wear a mask at all times, including in the patient's room. Patients must also wear a mask when staff or their support person are in the room. Visitors GUEST BADGE MUST BE WORN VISIBLY  One adult visitor may remain with you overnight and MUST be in the room by 8 P.M.     Your procedure is scheduled on: 04/30/22   Report to [facility] Main Entrance    Report to admitting at  5:45 AM   Call this number if you have problems the morning of surgery [phone]   Do not eat food :After Midnight.   After Midnight you may have the following liquids until _5:15_ AM/  DAY OF SURGERY  Water Black Coffee (sugar ok, NO MILK/CREAM OR CREAMERS)  Tea (sugar ok, NO MILK/CREAM OR CREAMERS) regular and decaf                             Plain Jell-O (NO RED)                                           Fruit ices (not with fruit pulp, NO RED)                                     Popsicles (NO RED)                                                                  Juice: apple, WHITE grape, WHITE cranberry Sports drinks like Gatorade (NO RED)                   The day of surgery:  Drink ONE (1) G2 at 5:00  AM the morning of surgery. Drink in one sitting. Do not sip.  This drink was given to you during your hospital  pre-op appointment visit. Nothing else to drink after completing the  G2. At 5:15 AM          If you have questions, please contact your surgeon's office.      Oral Hygiene is also important to reduce your risk of infection.                                    Remember -  BRUSH YOUR TEETH THE MORNING OF SURGERY WITH YOUR REGULAR TOOTHPASTE  DENTURES WILL BE REMOVED PRIOR TO SURGERY PLEASE DO NOT APPLY "Poly grip" OR ADHESIVES!!!   Do NOT smoke after Midnight    Take these medicines  the morning of surgery with A SIP OF WATER: Loratadine                                                                                                                            Atorvastatin                             How to Manage Your Diabetes Before and After Surgery  Why is it important to control my blood sugar before and after surgery? Improving blood sugar levels before and after surgery helps healing and can limit problems. A way of improving blood sugar control is eating a healthy diet by:  Eating less sugar and carbohydrates  Increasing activity/exercise  Talking with your doctor about reaching your blood sugar goals High blood sugars (greater than 180 mg/dL) can raise your risk of infections and slow your recovery, so you will need to focus on controlling your diabetes during the weeks before surgery. Make sure that the doctor who takes care of your diabetes knows about your planned surgery including the date and location.  How do I manage my blood sugar before surgery? Check your blood sugar at least 4 times a day, starting 2 days before surgery, to make sure that the level is not too high or low. Check your blood sugar the morning of your surgery when you wake up and every 2 hours until you get to the Short Stay unit. If your blood sugar is less than 70 mg/dL, you will need to treat for low blood sugar: Do not take insulin. Treat a low blood sugar (less than 70 mg/dL) with  cup of clear juice (cranberry or apple), 4 glucose tablets, OR glucose gel. Recheck blood sugar in 15 minutes after treatment (to make sure it is greater than 70 mg/dL). If your blood sugar is not greater than 70 mg/dL on recheck, call [phone] for further instructions. Report your blood  sugar to the short stay nurse when you get to Short Stay.  If you are admitted to the hospital after surgery: Your blood sugar will be checked by the staff and you will probably be given insulin after surgery (instead of oral diabetes medicines) to make sure you have good blood sugar levels. The goal for blood sugar control after surgery is 80-180 mg/dL.   WHAT DO I DO ABOUT MY DIABETES MEDICATION?  Do not take your Jardiance and Januvia for 3 days prior to day of surgery. Last dose is 04/26/22.  Do not take oral diabetes medicines (pills) the morning of surgery. (Metformin)  DO NOT TAKE THE FOLLOWING 7 DAYS PRIOR TO SURGERY: Ozempic, last dose is 04/22/22   Bring CPAP mask and tubing day of surgery.                              You may not have any metal  on your body including hair pins, jewelry, and body piercing             Do not wear make-up, lotions, powders, perfumes/cologne, or deodorant  Do not wear nail polish including gel and S&S, artificial/acrylic nails, or any other type of covering on natural nails including finger and toenails. If you have artificial nails, gel coating, etc. that needs to be removed by a nail salon please have this removed prior to surgery or surgery may need to be canceled/ delayed if the surgeon/ anesthesia feels like they are unable to be safely monitored.   Do not shave  48 hours prior to surgery.  .   Do not bring valuables to the hospital. [place].   Contacts, glasses, or bridgework may not be worn into surgery.   Bring small overnight bag day of surgery.   DO NOT [place]. PHARMACY WILL DISPENSE MEDICATIONS LISTED ON YOUR MEDICATION LIST TO YOU DURING YOUR ADMISSION [place]!      Special Instructions: Bring a copy of your healthcare power of attorney and living will documents  the day of surgery if you haven't scanned them before.               Please read over the following fact sheets you were given: IF YOU HAVE QUESTIONS ABOUT YOUR PRE-OP INSTRUCTIONS PLEASE CALL [phone]    [facility] - Preparing for Surgery Before surgery, you can play an important role.  Because skin is not sterile, your skin needs to be as free of germs as possible.  You can reduce the number of germs on your skin by washing with CHG (chlorahexidine gluconate) soap before surgery.  CHG is an antiseptic cleaner which kills germs and bonds with the skin to continue killing germs even after washing. Please DO NOT use if you have an allergy to CHG or antibacterial soaps.  If your skin becomes reddened/irritated stop using the CHG and inform your nurse when you arrive at Short Stay. Do not shave (including legs and underarms) for at least 48 hours prior to the first CHG shower Please follow these instructions carefully:  1.  Shower with CHG Soap the night before surgery and the  morning of Surgery.  2.  If you choose to wash your hair, wash your hair first as usual with your  normal  shampoo.  3.  After you shampoo, rinse your hair and body thoroughly to remove the  shampoo.                            4.  Use CHG as you would any other liquid soap.  You can apply chg directly  to the skin and wash                       Gently with a scrungie or clean washcloth.  5.  Apply the CHG Soap to your body ONLY FROM THE NECK DOWN.   Do not use on face/ open                           Wound or open sores. Avoid contact with eyes, ears mouth and genitals (private parts).  Wash face,  Genitals (private parts) with your normal soap.             6.  Wash thoroughly, paying special attention to the area where your surgery  will be performed.  7.  Thoroughly rinse your body with warm water from the neck down.  8.  DO NOT shower/wash with your normal soap after using and rinsing off  the CHG Soap.             9.  Pat yourself dry with a clean towel.             10.  Wear clean pajamas.            11.  Place clean sheets on your bed the night of your first shower and do not  sleep with pets. Day of Surgery : Do not apply any lotions/deodorants the morning of surgery.  Please wear clean clothes to the hospital/surgery center.  FAILURE TO FOLLOW THESE INSTRUCTIONS MAY RESULT IN THE CANCELLATION OF YOUR SURGERY  ________________________________________________________________________  Incentive Spirometer  An incentive spirometer is a tool that can help keep your lungs clear and active. This tool measures how well you are filling your lungs with each breath. Taking long deep breaths may help reverse or decrease the chance of developing breathing (pulmonary) problems (especially infection) following: A long period of time when you are unable to move or be active. BEFORE THE PROCEDURE  If the spirometer includes an indicator to show your best effort, your nurse or respiratory therapist will set it to a desired goal. If possible, sit up straight or lean slightly forward. Try not to slouch. Hold the incentive spirometer in an upright position. INSTRUCTIONS FOR USE  Sit on the edge of your bed if possible, or sit up as far as you can in bed or on a chair. Hold the incentive spirometer in an upright position. Breathe out normally. Place the mouthpiece in your mouth and seal your lips tightly around it. Breathe in slowly and as deeply as possible, raising the piston or the ball toward the top of the column. Hold your breath for 3-5 seconds or for as long as possible. Allow the piston or ball to fall to the bottom of the column. Remove the mouthpiece from your mouth and breathe out normally. Rest for a few seconds and repeat Steps 1 through 7 at least 10 times every 1-2 hours when you are awake. Take your time and take a few normal breaths between deep breaths. The spirometer may include an indicator to show your best effort. Use the indicator as a goal to  work toward during each repetition. After each set of 10 deep breaths, practice coughing to be sure your lungs are clear. If you have an incision (the cut made at the time of surgery), support your incision when coughing by placing a pillow or rolled up towels firmly against it. Once you are able to get out of bed, walk around indoors and cough well. You may stop using the incentive spirometer when instructed by your caregiver.  RISKS AND COMPLICATIONS Take your time so you do not get dizzy or light-headed. If you are in pain, you may need to take or ask for pain medication before doing incentive spirometry. It is harder to take a deep breath if you are having pain. AFTER USE Rest and breathe slowly and easily. It can be helpful to keep track of a log of your progress. Your caregiver can  provide you with a simple table to help with this. If you are using the spirometer at home, follow these instructions: [place] IF:  You are having difficultly using the spirometer. You have trouble using the spirometer as often as instructed. Your pain medication is not giving enough relief while using the spirometer. You develop fever of 100.5 F (38.1 C) or higher. SEEK IMMEDIATE MEDICAL CARE IF:  You cough up bloody sputum that had not been present before. You develop fever of 102 F (38.9 C) or greater. You develop worsening pain at or near the incision site. MAKE SURE YOU:  Understand these instructions. Will watch your condition. Will get help right away if you are not doing well or get worse. Document Released: 07/09/2006 Document Revised: 05/21/2011 Document Reviewed: 09/09/2006 [facility] Patient Information 2014 [place], [place].   ________________________________________________________________________

## 2022-04-16 ENCOUNTER — Telehealth: Payer: Self-pay | Admitting: Internal Medicine

## 2022-04-16 NOTE — Telephone Encounter (Signed)
Duplicate forms. Pt already has pick-up her FMLA up../l;mb

## 2022-04-16 NOTE — Telephone Encounter (Signed)
For Our Records:  We have received FMLA forms for the pt and they have been placed in Dr. Judeen Hammans boxes  [phone]

## 2022-04-18 ENCOUNTER — Encounter (HOSPITAL_COMMUNITY)
Admission: RE | Admit: 2022-04-18 | Discharge: 2022-04-18 | Disposition: A | Discharge status: Home / Self Care | Payer: Commercial Managed Care - PPO | Source: Ambulatory Visit | Attending: Orthopedic Surgery | Admitting: Orthopedic Surgery

## 2022-04-18 ENCOUNTER — Other Ambulatory Visit: Payer: Self-pay

## 2022-04-18 ENCOUNTER — Encounter (HOSPITAL_COMMUNITY): Payer: Self-pay

## 2022-04-18 DIAGNOSIS — Z01818 Encounter for other preprocedural examination: Secondary | ICD-10-CM | POA: Insufficient documentation

## 2022-04-18 DIAGNOSIS — E113592 Type 2 diabetes mellitus with proliferative diabetic retinopathy without macular edema, left eye: Secondary | ICD-10-CM | POA: Diagnosis not present

## 2022-04-18 DIAGNOSIS — I498 Other specified cardiac arrhythmias: Secondary | ICD-10-CM | POA: Diagnosis not present

## 2022-04-18 LAB — CBC
HCT: 30 % — ABNORMAL LOW (ref 36.0–46.0)
Hemoglobin: 9.1 g/dL — ABNORMAL LOW (ref 12.0–15.0)
MCH: 26.8 pg (ref 26.0–34.0)
MCHC: 30.3 g/dL (ref 30.0–36.0)
MCV: 88.5 fL (ref 80.0–100.0)
Platelets: 312 10*3/uL (ref 150–400)
RBC: 3.39 MIL/uL — ABNORMAL LOW (ref 3.87–5.11)
RDW: 14.8 % (ref 11.5–15.5)
WBC: 6.1 10*3/uL (ref 4.0–10.5)
nRBC: 0 % (ref 0.0–0.2)

## 2022-04-18 LAB — GLUCOSE, CAPILLARY: Glucose-Capillary: 94 mg/dL (ref 70–99)

## 2022-04-18 LAB — BASIC METABOLIC PANEL
Anion gap: 9 (ref 5–15)
BUN: 29 mg/dL — ABNORMAL HIGH (ref 8–23)
CO2: 22 mmol/L (ref 22–32)
Calcium: 9.2 mg/dL (ref 8.9–10.3)
Chloride: 108 mmol/L (ref 98–111)
Creatinine, Ser: 1.46 mg/dL — ABNORMAL HIGH (ref 0.44–1.00)
GFR, Estimated: 37 mL/min — ABNORMAL LOW (ref >60)
Glucose, Bld: 97 mg/dL (ref 70–99)
Potassium: 5.2 mmol/L — ABNORMAL HIGH (ref 3.5–5.1)
Sodium: 139 mmol/L (ref 135–145)

## 2022-04-18 LAB — SURGICAL PCR SCREEN
MRSA, PCR: NEGATIVE
Staphylococcus aureus: NEGATIVE

## 2022-04-18 LAB — HEMOGLOBIN A1C
Hgb A1c MFr Bld: 5.8 % — ABNORMAL HIGH (ref 4.8–5.6)
Mean Plasma Glucose: 119.76 mg/dL

## 2022-04-18 NOTE — Progress Notes (Signed)
Anesthesia note:  Bowel prep reminder: NA   PCP - Dr. Loni Muse Plotnikov Cardiologist -none Other-   Chest x-ray - no EKG - 04/18/22-chart Stress Test - no ECHO - no Cardiac Cath - no CABG-no Pacemaker/ICD device last checked:NA  Sleep Study - no CPAP -    CBG at PAT visit-94 Fasting Blood Sugar at home-89-99 Checks Blood Sugar _QD____  Blood Thinner:ASA Blood Thinner Instructions: Aspirin Instructions:stop 7 days  Last Dose:04/22/22  Anesthesia review:/No    Patient denies shortness of breath, fever, cough and chest pain at PAT appointment. Pt has no SOB with activities.   Patient verbalized understanding of instructions that were given to them at the PAT appointment. Patient was also instructed that they will need to review over the PAT instructions again at home before surgeryyes

## 2022-04-19 ENCOUNTER — Encounter (HOSPITAL_COMMUNITY): Payer: Self-pay | Admitting: Physician Assistant

## 2022-04-20 ENCOUNTER — Telehealth: Payer: Self-pay | Admitting: *Deleted

## 2022-04-20 NOTE — Telephone Encounter (Signed)
Pt sent msg stating " Hi Katelyn Peterson my Hemoglobin is still down to  9.7 can he sent me something iron pill in".Marland KitchenJohny Peterson

## 2022-04-22 MED ORDER — IRON (FERROUS SULFATE) 325 (65 FE) MG PO TABS
325.0000 mg | ORAL_TABLET | Freq: Every day | ORAL | 3 refills | Status: AC
  Filled 2022-04-22: qty 30, 30d supply, fill #0

## 2022-04-22 NOTE — Telephone Encounter (Signed)
Okay. Thank you.

## 2022-04-23 ENCOUNTER — Other Ambulatory Visit (HOSPITAL_COMMUNITY): Payer: Self-pay

## 2022-04-23 NOTE — Telephone Encounter (Signed)
Notified pt MD sent iron to [place]./mb

## 2022-04-30 ENCOUNTER — Encounter (HOSPITAL_COMMUNITY): Admission: RE | Payer: Self-pay | Source: Ambulatory Visit

## 2022-04-30 ENCOUNTER — Ambulatory Visit (HOSPITAL_COMMUNITY)
Admission: RE | Admit: 2022-04-30 | Payer: Commercial Managed Care - PPO | Source: Ambulatory Visit | Admitting: Orthopedic Surgery

## 2022-04-30 DIAGNOSIS — E113592 Type 2 diabetes mellitus with proliferative diabetic retinopathy without macular edema, left eye: Secondary | ICD-10-CM

## 2022-04-30 DIAGNOSIS — Z01818 Encounter for other preprocedural examination: Secondary | ICD-10-CM

## 2022-04-30 SURGERY — ARTHROPLASTY, KNEE, TOTAL
Anesthesia: Choice | Site: Knee | Laterality: Right

## 2022-05-02 NOTE — Therapy (Incomplete)
OUTPATIENT PHYSICAL THERAPY LOWER EXTREMITY EVALUATION   Patient Name: Katelyn Peterson MRN: [id number] DOB:[birth year], 77 y.o., female Today's Date: 05/02/2022  END OF SESSION:   Past Medical History:  Diagnosis Date   Arthritis    HTN (hypertension)    Hyperlipidemia    Obesity    Type II or unspecified type diabetes mellitus without mention of complication, not stated as uncontrolled    Past Surgical History:  Procedure Laterality Date   ABDOMINAL HYSTERECTOMY  1985   Patient Active Problem List   Diagnosis Date Noted   Arthritis of left foot 04/11/2021   CRI (chronic renal insufficiency), stage 3 (moderate) ([place]) 12/15/2020   Wrist pain, left 12/10/2018   Knee pain, right 06/19/2017   Anemia 05/08/2016   Onychomycosis 06/22/2015   Arthritis of ankle 04/01/2015   Well adult exam 08/16/2014   Goiter 11/04/2012   Left ankle pain 08/22/2011   Grief 04/20/2011   Rhinitis 04/20/2011   ABDOMINAL DISTENSION 03/09/2010   KNEE PAIN 03/04/2008   LEG CRAMPS 09/03/2007   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  Rationale for Evaluation and Treatment: Rehabilitation  ONSET DATE: surgery 04/30/22  SUBJECTIVE:   SUBJECTIVE STATEMENT: Pt presents to PT 3 days s/p TKA on the Rt knee.    PERTINENT HISTORY: Rt TKA (04/30/22), HTN, DM2, PAIN:  Are you having pain? Yes: NPRS scale: ***/10 Pain location: *** Pain description: *** Aggravating factors: *** Relieving factors: ***  PRECAUTIONS: None  WEIGHT BEARING RESTRICTIONS: {Yes ***/No:24003}  FALLS:  Has patient fallen in last 6 months? {fallsyesno:27318}  LIVING  ENVIRONMENT: Lives with: {OPRC lives with:25569::"lives with their family"} Lives in: {Lives in:25570} Stairs: {opstairs:27293} Has following equipment at home: {Assistive devices:23999}  OCCUPATION: ***  PLOF: Independent and Leisure: ***  PATIENT GOALS: wean from walker, improve Rt LE flexibility  NEXT MD VISIT: ***  OBJECTIVE:   DIAGNOSTIC FINDINGS: ***  PATIENT SURVEYS:  FOTO ***  COGNITION: Overall cognitive status: Within functional limits for tasks assessed     SENSATION: WFL  EDEMA:  Circumferential: ***  MUSCLE LENGTH: Hamstrings: Right *** deg; Left *** deg Thomas test: Right *** deg; Left *** deg  POSTURE: {posture:25561}  PALPATION: ***  LOWER EXTREMITY ROM:  Active ROM Right eval Left eval  Hip flexion    Hip extension    Hip abduction    Hip adduction    Hip internal rotation    Hip external rotation    Knee flexion    Knee extension    Ankle dorsiflexion    Ankle plantarflexion    Ankle inversion    Ankle eversion     (Blank rows = not tested)  LOWER EXTREMITY MMT:  MMT Right eval Left eval  Hip flexion    Hip extension    Hip abduction    Hip adduction    Hip internal rotation    Hip external rotation    Knee flexion    Knee extension    Ankle dorsiflexion    Ankle plantarflexion    Ankle inversion    Ankle eversion     (Blank rows = not tested)   FUNCTIONAL TESTS:  5 times sit to stand: ***  Timed up and go (TUG): ***  GAIT: Distance walked: *** Assistive device utilized: Environmental consultant - 2 wheeled Level of assistance: {Levels of assistance:24026} Comments: ***   TODAY'S TREATMENT:                                                                                                                              DATE: 03/03/23    PATIENT EDUCATION:  Education details: *** Person educated: Patient Education method: Consulting civil engineer, Media planner, and Handouts Education comprehension: verbalized understanding and returned  demonstration  HOME EXERCISE PROGRAM: ***  ASSESSMENT:  CLINICAL IMPRESSION: Patient is a 77 y.o. female who was seen today for physical therapy evaluation and treatment for post op rehab s/p TKA on the Rt 3 days ago.   OBJECTIVE IMPAIRMENTS: {opptimpairments:25111}.   ACTIVITY LIMITATIONS: {activitylimitations:27494}  PARTICIPATION LIMITATIONS: {participationrestrictions:25113}  PERSONAL FACTORS: {Personal factors:25162} are also affecting patient's functional outcome.   REHAB POTENTIAL: {rehabpotential:25112}  CLINICAL DECISION MAKING: {clinical decision making:25114}  EVALUATION COMPLEXITY: {Evaluation complexity:25115}   GOALS: Goals reviewed with patient? Yes  SHORT TERM GOALS: Target date: *** Be independent in initial HEP Baseline: Goal status: INITIAL  2.  *** Baseline:  Goal status: {GOALSTATUS:25110}  3.  *** Baseline:  Goal status: {GOALSTATUS:25110}  4.  *** Baseline:  Goal status: {GOALSTATUS:25110}  5.  *** Baseline:  Goal status: {GOALSTATUS:25110}    LONG TERM GOALS: Target date: ***  Be independent in advanced HEP Baseline:  Goal status: INITIAL  2.  Improve FOTO to be < or = to  Baseline:  Goal status: INITIAL  3.  *** Baseline:  Goal status: {GOALSTATUS:25110}  4.  *** Baseline:  Goal status: {GOALSTATUS:25110}  5.  *** Baseline:  Goal status: {GOALSTATUS:25110}  6.  *** Baseline:  Goal status: {GOALSTATUS:25110}   PLAN:  PT FREQUENCY: 2x/week  PT DURATION: 8 weeks  PLANNED INTERVENTIONS: Therapeutic exercises, Therapeutic activity, Neuromuscular re-education, Balance training, Gait training, Patient/Family education, Self Care, Joint mobilization, Joint manipulation, Stair training, Aquatic Therapy, Dry Needling, Electrical stimulation, Cryotherapy, Moist heat, scar mobilization, Taping, Vasopneumatic device, Manual therapy, and Re-evaluation  PLAN FOR NEXT SESSION: review HEP, address Rt LE ROM, strength, gait  and edema management    Sigurd Sos, PT 05/02/22 9:41 AM   [place] [address], [place], [place] [address] Phone # [phone] Fax [phone]

## 2022-05-03 ENCOUNTER — Ambulatory Visit: Payer: Commercial Managed Care - PPO | Attending: Rehabilitative and Restorative Service Providers"

## 2022-05-07 ENCOUNTER — Other Ambulatory Visit (HOSPITAL_COMMUNITY): Payer: Self-pay

## 2022-05-08 ENCOUNTER — Other Ambulatory Visit (HOSPITAL_COMMUNITY): Payer: Self-pay

## 2022-05-11 ENCOUNTER — Ambulatory Visit: Payer: Commercial Managed Care - PPO | Admitting: Internal Medicine

## 2022-05-11 ENCOUNTER — Encounter: Payer: Self-pay | Admitting: Internal Medicine

## 2022-05-11 VITALS — BP 130/72 | HR 62 | Ht 64.5 in | Wt 186.0 lb

## 2022-05-11 DIAGNOSIS — E785 Hyperlipidemia, unspecified: Secondary | ICD-10-CM | POA: Diagnosis not present

## 2022-05-11 DIAGNOSIS — E1169 Type 2 diabetes mellitus with other specified complication: Secondary | ICD-10-CM

## 2022-05-11 DIAGNOSIS — E113592 Type 2 diabetes mellitus with proliferative diabetic retinopathy without macular edema, left eye: Secondary | ICD-10-CM

## 2022-05-11 DIAGNOSIS — Z6835 Body mass index (BMI) 35.0-35.9, adult: Secondary | ICD-10-CM | POA: Diagnosis not present

## 2022-05-11 MED ORDER — METFORMIN HCL 1000 MG PO TABS: 1000.0000 mg | ORAL_TABLET | Freq: Every day | ORAL | 3 refills | Status: DC

## 2022-05-11 NOTE — Patient Instructions (Addendum)
Please decrease: - Metformin 1000 mg 1x a day  (may stop after 2 weeks if sugars remain at goal)  Continue: - Jardianec 10 mg in am - Ozempic 0.5 mg weekly  Please come back for a follow-up appointment in 6 months.

## 2022-05-11 NOTE — Progress Notes (Signed)
Patient ID: Katelyn Peterson, female   DOB: 04-06-1945, 77 y.o.   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 Last visit 6 months ago.  Interim history: No increased urination, blurry vision, nausea, chest pain. She changed diet before last visit>> cut out sweets, more veggies. We started Ozempic at last visit.  She tolerates it well.  She lost 17 pounds on it. She recently had a HbA1c of 9.7 >> started iron last month.  Reviewed HbA1c levels: Lab Results  Component Value Date   HGBA1C 5.8 (H) 04/18/2022   HGBA1C 5.9 (A) 11/07/2021   HGBA1C 6.4 (A) 05/08/2021   Pt is on a regimen of: - Metformin 1000 mg twice a day - Jardiance 10 mg daily in am - Januvia 100 mg daily in am >> changed to Ozempic 0.5 mg weekly 10/2021 We stopped Amaryl 2 mg bid and Actos 30 mg daily for daily hypoglycemia events and possible SEs, respectively.  Pt checks her sugars once a day: - am:   83-115, 120 >> 85-112 >> 83-111, 120 >> 87-100 - 2h after b'fast: n/c >> 131 >> n/c >> 114 >> n/c - before lunch: n/c >> 93 >> 100-110 >> n/c - 2h after lunch: 1n/c>> 110, 121 >> n/c >> 93-99 - before dinner: 105, 115 >> n/c >> 102 >> n/c - 2h after dinner: 135, 135 >> n/c - bedtime:   90 >> 70 >> n/c Lowest sugar was 62 >> 83 >> 83 >> 87;  she has hypoglycemia awareness in the 60s. Highest sugar was 140 >> 114 >> 120 >> 100.  -No CKD, last BUN/creatinine:  Lab Results  Component Value Date   BUN 29 (H) 04/18/2022   CREATININE 1.46 (H) 04/18/2022  On quinapril 40.  Normal ACR: Lab Results  Component Value Date   MICRALBCREAT 1.4 06/08/2020   MICRALBCREAT 2.3 06/09/2019   MICRALBCREAT 0.9 06/10/2018   MICRALBCREAT 1.5 01/07/2017   MICRALBCREAT 0.9 05/08/2016   -+ HL; last set of lipids: Lab Results  Component Value Date   CHOL 155 12/18/2021   HDL 56.80 12/18/2021    LDLCALC 86 12/18/2021   LDLDIRECT 146.5 09/18/2012   TRIG 62.0 12/18/2021   CHOLHDL 3 12/18/2021  She had leg cramps with statins: Rosuvastatin, pravastatin, atorvastatin and fluvastatin XL.  She refused to try Zetia.  However, she now does well on Lipitor low-dose every other day (or sometimes 2x a week) and she uses a muscle rub cream for cramps.  - last eye exam was in 08/2021: reportedly + OS PDR, no macular edema; Dr. Edyth Gunnels.  - She denies numbness and tingling in her feet.  Last foot exam 05/08/2021 -here in the office.  She has a history of HTN. She has been considered for lap band surgery >> she did not want to follow through with this. She also has bilateral chronic lower extremity cramps, right knee pain, anemia.  Latest TSH was normal: Lab Results  Component Value Date   TSH 1.75 12/18/2021   ROS: + see HPI  I reviewed pt's medications, allergies, PMH, social hx, family hx, and changes were documented in the history of present illness. Otherwise, unchanged from my initial visit note.  Past Medical History:  Diagnosis Date   Arthritis    HTN (hypertension)    Hyperlipidemia    Obesity    Type II or unspecified type diabetes  mellitus without mention of complication, not stated as uncontrolled    Past Surgical History:  Procedure Laterality Date   ABDOMINAL HYSTERECTOMY  1985   Social History   Socioeconomic History   Marital status: Married    Spouse name: Not on file   Number of children: Not on file   Years of education: Not on file   Highest education level: Not on file  Occupational History   Occupation: [employer]  Tobacco Use   Smoking status: Never   Smokeless tobacco: Never  Vaping Use   Vaping Use: Never used  Substance and Sexual Activity   Alcohol use: No   Drug use: No   Sexual activity: Yes    Partners: Male  Other Topics Concern   Not on file  Social History Narrative   Regular exercise: seldom   Caffeine use: occasionally          Social Determinants of Health   Financial Resource Strain: Not on file  Food Insecurity: Not on file  Transportation Needs: Not on file  Physical Activity: Not on file  Stress: Not on file  Social Connections: Not on file  Intimate Partner Violence: Not on file   Current Outpatient Medications on File Prior to Visit  Medication Sig Dispense Refill   aspirin EC 81 MG tablet Take 81 mg by mouth daily. Swallow whole.     Aspirin-Acetaminophen-Caffeine (GOODY HEADACHE PO) Take 1 Package by mouth daily as needed (headaches).     atorvastatin (LIPITOR) 20 MG tablet TAKE 1 TABLET BY MOUTH ONCE DAILY (Patient taking differently: Take 20 mg by mouth 3 (three) times a week.) 90 tablet 3   cephALEXin (KEFLEX) 500 MG capsule Take 1 capsule (500 mg total) by mouth 3 (three) times daily. (Patient not taking: Reported on 04/13/2022) 15 capsule 0   cetirizine (ZYRTEC) 10 MG tablet TAKE ONE TABLET BY MOUTH ONCE DAILY (Patient not taking: Reported on 04/13/2022) 70 tablet 5   Cholecalciferol 1000 UNITS tablet Take 1,000 Units by mouth daily.     Diclofenac Sodium 2 % SOLN Apply 1 pump twice daily. (Patient not taking: Reported on 04/13/2022) 112 g 3   empagliflozin (JARDIANCE) 10 MG TABS tablet Take 1 tablet (10 mg total) by mouth daily before breakfast. 30 tablet 5   ibuprofen (ADVIL,MOTRIN) 600 MG tablet TAKE 1 TABLET BY MOUTH TWICE DAILY AFTER MEALS FOR 1 WEEK, THEN AS NEEDED FOR PAIN (Patient not taking: Reported on 04/13/2022) 60 tablet 2   Iron, Ferrous Sulfate, 325 (65 Fe) MG TABS Take 1 tablet by mouth daily. 30 tablet 3   lisinopril (ZESTRIL) 40 MG tablet Take 1 tablet (40 mg total) by mouth daily. 90 tablet 3   loratadine (CLARITIN) 10 MG tablet Take 1 tablet (10 mg total) by mouth daily. (Patient not taking: Reported on 04/13/2022) 90 tablet 0   Menthol, Topical Analgesic, (BIOFREEZE) 4 % GEL Apply to affected area daily as needed (Patient not taking: Reported on 04/13/2022) 150 mL 0   metFORMIN  (GLUCOPHAGE) 1000 MG tablet TAKE 1 TABLET BY MOUTH 2 TIMES DAILY WITH A MEAL 180 tablet 3   Semaglutide,0.25 or 0.'5MG'$ /DOS, (OZEMPIC, 0.25 OR 0.5 MG/DOSE,) 2 MG/3ML SOPN Inject 0.5 mg into the skin once a week. 9 mL 3   sitaGLIPtin (JANUVIA) 100 MG tablet TAKE 1 TABLET BY MOUTH ONCE DAILY IN THE MORNING (Patient not taking: Reported on 04/13/2022) 90 tablet 3   spironolactone (ALDACTONE) 50 MG tablet Take 1 tablet (50 mg total) by  mouth daily. 90 tablet 3   traMADol (ULTRAM) 50 MG tablet Take 1 tablet (50 mg total) by mouth 4 (four) times daily. (Patient taking differently: Take 50 mg by mouth every 6 (six) hours as needed for moderate pain.) 100 tablet 2   verapamil (VERELAN PM) 240 MG 24 hr capsule Take 1 capsule (240 mg total) by mouth at bedtime. 90 capsule 3   No current facility-administered medications on file prior to visit.   Allergies  Allergen Reactions   Atorvastatin     REACTION: cramps   Hydrochlorothiazide     REACTION: Cramps   Pravastatin     cramps   Family History  Problem Relation Age of Onset   Hypertension Mother    Diabetes Mother    Hypertension Father    Hypertension Other    Colon cancer Neg Hx    Esophageal cancer Neg Hx    Rectal cancer Neg Hx    Stomach cancer Neg Hx    PE: BP 130/72 (BP Location: Right Arm, Patient Position: Sitting, Cuff Size: Normal)   Pulse 62   Ht 5' 4.5" (1.638 m)   Wt 186 lb (84.4 kg)   SpO2 95%   BMI 31.43 kg/m    Wt Readings from Last 3 Encounters:  05/11/22 186 lb (84.4 kg)  04/18/22 183 lb (83 kg)  12/18/21 192 lb (87.1 kg)   Constitutional: overweight, in NAD Eyes: EOMI, no exophthalmos ENT:  no thyromegaly, no cervical lymphadenopathy Cardiovascular: RRR, No MRG, + mild peri ankle swelling bilaterally, pitting Respiratory: CTA B Musculoskeletal: no deformities Skin:  no rashes  Neurological: no tremor with outstretched hands Diabetic Foot Exam - Simple   Simple Foot Form Diabetic Foot exam was performed with  the following findings: Yes 05/11/2022  9:09 AM  Visual Inspection No deformities, no ulcerations, no other skin breakdown bilaterally: Yes Sensation Testing Intact to touch and monofilament testing bilaterally: Yes Pulse Check Posterior Tibialis and Dorsalis pulse intact bilaterally: Yes Comments    Assessment: 1. 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 Hyperlipidemia - Developed leg cramps with pravastatin, atorvastatin, Crestor, fluvastatin  3.  Obesity class II  4. Goiter by palpation 11/04/2012: Thyroid U/S: Right thyroid lobe: 4.3 x 1.0 x 1.8 cm  Left thyroid lobe: 4.6 x 1.2 x 1.9 cm  Isthmus: 5 mm  Focal nodules: Thyroid echotexture is homogeneous, without solid or cystic lesion.  Lymphadenopathy: None visualized.  IMPRESSION:  Normal thyroid ultrasound. No evidence of thyromegaly or dominant solid/cystic mass.  -No further intervention needed for this  PLAN:  1. DM2 -Patient with longstanding, well-controlled, type 2 diabetes, on metformin, SGLT2 inhibitor, and DPP 4 at last visit, but changed to a GLP-1 receptor agonist at that time per her request.  HbA1c was 5.9%, slightly lower.  Sugars were at goal in the morning.  I did advise him to rotate blood sugar checks throughout the day. -Since last visit, she had another HbA1c which was even lower, at 5.8%, last month -At today's visit, sugars are excellent, under 100.  She is interested in stopping metformin.  Based on the excellent diabetes control, I advised her to further reduce the dose and then stop if sugars remain controlled.  Will continue the same doses of Jardiance and Ozempic. - I suggested to:  Patient Instructions  Please decrease: - Metformin 1000 mg 1x a day  (may stop after 2 weeks if sugars remain at goal)  Continue: - Jardianec 10 mg in am -  Ozempic 0.5 mg weekly  Please come back for a follow-up appointment in 6 months.  - advised to check sugars at different times of  the day - 1x a day, rotating check times - advised for yearly eye exams >> she is UTD - return to clinic in 6 months  2. Hyperlipidemia -Reviewed latest lipid panel from 12/2021: LDL above target, otherwise fractions at goal: Lab Results  Component Value Date   CHOL 155 12/18/2021   HDL 56.80 12/18/2021   LDLCALC 86 12/18/2021   LDLDIRECT 146.5 09/18/2012   TRIG 62.0 12/18/2021   CHOLHDL 3 12/18/2021  -She had leg cramps from low-dose Crestor, pravastatin, atorvastatin, fluvastatin XL.  She is now about to tolerate Lipitor 10 mg every 2 to 3 days.  3.  Obesity class II -Will continue GLP-1 receptor agonist which should also help with weight loss -Weight was stable at last visit, but she lost 17 pounds since then  Philemon Kingdom, MD PhD [facility] Endocrinology

## 2022-06-14 ENCOUNTER — Other Ambulatory Visit (HOSPITAL_COMMUNITY): Payer: Self-pay

## 2022-06-19 ENCOUNTER — Encounter: Payer: Self-pay | Admitting: Internal Medicine

## 2022-06-19 ENCOUNTER — Ambulatory Visit: Payer: Commercial Managed Care - PPO | Admitting: Internal Medicine

## 2022-06-19 VITALS — BP 110/70 | HR 70 | Temp 97.6°F | Ht 64.5 in | Wt 182.0 lb

## 2022-06-19 DIAGNOSIS — M25561 Pain in right knee: Secondary | ICD-10-CM

## 2022-06-19 DIAGNOSIS — R202 Paresthesia of skin: Secondary | ICD-10-CM

## 2022-06-19 DIAGNOSIS — D649 Anemia, unspecified: Secondary | ICD-10-CM | POA: Diagnosis not present

## 2022-06-19 DIAGNOSIS — E113592 Type 2 diabetes mellitus with proliferative diabetic retinopathy without macular edema, left eye: Secondary | ICD-10-CM

## 2022-06-19 DIAGNOSIS — G8929 Other chronic pain: Secondary | ICD-10-CM | POA: Diagnosis not present

## 2022-06-19 DIAGNOSIS — N183 Chronic kidney disease, stage 3 unspecified: Secondary | ICD-10-CM | POA: Diagnosis not present

## 2022-06-19 DIAGNOSIS — K635 Polyp of colon: Secondary | ICD-10-CM

## 2022-06-19 LAB — VITAMIN B12: Vitamin B-12: 205 pg/mL — ABNORMAL LOW (ref 211–911)

## 2022-06-19 LAB — CBC WITH DIFFERENTIAL/PLATELET
Basophils Absolute: 0 10*3/uL (ref 0.0–0.1)
Basophils Relative: 0.5 % (ref 0.0–3.0)
Eosinophils Absolute: 0.1 10*3/uL (ref 0.0–0.7)
Eosinophils Relative: 1.6 % (ref 0.0–5.0)
HCT: 35.4 % — ABNORMAL LOW (ref 36.0–46.0)
Hemoglobin: 11.5 g/dL — ABNORMAL LOW (ref 12.0–15.0)
Lymphocytes Relative: 34.9 % (ref 12.0–46.0)
Lymphs Abs: 2 10*3/uL (ref 0.7–4.0)
MCHC: 32.6 g/dL (ref 30.0–36.0)
MCV: 89.5 fl (ref 78.0–100.0)
Monocytes Absolute: 0.4 10*3/uL (ref 0.1–1.0)
Monocytes Relative: 7.6 % (ref 3.0–12.0)
Neutro Abs: 3.2 10*3/uL (ref 1.4–7.7)
Neutrophils Relative %: 55.4 % (ref 43.0–77.0)
Platelets: 260 10*3/uL (ref 150.0–400.0)
RBC: 3.96 Mil/uL (ref 3.87–5.11)
RDW: 18.6 % — ABNORMAL HIGH (ref 11.5–15.5)
WBC: 5.7 10*3/uL (ref 4.0–10.5)

## 2022-06-19 LAB — COMPREHENSIVE METABOLIC PANEL
ALT: 11 U/L (ref 0–35)
AST: 12 U/L (ref 0–37)
Albumin: 4 g/dL (ref 3.5–5.2)
Alkaline Phosphatase: 77 U/L (ref 39–117)
BUN: 31 mg/dL — ABNORMAL HIGH (ref 6–23)
CO2: 25 mEq/L (ref 19–32)
Calcium: 9.3 mg/dL (ref 8.4–10.5)
Chloride: 103 mEq/L (ref 96–112)
Creatinine, Ser: 1.42 mg/dL — ABNORMAL HIGH (ref 0.40–1.20)
GFR: 35.99 mL/min — ABNORMAL LOW (ref >60.00)
Glucose, Bld: 79 mg/dL (ref 70–99)
Potassium: 5 mEq/L (ref 3.5–5.1)
Sodium: 136 mEq/L (ref 135–145)
Total Bilirubin: 0.3 mg/dL (ref 0.2–1.2)
Total Protein: 7.1 g/dL (ref 6.0–8.3)

## 2022-06-19 NOTE — Assessment & Plan Note (Addendum)
GFR 37 Hydrate better Nephrology ref - pt declined On Jardiance, ACE

## 2022-06-19 NOTE — Progress Notes (Signed)
Subjective:  Patient ID: Katelyn Peterson, female    DOB: [birth year]  Age: 77 y.o. MRN: [id number]  CC: Follow-up (6 mnth f/u)   HPI Katelyn Peterson presents for R knee OA, low Hgb, DM, CRF  Outpatient Medications Prior to Visit  Medication Sig Dispense Refill   aspirin EC 81 MG tablet Take 81 mg by mouth daily. Swallow whole.     Aspirin-Acetaminophen-Caffeine (GOODY HEADACHE PO) Take 1 Package by mouth daily as needed (headaches).     atorvastatin (LIPITOR) 20 MG tablet TAKE 1 TABLET BY MOUTH ONCE DAILY (Patient taking differently: Take 20 mg by mouth 3 (three) times a week.) 90 tablet 3   cetirizine (ZYRTEC) 10 MG tablet TAKE ONE TABLET BY MOUTH ONCE DAILY (Patient not taking: Reported on 04/13/2022) 70 tablet 5   Cholecalciferol 1000 UNITS tablet Take 1,000 Units by mouth daily.     Diclofenac Sodium 2 % SOLN Apply 1 pump twice daily. (Patient not taking: Reported on 04/13/2022) 112 g 3   empagliflozin (JARDIANCE) 10 MG TABS tablet Take 1 tablet (10 mg total) by mouth daily before breakfast. 30 tablet 5   Iron, Ferrous Sulfate, 325 (65 Fe) MG TABS Take 1 tablet by mouth daily. 30 tablet 3   lisinopril (ZESTRIL) 40 MG tablet Take 1 tablet (40 mg total) by mouth daily. 90 tablet 3   loratadine (CLARITIN) 10 MG tablet Take 1 tablet (10 mg total) by mouth daily. (Patient not taking: Reported on 04/13/2022) 90 tablet 0   Menthol, Topical Analgesic, (BIOFREEZE) 4 % GEL Apply to affected area daily as needed (Patient not taking: Reported on 04/13/2022) 150 mL 0   metFORMIN (GLUCOPHAGE) 1000 MG tablet Take 1 tablet (1,000 mg total) by mouth daily. 90 tablet 3   Semaglutide,0.25 or 0.5MG /DOS, (OZEMPIC, 0.25 OR 0.5 MG/DOSE,) 2 MG/3ML SOPN Inject 0.5 mg into the skin once a week. 9 mL 3   spironolactone (ALDACTONE) 50 MG tablet Take 1 tablet (50 mg total) by mouth daily. 90 tablet 3   traMADol (ULTRAM) 50 MG tablet Take 1 tablet (50 mg total) by mouth 4 (four) times daily. (Patient taking  differently: Take 50 mg by mouth every 6 (six) hours as needed for moderate pain.) 100 tablet 2   verapamil (VERELAN PM) 240 MG 24 hr capsule Take 1 capsule (240 mg total) by mouth at bedtime. 90 capsule 3   cephALEXin (KEFLEX) 500 MG capsule Take 1 capsule (500 mg total) by mouth 3 (three) times daily. (Patient not taking: Reported on 04/13/2022) 15 capsule 0   ibuprofen (ADVIL,MOTRIN) 600 MG tablet TAKE 1 TABLET BY MOUTH TWICE DAILY AFTER MEALS FOR 1 WEEK, THEN AS NEEDED FOR PAIN (Patient not taking: Reported on 04/13/2022) 60 tablet 2   No facility-administered medications prior to visit.    ROS: Review of Systems  Constitutional:  Negative for activity change, appetite change, chills, fatigue and unexpected weight change.  HENT:  Negative for congestion, mouth sores and sinus pressure.   Eyes:  Negative for visual disturbance.  Respiratory:  Negative for cough and chest tightness.   Gastrointestinal:  Negative for abdominal pain and nausea.  Genitourinary:  Negative for difficulty urinating, frequency and vaginal pain.  Musculoskeletal:  Positive for arthralgias. Negative for back pain and gait problem.  Skin:  Negative for pallor and rash.  Neurological:  Negative for dizziness, tremors, weakness, numbness and headaches.  Psychiatric/Behavioral:  Negative for confusion and sleep disturbance.     Objective:  BP 110/70 (BP  Location: Left Arm, Patient Position: Sitting, Cuff Size: Large)   Pulse 70   Temp 97.6 F (36.4 C) (Oral)   Ht 5' 4.5" (1.638 m)   Wt 182 lb (82.6 kg)   SpO2 94%   BMI 30.76 kg/m   BP Readings from Last 3 Encounters:  06/19/22 110/70  05/11/22 130/72  04/18/22 (!) 150/68    Wt Readings from Last 3 Encounters:  06/19/22 182 lb (82.6 kg)  05/11/22 186 lb (84.4 kg)  04/18/22 183 lb (83 kg)    Physical Exam Constitutional:      General: She is not in acute distress.    Appearance: She is well-developed.  HENT:     Head: Normocephalic.     Right Ear:  External ear normal.     Left Ear: External ear normal.     Nose: Nose normal.  Eyes:     General:        Right eye: No discharge.        Left eye: No discharge.     Conjunctiva/sclera: Conjunctivae normal.     Pupils: Pupils are equal, round, and reactive to light.  Neck:     Thyroid: No thyromegaly.     Vascular: No JVD.     Trachea: No tracheal deviation.  Cardiovascular:     Rate and Rhythm: Normal rate and regular rhythm.     Heart sounds: Normal heart sounds.  Pulmonary:     Effort: No respiratory distress.     Breath sounds: No stridor. No wheezing.  Abdominal:     General: Bowel sounds are normal. There is no distension.     Palpations: Abdomen is soft. There is no mass.     Tenderness: There is no abdominal tenderness. There is no guarding or rebound.  Musculoskeletal:        General: No tenderness.     Cervical back: Normal range of motion and neck supple. No rigidity.  Lymphadenopathy:     Cervical: No cervical adenopathy.  Skin:    Findings: No erythema or rash.  Neurological:     Mental Status: She is oriented to person, place, and time.     Cranial Nerves: No cranial nerve deficit.     Motor: No abnormal muscle tone.     Coordination: Coordination normal.     Gait: Gait abnormal.     Deep Tendon Reflexes: Reflexes normal.  Psychiatric:        Behavior: Behavior normal.        Thought Content: Thought content normal.        Judgment: Judgment normal.   R knee w/pain    A total time of 45 minutes was spent preparing to see the patient, reviewing tests, x-rays, operative reports and other medical records.  Also, obtaining history and performing comprehensive physical exam.  Additionally, counseling the patient regarding the above listed issues.   Finally, documenting clinical information in the health records, coordination of care, educating the patient - anemia and CRI. It is a complex case.   Lab Results  Component Value Date   WBC 6.1 04/18/2022   HGB  9.1 (L) 04/18/2022   HCT 30.0 (L) 04/18/2022   PLT 312 04/18/2022   GLUCOSE 97 04/18/2022   CHOL 155 12/18/2021   TRIG 62.0 12/18/2021   HDL 56.80 12/18/2021   LDLDIRECT 146.5 09/18/2012   LDLCALC 86 12/18/2021   ALT 10 12/18/2021   AST 12 12/18/2021   NA 139 04/18/2022   K 5.2 (  H) 04/18/2022   CL 108 04/18/2022   CREATININE 1.46 (H) 04/18/2022   BUN 29 (H) 04/18/2022   CO2 22 04/18/2022   TSH 1.75 12/18/2021   HGBA1C 5.8 (H) 04/18/2022   MICROALBUR 1.8 06/08/2020    No results found.  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)    Cont on Metformin, Jardiance,  Lipitor, Ozempic      Relevant Orders   Comprehensive metabolic panel     Genitourinary   CRI (chronic renal insufficiency), stage 3 (moderate)    GFR 37 Hydrate better Nephrology ref - pt declined On Jardiance, ACE       Relevant Orders   CBC with Differential/Platelet   Iron, TIBC and Ferritin Panel   Vitamin B12   Comprehensive metabolic panel     Other   Anemia - Primary    Probably due to CRF, less likely iron defciency On empiric iron po  Hematology ref GI ref      Relevant Orders   Ambulatory referral to Gastroenterology   Ambulatory referral to Hematology / Oncology   CBC with Differential/Platelet   Iron, TIBC and Ferritin Panel   Vitamin B12   Knee pain, right    Surgery is delayed due to anemia      Other Visit Diagnoses     Paresthesia       Relevant Orders   Vitamin B12         No orders of the defined types were placed in this encounter.     Follow-up: Return in about 3 months (around 09/18/2022) for a follow-up visit.  Sonda PrimesAlex Hyder Deman, MD

## 2022-06-19 NOTE — Assessment & Plan Note (Signed)
GI ref 

## 2022-06-19 NOTE — Assessment & Plan Note (Signed)
Surgery is delayed due to anemia

## 2022-06-19 NOTE — Assessment & Plan Note (Signed)
Cont on Metformin, Jardiance,  Lipitor, Ozempic

## 2022-06-19 NOTE — Assessment & Plan Note (Signed)
Probably due to CRF, less likely iron defciency On empiric iron po  Hematology ref GI ref

## 2022-06-20 ENCOUNTER — Other Ambulatory Visit: Payer: Self-pay | Admitting: Internal Medicine

## 2022-06-20 ENCOUNTER — Other Ambulatory Visit (HOSPITAL_COMMUNITY): Payer: Self-pay

## 2022-06-20 ENCOUNTER — Telehealth: Payer: Self-pay | Admitting: Internal Medicine

## 2022-06-20 ENCOUNTER — Encounter: Payer: Self-pay | Admitting: Internal Medicine

## 2022-06-20 DIAGNOSIS — E538 Deficiency of other specified B group vitamins: Secondary | ICD-10-CM | POA: Insufficient documentation

## 2022-06-20 LAB — IRON,TIBC AND FERRITIN PANEL
%SAT: 19 % (calc) (ref 16–45)
Ferritin: 16 ng/mL (ref 16–288)
Iron: 71 ug/dL (ref 45–160)
TIBC: 373 mcg/dL (calc) (ref 250–450)

## 2022-06-20 MED ORDER — CYANOCOBALAMIN 1000 MCG/ML IJ SOLN
INTRAMUSCULAR | 6 refills | Status: DC
  Filled 2022-06-20: qty 10, 28d supply, fill #0
  Filled 2022-07-09: qty 10, 28d supply, fill #1
  Filled 2022-08-27: qty 10, 28d supply, fill #2

## 2022-06-20 NOTE — Telephone Encounter (Signed)
scheduled per 4/10 referral , pt has been called and confirmed date and time. Pt is aware of location and to arrive early for check in   

## 2022-06-25 ENCOUNTER — Telehealth: Payer: Self-pay | Admitting: *Deleted

## 2022-06-25 NOTE — Telephone Encounter (Signed)
[  9:28 AM] Katelyn Peterson, Wanted to let MD know... I call GI and she stated that I am not due for a colonoscopy until June of 25 and the Insurance will not pay for one before that because it is already set up.

## 2022-06-27 NOTE — Telephone Encounter (Signed)
We referred patient for a GI consultation, not for a colonoscopy.  Her new problem is anemia.  Please resend the referral.  Thanks

## 2022-06-27 NOTE — Telephone Encounter (Signed)
Notified patient referral already been placed...Katelyn Peterson

## 2022-07-06 ENCOUNTER — Telehealth: Payer: Self-pay | Admitting: Internal Medicine

## 2022-07-06 NOTE — Telephone Encounter (Signed)
Patient called to cancel her NP appointment coming up she doesn't want to reschedule at this time.

## 2022-07-09 ENCOUNTER — Other Ambulatory Visit (HOSPITAL_COMMUNITY): Payer: Self-pay

## 2022-07-11 ENCOUNTER — Inpatient Hospital Stay: Payer: Commercial Managed Care - PPO | Admitting: Internal Medicine

## 2022-07-11 ENCOUNTER — Other Ambulatory Visit (HOSPITAL_COMMUNITY): Payer: Self-pay

## 2022-07-11 ENCOUNTER — Inpatient Hospital Stay: Payer: Commercial Managed Care - PPO

## 2022-07-12 ENCOUNTER — Other Ambulatory Visit (HOSPITAL_COMMUNITY): Payer: Self-pay

## 2022-07-20 ENCOUNTER — Encounter: Payer: Self-pay | Admitting: Physician Assistant

## 2022-07-30 ENCOUNTER — Other Ambulatory Visit (HOSPITAL_COMMUNITY): Payer: Self-pay

## 2022-08-03 DIAGNOSIS — M1711 Unilateral primary osteoarthritis, right knee: Secondary | ICD-10-CM | POA: Diagnosis not present

## 2022-08-27 ENCOUNTER — Other Ambulatory Visit (HOSPITAL_COMMUNITY): Payer: Self-pay

## 2022-08-28 ENCOUNTER — Other Ambulatory Visit (HOSPITAL_COMMUNITY): Payer: Self-pay

## 2022-08-29 ENCOUNTER — Other Ambulatory Visit (HOSPITAL_COMMUNITY): Payer: Self-pay

## 2022-08-30 ENCOUNTER — Other Ambulatory Visit (HOSPITAL_COMMUNITY): Payer: Self-pay

## 2022-09-05 ENCOUNTER — Other Ambulatory Visit (HOSPITAL_COMMUNITY): Payer: Self-pay

## 2022-09-06 ENCOUNTER — Other Ambulatory Visit (HOSPITAL_COMMUNITY): Payer: Self-pay

## 2022-09-11 ENCOUNTER — Other Ambulatory Visit: Payer: Self-pay | Admitting: Internal Medicine

## 2022-09-11 ENCOUNTER — Other Ambulatory Visit (HOSPITAL_COMMUNITY): Payer: Self-pay

## 2022-09-11 MED ORDER — METFORMIN HCL 1000 MG PO TABS
1000.0000 mg | ORAL_TABLET | Freq: Two times a day (BID) | ORAL | 3 refills | Status: DC
  Filled 2022-09-11: qty 180, 90d supply, fill #0

## 2022-09-17 DIAGNOSIS — H524 Presbyopia: Secondary | ICD-10-CM | POA: Diagnosis not present

## 2022-09-17 DIAGNOSIS — H2513 Age-related nuclear cataract, bilateral: Secondary | ICD-10-CM | POA: Diagnosis not present

## 2022-09-17 DIAGNOSIS — H25013 Cortical age-related cataract, bilateral: Secondary | ICD-10-CM | POA: Diagnosis not present

## 2022-09-17 DIAGNOSIS — H52203 Unspecified astigmatism, bilateral: Secondary | ICD-10-CM | POA: Diagnosis not present

## 2022-09-17 DIAGNOSIS — E113293 Type 2 diabetes mellitus with mild nonproliferative diabetic retinopathy without macular edema, bilateral: Secondary | ICD-10-CM | POA: Diagnosis not present

## 2022-09-17 DIAGNOSIS — E119 Type 2 diabetes mellitus without complications: Secondary | ICD-10-CM | POA: Diagnosis not present

## 2022-09-17 DIAGNOSIS — H25043 Posterior subcapsular polar age-related cataract, bilateral: Secondary | ICD-10-CM | POA: Diagnosis not present

## 2022-09-17 DIAGNOSIS — Z7984 Long term (current) use of oral hypoglycemic drugs: Secondary | ICD-10-CM | POA: Diagnosis not present

## 2022-09-17 DIAGNOSIS — H5203 Hypermetropia, bilateral: Secondary | ICD-10-CM | POA: Diagnosis not present

## 2022-09-18 ENCOUNTER — Ambulatory Visit: Payer: Commercial Managed Care - PPO | Admitting: Internal Medicine

## 2022-09-19 ENCOUNTER — Other Ambulatory Visit (HOSPITAL_COMMUNITY): Payer: Self-pay

## 2022-09-19 ENCOUNTER — Ambulatory Visit: Payer: Commercial Managed Care - PPO | Admitting: Internal Medicine

## 2022-09-19 ENCOUNTER — Encounter: Payer: Self-pay | Admitting: Internal Medicine

## 2022-09-19 VITALS — BP 132/72 | HR 75 | Temp 98.6°F | Ht 64.5 in | Wt 184.0 lb

## 2022-09-19 DIAGNOSIS — E113592 Type 2 diabetes mellitus with proliferative diabetic retinopathy without macular edema, left eye: Secondary | ICD-10-CM

## 2022-09-19 DIAGNOSIS — M25561 Pain in right knee: Secondary | ICD-10-CM

## 2022-09-19 DIAGNOSIS — D649 Anemia, unspecified: Secondary | ICD-10-CM | POA: Diagnosis not present

## 2022-09-19 DIAGNOSIS — Z7984 Long term (current) use of oral hypoglycemic drugs: Secondary | ICD-10-CM | POA: Diagnosis not present

## 2022-09-19 DIAGNOSIS — E538 Deficiency of other specified B group vitamins: Secondary | ICD-10-CM

## 2022-09-19 LAB — COMPREHENSIVE METABOLIC PANEL
ALT: 15 U/L (ref 0–35)
AST: 12 U/L (ref 0–37)
Albumin: 3.9 g/dL (ref 3.5–5.2)
Alkaline Phosphatase: 91 U/L (ref 39–117)
BUN: 21 mg/dL (ref 6–23)
CO2: 26 mEq/L (ref 19–32)
Calcium: 9.8 mg/dL (ref 8.4–10.5)
Chloride: 107 mEq/L (ref 96–112)
Creatinine, Ser: 1.32 mg/dL — ABNORMAL HIGH (ref 0.40–1.20)
GFR: 39.22 mL/min — ABNORMAL LOW (ref >60.00)
Glucose, Bld: 91 mg/dL (ref 70–99)
Potassium: 5.2 mEq/L — ABNORMAL HIGH (ref 3.5–5.1)
Sodium: 141 mEq/L (ref 135–145)
Total Bilirubin: 0.5 mg/dL (ref 0.2–1.2)
Total Protein: 7.2 g/dL (ref 6.0–8.3)

## 2022-09-19 LAB — CBC WITH DIFFERENTIAL/PLATELET
Basophils Absolute: 0 10*3/uL (ref 0.0–0.1)
Basophils Relative: 0.8 % (ref 0.0–3.0)
Eosinophils Absolute: 0.1 10*3/uL (ref 0.0–0.7)
Eosinophils Relative: 1.4 % (ref 0.0–5.0)
HCT: 34.7 % — ABNORMAL LOW (ref 36.0–46.0)
Hemoglobin: 11.4 g/dL — ABNORMAL LOW (ref 12.0–15.0)
Lymphocytes Relative: 31.7 % (ref 12.0–46.0)
Lymphs Abs: 1.6 10*3/uL (ref 0.7–4.0)
MCHC: 32.9 g/dL (ref 30.0–36.0)
MCV: 93.1 fl (ref 78.0–100.0)
Monocytes Absolute: 0.4 10*3/uL (ref 0.1–1.0)
Monocytes Relative: 7.4 % (ref 3.0–12.0)
Neutro Abs: 3 10*3/uL (ref 1.4–7.7)
Neutrophils Relative %: 58.7 % (ref 43.0–77.0)
Platelets: 261 10*3/uL (ref 150.0–400.0)
RBC: 3.73 Mil/uL — ABNORMAL LOW (ref 3.87–5.11)
RDW: 13.7 % (ref 11.5–15.5)
WBC: 5.1 10*3/uL (ref 4.0–10.5)

## 2022-09-19 LAB — VITAMIN B12: Vitamin B-12: >1500 pg/mL — ABNORMAL HIGH (ref 211–911)

## 2022-09-19 LAB — HEMOGLOBIN A1C: Hgb A1c MFr Bld: 6.2 % (ref 4.6–6.5)

## 2022-09-19 MED ORDER — VITAMIN B-12 1000 MCG SL SUBL
1.0000 | SUBLINGUAL_TABLET | Freq: Every day | SUBLINGUAL | 3 refills | Status: DC
  Filled 2022-09-19: qty 100, 100d supply, fill #0

## 2022-09-19 NOTE — Progress Notes (Addendum)
Subjective:  Patient ID: Katelyn Peterson, female    DOB: [birth year]  Age: 77 y.o. MRN: [id number]  CC: Follow-up (3 mnth f/u)   HPI Katelyn Peterson presents for HTN, DM, dyslipidemia, CRI, B 12 def, anemia F/u anemia  Outpatient Medications Prior to Visit  Medication Sig Dispense Refill   aspirin EC 81 MG tablet Take 81 mg by mouth daily. Swallow whole.     Aspirin-Acetaminophen-Caffeine (GOODY HEADACHE PO) Take 1 Package by mouth daily as needed (headaches).     atorvastatin (LIPITOR) 20 MG tablet TAKE 1 TABLET BY MOUTH ONCE DAILY (Patient taking differently: Take 20 mg by mouth 3 (three) times a week.) 90 tablet 3   Cholecalciferol 1000 UNITS tablet Take 1,000 Units by mouth daily.     empagliflozin (JARDIANCE) 10 MG TABS tablet Take 1 tablet (10 mg total) by mouth daily before breakfast. 30 tablet 5   Iron, Ferrous Sulfate, 325 (65 Fe) MG TABS Take 1 tablet by mouth daily. 30 tablet 3   lisinopril (ZESTRIL) 40 MG tablet Take 1 tablet (40 mg total) by mouth daily. 90 tablet 3   metFORMIN (GLUCOPHAGE) 1000 MG tablet Take 1 tablet (1,000 mg total) by mouth daily. 90 tablet 3   metFORMIN (GLUCOPHAGE) 1000 MG tablet Take 1 tablet (1,000 mg total) by mouth 2 (two) times daily with a meal. 180 tablet 3   Semaglutide,0.25 or 0.5MG /DOS, (OZEMPIC, 0.25 OR 0.5 MG/DOSE,) 2 MG/3ML SOPN Inject 0.5 mg into the skin once a week. 9 mL 3   spironolactone (ALDACTONE) 50 MG tablet Take 1 tablet (50 mg total) by mouth daily. 90 tablet 3   traMADol (ULTRAM) 50 MG tablet Take 1 tablet (50 mg total) by mouth 4 (four) times daily. (Patient taking differently: Take 50 mg by mouth every 6 (six) hours as needed for moderate pain.) 100 tablet 2   verapamil (VERELAN) 240 MG 24 hr capsule Take 1 capsule (240 mg total) by mouth at bedtime. 90 capsule 3   cyanocobalamin (VITAMIN B12) 1000 MCG/ML injection Inject 1 ml subcutaneously daily x 1 week, then weekly for 1 month, then once every 2 weeks 10 mL 6    cetirizine (ZYRTEC) 10 MG tablet TAKE ONE TABLET BY MOUTH ONCE DAILY (Patient not taking: Reported on 04/13/2022) 70 tablet 5   Diclofenac Sodium 2 % SOLN Apply 1 pump twice daily. (Patient not taking: Reported on 04/13/2022) 112 g 3   loratadine (CLARITIN) 10 MG tablet Take 1 tablet (10 mg total) by mouth daily. (Patient not taking: Reported on 04/13/2022) 90 tablet 0   Menthol, Topical Analgesic, (BIOFREEZE) 4 % GEL Apply to affected area daily as needed (Patient not taking: Reported on 04/13/2022) 150 mL 0   No facility-administered medications prior to visit.    ROS: Review of Systems  Constitutional:  Negative for activity change, appetite change, chills, fatigue and unexpected weight change.  HENT:  Negative for congestion, mouth sores and sinus pressure.   Eyes:  Negative for visual disturbance.  Respiratory:  Negative for cough and chest tightness.   Gastrointestinal:  Negative for abdominal pain and nausea.  Genitourinary:  Negative for difficulty urinating, frequency and vaginal pain.  Musculoskeletal:  Positive for arthralgias. Negative for back pain and gait problem.  Skin:  Negative for pallor and rash.  Neurological:  Negative for dizziness, tremors, weakness, numbness and headaches.  Psychiatric/Behavioral:  Negative for confusion and sleep disturbance.     Objective:  BP 132/72 (BP Location: Left Arm, Patient Position:  Sitting, Cuff Size: Large)   Pulse 75   Temp 98.6 F (37 C) (Oral)   Ht 5' 4.5" (1.638 m)   Wt 184 lb (83.5 kg)   SpO2 96%   BMI 31.10 kg/m   BP Readings from Last 3 Encounters:  09/19/22 132/72  06/19/22 110/70  05/11/22 130/72    Wt Readings from Last 3 Encounters:  09/19/22 184 lb (83.5 kg)  06/19/22 182 lb (82.6 kg)  05/11/22 186 lb (84.4 kg)    Physical Exam Constitutional:      General: She is not in acute distress.    Appearance: She is well-developed. She is obese.  HENT:     Head: Normocephalic.     Right Ear: External ear normal.      Left Ear: External ear normal.     Nose: Nose normal.  Eyes:     General:        Right eye: No discharge.        Left eye: No discharge.     Conjunctiva/sclera: Conjunctivae normal.     Pupils: Pupils are equal, round, and reactive to light.  Neck:     Thyroid: No thyromegaly.     Vascular: No JVD.     Trachea: No tracheal deviation.  Cardiovascular:     Rate and Rhythm: Normal rate and regular rhythm.     Heart sounds: Normal heart sounds.  Pulmonary:     Effort: No respiratory distress.     Breath sounds: No stridor. No wheezing.  Abdominal:     General: Bowel sounds are normal. There is no distension.     Palpations: Abdomen is soft. There is no mass.     Tenderness: There is no abdominal tenderness. There is no guarding or rebound.  Musculoskeletal:        General: No tenderness.     Cervical back: Normal range of motion and neck supple. No rigidity.  Lymphadenopathy:     Cervical: No cervical adenopathy.  Skin:    Findings: No erythema or rash.  Neurological:     Cranial Nerves: No cranial nerve deficit.     Motor: No abnormal muscle tone.     Coordination: Coordination normal.     Deep Tendon Reflexes: Reflexes normal.  Psychiatric:        Behavior: Behavior normal.        Thought Content: Thought content normal.        Judgment: Judgment normal.   Limping a little  Lab Results  Component Value Date   WBC 5.7 06/19/2022   HGB 11.5 (L) 06/19/2022   HCT 35.4 (L) 06/19/2022   PLT 260.0 06/19/2022   GLUCOSE 79 06/19/2022   CHOL 155 12/18/2021   TRIG 62.0 12/18/2021   HDL 56.80 12/18/2021   LDLDIRECT 146.5 09/18/2012   LDLCALC 86 12/18/2021   ALT 11 06/19/2022   AST 12 06/19/2022   NA 136 06/19/2022   K 5.0 06/19/2022   CL 103 06/19/2022   CREATININE 1.42 (H) 06/19/2022   BUN 31 (H) 06/19/2022   CO2 25 06/19/2022   TSH 1.75 12/18/2021   HGBA1C 5.8 (H) 04/18/2022   MICROALBUR 1.8 06/08/2020    No results found.  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) (Chronic)    Cont on Metformin, Jardiance,  Lipitor, Ozonic Check A1c      Relevant Orders  Hemoglobin A1c   KNEE PAIN - Primary    Waiting for surgery - postponed due to anemia      Relevant Orders   CBC with Differential/Platelet   Comprehensive metabolic panel   Iron, TIBC and Ferritin Panel   Anemia    On empiric iron po  Hematology ref GI ref      Relevant Medications   Cyanocobalamin (VITAMIN B-12) 1000 MCG SUBL   Other Relevant Orders   CBC with Differential/Platelet   Comprehensive metabolic panel   Iron, TIBC and Ferritin Panel   B12 deficiency     On vitamin B12 injections subcutaneously Check labs Start SL  B12      Relevant Orders   Vitamin B12      Meds ordered this encounter  Medications   Cyanocobalamin (VITAMIN B-12) 1000 MCG SUBL    Sig: Place 1 tablet (1,000 mcg total) under the tongue daily.    Dispense:  100 tablet    Refill:  3      Follow-up: Return in about 3 months (around 12/20/2022) for a follow-up visit.  Sonda Primes, MD

## 2022-09-19 NOTE — Assessment & Plan Note (Addendum)
Cont on Metformin, Jardiance,  Lipitor, Ozempic Check A1c

## 2022-09-19 NOTE — Assessment & Plan Note (Signed)
On empiric iron po  Hematology ref GI ref

## 2022-09-19 NOTE — Assessment & Plan Note (Signed)
Waiting for surgery - postponed due to anemia

## 2022-09-19 NOTE — Assessment & Plan Note (Signed)
On vitamin B12 injections subcutaneously Check labs Start SL  B12

## 2022-09-19 NOTE — Addendum Note (Signed)
Addended by: Tresa Garter on: 09/19/2022 09:44 AM   Modules accepted: Orders

## 2022-09-20 LAB — IRON,TIBC AND FERRITIN PANEL
%SAT: 22 % (calc) (ref 16–45)
Ferritin: 20 ng/mL (ref 16–288)
Iron: 83 ug/dL (ref 45–160)
TIBC: 369 mcg/dL (calc) (ref 250–450)

## 2022-09-24 ENCOUNTER — Other Ambulatory Visit (HOSPITAL_COMMUNITY): Payer: Self-pay

## 2022-10-01 ENCOUNTER — Encounter: Payer: Self-pay | Admitting: Physician Assistant

## 2022-10-01 ENCOUNTER — Other Ambulatory Visit (HOSPITAL_COMMUNITY): Payer: Self-pay

## 2022-10-01 ENCOUNTER — Ambulatory Visit: Payer: Commercial Managed Care - PPO | Admitting: Physician Assistant

## 2022-10-01 VITALS — BP 128/70 | HR 68 | Ht 64.5 in | Wt 186.0 lb

## 2022-10-01 DIAGNOSIS — E113592 Type 2 diabetes mellitus with proliferative diabetic retinopathy without macular edema, left eye: Secondary | ICD-10-CM

## 2022-10-01 DIAGNOSIS — D509 Iron deficiency anemia, unspecified: Secondary | ICD-10-CM | POA: Diagnosis not present

## 2022-10-01 DIAGNOSIS — E538 Deficiency of other specified B group vitamins: Secondary | ICD-10-CM

## 2022-10-01 DIAGNOSIS — K635 Polyp of colon: Secondary | ICD-10-CM

## 2022-10-01 MED ORDER — CLENPIQ 10-3.5-12 MG-GM -GM/175ML PO SOLN
1.0000 | ORAL | 0 refills | Status: DC
  Filled 2022-10-01: qty 350, 1d supply, fill #0

## 2022-10-01 NOTE — Progress Notes (Signed)
10/01/2022 Katelyn Peterson [id number] 07-28-1945  Referring provider: Tresa Garter, MD Primary GI doctor: Dr. Chales Abrahams (Dr. Christella Hartigan)  ASSESSMENT AND PLAN:   Iron deficiency anemia and B12 def with history of colon polyps Patient has had years of anemia, in feb had HGB of 9, ferritin below 30 and low saturation as well as B12 def. Her HGB is now 11 on B12 shots and iron once daily No overt GI bleeding. She is suppose to have knee surgery but needs evaluation for anemia prior, will schedule for EGD/Colon with first available Dr. Chales Abrahams If this is negative, can consider capsule endoscopy Risk of bowel prep, conscious sedation, and EGD and colonoscopy were discussed.  Risks include but are not limited to dehydration, pain, bleeding, cardiopulmonary process, bowel perforation, or other possible adverse outcomes..  Treatment plan was discussed with patient, and agreed upon.  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   Discuss with primary care see about potentially getting off this medication. Gastroparesis diet given to the patient.  Patient should be instructed to hold this medications if dose falls within 2 days of endoscopic procedure, due to increased risk of retained gastric contents.    Patient Care Team: Plotnikov, Georgina Quint, MD as PCP - General  HISTORY OF PRESENT ILLNESS: 77 y.o. female with a past medical history of hypertension, hyperlipidemia, type 2 diabetes, CKD stage III, anemia, B12 def and IDA and others listed below presents for evaluation of IDA.   08/22/2016 colonoscopy with Dr. Christella Hartigan for screening purposes showed excellent prep, 2 polyps 2 to 3 mm sigmoid colon, diverticulosis, hemorrhoids, tubular adenomatous, recall 5 years.   08/2021  09/19/2022 Hgb 11.4, MCV normal 93.1 she has had anemia for at least a year.  Potassium 5.2.  Creatinine 1.32, BUN 21, normal LFTs.  She has had an anemia for at least 12 years.  She was going for a presurgical clearance but her HGB was 9.7 12/18/2021 and then 9.1 04/18/22.  She has been unable to get her knee surgery.  06/19/2022 B12 low at 205, iron 71, saturation 19, ferritin 16. 09/19/2022 B12 greater than 1500, iron 83, saturation 22, ferritin 20 She has been on B12 shots and has been on oral iron once a day since Feb. She denies fatigue, dizziness SOB, CP.  Denies melena or hematochezia.  She has intermittent GERD, worse with pizza/sauce.  Denies dysphagia nausea, vomiting, normal appetite.  She has lost 100lbs with trying to cut back on portions, walking, and she is on ozempic x 3-4 months.  Patient has not been able to have her knee surgery due to anemia.  She denies blood thinner use.  She reports NSAID use, use to take goody powders but has not take it in a year. She denies ETOH use.   She denies tobacco use.  She denies drug use.    She  reports that she has never smoked. She has never used smokeless tobacco. She reports that she does not drink alcohol and does not use drugs.  RELEVANT LABS AND IMAGING: CBC    Component Value Date/Time   WBC 5.1 09/19/2022 0958   RBC 3.73 (L) 09/19/2022 0958   HGB 11.4 (L) 09/19/2022 0958   HCT 34.7 (L) 09/19/2022 0958   PLT 261.0 09/19/2022 5621  MCV 93.1 09/19/2022 0958   MCH 26.8 04/18/2022 0930   MCHC 32.9 09/19/2022 0958   RDW 13.7 09/19/2022 0958   LYMPHSABS 1.6 09/19/2022 0958   MONOABS 0.4 09/19/2022 0958   EOSABS 0.1 09/19/2022 0958   BASOSABS 0.0 09/19/2022 0958   Recent Labs    12/18/21 1021 04/18/22 0930 06/19/22 1010 09/19/22 0958  HGB 9.7* 9.1* 11.5* 11.4*    CMP     Component Value Date/Time   NA 141 09/19/2022 0958   K 5.2 No hemolysis seen (H) 09/19/2022 0958   CL 107 09/19/2022 0958   CO2 26  09/19/2022 0958   GLUCOSE 91 09/19/2022 0958   BUN 21 09/19/2022 0958   CREATININE 1.32 (H) 09/19/2022 0958   CALCIUM 9.8 09/19/2022 0958   PROT 7.2 09/19/2022 0958   ALBUMIN 3.9 09/19/2022 0958   AST 12 09/19/2022 0958   ALT 15 09/19/2022 0958   ALKPHOS 91 09/19/2022 0958   BILITOT 0.5 09/19/2022 0958   GFRNONAA 37 (L) 04/18/2022 0930   GFRAA 131 02/26/2008 0857      Latest Ref Rng & Units 09/19/2022    9:58 AM 06/19/2022   10:10 AM 12/18/2021   10:21 AM  Hepatic Function  Total Protein 6.0 - 8.3 g/dL 7.2  7.1  7.3   Albumin 3.5 - 5.2 g/dL 3.9  4.0  3.9   AST 0 - 37 U/L 12  12  12    ALT 0 - 35 U/L 15  11  10    Alk Phosphatase 39 - 117 U/L 91  77  70   Total Bilirubin 0.2 - 1.2 mg/dL 0.5  0.3  0.3       Current Medications:   Current Outpatient Medications (Endocrine & Metabolic):    empagliflozin (JARDIANCE) 10 MG TABS tablet, Take 1 tablet (10 mg total) by mouth daily before breakfast.   metFORMIN (GLUCOPHAGE) 1000 MG tablet, Take 1 tablet (1,000 mg total) by mouth daily.   metFORMIN (GLUCOPHAGE) 1000 MG tablet, Take 1 tablet (1,000 mg total) by mouth 2 (two) times daily with a meal.   Semaglutide,0.25 or 0.5MG /DOS, (OZEMPIC, 0.25 OR 0.5 MG/DOSE,) 2 MG/3ML SOPN, Inject 0.5 mg into the skin once a week.  Current Outpatient Medications (Cardiovascular):    atorvastatin (LIPITOR) 20 MG tablet, TAKE 1 TABLET BY MOUTH ONCE DAILY (Patient taking differently: Take 20 mg by mouth 3 (three) times a week.)   lisinopril (ZESTRIL) 40 MG tablet, Take 1 tablet (40 mg total) by mouth daily.   spironolactone (ALDACTONE) 50 MG tablet, Take 1 tablet (50 mg total) by mouth daily.   verapamil (VERELAN) 240 MG 24 hr capsule, Take 1 capsule (240 mg total) by mouth at bedtime.   Current Outpatient Medications (Analgesics):    aspirin EC 81 MG tablet, Take 81 mg by mouth daily. Swallow whole.   traMADol (ULTRAM) 50 MG tablet, Take 1 tablet (50 mg total) by mouth 4 (four) times daily. (Patient  taking differently: Take 50 mg by mouth every 6 (six) hours as needed for moderate pain.)  Current Outpatient Medications (Hematological):    Cyanocobalamin (VITAMIN B-12) 1000 MCG SUBL, Place 1 tablet (1,000 mcg total) under the tongue daily.   Iron, Ferrous Sulfate, 325 (65 Fe) MG TABS, Take 1 tablet by mouth daily.  Current Outpatient Medications (Other):    Cholecalciferol 1000 UNITS tablet, Take 1,000 Units by mouth daily.  Medical History:  Past Medical History:  Diagnosis Date   Arthritis    HTN (hypertension)  Hyperlipidemia    Obesity    Type II or unspecified type diabetes mellitus without mention of complication, not stated as uncontrolled    Allergies:  Allergies  Allergen Reactions   Atorvastatin     REACTION: cramps   Hydrochlorothiazide     REACTION: Cramps   Pravastatin     cramps     Surgical History:  She  has a past surgical history that includes Abdominal hysterectomy (1985). Family History:  Her family history includes Diabetes in her mother; Hypertension in her father, mother, and another family member.  REVIEW OF SYSTEMS  : All other systems reviewed and negative except where noted in the History of Present Illness.  PHYSICAL EXAM: BP 128/70   Pulse 68   Ht 5' 4.5" (1.638 m)   Wt 186 lb (84.4 kg)   BMI 31.43 kg/m  General Appearance: Well nourished, in no apparent distress. Head:   Normocephalic and atraumatic. Eyes:  sclerae anicteric,conjunctive pink  Respiratory: Respiratory effort normal, BS equal bilaterally without rales, rhonchi, wheezing. Cardio: RRR with no MRGs. Peripheral pulses intact.  Abdomen: Soft,  Non-distended ,active bowel sounds. No tenderness . Without guarding and Without rebound. No masses. Rectal: Not evaluated Musculoskeletal: Full ROM, antalgic gait. Without edema. Skin:  Dry and intact without significant lesions or rashes Neuro: Alert and  oriented x4;  No focal deficits. Psych:  Cooperative. Normal mood and  affect.    Doree Albee, PA-C 10:00 AM

## 2022-10-01 NOTE — Patient Instructions (Addendum)
You have been scheduled for a colonoscopy. Please follow written instructions given to you at your visit today.   Please pick up your prep supplies at the pharmacy within the next 1-3 days.  If you use inhalers (even only as needed), please bring them with you on the day of your procedure.  DO NOT TAKE 7 DAYS PRIOR TO TEST- Trulicity (dulaglutide) Ozempic, Wegovy (semaglutide) Mounjaro (tirzepatide) Bydureon Bcise (exanatide extended release)  DO NOT TAKE 1 DAY PRIOR TO YOUR TEST Rybelsus (semaglutide) Adlyxin (lixisenatide) Victoza (liraglutide) Byetta (exanatide) ___________________________________________________________________________  _______________________________________________________  If your blood pressure at your visit was 140/90 or greater, please contact your primary care physician to follow up on this.  _______________________________________________________  If you are age 80 or older, your body mass index should be between 23-30. Your Body mass index is 31.43 kg/m. If this is out of the aforementioned range listed, please consider follow up with your Primary Care Provider.  If you are age 7 or younger, your body mass index should be between 19-25. Your Body mass index is 31.43 kg/m. If this is out of the aformentioned range listed, please consider follow up with your Primary Care Provider.   ________________________________________________________  The [facility] providers would like to encourage you to use [facility] to communicate with providers for non-urgent requests or questions.  Due to long hold times on the telephone, sending your provider a message by [facility] may be a faster and more efficient way to get a response.  Please allow 48 business hours for a response.  Please remember that this is for non-urgent requests.  _______________________________________________________ It was a pleasure to see you today!  Thank you for trusting me with your  gastrointestinal care!

## 2022-10-03 NOTE — Progress Notes (Signed)
Agree with assessment/plan.  Raj Gupta, MD [facility] [phone]  

## 2022-10-05 ENCOUNTER — Other Ambulatory Visit: Payer: Self-pay

## 2022-10-05 ENCOUNTER — Encounter: Payer: Self-pay | Admitting: Gastroenterology

## 2022-10-05 ENCOUNTER — Other Ambulatory Visit (HOSPITAL_COMMUNITY): Payer: Self-pay

## 2022-10-05 ENCOUNTER — Ambulatory Visit (AMBULATORY_SURGERY_CENTER): Payer: Commercial Managed Care - PPO | Admitting: Gastroenterology

## 2022-10-05 ENCOUNTER — Other Ambulatory Visit: Payer: Commercial Managed Care - PPO

## 2022-10-05 ENCOUNTER — Telehealth: Payer: Self-pay

## 2022-10-05 VITALS — BP 132/75 | HR 72 | Temp 98.6°F | Resp 15 | Ht 64.0 in | Wt 186.0 lb

## 2022-10-05 DIAGNOSIS — K259 Gastric ulcer, unspecified as acute or chronic, without hemorrhage or perforation: Secondary | ICD-10-CM | POA: Diagnosis not present

## 2022-10-05 DIAGNOSIS — D509 Iron deficiency anemia, unspecified: Secondary | ICD-10-CM

## 2022-10-05 DIAGNOSIS — K635 Polyp of colon: Secondary | ICD-10-CM

## 2022-10-05 DIAGNOSIS — K449 Diaphragmatic hernia without obstruction or gangrene: Secondary | ICD-10-CM | POA: Diagnosis not present

## 2022-10-05 DIAGNOSIS — K2950 Unspecified chronic gastritis without bleeding: Secondary | ICD-10-CM

## 2022-10-05 DIAGNOSIS — Z86010 Personal history of colonic polyps: Secondary | ICD-10-CM | POA: Diagnosis not present

## 2022-10-05 DIAGNOSIS — B9681 Helicobacter pylori [H. pylori] as the cause of diseases classified elsewhere: Secondary | ICD-10-CM

## 2022-10-05 DIAGNOSIS — D124 Benign neoplasm of descending colon: Secondary | ICD-10-CM

## 2022-10-05 DIAGNOSIS — R634 Abnormal weight loss: Secondary | ICD-10-CM

## 2022-10-05 LAB — CBC WITH DIFFERENTIAL/PLATELET
Basophils Absolute: 0 10*3/uL (ref 0.0–0.1)
Basophils Relative: 0.8 % (ref 0.0–3.0)
Eosinophils Absolute: 0.1 10*3/uL (ref 0.0–0.7)
Eosinophils Relative: 1.5 % (ref 0.0–5.0)
HCT: 35.7 % — ABNORMAL LOW (ref 36.0–46.0)
Hemoglobin: 11.5 g/dL — ABNORMAL LOW (ref 12.0–15.0)
Lymphocytes Relative: 32.1 % (ref 12.0–46.0)
Lymphs Abs: 1.5 10*3/uL (ref 0.7–4.0)
MCHC: 32.2 g/dL (ref 30.0–36.0)
MCV: 94.2 fl (ref 78.0–100.0)
Monocytes Absolute: 0.3 10*3/uL (ref 0.1–1.0)
Monocytes Relative: 7 % (ref 3.0–12.0)
Neutro Abs: 2.7 10*3/uL (ref 1.4–7.7)
Neutrophils Relative %: 58.6 % (ref 43.0–77.0)
Platelets: 232 10*3/uL (ref 150.0–400.0)
RBC: 3.79 Mil/uL — ABNORMAL LOW (ref 3.87–5.11)
RDW: 14.3 % (ref 11.5–15.5)
WBC: 4.7 10*3/uL (ref 4.0–10.5)

## 2022-10-05 LAB — COMPREHENSIVE METABOLIC PANEL
ALT: 12 U/L (ref 0–35)
AST: 11 U/L (ref 0–37)
Albumin: 3.8 g/dL (ref 3.5–5.2)
Alkaline Phosphatase: 72 U/L (ref 39–117)
BUN: 13 mg/dL (ref 6–23)
CO2: 29 mEq/L (ref 19–32)
Calcium: 9 mg/dL (ref 8.4–10.5)
Chloride: 106 mEq/L (ref 96–112)
Creatinine, Ser: 1.01 mg/dL (ref 0.40–1.20)
GFR: 54.05 mL/min — ABNORMAL LOW (ref >60.00)
Glucose, Bld: 86 mg/dL (ref 70–99)
Potassium: 3.7 mEq/L (ref 3.5–5.1)
Sodium: 140 mEq/L (ref 135–145)
Total Bilirubin: 0.5 mg/dL (ref 0.2–1.2)
Total Protein: 6.7 g/dL (ref 6.0–8.3)

## 2022-10-05 MED ORDER — SODIUM CHLORIDE 0.9 % IV SOLN: 500.0000 mL | INTRAVENOUS | Status: DC

## 2022-10-05 MED ORDER — OMEPRAZOLE 20 MG PO CPDR
20.0000 mg | DELAYED_RELEASE_CAPSULE | Freq: Every day | ORAL | 4 refills | Status: DC
Start: 2022-10-05 — End: 2022-10-19
  Filled 2022-10-05: qty 90, 90d supply, fill #0

## 2022-10-05 NOTE — Progress Notes (Signed)
Called to room to assist during endoscopic procedure.  Patient ID and intended procedure confirmed with present staff. Received instructions for my participation in the procedure from the performing physician.  

## 2022-10-05 NOTE — Progress Notes (Signed)
Uneventful anesthetic. Report to pacu rn. Vss. Care resumed by rn. 

## 2022-10-05 NOTE — Progress Notes (Signed)
Pt's states no medical or surgical changes since previsit or office visit. 

## 2022-10-05 NOTE — Progress Notes (Signed)
Message sent to Emeline Darling by Cresenciano Genre concerning need for CT to be ordered

## 2022-10-05 NOTE — Op Note (Signed)
[facility] Patient Name: Katelyn Peterson Procedure Date: 10/05/2022 2:44 PM MRN: [id number] Endoscopist: Lynann Bologna , MD, [id number] Age: 77 Referring MD:  Date of Birth: 11-13-1945 Gender: Female Account #: [id number] Procedure:                Colonoscopy Indications:              Iron deficiency anemia Medicines:                Monitored Anesthesia Care Procedure:                Pre-Anesthesia Assessment:                           - Prior to the procedure, a History and Physical                            was performed, and patient medications and                            allergies were reviewed. The patient's tolerance of                            previous anesthesia was also reviewed. The risks                            and benefits of the procedure and the sedation                            options and risks were discussed with the patient.                            All questions were answered, and informed consent                            was obtained. Prior Anticoagulants: The patient has                            taken no anticoagulant or antiplatelet agents. ASA                            Grade Assessment: II - A patient with mild systemic                            disease. After reviewing the risks and benefits,                            the patient was deemed in satisfactory condition to                            undergo the procedure.                           After obtaining informed consent, the colonoscope  was passed under direct vision. Throughout the                            procedure, the patient's blood pressure, pulse, and                            oxygen saturations were monitored continuously. The                            Olympus PCF-[id number] (#[id number]) Colonoscope was                            introduced through the anus and advanced to the 2                            cm into the ileum. The colonoscopy was  performed                            without difficulty. The patient tolerated the                            procedure well. The quality of the bowel                            preparation was good. The terminal ileum, ileocecal                            valve, appendiceal orifice, and rectum were                            photographed. Scope In: 3:17:44 PM Scope Out: 3:35:23 PM Scope Withdrawal Time: 0 hours 13 minutes 7 seconds  Total Procedure Duration: 0 hours 17 minutes 39 seconds  Findings:                 A 6 mm polyp was found in the mid descending colon.                            The polyp was sessile. The polyp was removed with a                            cold snare. Resection and retrieval were complete.                           Multiple medium-mouthed diverticula were found in                            the entire colon.                           Non-bleeding internal hemorrhoids were found during                            retroflexion. The hemorrhoids were small and Grade  I (internal hemorrhoids that do not prolapse).                           The terminal ileum appeared normal.                           The exam was otherwise without abnormality on                            direct and retroflexion views. Complications:            No immediate complications. Estimated Blood Loss:     Estimated blood loss: none. Impression:               - One 6 mm polyp in the mid descending colon,                            removed with a cold snare. Resected and retrieved.                           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                           - Non-bleeding internal hemorrhoids.                           - The examined portion of the ileum was normal.                           - The examination was otherwise normal on direct                            and retroflexion  views. Recommendation:           - Patient has a contact number available for                            emergencies. The signs and symptoms of potential                            delayed complications were discussed with the                            patient. Return to normal activities tomorrow.                            Written discharge instructions were provided to the                            patient.                           - High fiber diet.                           -  Continue present medications.                           - Await pathology results.                           - Check CBC, CMP today                           - Since patient has history of significant weight                            loss, would recommend CT Abdo/pelvis preferably                            with contrast. Viviann Spare to schedule.                           - Repeat colonoscopy is not recommended for                            surveillance.                           - The findings and recommendations were discussed                            with the patient's family. Lynann Bologna, MD 10/05/2022 3:42:57 PM This report has been signed electronically.

## 2022-10-05 NOTE — Telephone Encounter (Signed)
Left detailed message for patient's husband since patient's procedure was this afternoon & likely has not arrived home yet. CT scheduled for 10/09/22 at 9:30 am at [facility]. Pt needs to arrive by 9:15 am, and NPO 4 hours prior. These instructions were left in the vm & provided radiology number for husband to call if they need to reschedule. Also, provided our office number for patient to call back with any questions.

## 2022-10-05 NOTE — Op Note (Signed)
[facility] Patient Name: Katelyn Peterson Procedure Date: 10/05/2022 2:53 PM MRN: [id number] Endoscopist: Lynann Bologna , MD, [id number] Age: 77 Referring MD:  Date of Birth: [birth year] Gender: Female Account #: [id number] Procedure:                Upper GI endoscopy Indications:              GERD, IDA Medicines:                Monitored Anesthesia Care Procedure:                Pre-Anesthesia Assessment:                           - Prior to the procedure, a History and Physical                            was performed, and patient medications and                            allergies were reviewed. The patient's tolerance of                            previous anesthesia was also reviewed. The risks                            and benefits of the procedure and the sedation                            options and risks were discussed with the patient.                            All questions were answered, and informed consent                            was obtained. Prior Anticoagulants: The patient has                            taken no anticoagulant or antiplatelet agents. ASA                            Grade Assessment: II - A patient with mild systemic                            disease. After reviewing the risks and benefits,                            the patient was deemed in satisfactory condition to                            undergo the procedure.                           After obtaining informed consent, the endoscope was  passed under direct vision. Throughout the                            procedure, the patient's blood pressure, pulse, and                            oxygen saturations were monitored continuously. The                            Olympus scope [id number] was introduced through the                            mouth, and advanced to the second part of duodenum.                            The upper GI endoscopy was accomplished  without                            difficulty. The patient tolerated the procedure                            well. Scope In: Scope Out: Findings:                 The lower third of the esophagus was mildly                            tortuous but normal.                           The Z-line was irregular and was found 35 cm from                            the incisors. Biopsies were taken with a cold                            forceps for histology, directed by [facility].                           A small hiatal hernia was present.                           Localized moderate inflammation characterized by                            erosions, erythema, friability and granularity was                            found in the gastric antrum. Biopsies were taken                            with a cold forceps for histology.                           One non-bleeding superficial gastric ulcer with no  stigmata of bleeding was found in the gastric                            fundus with a small diverticulum. The lesion was 6                            mm in largest dimension. Biopsies were taken with a                            cold forceps for histology.                           The examined duodenum was normal. Biopsies for                            histology were taken with a cold forceps for                            evaluation of celiac disease. Complications:            No immediate complications. Estimated Blood Loss:     Estimated blood loss: none. Impression:               - Mild presbyesophagus.                           - Z-line irregular, 35 cm from the incisors.                            Biopsied.                           - Small hiatal hernia.                           - Gastritis. Biopsied.                           - Non-bleeding gastric ulcer with no stigmata of                            bleeding. Biopsied.                           - Normal examined  duodenum. Biopsied. Recommendation:           - Patient has a contact number available for                            emergencies. The signs and symptoms of potential                            delayed complications were discussed with the                            patient. Return to normal activities tomorrow.  Written discharge instructions were provided to the                            patient.                           - Resume previous diet.                           - Start omeprazole 20 mg p.o. daily #90, 4RF                           - Continue present medications.                           - Avoid ibuprofen, naproxen, or other non-steroidal                            anti-inflammatory drugs.                           - Proceed with colonoscopy.                           - The findings and recommendations were discussed                            with the patient's family. Lynann Bologna, MD 10/05/2022 3:39:37 PM This report has been signed electronically.

## 2022-10-05 NOTE — Patient Instructions (Addendum)
Await results of gastric ulcer biopsies and other gastric biopsies   Start Omeprazole 20 mg one tablet daily ( #90 # refills )  Avoid Ibuprofen,Naproxen,Aleve,Advil or other non steroidal anti inflammatory medications   Continue present medications & diet  Handouts on polyps,diverticulosis,& hemorrhoids given to you today.   Await pathology results on polyp removed   High fiber diet ( handout given to you today  Check Blood work today- CBC,CMP,- will take you to lab on discharge today  CT abd /pelvis to be ordered by Viviann Spare ( Dr Urban Gibson office)    YOU HAD AN ENDOSCOPIC PROCEDURE TODAY AT THE [facility]:   Refer to the procedure report that was given to you for any specific questions about what was found during the examination.  If the procedure report does not answer your questions, please call your gastroenterologist to clarify.  If you requested that your care partner not be given the details of your procedure findings, then the procedure report has been included in a sealed envelope for you to review at your convenience later.  YOU SHOULD EXPECT: Some feelings of bloating in the abdomen. Passage of more gas than usual.  Walking can help get rid of the air that was put into your GI tract during the procedure and reduce the bloating. If you had a lower endoscopy (such as a colonoscopy or flexible sigmoidoscopy) you may notice spotting of blood in your stool or on the toilet paper. If you underwent a bowel prep for your procedure, you may not have a normal bowel movement for a few days.  Please Note:  You might notice some irritation and congestion in your nose or some drainage.  This is from the oxygen used during your procedure.  There is no need for concern and it should clear up in a day or so.  SYMPTOMS TO REPORT IMMEDIATELY:  Following lower endoscopy (colonoscopy or flexible sigmoidoscopy):  Excessive amounts of blood in the stool  Significant tenderness or  worsening of abdominal pains  Swelling of the abdomen that is new, acute  Fever of 100F or higher  Following upper endoscopy (EGD)  Vomiting of blood or coffee ground material  New chest pain or pain under the shoulder blades  Painful or persistently difficult swallowing  New shortness of breath  Fever of 100F or higher  Black, tarry-looking stools  For urgent or emergent issues, a gastroenterologist can be reached at any hour by calling [phone]. Do not use MyChart messaging for urgent concerns.    DIET:  We do recommend a small meal at first, but then you may proceed to your regular diet.  Drink plenty of fluids but you should avoid alcoholic beverages for 24 hours.  ACTIVITY:  You should plan to take it easy for the rest of today and you should NOT DRIVE or use heavy machinery until tomorrow (because of the sedation medicines used during the test).    FOLLOW UP: Our staff will call the number listed on your records the next business day following your procedure.  We will call around 7:15- 8:00 am to check on you and address any questions or concerns that you may have regarding the information given to you following your procedure. If we do not reach you, we will leave a message.     If any biopsies were taken you will be contacted by phone or by letter within the next 1-3 weeks.  Please call us at [phone] if  you have not heard about the biopsies in 3 weeks.    SIGNATURES/CONFIDENTIALITY: You and/or your care partner have signed paperwork which will be entered into your electronic medical record.  These signatures attest to the fact that that the information above on your After Visit Summary has been reviewed and is understood.  Full responsibility of the confidentiality of this discharge information lies with you and/or your care-partner.

## 2022-10-05 NOTE — Progress Notes (Signed)
10/01/2022 Katelyn Peterson [id number] 1945-09-19   Referring provider: Tresa Garter, MD Primary GI doctor: Dr. Chales Abrahams (Dr. Christella Hartigan)   ASSESSMENT AND PLAN:    Iron deficiency anemia and B12 def with history of colon polyps Patient has had years of anemia, in feb had HGB of 9, ferritin below 30 and low saturation as well as B12 def. Her HGB is now 11 on B12 shots and iron once daily No overt GI bleeding. She is suppose to have knee surgery but needs evaluation for anemia prior, will schedule for EGD/Colon with first available Dr. Chales Abrahams If this is negative, can consider capsule endoscopy Risk of bowel prep, conscious sedation, and EGD and colonoscopy were discussed.  Risks include but are not limited to dehydration, pain, bleeding, cardiopulmonary process, bowel perforation, or other possible adverse outcomes..  Treatment plan was discussed with patient, and agreed upon.   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   Discuss with primary care see about potentially getting off this medication. Gastroparesis diet given to the patient.  Patient should be instructed to hold this medications if dose falls within 2 days of endoscopic procedure, due to increased risk of retained gastric contents.       Patient Care Team: Plotnikov, Georgina Quint, MD as PCP - General   HISTORY OF PRESENT ILLNESS: 77 y.o. female with a past medical history of hypertension, hyperlipidemia, type 2 diabetes, CKD stage III, anemia, B12 def and IDA and others listed below presents for evaluation of IDA.    08/22/2016 colonoscopy with Dr. Christella Hartigan for screening purposes showed excellent prep, 2 polyps 2 to 3 mm sigmoid colon, diverticulosis, hemorrhoids, tubular adenomatous, recall 5 years.   08/2021  09/19/2022 Hgb 11.4, MCV normal 93.1 she has had anemia for at least a year.  Potassium 5.2.  Creatinine 1.32, BUN 21, normal LFTs.   She has had an anemia for at least 12 years.  She was going for a presurgical clearance but her HGB was 9.7 12/18/2021 and then 9.1 04/18/22.  She has been unable to get her knee surgery.  06/19/2022 B12 low at 205, iron 71, saturation 19, ferritin 16. 09/19/2022 B12 greater than 1500, iron 83, saturation 22, ferritin 20 She has been on B12 shots and has been on oral iron once a day since Feb. She denies fatigue, dizziness SOB, CP.  Denies melena or hematochezia.  She has intermittent GERD, worse with pizza/sauce.  Denies dysphagia nausea, vomiting, normal appetite.  She has lost 100lbs with trying to cut back on portions, walking, and she is on ozempic x 3-4 months.  Patient has not been able to have her knee surgery due to anemia.   She denies blood thinner use.  She reports NSAID use, use to take goody powders but has not take it in a year. She denies ETOH use.   She denies tobacco use.  She denies drug use.     She  reports that she has never smoked. She has never used smokeless tobacco. She reports that she does not drink alcohol and does not use drugs.   RELEVANT LABS AND IMAGING: CBC Labs (Brief)          Component Value Date/Time    WBC 5.1 09/19/2022 0958    RBC 3.73 (L) 09/19/2022 3295  HGB 11.4 (L) 09/19/2022 0958    HCT 34.7 (L) 09/19/2022 0958    PLT 261.0 09/19/2022 0958    MCV 93.1 09/19/2022 0958    MCH 26.8 04/18/2022 0930    MCHC 32.9 09/19/2022 0958    RDW 13.7 09/19/2022 0958    LYMPHSABS 1.6 09/19/2022 0958    MONOABS 0.4 09/19/2022 0958    EOSABS 0.1 09/19/2022 0958    BASOSABS 0.0 09/19/2022 0958      Recent Labs (within last 365 days)        Recent Labs    12/18/21 1021 04/18/22 0930 06/19/22 1010 09/19/22 0958  HGB 9.7* 9.1* 11.5* 11.4*        CMP     Labs (Brief)          Component Value  Date/Time    NA 141 09/19/2022 0958    K 5.2 No hemolysis seen (H) 09/19/2022 0958    CL 107 09/19/2022 0958    CO2 26 09/19/2022 0958    GLUCOSE 91 09/19/2022 0958    BUN 21 09/19/2022 0958    CREATININE 1.32 (H) 09/19/2022 0958    CALCIUM 9.8 09/19/2022 0958    PROT 7.2 09/19/2022 0958    ALBUMIN 3.9 09/19/2022 0958    AST 12 09/19/2022 0958    ALT 15 09/19/2022 0958    ALKPHOS 91 09/19/2022 0958    BILITOT 0.5 09/19/2022 0958    GFRNONAA 37 (L) 04/18/2022 0930    GFRAA 131 02/26/2008 0857          Latest Ref Rng & Units 09/19/2022    9:58 AM 06/19/2022   10:10 AM 12/18/2021   10:21 AM  Hepatic Function  Total Protein 6.0 - 8.3 g/dL 7.2  7.1  7.3   Albumin 3.5 - 5.2 g/dL 3.9  4.0  3.9   AST 0 - 37 U/L 12  12  12    ALT 0 - 35 U/L 15  11  10    Alk Phosphatase 39 - 117 U/L 91  77  70   Total Bilirubin 0.2 - 1.2 mg/dL 0.5  0.3  0.3       Current Medications:    Current Outpatient Medications (Endocrine & Metabolic):    empagliflozin (JARDIANCE) 10 MG TABS tablet, Take 1 tablet (10 mg total) by mouth daily before breakfast.   metFORMIN (GLUCOPHAGE) 1000 MG tablet, Take 1 tablet (1,000 mg total) by mouth daily.   metFORMIN (GLUCOPHAGE) 1000 MG tablet, Take 1 tablet (1,000 mg total) by mouth 2 (two) times daily with a meal.   Semaglutide,0.25 or 0.5MG /DOS, (OZEMPIC, 0.25 OR 0.5 MG/DOSE,) 2 MG/3ML SOPN, Inject 0.5 mg into the skin once a week.   Current Outpatient Medications (Cardiovascular):    atorvastatin (LIPITOR) 20 MG tablet, TAKE 1 TABLET BY MOUTH ONCE DAILY (Patient taking differently: Take 20 mg by mouth 3 (three) times a week.)   lisinopril (ZESTRIL) 40 MG tablet, Take 1 tablet (40 mg total) by mouth daily.   spironolactone (ALDACTONE) 50 MG tablet, Take 1 tablet (50 mg total) by mouth daily.   verapamil (VERELAN) 240 MG 24 hr capsule, Take 1 capsule (240 mg total) by mouth at bedtime.     Current Outpatient Medications (Analgesics):    aspirin EC 81 MG tablet, Take  81 mg by mouth daily. Swallow whole.   traMADol (ULTRAM) 50 MG tablet, Take 1 tablet (50 mg total) by mouth 4 (four) times daily. (Patient taking differently: Take 50 mg by mouth every  6 (six) hours as needed for moderate pain.)   Current Outpatient Medications (Hematological):    Cyanocobalamin (VITAMIN B-12) 1000 MCG SUBL, Place 1 tablet (1,000 mcg total) under the tongue daily.   Iron, Ferrous Sulfate, 325 (65 Fe) MG TABS, Take 1 tablet by mouth daily.   Current Outpatient Medications (Other):    Cholecalciferol 1000 UNITS tablet, Take 1,000 Units by mouth daily.   Medical History:      Past Medical History:  Diagnosis Date   Arthritis     HTN (hypertension)     Hyperlipidemia     Obesity     Type II or unspecified type diabetes mellitus without mention of complication, not stated as uncontrolled          Allergies:  Allergies       Allergies  Allergen Reactions   Atorvastatin        REACTION: cramps   Hydrochlorothiazide        REACTION: Cramps   Pravastatin        cramps        Surgical History:  She  has a past surgical history that includes Abdominal hysterectomy (1985). Family History:  Her family history includes Diabetes in her mother; Hypertension in her father, mother, and another family member.   REVIEW OF SYSTEMS  : All other systems reviewed and negative except where noted in the History of Present Illness.   PHYSICAL EXAM: BP 128/70   Pulse 68   Ht 5' 4.5" (1.638 m)   Wt 186 lb (84.4 kg)   BMI 31.43 kg/m  General Appearance: Well nourished, in no apparent distress. Head:   Normocephalic and atraumatic. Eyes:  sclerae anicteric,conjunctive pink  Respiratory: Respiratory effort normal, BS equal bilaterally without rales, rhonchi, wheezing. Cardio: RRR with no MRGs. Peripheral pulses intact.  Abdomen: Soft,  Non-distended ,active bowel sounds. No tenderness . Without guarding and Without rebound. No masses. Rectal: Not evaluated Musculoskeletal:  Full ROM, antalgic gait. Without edema. Skin:  Dry and intact without significant lesions or rashes Neuro: Alert and  oriented x4;  No focal deficits. Psych:  Cooperative. Normal mood and affect.      Doree Albee, PA-C 10:00 AM     Attending physician's note   I have taken history, reviewed the chart and examined the patient. I performed a substantive portion of this encounter, including complete performance of at least one of the key components, in conjunction with the APP. I agree with the Advanced Practitioner's note, impression and recommendations.    Edman Circle, MD [facility] [phone]

## 2022-10-05 NOTE — Telephone Encounter (Signed)
-----   Message from Nurse Roma Kayser sent at 10/05/2022  3:55 PM EDT ----- Can you please schedule a CT of the abdomen for this patient due to significant weight loss per Dr. Donne Hazel request. Thank you

## 2022-10-08 ENCOUNTER — Telehealth: Payer: Self-pay | Admitting: *Deleted

## 2022-10-08 NOTE — Telephone Encounter (Signed)
  Follow up Call-     10/05/2022    2:22 PM  Call back number  Post procedure Call Back phone  # [phone]  Permission to leave phone message Yes     Patient questions:  Do you have a fever, pain , or abdominal swelling? No. Pain Score  0 *  Have you tolerated food without any problems? Yes.    Have you been able to return to your normal activities? Yes.    Do you have any questions about your discharge instructions: Diet   No. Medications  No. Follow up visit  No.  Do you have questions or concerns about your Care? No.  Actions: * If pain score is 4 or above: No action needed, pain <4.

## 2022-10-08 NOTE — Telephone Encounter (Signed)
Left message for pt to call back  °

## 2022-10-08 NOTE — Telephone Encounter (Signed)
Spoke with pt in regard to CT scan: CT scheduled for 10/09/22 at 9:30 am at [facility]. Pt needs to arrive by 9:15 am, and NPO 4 hours prior. Pt stated that she was aware. Pt verbalized understanding with all questions answered.

## 2022-10-09 ENCOUNTER — Ambulatory Visit (HOSPITAL_BASED_OUTPATIENT_CLINIC_OR_DEPARTMENT_OTHER)
Admission: RE | Admit: 2022-10-09 | Discharge: 2022-10-09 | Disposition: A | Discharge status: Home / Self Care | Payer: Commercial Managed Care - PPO | Source: Ambulatory Visit | Attending: Gastroenterology | Admitting: Gastroenterology

## 2022-10-09 ENCOUNTER — Encounter (HOSPITAL_BASED_OUTPATIENT_CLINIC_OR_DEPARTMENT_OTHER): Payer: Self-pay

## 2022-10-09 ENCOUNTER — Other Ambulatory Visit: Payer: Self-pay | Admitting: Internal Medicine

## 2022-10-09 ENCOUNTER — Other Ambulatory Visit (HOSPITAL_COMMUNITY): Payer: Self-pay

## 2022-10-09 DIAGNOSIS — R634 Abnormal weight loss: Secondary | ICD-10-CM

## 2022-10-09 DIAGNOSIS — K5730 Diverticulosis of large intestine without perforation or abscess without bleeding: Secondary | ICD-10-CM | POA: Diagnosis not present

## 2022-10-09 MED ORDER — IOHEXOL 300 MG/ML  SOLN
100.0000 mL | Freq: Once | INTRAMUSCULAR | Status: AC | PRN
  Administered 2022-10-09: 100 mL via INTRAVENOUS

## 2022-10-10 ENCOUNTER — Other Ambulatory Visit (HOSPITAL_COMMUNITY): Payer: Self-pay

## 2022-10-10 MED ORDER — OZEMPIC (0.25 OR 0.5 MG/DOSE) 2 MG/3ML ~~LOC~~ SOPN
0.5000 mg | PEN_INJECTOR | SUBCUTANEOUS | 1 refills | Status: DC
  Filled 2022-10-10 – 2022-11-01 (×2): qty 9, 84d supply, fill #0
  Filled 2023-01-31: qty 9, 84d supply, fill #1

## 2022-10-12 ENCOUNTER — Other Ambulatory Visit (HOSPITAL_COMMUNITY): Payer: Self-pay

## 2022-10-17 ENCOUNTER — Telehealth: Payer: Self-pay | Admitting: Gastroenterology

## 2022-10-17 NOTE — Telephone Encounter (Signed)
Inbound call from patient returning phone call regarding recent imaging results. Requesting a call back. Please advise, thank you.

## 2022-10-17 NOTE — Telephone Encounter (Signed)
Pt requesting recent results from CT. Spoke with pt Documented in result notes.  Pt verbalized understanding with all questions answered.

## 2022-10-19 ENCOUNTER — Encounter: Payer: Self-pay | Admitting: Gastroenterology

## 2022-10-19 ENCOUNTER — Other Ambulatory Visit (HOSPITAL_COMMUNITY): Payer: Self-pay

## 2022-10-19 ENCOUNTER — Other Ambulatory Visit: Payer: Self-pay | Admitting: *Deleted

## 2022-10-19 DIAGNOSIS — A048 Other specified bacterial intestinal infections: Secondary | ICD-10-CM

## 2022-10-19 MED ORDER — METRONIDAZOLE 500 MG PO TABS
500.0000 mg | ORAL_TABLET | Freq: Two times a day (BID) | ORAL | 0 refills | Status: AC
  Filled 2022-10-19: qty 28, 14d supply, fill #0

## 2022-10-19 MED ORDER — OMEPRAZOLE 20 MG PO CPDR
DELAYED_RELEASE_CAPSULE | ORAL | 0 refills | Status: DC
  Filled 2022-10-19 – 2023-01-15 (×2): qty 72, 58d supply, fill #0

## 2022-10-19 MED ORDER — CLARITHROMYCIN 500 MG PO TABS
500.0000 mg | ORAL_TABLET | Freq: Two times a day (BID) | ORAL | 0 refills | Status: AC
  Filled 2022-10-19: qty 28, 14d supply, fill #0

## 2022-10-19 MED ORDER — AMOXICILLIN 500 MG PO CAPS
1000.0000 mg | ORAL_CAPSULE | Freq: Two times a day (BID) | ORAL | 0 refills | Status: AC
  Filled 2022-10-19: qty 56, 14d supply, fill #0

## 2022-11-01 ENCOUNTER — Other Ambulatory Visit (HOSPITAL_COMMUNITY): Payer: Self-pay

## 2022-11-02 ENCOUNTER — Other Ambulatory Visit (HOSPITAL_COMMUNITY): Payer: Self-pay

## 2022-11-13 ENCOUNTER — Other Ambulatory Visit (HOSPITAL_COMMUNITY): Payer: Self-pay

## 2022-11-13 ENCOUNTER — Encounter: Payer: Self-pay | Admitting: Internal Medicine

## 2022-11-13 ENCOUNTER — Ambulatory Visit: Payer: Commercial Managed Care - PPO | Admitting: Internal Medicine

## 2022-11-13 VITALS — BP 138/70 | HR 67 | Ht 64.0 in | Wt 184.0 lb

## 2022-11-13 DIAGNOSIS — E113592 Type 2 diabetes mellitus with proliferative diabetic retinopathy without macular edema, left eye: Secondary | ICD-10-CM | POA: Diagnosis not present

## 2022-11-13 DIAGNOSIS — E785 Hyperlipidemia, unspecified: Secondary | ICD-10-CM | POA: Diagnosis not present

## 2022-11-13 DIAGNOSIS — Z7985 Long-term (current) use of injectable non-insulin antidiabetic drugs: Secondary | ICD-10-CM

## 2022-11-13 DIAGNOSIS — Z7984 Long term (current) use of oral hypoglycemic drugs: Secondary | ICD-10-CM | POA: Diagnosis not present

## 2022-11-13 DIAGNOSIS — E1169 Type 2 diabetes mellitus with other specified complication: Secondary | ICD-10-CM | POA: Diagnosis not present

## 2022-11-13 DIAGNOSIS — Z6835 Body mass index (BMI) 35.0-35.9, adult: Secondary | ICD-10-CM | POA: Diagnosis not present

## 2022-11-13 LAB — MICROALBUMIN / CREATININE URINE RATIO
Creatinine,U: 82.2 mg/dL
Microalb Creat Ratio: 0.9 mg/g (ref 0.0–30.0)
Microalb, Ur: <0.7 mg/dL (ref 0.0–1.9)

## 2022-11-13 MED ORDER — METFORMIN HCL 500 MG PO TABS: 500.0000 mg | ORAL_TABLET | Freq: Every day | ORAL | 3 refills | Status: DC

## 2022-11-13 MED ORDER — EMPAGLIFLOZIN 10 MG PO TABS
10.0000 mg | ORAL_TABLET | Freq: Every day | ORAL | 3 refills | Status: AC
  Filled 2022-11-13: qty 90, 90d supply, fill #0

## 2022-11-13 NOTE — Progress Notes (Signed)
Patient ID: Katelyn Peterson, female   DOB: 1945-10-22, 77 y.o.   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 Last visit 6 months ago.  Interim history: No increased urination, blurry vision, nausea, chest pain. She has been on ABx for H. Pylori infection - finished course.  Reviewed HbA1c levels: 10/2022: HbA1c reportedly 5.6% Lab Results  Component Value Date   HGBA1C 6.2 09/19/2022   HGBA1C 5.8 (H) 04/18/2022   HGBA1C 5.9 (A) 11/07/2021   Pt is on a regimen of: - Metformin 1000 mg twice a day >> once a day - Jardiance 10 mg daily in am - Januvia 100 mg daily in am >> changed to Ozempic 0.5 mg weekly 10/2021 We stopped Amaryl 2 mg bid and Actos 30 mg daily for daily hypoglycemia events and possible SEs, respectively.  Pt checks her sugars once a day: - am:   83-111, 120 >> 87-100 >> 78, 87-106, 124 - 2h after b'fast: n/c >> 131 >> n/c >> 114 >> n/c - before lunch: n/c >> 93 >> 100-110 >> n/c - 2h after lunch:  110, 121 >> n/c >> 93-99 >> n/c - before dinner: 105, 115 >> n/c >> 102 >> n/c - 2h after dinner: 135, 135 >> n/c - bedtime:   90 >> 70 >> n/c Lowest sugar was 83 >> 87 >> 78;  she has hypoglycemia awareness in the 60s. Highest sugar was  120 >> 100 >> 124 x1.  -No CKD, last BUN/creatinine:  Lab Results  Component Value Date   BUN 13 10/05/2022   CREATININE 1.01 10/05/2022  On quinapril 40.  Normal ACR: Lab Results  Component Value Date   MICRALBCREAT 1.4 06/08/2020   MICRALBCREAT 2.3 06/09/2019   MICRALBCREAT 0.9 06/10/2018   MICRALBCREAT 1.5 01/07/2017   MICRALBCREAT 0.9 05/08/2016   -+ HL; last set of lipids: Lab Results  Component Value Date   CHOL 155 12/18/2021   HDL 56.80 12/18/2021   LDLCALC 86 12/18/2021   LDLDIRECT 146.5 09/18/2012   TRIG 62.0 12/18/2021   CHOLHDL 3 12/18/2021  She had leg cramps with  statins: Rosuvastatin, pravastatin, atorvastatin and fluvastatin XL.  She refused to try Zetia.  Now on Lipitor low-dose every other day (or sometimes 2x a week) and she uses a muscle rub cream for cramps.  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 Ree Edman.  - She denies numbness and tingling in her feet.  Last foot exam 05/11/2022.  She has a history of HTN. She has been considered for lap band surgery >> she did not want to follow through with this. She also has bilateral chronic lower extremity cramps, right knee pain, anemia.  Latest TSH was normal: Lab Results  Component Value Date   TSH 1.75 12/18/2021   ROS: + see HPI  I reviewed pt's medications, allergies, PMH, social hx, family hx, and changes were documented in the history of present illness. Otherwise, unchanged from my initial visit note.  Past Medical History:  Diagnosis Date   Arthritis    HTN (hypertension)    Hyperlipidemia    Obesity    Type II or unspecified type diabetes mellitus without mention of complication, not stated as uncontrolled    Past Surgical History:  Procedure Laterality Date   ABDOMINAL HYSTERECTOMY  1985   Social History   Socioeconomic History  Marital status: Married    Spouse name: Not on file   Number of children: Not on file   Years of education: Not on file   Highest education level: Not on file  Occupational History   Occupation: [employer]  Tobacco Use   Smoking status: Never   Smokeless tobacco: Never  Vaping Use   Vaping status: Never Used  Substance and Sexual Activity   Alcohol use: No   Drug use: No   Sexual activity: Yes    Partners: Male  Other Topics Concern   Not on file  Social History Narrative   Regular exercise: seldom   Caffeine use: occasionally         Social Determinants of Health   Financial Resource Strain: Not on file  Food Insecurity: Not on file  Transportation Needs: Not on file  Physical Activity: Not  on file  Stress: Not on file  Social Connections: Not on file  Intimate Partner Violence: Not on file   Current Outpatient Medications on File Prior to Visit  Medication Sig Dispense Refill   aspirin EC 81 MG tablet Take 81 mg by mouth daily. Swallow whole.     atorvastatin (LIPITOR) 20 MG tablet TAKE 1 TABLET BY MOUTH ONCE DAILY (Patient taking differently: Take 20 mg by mouth 3 (three) times a week.) 90 tablet 3   Cholecalciferol 1000 UNITS tablet Take 1,000 Units by mouth daily.     Cyanocobalamin (VITAMIN B-12) 1000 MCG SUBL Place 1 tablet (1,000 mcg total) under the tongue daily. 100 tablet 3   empagliflozin (JARDIANCE) 10 MG TABS tablet Take 1 tablet (10 mg total) by mouth daily before breakfast. 30 tablet 5   Iron, Ferrous Sulfate, 325 (65 Fe) MG TABS Take 1 tablet by mouth daily. 30 tablet 3   lisinopril (ZESTRIL) 40 MG tablet Take 1 tablet (40 mg total) by mouth daily. 90 tablet 3   metFORMIN (GLUCOPHAGE) 1000 MG tablet Take 1 tablet (1,000 mg total) by mouth daily. 90 tablet 3   omeprazole (PRILOSEC) 20 MG capsule Take 1 capsule (20 mg total) by mouth 2 (two) times daily for 14 days, THEN 1 capsule (20 mg total) daily for 14 days, THEN 1 capsule (20 mg total) daily. 72 capsule 0   Semaglutide,0.25 or 0.5MG /DOS, (OZEMPIC, 0.25 OR 0.5 MG/DOSE,) 2 MG/3ML SOPN Inject 0.5 mg into the skin once a week. 9 mL 1   spironolactone (ALDACTONE) 50 MG tablet Take 1 tablet (50 mg total) by mouth daily. 90 tablet 3   traMADol (ULTRAM) 50 MG tablet Take 1 tablet (50 mg total) by mouth 4 (four) times daily. (Patient taking differently: Take 50 mg by mouth every 6 (six) hours as needed for moderate pain.) 100 tablet 2   verapamil (VERELAN) 240 MG 24 hr capsule Take 1 capsule (240 mg total) by mouth at bedtime. 90 capsule 3   No current facility-administered medications on file prior to visit.   Allergies  Allergen Reactions   Atorvastatin     REACTION: cramps   Hydrochlorothiazide     REACTION:  Cramps   Pravastatin     cramps   Family History  Problem Relation Age of Onset   Hypertension Mother    Diabetes Mother    Hypertension Father    Hypertension Other    Colon cancer Neg Hx    Esophageal cancer Neg Hx    Rectal cancer Neg Hx    Stomach cancer Neg Hx    PE: BP 138/70  Pulse 67   Ht 5\' 4"  (1.626 m)   Wt 184 lb (83.5 kg)   SpO2 99%   BMI 31.58 kg/m    Wt Readings from Last 3 Encounters:  11/13/22 184 lb (83.5 kg)  10/05/22 186 lb (84.4 kg)  10/01/22 186 lb (84.4 kg)   Constitutional: overweight, in NAD Eyes: EOMI, no exophthalmos ENT:  no thyromegaly, no cervical lymphadenopathy Cardiovascular: RRR, No MRG, + mild peri ankle swelling bilaterally, pitting Respiratory: CTA B Musculoskeletal: no deformities Skin:  no rashes  Neurological: + tremor with outstretched hands  Assessment: 1. 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 Hyperlipidemia - Developed leg cramps with pravastatin, atorvastatin, Crestor, fluvastatin  3.  Obesity class II  4. Goiter by palpation 11/04/2012: Thyroid U/S: Right thyroid lobe: 4.3 x 1.0 x 1.8 cm  Left thyroid lobe: 4.6 x 1.2 x 1.9 cm  Isthmus: 5 mm  Focal nodules: Thyroid echotexture is homogeneous, without solid or cystic lesion.  Lymphadenopathy: None visualized.  IMPRESSION:  Normal thyroid ultrasound. No evidence of thyromegaly or dominant solid/cystic mass.  -No further intervention needed for this  PLAN:  1. DM2 -Patient with longstanding, well-controlled, type 2 diabetes, on metformin, she has inhibitor and GLP-1 receptor agonist, changed from a DPP 4 inhibitor per her request.  HbA1c remains controlled, but at last check, in 09/2022, this was slightly higher, at 6.2%.  At last visit, HbA1c was 5.8%, sugars were excellent, under 100, and she was interested in stopping metformin.  I advised her to reduce the dose and then stop if the sugars remained controlled. -At today's visit,  sugars are excellent in the morning but she is not checking at all later in the day.  She also mentions that she had another HbA1c obtained last month and this was much better, at 5.6%, at work.  We did ask about the importance of checking some sugars later in the day, at least before the appointment to establish trends, but, at today's visit, since she is interested in coming off metformin, we discussed about decreasing the dose to 500 mg daily and at next visit, after she gathers some sugars later in the day, we may be able to stop completely.  She agrees with this plan. - I suggested to:  Patient Instructions  Please reduce: - Metformin 500 mg 1x a day   Continue: - Jardiance 10 mg in am - Ozempic 0.5 mg weekly  Please come back for a follow-up appointment in 6 months.  - advised to check sugars at different times of the day - 1x a day, rotating check times - advised for yearly eye exams >> she is UTD - return to clinic in 6 months  2. Hyperlipidemia -Reviewed latest lipid panel from 12/2021: LDL above target, otherwise fractions at goal: Lab Results  Component Value Date   CHOL 155 12/18/2021   HDL 56.80 12/18/2021   LDLCALC 86 12/18/2021   LDLDIRECT 146.5 09/18/2012   TRIG 62.0 12/18/2021   CHOLHDL 3 12/18/2021  -She had leg cramps from low-dose Crestor, pravastatin, atorvastatin, fluvastatin XL.  However, at last visit, she was able to tolerate Lipitor 10 mg every 2 to 3 days.  Currently taking it ~every other day.  3.  Obesity class II -Will continue Ozempic and Jardiance which should both help with weight loss -Weight was stable at the last 2 visits, previously lost 17 pounds after starting Ozempic - weight approx stable at this visit: 184 lbs at home, 188 lbs  here in clinic  Carlus Pavlov, MD PhD [facility]

## 2022-11-13 NOTE — Patient Instructions (Addendum)
Please reduce: - Metformin 500 mg 1x a day   Continue: - Jardiance 10 mg in am - Ozempic 0.5 mg weekly  Please come back for a follow-up appointment in 6 months.

## 2022-11-15 DIAGNOSIS — Z1231 Encounter for screening mammogram for malignant neoplasm of breast: Secondary | ICD-10-CM | POA: Diagnosis not present

## 2022-11-15 LAB — HM MAMMOGRAPHY

## 2022-11-23 ENCOUNTER — Other Ambulatory Visit (HOSPITAL_COMMUNITY): Payer: Self-pay

## 2022-11-23 ENCOUNTER — Telehealth: Payer: Self-pay | Admitting: Internal Medicine

## 2022-11-23 NOTE — Telephone Encounter (Signed)
Pt called asking about a medical clearance and something else he suppose to be filling something out not sure please reach out to pt to get better understanding. Leave VM if she don't answer

## 2022-11-27 NOTE — Telephone Encounter (Signed)
Tried calling pt no answer. I am needing more details on what pt is needing.

## 2022-12-03 ENCOUNTER — Other Ambulatory Visit (HOSPITAL_COMMUNITY): Payer: Self-pay

## 2022-12-07 DIAGNOSIS — H2511 Age-related nuclear cataract, right eye: Secondary | ICD-10-CM | POA: Insufficient documentation

## 2022-12-07 DIAGNOSIS — H25013 Cortical age-related cataract, bilateral: Secondary | ICD-10-CM | POA: Diagnosis not present

## 2022-12-07 DIAGNOSIS — E113293 Type 2 diabetes mellitus with mild nonproliferative diabetic retinopathy without macular edema, bilateral: Secondary | ICD-10-CM | POA: Diagnosis not present

## 2022-12-07 DIAGNOSIS — H43813 Vitreous degeneration, bilateral: Secondary | ICD-10-CM | POA: Diagnosis not present

## 2022-12-07 DIAGNOSIS — H524 Presbyopia: Secondary | ICD-10-CM | POA: Diagnosis not present

## 2022-12-07 DIAGNOSIS — H35043 Retinal micro-aneurysms, unspecified, bilateral: Secondary | ICD-10-CM | POA: Diagnosis not present

## 2022-12-07 DIAGNOSIS — H2513 Age-related nuclear cataract, bilateral: Secondary | ICD-10-CM | POA: Diagnosis not present

## 2022-12-07 DIAGNOSIS — H52203 Unspecified astigmatism, bilateral: Secondary | ICD-10-CM | POA: Diagnosis not present

## 2022-12-07 DIAGNOSIS — Z7984 Long term (current) use of oral hypoglycemic drugs: Secondary | ICD-10-CM | POA: Diagnosis not present

## 2022-12-07 DIAGNOSIS — H5203 Hypermetropia, bilateral: Secondary | ICD-10-CM | POA: Diagnosis not present

## 2022-12-07 DIAGNOSIS — E119 Type 2 diabetes mellitus without complications: Secondary | ICD-10-CM | POA: Diagnosis not present

## 2022-12-11 NOTE — Telephone Encounter (Signed)
Surgical clearance form received via fax from [facility] & placed in provider box up front.

## 2022-12-12 NOTE — Telephone Encounter (Signed)
I was able to place forms in folder and place on providers desk.

## 2022-12-20 ENCOUNTER — Ambulatory Visit: Payer: Commercial Managed Care - PPO | Admitting: Internal Medicine

## 2022-12-20 ENCOUNTER — Encounter: Payer: Self-pay | Admitting: Internal Medicine

## 2022-12-20 ENCOUNTER — Other Ambulatory Visit (HOSPITAL_COMMUNITY): Payer: Self-pay

## 2022-12-20 VITALS — BP 120/68 | HR 76 | Temp 98.0°F | Ht 64.0 in | Wt 193.0 lb

## 2022-12-20 DIAGNOSIS — N183 Chronic kidney disease, stage 3 unspecified: Secondary | ICD-10-CM

## 2022-12-20 DIAGNOSIS — I10 Essential (primary) hypertension: Secondary | ICD-10-CM

## 2022-12-20 DIAGNOSIS — E113592 Type 2 diabetes mellitus with proliferative diabetic retinopathy without macular edema, left eye: Secondary | ICD-10-CM | POA: Diagnosis not present

## 2022-12-20 DIAGNOSIS — Z01818 Encounter for other preprocedural examination: Secondary | ICD-10-CM

## 2022-12-20 DIAGNOSIS — E785 Hyperlipidemia, unspecified: Secondary | ICD-10-CM

## 2022-12-20 DIAGNOSIS — E1169 Type 2 diabetes mellitus with other specified complication: Secondary | ICD-10-CM | POA: Diagnosis not present

## 2022-12-20 DIAGNOSIS — N2889 Other specified disorders of kidney and ureter: Secondary | ICD-10-CM

## 2022-12-20 MED ORDER — TRAMADOL HCL 50 MG PO TABS
50.0000 mg | ORAL_TABLET | Freq: Four times a day (QID) | ORAL | 2 refills | Status: DC | PRN
  Filled 2022-12-20: qty 100, 25d supply, fill #0
  Filled 2023-04-13: qty 100, 25d supply, fill #1

## 2022-12-20 NOTE — Assessment & Plan Note (Signed)
Labs w/Dr Elvera Lennox

## 2022-12-20 NOTE — Progress Notes (Signed)
Subjective:  Patient ID: Katelyn Peterson, female    DOB: 11/13/1945  Age: 77 y.o. MRN: [id number]  CC: Follow-up (3 MNTH F/U)   HPI Katelyn Peterson presents for pre-op exam F/u DM, HTN, dyslipidemia  Outpatient Medications Prior to Visit  Medication Sig Dispense Refill   aspirin EC 81 MG tablet Take 81 mg by mouth daily. Swallow whole.     atorvastatin (LIPITOR) 20 MG tablet TAKE 1 TABLET BY MOUTH ONCE DAILY (Patient taking differently: Take 20 mg by mouth 3 (three) times a week.) 90 tablet 3   Cholecalciferol 1000 UNITS tablet Take 1,000 Units by mouth daily.     Cyanocobalamin (VITAMIN B-12) 1000 MCG SUBL Place 1 tablet (1,000 mcg total) under the tongue daily. 100 tablet 3   empagliflozin (JARDIANCE) 10 MG TABS tablet Take 1 tablet (10 mg total) by mouth daily before breakfast. 90 tablet 3   Iron, Ferrous Sulfate, 325 (65 Fe) MG TABS Take 1 tablet by mouth daily. 30 tablet 3   lisinopril (ZESTRIL) 40 MG tablet Take 1 tablet (40 mg total) by mouth daily. 90 tablet 3   metFORMIN (GLUCOPHAGE) 500 MG tablet Take 1 tablet (500 mg total) by mouth daily. 90 tablet 3   Semaglutide,0.25 or 0.5MG /DOS, (OZEMPIC, 0.25 OR 0.5 MG/DOSE,) 2 MG/3ML SOPN Inject 0.5 mg into the skin once a week. 9 mL 1   spironolactone (ALDACTONE) 50 MG tablet Take 1 tablet (50 mg total) by mouth daily. 90 tablet 3   verapamil (VERELAN) 240 MG 24 hr capsule Take 1 capsule (240 mg total) by mouth at bedtime. 90 capsule 3   traMADol (ULTRAM) 50 MG tablet Take 1 tablet (50 mg total) by mouth 4 (four) times daily. (Patient taking differently: Take 50 mg by mouth every 6 (six) hours as needed for moderate pain.) 100 tablet 2   omeprazole (PRILOSEC) 20 MG capsule Take 1 capsule (20 mg total) by mouth 2 (two) times daily for 14 days, THEN 1 capsule (20 mg total) daily for 14 days, THEN 1 capsule (20 mg total) daily. 72 capsule 0   No facility-administered medications prior to visit.    ROS: Review of Systems   Constitutional:  Negative for activity change, appetite change, chills, fatigue and unexpected weight change.  HENT:  Negative for congestion, mouth sores and sinus pressure.   Eyes:  Negative for visual disturbance.  Respiratory:  Negative for cough and chest tightness.   Gastrointestinal:  Negative for abdominal pain and nausea.  Genitourinary:  Negative for difficulty urinating, frequency and vaginal pain.  Musculoskeletal:  Positive for arthralgias. Negative for back pain and gait problem.  Skin:  Negative for pallor and rash.  Neurological:  Negative for dizziness, tremors, weakness, numbness and headaches.  Psychiatric/Behavioral:  Negative for confusion, sleep disturbance and suicidal ideas.     Objective:  BP 120/68 (BP Location: Left Arm, Patient Position: Sitting, Cuff Size: Normal)   Pulse 76   Temp 98 F (36.7 C) (Oral)   Ht 5\' 4"  (1.626 m)   Wt 193 lb (87.5 kg)   SpO2 94%   BMI 33.13 kg/m   BP Readings from Last 3 Encounters:  12/20/22 120/68  11/13/22 138/70  10/05/22 132/75    Wt Readings from Last 3 Encounters:  12/20/22 193 lb (87.5 kg)  11/13/22 184 lb (83.5 kg)  10/05/22 186 lb (84.4 kg)    Physical Exam Constitutional:      General: She is not in acute distress.  Appearance: She is well-developed.  HENT:     Head: Normocephalic.     Right Ear: External ear normal.     Left Ear: External ear normal.     Nose: Nose normal.  Eyes:     General:        Right eye: No discharge.        Left eye: No discharge.     Conjunctiva/sclera: Conjunctivae normal.     Pupils: Pupils are equal, round, and reactive to light.  Neck:     Thyroid: No thyromegaly.     Vascular: No JVD.     Trachea: No tracheal deviation.  Cardiovascular:     Rate and Rhythm: Normal rate and regular rhythm.     Heart sounds: Normal heart sounds.  Pulmonary:     Effort: No respiratory distress.     Breath sounds: No stridor. No wheezing.  Abdominal:     General: Bowel sounds  are normal. There is no distension.     Palpations: Abdomen is soft. There is no mass.     Tenderness: There is no abdominal tenderness. There is no guarding or rebound.  Musculoskeletal:        General: Tenderness present.     Cervical back: Normal range of motion and neck supple. No rigidity.     Right lower leg: No edema.     Left lower leg: No edema.  Lymphadenopathy:     Cervical: No cervical adenopathy.  Skin:    Findings: No erythema or rash.  Neurological:     Mental Status: She is oriented to person, place, and time.     Cranial Nerves: No cranial nerve deficit.     Motor: No abnormal muscle tone.     Coordination: Coordination normal.     Deep Tendon Reflexes: Reflexes normal.  Psychiatric:        Behavior: Behavior normal.        Thought Content: Thought content normal.        Judgment: Judgment normal.   R knee w/pain  Lab Results  Component Value Date   WBC 4.7 10/05/2022   HGB 11.5 (L) 10/05/2022   HCT 35.7 (L) 10/05/2022   PLT 232.0 10/05/2022   GLUCOSE 86 10/05/2022   CHOL 155 12/18/2021   TRIG 62.0 12/18/2021   HDL 56.80 12/18/2021   LDLDIRECT 146.5 09/18/2012   LDLCALC 86 12/18/2021   ALT 12 10/05/2022   AST 11 10/05/2022   NA 140 10/05/2022   K 3.7 10/05/2022   CL 106 10/05/2022   CREATININE 1.01 10/05/2022   BUN 13 10/05/2022   CO2 29 10/05/2022   TSH 1.75 12/18/2021   HGBA1C 6.2 09/19/2022   MICROALBUR <0.7 11/13/2022    CT ABDOMEN PELVIS W CONTRAST  Result Date: 10/15/2022 CLINICAL DATA:  100 lb weight loss in past 2 years.  Anemia. EXAM: CT ABDOMEN AND PELVIS WITH CONTRAST TECHNIQUE: Multidetector CT imaging of the abdomen and pelvis was performed using the standard protocol following bolus administration of intravenous contrast. RADIATION DOSE REDUCTION: This exam was performed according to the departmental dose-optimization program which includes automated exposure control, adjustment of the mA and/or kV according to patient size and/or use  of iterative reconstruction technique. CONTRAST:  OMNIPAQUE IOHEXOL 300 MG/ML  SOLN COMPARISON:  None Available. FINDINGS: Lower Chest: No acute findings. Hepatobiliary: No suspicious hepatic masses identified. Gallbladder is unremarkable. No evidence of biliary ductal dilatation. Pancreas:  No mass or inflammatory changes. Spleen: Within normal limits in  size and appearance. Adrenals/Urinary Tract: No suspicious masses identified. No evidence of ureteral calculi or hydronephrosis. Stomach/Bowel: No evidence of obstruction, inflammatory process or abnormal fluid collections. Normal appendix visualized. Diverticulosis is seen mainly involving the descending and sigmoid colon, however there is no evidence of diverticulitis. Vascular/Lymphatic: No pathologically enlarged lymph nodes. No acute vascular findings. Reproductive:  No mass or other significant abnormality. Other:  None. Musculoskeletal:  No suspicious bone lesions identified. IMPRESSION: No evidence of malignancy or other acute findings. Colonic diverticulosis, without radiographic evidence of diverticulitis. Electronically Signed   By: Danae Orleans M.D.   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)    Labs w/Dr Elvera Lennox      Dyslipidemia associated with type 2 diabetes mellitus (HCC)    Chronic Cont on Lipitor      Essential hypertension - Primary    Spironolactone, quinapril, Verapamil Take Aleve very infrequently GFR 44      CRI (chronic renal insufficiency), stage 3 (moderate) (HCC)    GFR 37 Hydrate better Nephrology ref - pt declined On Jardiance, [facility] Labs pre-op       Preop exam for internal medicine    Labs pre-op Should be clear for R knee TKR in 03/2023         Meds ordered this encounter  Medications   traMADol (ULTRAM) 50 MG tablet    Sig: Take 1 tablet (50 mg  total) by mouth every 6 (six) hours as needed.    Dispense:  100 tablet    Refill:  2      Follow-up: Return in about 6 months (around 06/20/2023) for Wellness Exam.  Sonda Primes, MD

## 2022-12-20 NOTE — Assessment & Plan Note (Signed)
Chronic. Cont on Lipitor 

## 2022-12-20 NOTE — Assessment & Plan Note (Signed)
Labs pre-op Should be clear for R knee TKR in 03/2023

## 2022-12-20 NOTE — Assessment & Plan Note (Signed)
Spironolactone, quinapril, Verapamil Take Aleve very infrequently GFR 44

## 2022-12-20 NOTE — Assessment & Plan Note (Signed)
GFR 37 Hydrate better Nephrology ref - pt declined On Jardiance, [facility] Labs pre-op

## 2022-12-21 ENCOUNTER — Other Ambulatory Visit (HOSPITAL_COMMUNITY): Payer: Self-pay

## 2022-12-21 ENCOUNTER — Other Ambulatory Visit: Payer: Self-pay

## 2022-12-28 ENCOUNTER — Other Ambulatory Visit (HOSPITAL_COMMUNITY): Payer: Self-pay

## 2023-01-15 ENCOUNTER — Encounter: Payer: Self-pay | Admitting: *Deleted

## 2023-01-15 ENCOUNTER — Other Ambulatory Visit (HOSPITAL_COMMUNITY): Payer: Self-pay

## 2023-01-15 ENCOUNTER — Telehealth: Payer: Self-pay | Admitting: Gastroenterology

## 2023-01-15 ENCOUNTER — Other Ambulatory Visit: Payer: Self-pay | Admitting: Physician Assistant

## 2023-01-15 DIAGNOSIS — A048 Other specified bacterial intestinal infections: Secondary | ICD-10-CM

## 2023-01-15 MED ORDER — OMEPRAZOLE 20 MG PO CPDR
20.0000 mg | DELAYED_RELEASE_CAPSULE | Freq: Every day | ORAL | 3 refills | Status: DC
  Filled 2023-01-15: qty 90, 90d supply, fill #0
  Filled 2023-04-09: qty 90, 90d supply, fill #1
  Filled 2023-06-27: qty 90, 90d supply, fill #2
  Filled 2023-10-14: qty 90, 90d supply, fill #3

## 2023-01-15 NOTE — Telephone Encounter (Signed)
Medication sent and will touch basis on retesting

## 2023-01-15 NOTE — Telephone Encounter (Signed)
Inbound call from patient stating that she has a refill for Prilosec for 58 days and is needing a 902 day supply. Please advise.

## 2023-01-15 NOTE — Progress Notes (Signed)
[facility] H pylori ordered for patient

## 2023-01-15 NOTE — Telephone Encounter (Signed)
LVM doesn't makes sense but prilosec is written for daily for 30 days. Need to speak to patient about this. Can send in prilosec daily if that what she is asking but alos needs to see if he follow up with retesting for h pylori

## 2023-01-16 ENCOUNTER — Other Ambulatory Visit (HOSPITAL_COMMUNITY): Payer: Self-pay

## 2023-01-16 NOTE — Telephone Encounter (Signed)
Patient is aware about getting stool kit from the office now.

## 2023-01-17 ENCOUNTER — Ambulatory Visit: Payer: Commercial Managed Care - PPO

## 2023-01-17 DIAGNOSIS — A048 Other specified bacterial intestinal infections: Secondary | ICD-10-CM

## 2023-01-24 ENCOUNTER — Telehealth: Payer: Self-pay | Admitting: *Deleted

## 2023-01-24 NOTE — Telephone Encounter (Signed)
[facility] stool sample was sent in on 01/17/23, due to pilot lab form, results were not received. Called [facility] and was informed the testing panel was not noted as well as there not having a requisition form noted. Was also informed via [facility], there was no need to do another sample, the stool sample that was received has already been tested and they have the results. Was explained to the nurse that all was needed at this time would be the Laboratory Request form and the insurance faxed to the [facility] labs at [phone]. Information faxed.

## 2023-01-25 ENCOUNTER — Telehealth: Payer: Self-pay

## 2023-01-25 NOTE — Telephone Encounter (Signed)
Patient returned call and aptient is aware of negative H pylori [facility] stool test.

## 2023-01-25 NOTE — Telephone Encounter (Signed)
Attempted to contact patient and left a voicemail tor return call regarding [facility] test.

## 2023-01-25 NOTE — Telephone Encounter (Signed)
Eradication study H pylori Negative 01/17/2023

## 2023-01-31 ENCOUNTER — Other Ambulatory Visit (HOSPITAL_COMMUNITY): Payer: Self-pay

## 2023-02-22 IMAGING — US US RENAL
1 series · 14 of 25 positions shown · non-contrast
Comparison: None.

CLINICAL DATA: Chronic kidney disease.

EXAM:
RENAL / URINARY TRACT ULTRASOUND COMPLETE

[Series 1: us renal · 0.23mm/px · 14 of 51 slices shown]
[im 1/51]
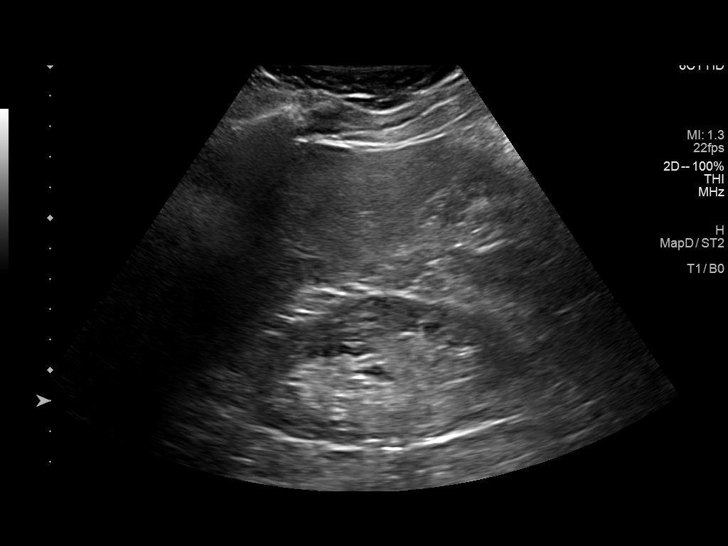
[im 5/51]
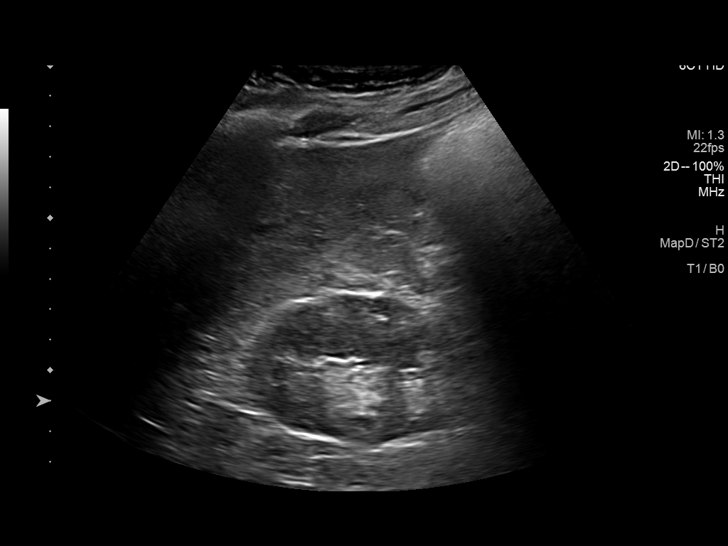
[im 9/51]
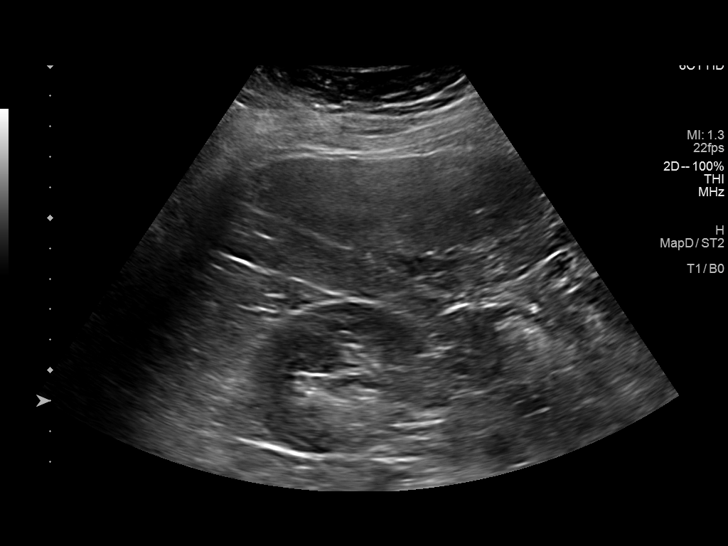
[im 13/51]
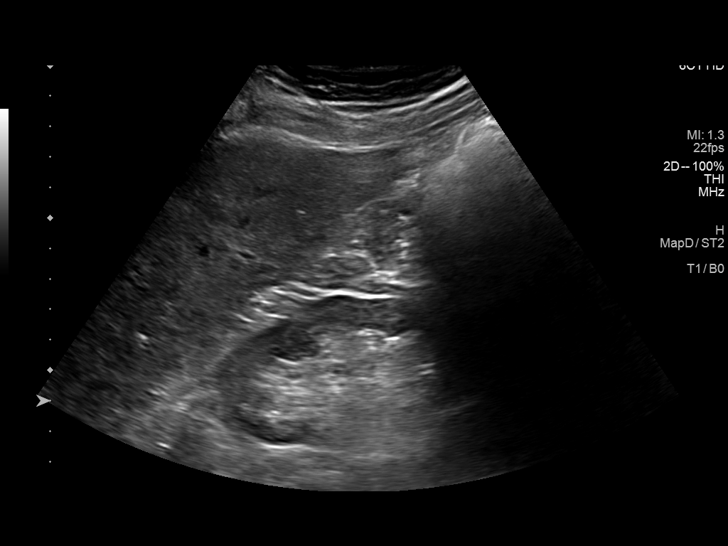
[im 17/51]
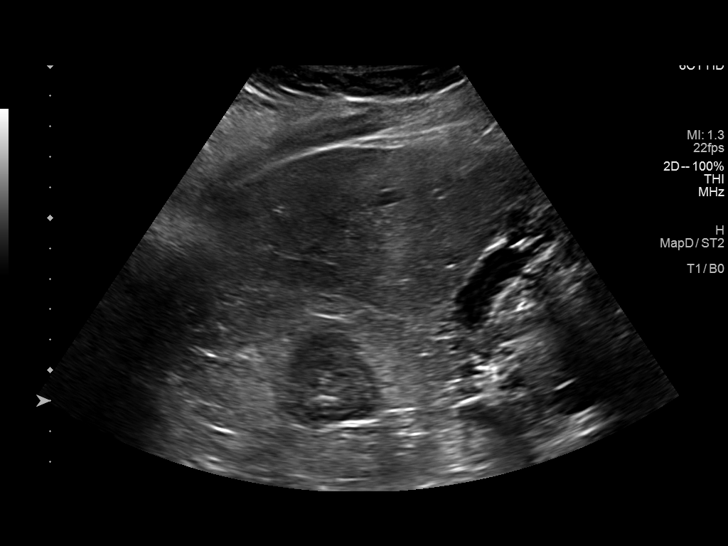
[im 19/51]
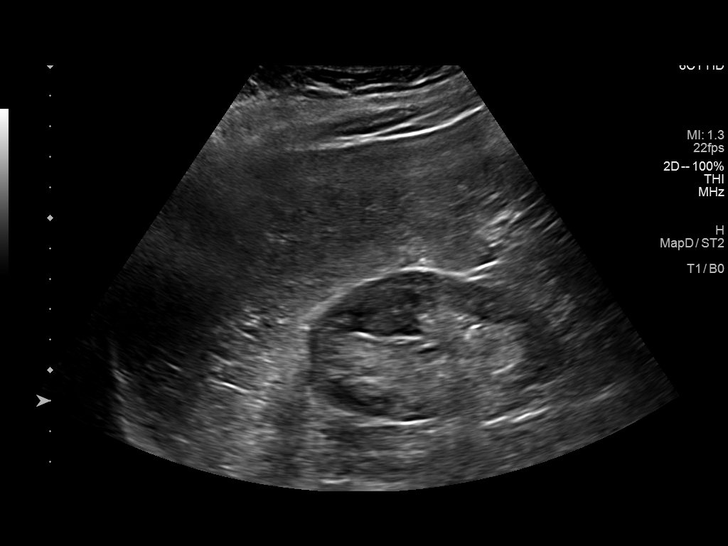
[im 23/51]
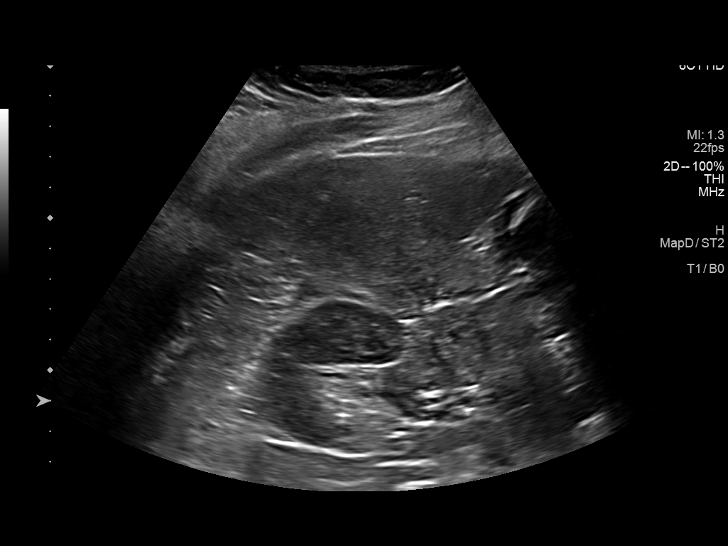
[im 28/51]
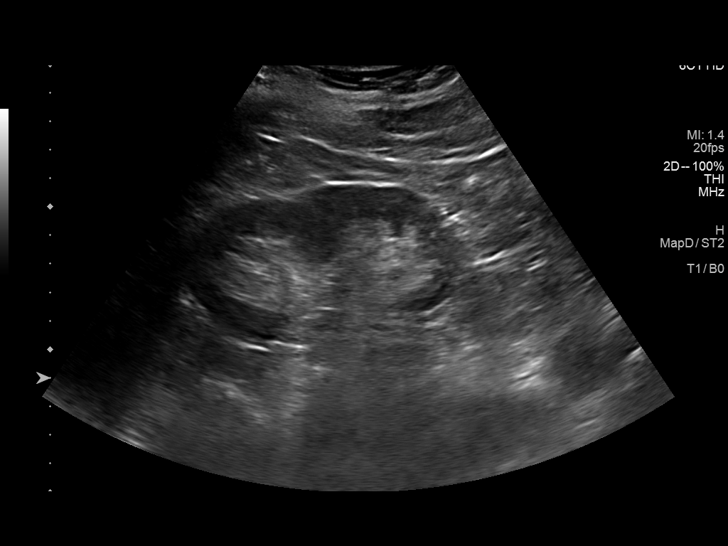
[im 32/51]
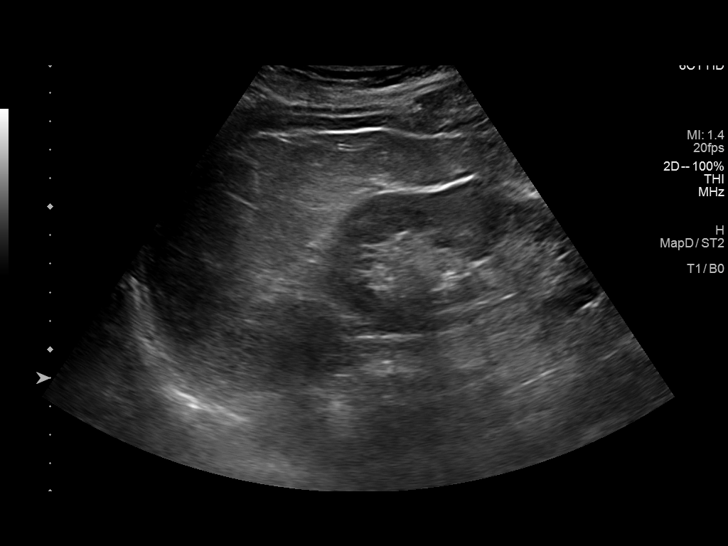
[im 34/51]
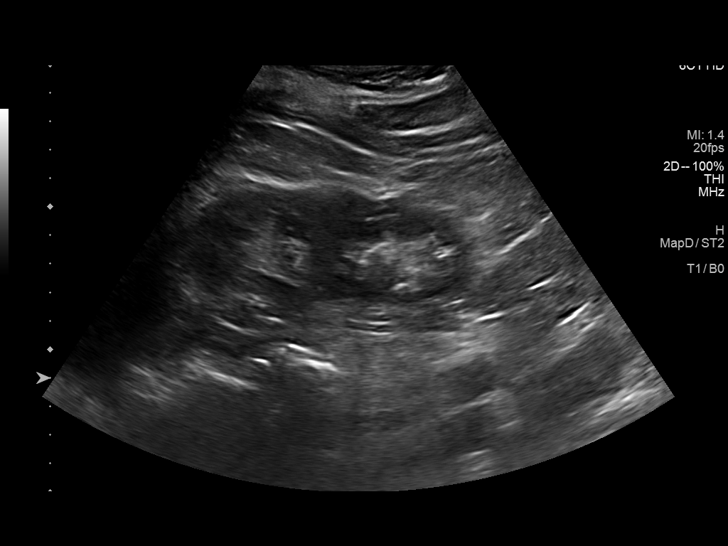
[im 38/51]
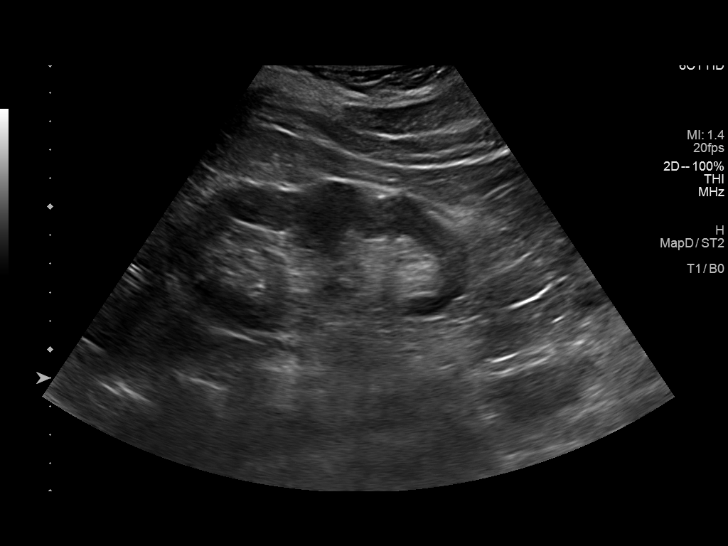
[im 42/51]
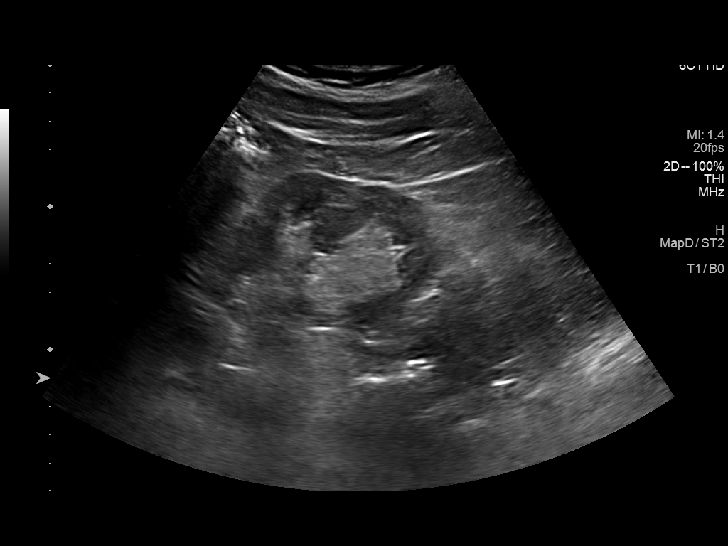
[im 46/51]
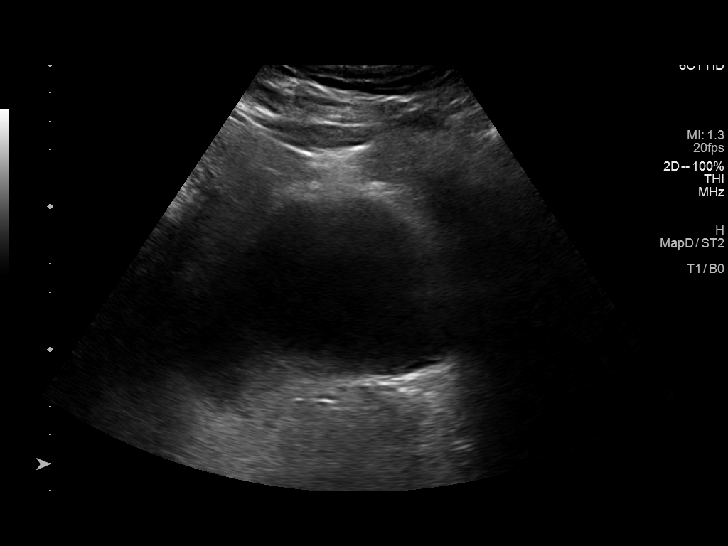
[im 51/51]
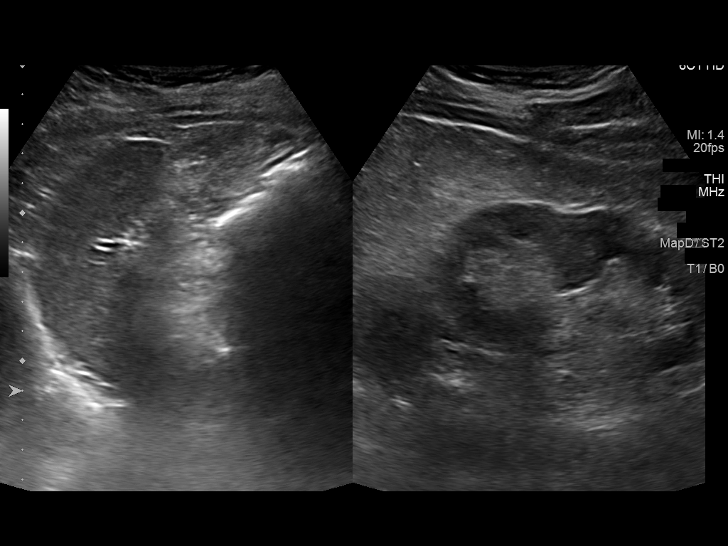

[14 of 25 positions shown; findings below may reference images not displayed]

FINDINGS: Right Kidney:

Renal measurements: 9.4 x 4.8 x 6.0 cm = volume: 140 mL. Mild
cortical thinning. Echogenicity within normal limits. No mass or
hydronephrosis visualized.

Left Kidney:

Renal measurements: 10.3 x 5.6 x 6.8 cm = volume: 205 mL.
Echogenicity within normal limits. No mass or hydronephrosis
visualized.

Bladder:

Appears normal for degree of bladder distention.

Other:

None.
IMPRESSION: 1. Mild right renal cortical thinning.

## 2023-02-27 NOTE — H&P (Signed)
TOTAL KNEE ADMISSION H&P  Patient is being admitted for right total knee arthroplasty.  Subjective:  Chief Complaint: Right knee pain.  HPI: Katelyn Peterson, 77 y.o. female has a history of pain and functional disability in the right knee due to arthritis and has failed non-surgical conservative treatments for greater than 12 weeks to include NSAID's and/or analgesics, corticosteriod injections, and activity modification. Onset of symptoms was gradual, starting  several  years ago with gradually worsening course since that time. The patient noted no past surgery on the right knee.  Patient currently rates pain in the right knee at 9 out of 10 with activity. Patient has night pain, worsening of pain with activity and weight bearing, pain with passive range of motion, and crepitus. Patient has evidence of  advanced bone on bone formation  by imaging studies. There is no active infection.  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   Well adult exam 08/16/2014   Goiter 11/04/2012   Left ankle pain 08/22/2011   Grief 04/20/2011   Rhinitis 04/20/2011   ABDOMINAL DISTENSION 03/09/2010   KNEE PAIN 03/04/2008   LEG CRAMPS 09/03/2007   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 Authorizing Provider  aspirin EC 81 MG tablet Take 81 mg by mouth daily. Swallow whole.    [provider]  atorvastatin (LIPITOR) 20 MG tablet TAKE 1 TABLET BY MOUTH ONCE DAILY Patient taking differently: Take 20 mg by mouth 3 (three) times a week. 04/11/22 04/11/23  Plotnikov, Georgina Quint, MD  Cholecalciferol 1000 UNITS tablet Take 1,000 Units by mouth daily.    [provider]  Cyanocobalamin (VITAMIN B-12) 1000 MCG SUBL Place 1 tablet (1,000 mcg total) under the tongue daily. 09/19/22   Plotnikov, Georgina Quint, MD  empagliflozin (JARDIANCE) 10 MG TABS tablet Take 1 tablet (10 mg total) by mouth daily before breakfast. 11/13/22   Carlus Pavlov, MD  Iron, Ferrous Sulfate, 325 (65 Fe) MG TABS Take 1 tablet by mouth daily. 04/22/22   Plotnikov, Georgina Quint, MD  lisinopril (ZESTRIL) 40 MG tablet Take 1 tablet (40 mg total) by mouth daily. 03/23/22   Plotnikov, Georgina Quint, MD  metFORMIN (GLUCOPHAGE) 500 MG tablet Take 1 tablet (500 mg total) by mouth daily. 11/13/22 11/13/23  Carlus Pavlov, MD  omeprazole (PRILOSEC) 20 MG capsule Take 1 capsule (20 mg total) by mouth 2 (two) times daily for 14 days, THEN 1 capsule (20 mg total) daily for 14 days, THEN 1 capsule (20  mg total) daily. 10/19/22 12/16/22  Lynann Bologna, MD  omeprazole (PRILOSEC) 20 MG capsule Take 1 capsule (20 mg total) by mouth daily. 01/15/23   Lynann Bologna, MD  Semaglutide,0.25 or 0.5MG /DOS, (OZEMPIC, 0.25 OR 0.5 MG/DOSE,) 2 MG/3ML SOPN Inject 0.5 mg into the skin once a week. 10/10/22   Carlus Pavlov, MD  spironolactone (ALDACTONE) 50 MG tablet Take 1 tablet (50 mg total) by mouth daily. 02/13/22   Plotnikov, Georgina Quint, MD  traMADol (ULTRAM) 50 MG tablet Take 1 tablet  (50 mg total) by mouth every 6 (six) hours as needed. 12/20/22   Plotnikov, Georgina Quint, MD  verapamil (VERELAN) 240 MG 24 hr capsule Take 1 capsule (240 mg total) by mouth at bedtime. 01/15/22   Plotnikov, Georgina Quint, MD    Allergies  Allergen Reactions   Atorvastatin     REACTION: cramps   Hydrochlorothiazide     REACTION: Cramps   Pravastatin     cramps    Social History   Socioeconomic History   Marital status: Married    Spouse name: Not on file   Number of children: Not on file   Years of education: Not on file   Highest education level: Not on file  Occupational History   Occupation: Katelyn Peterson  Tobacco Use   Smoking status: Never   Smokeless tobacco: Never  Vaping Use   Vaping status: Never Used  Substance and Sexual Activity   Alcohol use: No   Drug use: No   Sexual activity: Yes    Partners: Male  Other Topics Concern   Not on file  Social History Narrative   Regular exercise: seldom   Caffeine use: occasionally         Social Drivers of Corporate investment banker Strain: Not on file  Food Insecurity: Not on file  Transportation Needs: Not on file  Physical Activity: Not on file  Stress: Not on file  Social Connections: Not on file  Intimate Partner Violence: Not on file    Tobacco Use: Low Risk  (12/20/2022)   Patient History    Smoking Tobacco Use: Never    Smokeless Tobacco Use: Never    Passive Exposure: Not on file   Social History   Substance and Sexual Activity  Alcohol Use No    Family History  Problem Relation Age of Onset   Hypertension Mother    Diabetes Mother    Hypertension Father    Hypertension Other    Colon cancer Neg Hx    Esophageal cancer Neg Hx    Rectal cancer Neg Hx    Stomach cancer Neg Hx     Review of Systems  Constitutional:  Negative for chills and fever.  HENT:  Negative for congestion, sore throat and tinnitus.   Eyes:  Negative for double vision, photophobia and pain.  Respiratory:   Negative for cough, shortness of breath and wheezing.   Cardiovascular:  Negative for chest pain, palpitations and orthopnea.  Gastrointestinal:  Negative for heartburn, nausea and vomiting.  Genitourinary:  Negative for dysuria, frequency and urgency.  Musculoskeletal:  Positive for joint pain.  Neurological:  Negative for dizziness, weakness and headaches.    Objective:  Physical Exam: Well nourished and well developed.  General: Alert and oriented x3, cooperative and pleasant, no acute distress.  Head: normocephalic, atraumatic, neck supple.  Eyes: EOMI.  Musculoskeletal:  Right Knee Exam: No effusion present. No swelling present. The range of motion is: 5 to 120 degrees. Moderate  crepitus on range of motion of the knee. Positive medial joint line tenderness. No lateral joint line tenderness. The knee is stable.  Calves soft and nontender. Motor function intact in LE. Strength 5/5 LE bilaterally. Neuro: Distal pulses 2+. Sensation to light touch intact in LE.  Imaging Review Plain radiographs demonstrate severe degenerative joint disease of the right knee. The overall alignment is neutral. The bone quality appears to be adequate for age and reported activity level.  Assessment/Plan:  End stage arthritis, right knee   The patient history, physical examination, clinical judgment of the provider and imaging studies are consistent with end stage degenerative joint disease of the right knee and total knee arthroplasty is deemed medically necessary. The treatment options including medical management, injection therapy arthroscopy and arthroplasty were discussed at length. The risks and benefits of total knee arthroplasty were presented and reviewed. The risks due to aseptic loosening, infection, stiffness, patella tracking problems, thromboembolic complications and other imponderables were discussed. The patient acknowledged the explanation, agreed to proceed with the plan and consent  was signed. Patient is being admitted for inpatient treatment for surgery, pain control, PT, OT, prophylactic antibiotics, VTE prophylaxis, progressive ambulation and ADLs and discharge planning. The patient is planning to be discharged  home .   Patient's anticipated LOS is less than 2 midnights, meeting these requirements: - Lives within 1 hour of care - Has a competent adult at home to recover with post-op recover - NO history of  - Chronic pain requiring opiods  - Coronary Artery Disease  - Heart failure  - Heart attack  - Stroke  - DVT/VTE  - Cardiac arrhythmia  - Respiratory Failure/COPD  - Renal failure  - Advanced Liver disease  Therapy Plans: Outpatient therapy at [facility]) Disposition: Home with grandson & husband Planned DVT Prophylaxis: Aspirin 81 mg BID DME Needed: Dan Humphreys PCP: Jacinta Shoe, MD (clearance received) TXA: IV Allergies: NKDA Metal Allergy: None Anesthesia Concerns: None BMI: 34.5 Last HgbA1c: 5.8% (02/2023) Pain Regimen: Oxycodone, tramadol Pharmacy: [facility]  Other: Order elevated toilet seat  - Patient was instructed on what medications to stop prior to surgery. - Follow-up visit in 2 weeks with Dr. Lequita Halt - Begin physical therapy following surgery - Pre-operative lab work as pre-surgical testing - Prescriptions will be provided in hospital at time of discharge  Arther Abbott, PA-C Orthopedic Surgery [facility] [place]

## 2023-03-11 NOTE — Patient Instructions (Signed)
 DUE TO COVID-19 ONLY TWO VISITORS  (aged 76 and older)  ARE ALLOWED TO COME WITH YOU AND STAY IN THE WAITING ROOM ONLY DURING PRE OP AND PROCEDURE.   **NO VISITORS ARE ALLOWED IN THE SHORT STAY AREA OR RECOVERY ROOM!!**  IF YOU WILL BE ADMITTED INTO THE HOSPITAL YOU ARE ALLOWED ONLY FOUR SUPPORT PEOPLE DURING VISITATION HOURS ONLY (7 AM -8PM)   The support person(s) must pass our screening, gel in and out, and wear a mask at all times, including in the patient's room. Patients must also wear a mask when staff or their support person are in the room. Visitors GUEST BADGE MUST BE WORN VISIBLY  One adult visitor may remain with you overnight and MUST be in the room by 8 P.M.     Your procedure is scheduled on: 03/18/23   Report to [facility] Main Entrance    Report to admitting at : 10:10 AM   Call this number if you have problems the morning of surgery [phone]   Do not eat food :After Midnight.   After Midnight you may have the following liquids until : 9:30 AM DAY OF SURGERY  Water  Black Coffee (sugar ok, NO MILK/CREAM OR CREAMERS)  Tea (sugar ok, NO MILK/CREAM OR CREAMERS) regular and decaf                             Plain Jell-O (NO RED)                                           Fruit ices (not with fruit pulp, NO RED)                                     Popsicles (NO RED)                                                                  Juice: apple, WHITE grape, WHITE cranberry Sports drinks like Gatorade (NO RED)   The day of surgery:  Drink ONE (1) Pre-Surgery Clear G2 at : 9:30 AM the morning of surgery. Drink in one sitting. Do not sip.  This drink was given to you during your hospital  pre-op appointment visit. Nothing else to drink after completing the  Pre-Surgery Clear Ensure or G2.          If you have questions, please contact your surgeon's office.  FOLLOW ANY ADDITIONAL PRE OP INSTRUCTIONS YOU RECEIVED FROM YOUR SURGEON'S OFFICE!!!   Oral Hygiene  is also important to reduce your risk of infection.                                    Remember - BRUSH YOUR TEETH THE MORNING OF SURGERY WITH YOUR REGULAR TOOTHPASTE  DENTURES WILL BE REMOVED PRIOR TO SURGERY PLEASE DO NOT APPLY Poly grip OR ADHESIVES!!!   Do NOT smoke after Midnight   Take these medicines the morning of surgery  with A SIP OF WATER : omeprazole .  How to Manage Your Diabetes Before and After Surgery  Why is it important to control my blood sugar before and after surgery? Improving blood sugar levels before and after surgery helps healing and can limit problems. A way of improving blood sugar control is eating a healthy diet by:  Eating less sugar and carbohydrates  Increasing activity/exercise  Talking with your doctor about reaching your blood sugar goals High blood sugars (greater than 180 mg/dL) can raise your risk of infections and slow your recovery, so you will need to focus on controlling your diabetes during the weeks before surgery. Make sure that the doctor who takes care of your diabetes knows about your planned surgery including the date and location.  How do I manage my blood sugar before surgery? Check your blood sugar at least 4 times a day, starting 2 days before surgery, to make sure that the level is not too high or low. Check your blood sugar the morning of your surgery when you wake up and every 2 hours until you get to the Short Stay unit. If your blood sugar is less than 70 mg/dL, you will need to treat for low blood sugar: Do not take insulin . Treat a low blood sugar (less than 70 mg/dL) with  cup of clear juice (cranberry or apple), 4 glucose tablets, OR glucose gel. Recheck blood sugar in 15 minutes after treatment (to make sure it is greater than 70 mg/dL). If your blood sugar is not greater than 70 mg/dL on recheck, call [phone] for further instructions. Report your blood sugar to the short stay nurse when you get to Short Stay.  If you  are admitted to the hospital after surgery: Your blood sugar will be checked by the staff and you will probably be given insulin  after surgery (instead of oral diabetes medicines) to make sure you have good blood sugar levels. The goal for blood sugar control after surgery is 80-180 mg/dL.   WHAT DO I DO ABOUT MY DIABETES MEDICATION?  HOLD Jardiance  after: 03/14/23  THE MORNING OF SURGERY, DO NOT TAKE ANY ORAL DIABETIC MEDICATIONS DAY OF YOUR SURGERY  DO NOT TAKE THE FOLLOWING 7 DAYS PRIOR TO SURGERY: Ozempic , Wegovy , Rybelsus  (Semaglutide ), Byetta (exenatide), Bydureon (exenatide ER), Victoza, Saxenda (liraglutide), or Trulicity (dulaglutide) Mounjaro (Tirzepatide) Adlyxin (Lixisenatide), Polyethylene Glycol Loxenatide. HOLD Mounjaro after:03/10/23  Bring CPAP mask and tubing day of surgery.                              You may not have any metal on your body including hair pins, jewelry, and body piercing             Do not wear make-up, lotions, powders, perfumes/cologne, or deodorant  Do not wear nail polish including gel and S&S, artificial/acrylic nails, or any other type of covering on natural nails including finger and toenails. If you have artificial nails, gel coating, etc. that needs to be removed by a nail salon please have this removed prior to surgery or surgery may need to be canceled/ delayed if the surgeon/ anesthesia feels like they are unable to be safely monitored.   Do not shave  48 hours prior to surgery.    Do not bring valuables to the hospital. [facility] IS NOT             RESPONSIBLE   FOR VALUABLES.   Contacts, glasses, or  bridgework may not be worn into surgery.   Bring small overnight bag day of surgery.   DO NOT BRING YOUR HOME MEDICATIONS TO THE HOSPITAL. PHARMACY WILL DISPENSE MEDICATIONS LISTED ON YOUR MEDICATION LIST TO YOU DURING YOUR ADMISSION IN THE HOSPITAL!    Patients discharged on the day of surgery will not be allowed to drive home.  Someone  NEEDS to stay with you for the first 24 hours after anesthesia.   Special Instructions: Bring a copy of your healthcare power of attorney and living will documents         the day of surgery if you haven't scanned them before.              Please read over the following fact sheets you were given: IF YOU HAVE QUESTIONS ABOUT YOUR PRE-OP INSTRUCTIONS PLEASE CALL [phone]      Pre-operative 5 CHG Bath Instructions   You can play a key role in reducing the risk of infection after surgery. Your skin needs to be as free of germs as possible. You can reduce the number of germs on your skin by washing with CHG (chlorhexidine  gluconate) soap before surgery. CHG is an antiseptic soap that kills germs and continues to kill germs even after washing.   DO NOT use if you have an allergy to chlorhexidine /CHG or antibacterial soaps. If your skin becomes reddened or irritated, stop using the CHG and notify one of our RNs at : [phone].   Please shower with the CHG soap starting 4 days before surgery using the following schedule:     Please keep in mind the following:  DO NOT shave, including legs and underarms, starting the day of your first shower.   You may shave your face at any point before/day of surgery.  Place clean sheets on your bed the day you start using CHG soap. Use a clean washcloth (not used since being washed) for each shower. DO NOT sleep with pets once you start using the CHG.   CHG Shower Instructions:  If you choose to wash your hair and private area, wash first with your normal shampoo/soap.  After you use shampoo/soap, rinse your hair and body thoroughly to remove shampoo/soap residue.  Turn the water  OFF and apply about 3 tablespoons (45 ml) of CHG soap to a CLEAN washcloth.  Apply CHG soap ONLY FROM YOUR NECK DOWN TO YOUR TOES (washing for 3-5 minutes)  DO NOT use CHG soap on face, private areas, open wounds, or sores.  Pay special attention to the area where your  surgery is being performed.  If you are having back surgery, having someone wash your back for you may be helpful. Wait 2 minutes after CHG soap is applied, then you may rinse off the CHG soap.  Pat dry with a clean towel  Put on clean clothes/pajamas   If you choose to wear lotion, please use ONLY the CHG-compatible lotions on the back of this paper.     Additional instructions for the day of surgery: DO NOT APPLY any lotions, deodorants, cologne, or perfumes.   Put on clean/comfortable clothes.  Brush your teeth.  Ask your nurse before applying any prescription medications to the skin.   CHG Compatible Lotions   Aveeno Moisturizing lotion  Cetaphil Moisturizing Cream  Cetaphil Moisturizing Lotion  Clairol Herbal Essence Moisturizing Lotion, Dry Skin  Clairol Herbal Essence Moisturizing Lotion, Extra Dry Skin  Clairol Herbal Essence Moisturizing Lotion, Normal Skin  Curel Age Defying  Therapeutic Moisturizing Lotion with Alpha Hydroxy  Curel Extreme Care Body Lotion  Curel Soothing Hands Moisturizing Hand Lotion  Curel Therapeutic Moisturizing Cream, Fragrance-Free  Curel Therapeutic Moisturizing Lotion, Fragrance-Free  Curel Therapeutic Moisturizing Lotion, Original Formula  Eucerin Daily Replenishing Lotion  Eucerin Dry Skin Therapy Plus Alpha Hydroxy Crme  Eucerin Dry Skin Therapy Plus Alpha Hydroxy Lotion  Eucerin Original Crme  Eucerin Original Lotion  Eucerin Plus Crme Eucerin Plus Lotion  Eucerin TriLipid Replenishing Lotion  Keri Anti-Bacterial Hand Lotion  Keri Deep Conditioning Original Lotion Dry Skin Formula Softly Scented  Keri Deep Conditioning Original Lotion, Fragrance Free Sensitive Skin Formula  Keri Lotion Fast Absorbing Fragrance Free Sensitive Skin Formula  Keri Lotion Fast Absorbing Softly Scented Dry Skin Formula  Keri Original Lotion  Keri Skin Renewal Lotion Keri Silky Smooth Lotion  Keri Silky Smooth Sensitive Skin Lotion  Nivea Body Creamy  Conditioning Oil  Nivea Body Extra Enriched Lotion  Nivea Body Original Lotion  Nivea Body Sheer Moisturizing Lotion Nivea Crme  Nivea Skin Firming Lotion  NutraDerm 30 Skin Lotion  NutraDerm Skin Lotion  NutraDerm Therapeutic Skin Cream  NutraDerm Therapeutic Skin Lotion  ProShield Protective Hand Cream  Provon moisturizing lotion   Incentive Spirometer  An incentive spirometer is a tool that can help keep your lungs clear and active. This tool measures how well you are filling your lungs with each breath. Taking long deep breaths may help reverse or decrease the chance of developing breathing (pulmonary) problems (especially infection) following: A long period of time when you are unable to move or be active. BEFORE THE PROCEDURE  If the spirometer includes an indicator to show your best effort, your nurse or respiratory therapist will set it to a desired goal. If possible, sit up straight or lean slightly forward. Try not to slouch. Hold the incentive spirometer in an upright position. INSTRUCTIONS FOR USE  Sit on the edge of your bed if possible, or sit up as far as you can in bed or on a chair. Hold the incentive spirometer in an upright position. Breathe out normally. Place the mouthpiece in your mouth and seal your lips tightly around it. Breathe in slowly and as deeply as possible, raising the piston or the ball toward the top of the column. Hold your breath for 3-5 seconds or for as long as possible. Allow the piston or ball to fall to the bottom of the column. Remove the mouthpiece from your mouth and breathe out normally. Rest for a few seconds and repeat Steps 1 through 7 at least 10 times every 1-2 hours when you are awake. Take your time and take a few normal breaths between deep breaths. The spirometer may include an indicator to show your best effort. Use the indicator as a goal to work toward during each repetition. After each set of 10 deep breaths, practice coughing  to be sure your lungs are clear. If you have an incision (the cut made at the time of surgery), support your incision when coughing by placing a pillow or rolled up towels firmly against it. Once you are able to get out of bed, walk around indoors and cough well. You may stop using the incentive spirometer when instructed by your caregiver.  RISKS AND COMPLICATIONS Take your time so you do not get dizzy or light-headed. If you are in pain, you may need to take or ask for pain medication before doing incentive spirometry. It is harder to take a deep breath if  you are having pain. AFTER USE Rest and breathe slowly and easily. It can be helpful to keep track of a log of your progress. Your caregiver can provide you with a simple table to help with this. If you are using the spirometer at home, follow these instructions: SEEK MEDICAL CARE IF:  You are having difficultly using the spirometer. You have trouble using the spirometer as often as instructed. Your pain medication is not giving enough relief while using the spirometer. You develop fever of 100.5 F (38.1 C) or higher. SEEK IMMEDIATE MEDICAL CARE IF:  You cough up bloody sputum that had not been present before. You develop fever of 102 F (38.9 C) or greater. You develop worsening pain at or near the incision site. MAKE SURE YOU:  Understand these instructions. Will watch your condition. Will get help right away if you are not doing well or get worse. Document Released: 07/09/2006 Document Revised: 05/21/2011 Document Reviewed: 09/09/2006 [facility] Patient Information 2014 [place], [place].   ________________________________________________________________________

## 2023-03-12 ENCOUNTER — Encounter (HOSPITAL_COMMUNITY)
Admission: RE | Admit: 2023-03-12 | Discharge: 2023-03-12 | Disposition: A | Discharge status: Home / Self Care | Payer: Commercial Managed Care - PPO | Source: Ambulatory Visit | Attending: Orthopedic Surgery | Admitting: Orthopedic Surgery

## 2023-03-12 ENCOUNTER — Other Ambulatory Visit: Payer: Self-pay

## 2023-03-12 ENCOUNTER — Encounter (HOSPITAL_COMMUNITY): Payer: Self-pay

## 2023-03-12 VITALS — BP 140/67 | HR 66 | Temp 97.9°F | Ht 64.5 in | Wt 194.0 lb

## 2023-03-12 DIAGNOSIS — I10 Essential (primary) hypertension: Secondary | ICD-10-CM | POA: Insufficient documentation

## 2023-03-12 DIAGNOSIS — Z01818 Encounter for other preprocedural examination: Secondary | ICD-10-CM

## 2023-03-12 DIAGNOSIS — E113592 Type 2 diabetes mellitus with proliferative diabetic retinopathy without macular edema, left eye: Secondary | ICD-10-CM | POA: Insufficient documentation

## 2023-03-12 DIAGNOSIS — Z794 Long term (current) use of insulin: Secondary | ICD-10-CM | POA: Insufficient documentation

## 2023-03-12 DIAGNOSIS — Z01812 Encounter for preprocedural laboratory examination: Secondary | ICD-10-CM | POA: Insufficient documentation

## 2023-03-12 HISTORY — DX: Anemia, unspecified: D64.9

## 2023-03-12 LAB — BASIC METABOLIC PANEL
Anion gap: 6 (ref 5–15)
BUN: 34 mg/dL — ABNORMAL HIGH (ref 8–23)
CO2: 21 mmol/L — ABNORMAL LOW (ref 22–32)
Calcium: 9.3 mg/dL (ref 8.9–10.3)
Chloride: 112 mmol/L — ABNORMAL HIGH (ref 98–111)
Creatinine, Ser: 1.05 mg/dL — ABNORMAL HIGH (ref 0.44–1.00)
GFR, Estimated: 55 mL/min — ABNORMAL LOW (ref >60)
Glucose, Bld: 96 mg/dL (ref 70–99)
Potassium: 4.8 mmol/L (ref 3.5–5.1)
Sodium: 139 mmol/L (ref 135–145)

## 2023-03-12 LAB — SURGICAL PCR SCREEN
MRSA, PCR: NEGATIVE
Staphylococcus aureus: NEGATIVE

## 2023-03-12 LAB — CBC
HCT: 35.9 % — ABNORMAL LOW (ref 36.0–46.0)
Hemoglobin: 11.7 g/dL — ABNORMAL LOW (ref 12.0–15.0)
MCH: 31.5 pg (ref 26.0–34.0)
MCHC: 32.6 g/dL (ref 30.0–36.0)
MCV: 96.5 fL (ref 80.0–100.0)
Platelets: 244 10*3/uL (ref 150–400)
RBC: 3.72 MIL/uL — ABNORMAL LOW (ref 3.87–5.11)
RDW: 13.2 % (ref 11.5–15.5)
WBC: 6.4 10*3/uL (ref 4.0–10.5)
nRBC: 0 % (ref 0.0–0.2)

## 2023-03-12 LAB — GLUCOSE, CAPILLARY: Glucose-Capillary: 85 mg/dL (ref 70–99)

## 2023-03-12 LAB — HEMOGLOBIN A1C
Hgb A1c MFr Bld: 6 % — ABNORMAL HIGH (ref 4.8–5.6)
Mean Plasma Glucose: 126 mg/dL

## 2023-03-12 NOTE — Progress Notes (Signed)
 For Anesthesia: PCP - Plotnikov, Karlynn GAILS, MD. LOV: 12/20/22 Cardiologist - N/A  Bowel Prep reminder:  Chest x-ray -  EKG - 04/18/22 Stress Test -  ECHO -  Cardiac Cath -  Pacemaker/ICD device last checked: Pacemaker orders received: Device Rep notified:  Spinal Cord Stimulator:  Sleep Study -  CPAP -   Fasting Blood Sugar - 80's Checks Blood Sugar ___1__ times a day Date and result of last Hgb A1c-  Last dose of GLP1 agonist- Semaglutide . Last dose: 02/26/23 GLP1 instructions: To keep it on hold.  Last dose of SGLT-2 inhibitors- Jardiance  SGLT-2 instructions: To hold after: 03/13/22  Blood Thinner Instructions: Aspirin  Instructions: will be hold 5 days before surgery. Last Dose:  Activity level: Can go up a flight of stairs and activities of daily living without stopping and without chest pain and/or shortness of breath   Able to exercise without chest pain and/or shortness of breath  Anesthesia review: Hx: HTN,DIA.  Patient denies shortness of breath, fever, cough and chest pain at PAT appointment   Patient verbalized understanding of instructions that were given to them at the PAT appointment. Patient was also instructed that they will need to review over the PAT instructions again at home before surgery.

## 2023-03-18 ENCOUNTER — Ambulatory Visit (HOSPITAL_COMMUNITY): Payer: Commercial Managed Care - PPO

## 2023-03-18 ENCOUNTER — Observation Stay (HOSPITAL_COMMUNITY)
Admission: RE | Admit: 2023-03-18 | Discharge: 2023-03-19 | Disposition: A | Discharge status: Home / Self Care | Payer: Commercial Managed Care - PPO | Attending: Orthopedic Surgery | Admitting: Orthopedic Surgery

## 2023-03-18 ENCOUNTER — Encounter (HOSPITAL_COMMUNITY): Payer: Self-pay | Admitting: Orthopedic Surgery

## 2023-03-18 ENCOUNTER — Encounter (HOSPITAL_COMMUNITY)
Admission: RE | Disposition: A | Discharge status: Home / Self Care | Payer: Self-pay | Source: Home / Self Care | Attending: Orthopedic Surgery

## 2023-03-18 DIAGNOSIS — I10 Essential (primary) hypertension: Secondary | ICD-10-CM | POA: Diagnosis not present

## 2023-03-18 DIAGNOSIS — E1122 Type 2 diabetes mellitus with diabetic chronic kidney disease: Secondary | ICD-10-CM | POA: Diagnosis not present

## 2023-03-18 DIAGNOSIS — E119 Type 2 diabetes mellitus without complications: Secondary | ICD-10-CM | POA: Diagnosis not present

## 2023-03-18 DIAGNOSIS — I129 Hypertensive chronic kidney disease with stage 1 through stage 4 chronic kidney disease, or unspecified chronic kidney disease: Secondary | ICD-10-CM | POA: Diagnosis not present

## 2023-03-18 DIAGNOSIS — M1711 Unilateral primary osteoarthritis, right knee: Principal | ICD-10-CM | POA: Insufficient documentation

## 2023-03-18 DIAGNOSIS — Z7984 Long term (current) use of oral hypoglycemic drugs: Secondary | ICD-10-CM | POA: Insufficient documentation

## 2023-03-18 DIAGNOSIS — M179 Osteoarthritis of knee, unspecified: Principal | ICD-10-CM | POA: Diagnosis present

## 2023-03-18 DIAGNOSIS — Z79899 Other long term (current) drug therapy: Secondary | ICD-10-CM | POA: Diagnosis not present

## 2023-03-18 DIAGNOSIS — N183 Chronic kidney disease, stage 3 unspecified: Secondary | ICD-10-CM

## 2023-03-18 DIAGNOSIS — G8918 Other acute postprocedural pain: Secondary | ICD-10-CM | POA: Diagnosis not present

## 2023-03-18 DIAGNOSIS — Z7982 Long term (current) use of aspirin: Secondary | ICD-10-CM | POA: Diagnosis not present

## 2023-03-18 HISTORY — PX: TOTAL KNEE ARTHROPLASTY: SHX125

## 2023-03-18 LAB — GLUCOSE, CAPILLARY
Glucose-Capillary: 122 mg/dL — ABNORMAL HIGH (ref 70–99)
Glucose-Capillary: 93 mg/dL (ref 70–99)
Glucose-Capillary: 115 mg/dL — ABNORMAL HIGH (ref 70–99)
Glucose-Capillary: 93 mg/dL (ref 70–99)
Glucose-Capillary: 154 mg/dL — ABNORMAL HIGH (ref 70–99)
Glucose-Capillary: 262 mg/dL — ABNORMAL HIGH (ref 70–99)

## 2023-03-18 SURGERY — ARTHROPLASTY, KNEE, TOTAL
Anesthesia: Spinal | Site: Knee | Laterality: Right

## 2023-03-18 MED ORDER — BUPIVACAINE LIPOSOME 1.3 % IJ SUSP: 20.0000 mL | Freq: Once | INTRAMUSCULAR | Status: DC

## 2023-03-18 MED ORDER — PROPOFOL 500 MG/50ML IV EMUL
INTRAVENOUS | Status: DC | PRN
  Administered 2023-03-18: 100 ug/kg/min via INTRAVENOUS

## 2023-03-18 MED ORDER — TRAMADOL HCL 50 MG PO TABS: 50.0000 mg | ORAL_TABLET | Freq: Four times a day (QID) | ORAL | Status: DC | PRN

## 2023-03-18 MED ORDER — FLEET ENEMA RE ENEM
1.0000 | ENEMA | Freq: Once | RECTAL | Status: DC | PRN
Start: 2023-03-18 — End: 2023-03-19

## 2023-03-18 MED ORDER — DEXAMETHASONE SODIUM PHOSPHATE 10 MG/ML IJ SOLN
INTRAMUSCULAR | Status: AC
  Filled 2023-03-18: qty 1

## 2023-03-18 MED ORDER — ONDANSETRON HCL 4 MG/2ML IJ SOLN: 4.0000 mg | Freq: Four times a day (QID) | INTRAMUSCULAR | Status: DC | PRN

## 2023-03-18 MED ORDER — PHENOL 1.4 % MT LIQD: 1.0000 | OROMUCOSAL | Status: DC | PRN

## 2023-03-18 MED ORDER — OXYCODONE HCL 5 MG/5ML PO SOLN: 5.0000 mg | Freq: Once | ORAL | Status: DC | PRN

## 2023-03-18 MED ORDER — SODIUM CHLORIDE (PF) 0.9 % IJ SOLN
INTRAMUSCULAR | Status: AC
  Filled 2023-03-18: qty 50

## 2023-03-18 MED ORDER — PANTOPRAZOLE SODIUM 40 MG PO TBEC
40.0000 mg | DELAYED_RELEASE_TABLET | Freq: Every day | ORAL | Status: DC
  Administered 2023-03-19: 40 mg via ORAL
  Filled 2023-03-18: qty 1

## 2023-03-18 MED ORDER — POVIDONE-IODINE 10 % EX SWAB: 2.0000 | Freq: Once | CUTANEOUS | Status: DC

## 2023-03-18 MED ORDER — ORAL CARE MOUTH RINSE: 15.0000 mL | Freq: Once | OROMUCOSAL | Status: AC

## 2023-03-18 MED ORDER — EMPAGLIFLOZIN 10 MG PO TABS
10.0000 mg | ORAL_TABLET | Freq: Every day | ORAL | Status: DC
  Administered 2023-03-19: 10 mg via ORAL
  Filled 2023-03-18: qty 1

## 2023-03-18 MED ORDER — ACETAMINOPHEN 325 MG PO TABS: 325.0000 mg | ORAL_TABLET | Freq: Four times a day (QID) | ORAL | Status: DC | PRN

## 2023-03-18 MED ORDER — ACETAMINOPHEN 500 MG PO TABS
1000.0000 mg | ORAL_TABLET | Freq: Four times a day (QID) | ORAL | Status: DC
  Administered 2023-03-18 – 2023-03-19 (×3): 1000 mg via ORAL
  Filled 2023-03-18 (×3): qty 2

## 2023-03-18 MED ORDER — CEFAZOLIN SODIUM-DEXTROSE 2-4 GM/100ML-% IV SOLN
2.0000 g | Freq: Four times a day (QID) | INTRAVENOUS | Status: AC
  Administered 2023-03-18 (×2): 2 g via INTRAVENOUS
  Filled 2023-03-18 (×2): qty 100

## 2023-03-18 MED ORDER — DROPERIDOL 2.5 MG/ML IJ SOLN: 0.6250 mg | Freq: Once | INTRAMUSCULAR | Status: DC | PRN

## 2023-03-18 MED ORDER — SODIUM CHLORIDE 0.9 % IV SOLN: INTRAVENOUS | Status: DC

## 2023-03-18 MED ORDER — VERAPAMIL HCL ER 240 MG PO TBCR
240.0000 mg | EXTENDED_RELEASE_TABLET | Freq: Every day | ORAL | Status: DC
  Administered 2023-03-18: 240 mg via ORAL
  Filled 2023-03-18 (×2): qty 1

## 2023-03-18 MED ORDER — STERILE WATER FOR IRRIGATION IR SOLN
Status: DC | PRN
  Administered 2023-03-18: 1000 mL

## 2023-03-18 MED ORDER — METHOCARBAMOL 500 MG PO TABS
500.0000 mg | ORAL_TABLET | Freq: Four times a day (QID) | ORAL | Status: DC | PRN
  Administered 2023-03-18 – 2023-03-19 (×2): 500 mg via ORAL
  Filled 2023-03-18 (×2): qty 1

## 2023-03-18 MED ORDER — PHENYLEPHRINE 80 MCG/ML (10ML) SYRINGE FOR IV PUSH (FOR BLOOD PRESSURE SUPPORT)
PREFILLED_SYRINGE | INTRAVENOUS | Status: AC
  Filled 2023-03-18: qty 10

## 2023-03-18 MED ORDER — LACTATED RINGERS IV SOLN: INTRAVENOUS | Status: DC

## 2023-03-18 MED ORDER — OXYCODONE HCL 5 MG PO TABS
5.0000 mg | ORAL_TABLET | ORAL | Status: DC | PRN
Start: 2023-03-18 — End: 2023-03-19
  Administered 2023-03-18: 10 mg via ORAL
  Administered 2023-03-18: 5 mg via ORAL
  Administered 2023-03-19: 10 mg via ORAL
  Filled 2023-03-18: qty 2
  Filled 2023-03-18: qty 1
  Filled 2023-03-18: qty 2

## 2023-03-18 MED ORDER — INSULIN ASPART 100 UNIT/ML IJ SOLN: 0.0000 [IU] | Freq: Three times a day (TID) | INTRAMUSCULAR | Status: DC

## 2023-03-18 MED ORDER — ONDANSETRON HCL 4 MG/2ML IJ SOLN
INTRAMUSCULAR | Status: DC | PRN
  Administered 2023-03-18: 4 mg via INTRAVENOUS

## 2023-03-18 MED ORDER — ASPIRIN 81 MG PO CHEW
81.0000 mg | CHEWABLE_TABLET | Freq: Two times a day (BID) | ORAL | Status: DC
  Administered 2023-03-19: 81 mg via ORAL
  Filled 2023-03-18: qty 1

## 2023-03-18 MED ORDER — FENTANYL CITRATE PF 50 MCG/ML IJ SOSY
100.0000 ug | PREFILLED_SYRINGE | INTRAMUSCULAR | Status: DC
Start: 2023-03-18 — End: 2023-03-18
  Filled 2023-03-18: qty 2

## 2023-03-18 MED ORDER — ONDANSETRON HCL 4 MG/2ML IJ SOLN
INTRAMUSCULAR | Status: AC
  Filled 2023-03-18: qty 2

## 2023-03-18 MED ORDER — SODIUM CHLORIDE 0.9 % IR SOLN
Status: DC | PRN
  Administered 2023-03-18: 1000 mL

## 2023-03-18 MED ORDER — SODIUM CHLORIDE (PF) 0.9 % IJ SOLN
INTRAMUSCULAR | Status: AC
Start: 2023-03-18 — End: ?
  Filled 2023-03-18: qty 10

## 2023-03-18 MED ORDER — BUPIVACAINE LIPOSOME 1.3 % IJ SUSP
INTRAMUSCULAR | Status: AC
Start: 2023-03-18 — End: ?
  Filled 2023-03-18: qty 20

## 2023-03-18 MED ORDER — TRANEXAMIC ACID-NACL 1000-0.7 MG/100ML-% IV SOLN
1000.0000 mg | INTRAVENOUS | Status: AC
  Administered 2023-03-18: 1000 mg via INTRAVENOUS
  Filled 2023-03-18: qty 100

## 2023-03-18 MED ORDER — DOCUSATE SODIUM 100 MG PO CAPS
100.0000 mg | ORAL_CAPSULE | Freq: Two times a day (BID) | ORAL | Status: DC
  Administered 2023-03-18 – 2023-03-19 (×2): 100 mg via ORAL
  Filled 2023-03-18 (×2): qty 1

## 2023-03-18 MED ORDER — DIPHENHYDRAMINE HCL 12.5 MG/5ML PO ELIX
12.5000 mg | ORAL_SOLUTION | ORAL | Status: DC | PRN
  Administered 2023-03-18: 25 mg via ORAL
  Filled 2023-03-18: qty 10

## 2023-03-18 MED ORDER — INSULIN ASPART 100 UNIT/ML IJ SOLN
0.0000 [IU] | INTRAMUSCULAR | Status: DC | PRN
Start: 2023-03-18 — End: 2023-03-18

## 2023-03-18 MED ORDER — MENTHOL 3 MG MT LOZG: 1.0000 | LOZENGE | OROMUCOSAL | Status: DC | PRN

## 2023-03-18 MED ORDER — DEXAMETHASONE SODIUM PHOSPHATE 10 MG/ML IJ SOLN
8.0000 mg | Freq: Once | INTRAMUSCULAR | Status: AC
  Administered 2023-03-18: 8 mg via INTRAVENOUS

## 2023-03-18 MED ORDER — PROPOFOL 10 MG/ML IV BOLUS
INTRAVENOUS | Status: DC | PRN
  Administered 2023-03-18: 30 mg via INTRAVENOUS

## 2023-03-18 MED ORDER — BISACODYL 10 MG RE SUPP: 10.0000 mg | Freq: Every day | RECTAL | Status: DC | PRN

## 2023-03-18 MED ORDER — OXYCODONE HCL 5 MG PO TABS
10.0000 mg | ORAL_TABLET | ORAL | Status: DC | PRN
  Administered 2023-03-19: 10 mg via ORAL
  Administered 2023-03-19: 15 mg via ORAL
  Filled 2023-03-18 (×2): qty 2
  Filled 2023-03-18: qty 3

## 2023-03-18 MED ORDER — METOCLOPRAMIDE HCL 5 MG PO TABS: 5.0000 mg | ORAL_TABLET | Freq: Three times a day (TID) | ORAL | Status: DC | PRN

## 2023-03-18 MED ORDER — 0.9 % SODIUM CHLORIDE (POUR BTL) OPTIME
TOPICAL | Status: DC | PRN
  Administered 2023-03-18: 1000 mL

## 2023-03-18 MED ORDER — ONDANSETRON HCL 4 MG PO TABS: 4.0000 mg | ORAL_TABLET | Freq: Four times a day (QID) | ORAL | Status: DC | PRN

## 2023-03-18 MED ORDER — SODIUM CHLORIDE (PF) 0.9 % IJ SOLN
INTRAMUSCULAR | Status: DC | PRN
  Administered 2023-03-18: 60 mL

## 2023-03-18 MED ORDER — POLYETHYLENE GLYCOL 3350 17 G PO PACK: 17.0000 g | PACK | Freq: Every day | ORAL | Status: DC | PRN

## 2023-03-18 MED ORDER — ACETAMINOPHEN 10 MG/ML IV SOLN
1000.0000 mg | Freq: Four times a day (QID) | INTRAVENOUS | Status: DC
  Administered 2023-03-18: 1000 mg via INTRAVENOUS
  Filled 2023-03-18: qty 100

## 2023-03-18 MED ORDER — METHOCARBAMOL 1000 MG/10ML IJ SOLN: 500.0000 mg | Freq: Four times a day (QID) | INTRAMUSCULAR | Status: DC | PRN

## 2023-03-18 MED ORDER — METOCLOPRAMIDE HCL 5 MG/ML IJ SOLN
5.0000 mg | Freq: Three times a day (TID) | INTRAMUSCULAR | Status: DC | PRN
Start: 2023-03-18 — End: 2023-03-19

## 2023-03-18 MED ORDER — ACETAMINOPHEN 10 MG/ML IV SOLN
1000.0000 mg | Freq: Once | INTRAVENOUS | Status: DC | PRN
Start: 2023-03-18 — End: 2023-03-18

## 2023-03-18 MED ORDER — PHENYLEPHRINE 80 MCG/ML (10ML) SYRINGE FOR IV PUSH (FOR BLOOD PRESSURE SUPPORT)
PREFILLED_SYRINGE | INTRAVENOUS | Status: DC | PRN
  Administered 2023-03-18: 80 ug via INTRAVENOUS

## 2023-03-18 MED ORDER — SPIRONOLACTONE 25 MG PO TABS
50.0000 mg | ORAL_TABLET | Freq: Every day | ORAL | Status: DC
  Administered 2023-03-19: 50 mg via ORAL
  Filled 2023-03-18: qty 2

## 2023-03-18 MED ORDER — MORPHINE SULFATE (PF) 2 MG/ML IV SOLN
1.0000 mg | INTRAVENOUS | Status: DC | PRN
  Administered 2023-03-18: 1 mg via INTRAVENOUS
  Filled 2023-03-18: qty 1

## 2023-03-18 MED ORDER — PROPOFOL 1000 MG/100ML IV EMUL
INTRAVENOUS | Status: AC
Start: 2023-03-18 — End: ?
  Filled 2023-03-18: qty 100

## 2023-03-18 MED ORDER — CHLORHEXIDINE GLUCONATE 0.12 % MT SOLN
15.0000 mL | Freq: Once | OROMUCOSAL | Status: AC
Start: 2023-03-18 — End: 2023-03-18
  Administered 2023-03-18: 15 mL via OROMUCOSAL

## 2023-03-18 MED ORDER — OXYCODONE HCL 5 MG PO TABS
5.0000 mg | ORAL_TABLET | Freq: Once | ORAL | Status: DC | PRN
Start: 2023-03-18 — End: 2023-03-18

## 2023-03-18 MED ORDER — ROPIVACAINE HCL 5 MG/ML IJ SOLN
INTRAMUSCULAR | Status: DC | PRN
  Administered 2023-03-18: 25 mL via PERINEURAL

## 2023-03-18 MED ORDER — FENTANYL CITRATE PF 50 MCG/ML IJ SOSY: 25.0000 ug | PREFILLED_SYRINGE | INTRAMUSCULAR | Status: DC | PRN

## 2023-03-18 MED ORDER — MIDAZOLAM HCL 2 MG/2ML IJ SOLN
2.0000 mg | INTRAMUSCULAR | Status: AC
  Administered 2023-03-18: 2 mg via INTRAVENOUS
  Filled 2023-03-18: qty 2

## 2023-03-18 MED ORDER — CEFAZOLIN SODIUM-DEXTROSE 2-4 GM/100ML-% IV SOLN
2.0000 g | INTRAVENOUS | Status: AC
  Administered 2023-03-18: 2 g via INTRAVENOUS
  Filled 2023-03-18: qty 100

## 2023-03-18 SURGICAL SUPPLY — 44 items
ATTUNE PS FEM RT SZ 5 CEM KNEE (Femur) IMPLANT
BAG COUNTER SPONGE SURGICOUNT (BAG) IMPLANT
BAG ZIPLOCK 12X15 (MISCELLANEOUS) ×1 IMPLANT
BASEPLATE TIBIAL ROTATING SZ 4 (Knees) IMPLANT
BLADE SAG 18X100X1.27 (BLADE) ×1 IMPLANT
BLADE SAW SGTL 11.0X1.19X90.0M (BLADE) ×1 IMPLANT
BNDG ELASTIC 6INX 5YD STR LF (GAUZE/BANDAGES/DRESSINGS) ×1 IMPLANT
BNDG ELASTIC 6X10 VLCR STRL LF (GAUZE/BANDAGES/DRESSINGS) IMPLANT
BOWL SMART MIX CTS (DISPOSABLE) ×1 IMPLANT
CEMENT HV SMART SET (Cement) ×2 IMPLANT
COVER SURGICAL LIGHT HANDLE (MISCELLANEOUS) ×1 IMPLANT
CUFF TRNQT CYL 34X4.125X (TOURNIQUET CUFF) ×1 IMPLANT
DERMABOND ADVANCED .7 DNX12 (GAUZE/BANDAGES/DRESSINGS) ×1 IMPLANT
DRAPE U-SHAPE 47X51 STRL (DRAPES) ×1 IMPLANT
DRSG AQUACEL AG ADV 3.5X10 (GAUZE/BANDAGES/DRESSINGS) ×1 IMPLANT
DURAPREP 26ML APPLICATOR (WOUND CARE) ×1 IMPLANT
ELECT REM PT RETURN 15FT ADLT (MISCELLANEOUS) ×1 IMPLANT
GLOVE BIO SURGEON STRL SZ 6.5 (GLOVE) IMPLANT
GLOVE BIO SURGEON STRL SZ8 (GLOVE) ×1 IMPLANT
GLOVE BIOGEL PI IND STRL 6.5 (GLOVE) IMPLANT
GLOVE BIOGEL PI IND STRL 7.0 (GLOVE) IMPLANT
GLOVE BIOGEL PI IND STRL 8 (GLOVE) ×1 IMPLANT
GOWN STRL REUS W/ TWL LRG LVL3 (GOWN DISPOSABLE) ×1 IMPLANT
HOLDER FOLEY CATH W/STRAP (MISCELLANEOUS) IMPLANT
IMMOBILIZER KNEE 20 (SOFTGOODS) ×1
IMMOBILIZER KNEE 20 THIGH 36 (SOFTGOODS) ×1 IMPLANT
INSERT TIB ATTUNE RP SZ5X16 (Insert) IMPLANT
KIT TURNOVER KIT A (KITS) IMPLANT
MANIFOLD NEPTUNE II (INSTRUMENTS) ×1 IMPLANT
NS IRRIG 1000ML POUR BTL (IV SOLUTION) ×1 IMPLANT
PACK TOTAL KNEE CUSTOM (KITS) ×1 IMPLANT
PADDING CAST COTTON 6X4 STRL (CAST SUPPLIES) ×2 IMPLANT
PATELLA MEDIAL ATTUN 35MM KNEE (Knees) IMPLANT
PIN STEINMAN FIXATION KNEE (PIN) IMPLANT
PROTECTOR NERVE ULNAR (MISCELLANEOUS) ×1 IMPLANT
SET HNDPC FAN SPRY TIP SCT (DISPOSABLE) ×1 IMPLANT
SUT MNCRL AB 4-0 PS2 18 (SUTURE) ×1 IMPLANT
SUT STRATAFIX 0 PDS 27 VIOLET (SUTURE) ×1
SUT VIC AB 2-0 CT1 TAPERPNT 27 (SUTURE) ×3 IMPLANT
SUTURE STRATFX 0 PDS 27 VIOLET (SUTURE) ×1 IMPLANT
TRAY FOLEY MTR SLVR 16FR STAT (SET/KITS/TRAYS/PACK) IMPLANT
TUBE SUCTION HIGH CAP CLEAR NV (SUCTIONS) ×1 IMPLANT
WATER STERILE IRR 1000ML POUR (IV SOLUTION) ×2 IMPLANT
WRAP KNEE MAXI GEL POST OP (GAUZE/BANDAGES/DRESSINGS) ×1 IMPLANT

## 2023-03-18 NOTE — Interval H&P Note (Signed)
 History and Physical Interval Note:  03/18/2023 8:43 AM  Katelyn Peterson  has presented today for surgery, with the diagnosis of right knee osteoarthritis.  The various methods of treatment have been discussed with the patient and family. After consideration of risks, benefits and other options for treatment, the patient has consented to  Procedure(s): TOTAL KNEE ARTHROPLASTY (Right) as a surgical intervention.  The patient's history has been reviewed, patient examined, no change in status, stable for surgery.  I have reviewed the patient's chart and labs.  Questions were answered to the patient's satisfaction.     [name]

## 2023-03-18 NOTE — Anesthesia Procedure Notes (Signed)
 Spinal  Patient location during procedure: OR End time: 03/18/2023 12:09 PM Reason for block: surgical anesthesia Staffing Performed: resident/CRNA  Resident/CRNA: Dwana Gong D, CRNA Performed by: Tallulah Hosman, Clinical Cytogeneticist D, CRNA Authorized by: Erma Thom SAUNDERS, MD   Preanesthetic Checklist Completed: patient identified, IV checked, site marked, risks and benefits discussed, surgical consent, monitors and equipment checked, pre-op evaluation and timeout performed Spinal Block Patient position: sitting Prep: DuraPrep Patient monitoring: heart rate, continuous pulse ox and blood pressure Approach: right paramedian Location: L3-4 Injection technique: single-shot Needle Needle type: Pencan  Needle gauge: 24 G Needle length: 9 cm Assessment Sensory level: T6 Events: CSF return

## 2023-03-18 NOTE — Anesthesia Postprocedure Evaluation (Signed)
 Anesthesia Post Note  Patient: Katelyn Peterson  Procedure(s) Performed: TOTAL KNEE ARTHROPLASTY (Right: Knee)     Patient location during evaluation: PACU Anesthesia Type: Spinal Level of consciousness: oriented and awake and alert Pain management: pain level controlled Vital Signs Assessment: post-procedure vital signs reviewed and stable Respiratory status: spontaneous breathing, respiratory function stable and patient connected to nasal cannula oxygen Cardiovascular status: blood pressure returned to baseline and stable Postop Assessment: no headache, no backache and no apparent nausea or vomiting Anesthetic complications: no   No notable events documented.  Last Vitals:  Vitals:   03/18/23 1525 03/18/23 1530  BP:  (!) 166/83  Pulse: (!) 56 67  Resp: 14 11  Temp:    SpO2: 94% 100%    Last Pain:  Vitals:   03/18/23 1515  TempSrc:   PainSc: 0-No pain                 Thom JONELLE Peoples

## 2023-03-18 NOTE — Transfer of Care (Signed)
 Immediate Anesthesia Transfer of Care Note  Patient: Katelyn Peterson  Procedure(s) Performed: TOTAL KNEE ARTHROPLASTY (Right: Knee)  Patient Location: PACU  Anesthesia Type:Regional and Spinal  Level of Consciousness: awake, alert , and oriented  Airway & Oxygen Therapy: Patient Spontanous Breathing and Patient connected to face mask oxygen  Post-op Assessment: Report given to RN and Post -op Vital signs reviewed and stable  Post vital signs: Reviewed and stable  Last Vitals:  Vitals Value Taken Time  BP 156/91 03/18/23 1343  Temp    Pulse 63 03/18/23 1344  Resp 13 03/18/23 1344  SpO2 95 % 03/18/23 1344  Vitals shown include unfiled device data.  Last Pain:  Vitals:   03/18/23 1105  TempSrc: Oral  PainSc:       Patients Stated Pain Goal: 4 (03/18/23 0858)  Complications: No notable events documented.

## 2023-03-18 NOTE — Plan of Care (Signed)
  Problem: Education: Goal: Knowledge of General Education information will improve Description: Including pain rating scale, medication(s)/side effects and non-pharmacologic comfort measures Outcome: Progressing   Problem: Nutrition: Goal: Adequate nutrition will be maintained Outcome: Progressing   Problem: Elimination: Goal: Will not experience complications related to bowel motility Outcome: Progressing   Problem: Pain Management: Goal: General experience of comfort will improve Outcome: Progressing   Problem: Safety: Goal: Ability to remain free from injury will improve Outcome: Progressing   Problem: Education: Goal: Knowledge of the prescribed therapeutic regimen will improve Outcome: Progressing   Problem: Activity: Goal: Ability to avoid complications of mobility impairment will improve Outcome: Progressing   Problem: Pain Management: Goal: Pain level will decrease with appropriate interventions Outcome: Progressing

## 2023-03-18 NOTE — Progress Notes (Signed)
 Orthopedic Tech Progress Note Patient Details:  Katelyn Peterson 10-Sep-1945 [id number]  CPM Right Knee CPM Right Knee: On Right Knee Flexion (Degrees): 40 Right Knee Extension (Degrees): 10  Post Interventions Patient Tolerated: Well  Laymon DELENA Munroe 03/18/2023, 2:15 PM

## 2023-03-18 NOTE — Progress Notes (Signed)
 Pt stopped all of her meds (including BP meds) 5 days ago.

## 2023-03-18 NOTE — Discharge Instructions (Signed)
 Katelyn Gross, MD Total Joint Specialist [facility] [address], [address] [place], [place] [address] [phone]  TOTAL KNEE REPLACEMENT POSTOPERATIVE DIRECTIONS    Knee Rehabilitation, Guidelines Following Surgery  Results after knee surgery are often greatly improved when you follow the exercise, range of motion and muscle strengthening exercises prescribed by your doctor. Safety measures are also important to protect the knee from further injury. If any of these exercises cause you to have increased pain or swelling in your knee joint, decrease the amount until you are comfortable again and slowly increase them. If you have problems or questions, call your caregiver or physical therapist for advice.   BLOOD CLOT PREVENTION Take 81 mg Aspirin two times a day for three weeks following surgery. Then take an 81 mg Aspirin once a day for three weeks. Then discontinue Aspirin. You may resume your vitamins/supplements upon discharge from the hospital. Do not take any NSAIDs (Advil, Aleve, Ibuprofen, Meloxicam, etc.) for 3 weeks, while taking 81mg  Aspirin twice a day.   HOME CARE INSTRUCTIONS  Remove items at home which could result in a fall. This includes throw rugs or furniture in walking pathways.  ICE to the affected knee as much as tolerated. Icing helps control swelling. If the swelling is well controlled you will be more comfortable and rehab easier. Continue to use ice on the knee for pain and swelling from surgery. You may notice swelling that will progress down to the foot and ankle. This is normal after surgery. Elevate the leg when you are not up walking on it.    Continue to use the breathing machine which will help keep your temperature down. It is common for your temperature to cycle up and down following surgery, especially at night when you are not up moving around and exerting yourself. The breathing machine keeps your lungs expanded and your temperature  down. Do not place pillow under the operative knee, focus on keeping the knee straight while resting  DIET You may resume your previous home diet once you are discharged from the hospital.  DRESSING / WOUND CARE / SHOWERING Keep your bulky bandage on for 2 days. On the third post-operative day you may remove the Ace bandage and gauze. There is a waterproof adhesive bandage on your skin which will stay in place until your first follow-up appointment. Once you remove this you will not need to place another bandage You may begin showering 3 days following surgery, but do not submerge the incision under water.  ACTIVITY For the first 5 days, the key is rest and control of pain and swelling Do your home exercises twice a day starting on post-operative day 3. On the days you go to physical therapy, just do the home exercises once that day. You should rest, ice and elevate the leg for 50 minutes out of every hour. Get up and walk/stretch for 10 minutes per hour. After 5 days you can increase your activity slowly as tolerated. Walk with your walker as instructed. Use the walker until you are comfortable transitioning to a cane. Walk with the cane in the opposite hand of the operative leg. You may discontinue the cane once you are comfortable and walking steadily. Avoid periods of inactivity such as sitting longer than an hour when not asleep. This helps prevent blood clots.  You may discontinue the knee immobilizer once you are able to perform a straight leg raise while lying down. You may resume a sexual relationship in  one month or when given the OK by your doctor.  You may return to work once you are cleared by your doctor.  Do not drive a car for 6 weeks or until released by your surgeon.  Do not drive while taking narcotics.  TED HOSE STOCKINGS Wear the elastic stockings on both legs for three weeks following surgery during the day. You may remove them at night for sleeping.  WEIGHT  BEARING Weight bearing as tolerated with assist device (walker, cane, etc) as directed, use it as long as suggested by your surgeon or therapist, typically at least 4-6 weeks.  POSTOPERATIVE CONSTIPATION PROTOCOL Constipation - defined medically as fewer than three stools per week and severe constipation as less than one stool per week.  One of the most common issues patients have following surgery is constipation.  Even if you have a regular bowel pattern at home, your normal regimen is likely to be disrupted due to multiple reasons following surgery.  Combination of anesthesia, postoperative narcotics, change in appetite and fluid intake all can affect your bowels.  In order to avoid complications following surgery, here are some recommendations in order to help you during your recovery period.  Colace (docusate) - Pick up an over-the-counter form of Colace or another stool softener and take twice a day as long as you are requiring postoperative pain medications.  Take with a full glass of water daily.  If you experience loose stools or diarrhea, hold the colace until you stool forms back up. If your symptoms do not get better within 1 week or if they get worse, check with your doctor. Dulcolax (bisacodyl) - Pick up over-the-counter and take as directed by the product packaging as needed to assist with the movement of your bowels.  Take with a full glass of water.  Use this product as needed if not relieved by Colace only.  MiraLax (polyethylene glycol) - Pick up over-the-counter to have on hand. MiraLax is a solution that will increase the amount of water in your bowels to assist with bowel movements.  Take as directed and can mix with a glass of water, juice, soda, coffee, or tea. Take if you go more than two days without a movement. Do not use MiraLax more than once per day. Call your doctor if you are still constipated or irregular after using this medication for 7 days in a row.  If you continue  to have problems with postoperative constipation, please contact the office for further assistance and recommendations.  If you experience "the worst abdominal pain ever" or develop nausea or vomiting, please contact the office immediatly for further recommendations for treatment.  ITCHING If you experience itching with your medications, try taking only a single pain pill, or even half a pain pill at a time.  You can also use Benadryl over the counter for itching or also to help with sleep.   MEDICATIONS See your medication summary on the "After Visit Summary" that the nursing staff will review with you prior to discharge.  You may have some home medications which will be placed on hold until you complete the course of blood thinner medication.  It is important for you to complete the blood thinner medication as prescribed by your surgeon.  Continue your approved medications as instructed at time of discharge.  PRECAUTIONS If you experience chest pain or shortness of breath - call 911 immediately for transfer to the hospital emergency department.  If you develop a fever greater that  101 F, purulent drainage from wound, increased redness or drainage from wound, foul odor from the wound/dressing, or calf pain - CONTACT YOUR SURGEON.                                                   FOLLOW-UP APPOINTMENTS Make sure you keep all of your appointments after your operation with your surgeon and caregivers. You should call the office at the above phone number and make an appointment for approximately two weeks after the date of your surgery or on the date instructed by your surgeon outlined in the "After Visit Summary".  RANGE OF MOTION AND STRENGTHENING EXERCISES  Rehabilitation of the knee is important following a knee injury or an operation. After just a few days of immobilization, the muscles of the thigh which control the knee become weakened and shrink (atrophy). Knee exercises are designed to build up  the tone and strength of the thigh muscles and to improve knee motion. Often times heat used for twenty to thirty minutes before working out will loosen up your tissues and help with improving the range of motion but do not use heat for the first two weeks following surgery. These exercises can be done on a training (exercise) mat, on the floor, on a table or on a bed. Use what ever works the best and is most comfortable for you Knee exercises include:  Leg Lifts - While your knee is still immobilized in a splint or cast, you can do straight leg raises. Lift the leg to 60 degrees, hold for 3 sec, and slowly lower the leg. Repeat 10-20 times 2-3 times daily. Perform this exercise against resistance later as your knee gets better.  Quad and Hamstring Sets - Tighten up the muscle on the front of the thigh (Quad) and hold for 5-10 sec. Repeat this 10-20 times hourly. Hamstring sets are done by pushing the foot backward against an object and holding for 5-10 sec. Repeat as with quad sets.  Leg Slides: Lying on your back, slowly slide your foot toward your buttocks, bending your knee up off the floor (only go as far as is comfortable). Then slowly slide your foot back down until your leg is flat on the floor again. Angel Wings: Lying on your back spread your legs to the side as far apart as you can without causing discomfort.  A rehabilitation program following serious knee injuries can speed recovery and prevent re-injury in the future due to weakened muscles. Contact your doctor or a physical therapist for more information on knee rehabilitation.   POST-OPERATIVE OPIOID TAPER INSTRUCTIONS: It is important to wean off of your opioid medication as soon as possible. If you do not need pain medication after your surgery it is ok to stop day one. Opioids include: Codeine, Hydrocodone(Norco, Vicodin), Oxycodone(Percocet, oxycontin) and hydromorphone amongst others.  Long term and even short term use of opiods can  cause: Increased pain response Dependence Constipation Depression Respiratory depression And more.  Withdrawal symptoms can include Flu like symptoms Nausea, vomiting And more Techniques to manage these symptoms Hydrate well Eat regular healthy meals Stay active Use relaxation techniques(deep breathing, meditating, yoga) Do Not substitute Alcohol to help with tapering If you have been on opioids for less than two weeks and do not have pain than it is ok to stop all together.  Plan to wean off of opioids This plan should start within one week post op of your joint replacement. Maintain the same interval or time between taking each dose and first decrease the dose.  Cut the total daily intake of opioids by one tablet each day Next start to increase the time between doses. The last dose that should be eliminated is the evening dose.   IF YOU ARE TRANSFERRED TO A SKILLED REHAB FACILITY If the patient is transferred to a skilled rehab facility following release from the hospital, a list of the current medications will be sent to the facility for the patient to continue.  When discharged from the skilled rehab facility, please have the facility set up the patient's Home Health Physical Therapy prior to being released. Also, the skilled facility will be responsible for providing the patient with their medications at time of release from the facility to include their pain medication, the muscle relaxants, and their blood thinner medication. If the patient is still at the rehab facility at time of the two week follow up appointment, the skilled rehab facility will also need to assist the patient in arranging follow up appointment in our office and any transportation needs.  MAKE SURE YOU:  Understand these instructions.  Get help right away if you are not doing well or get worse.   DENTAL ANTIBIOTICS:  In most cases prophylactic antibiotics for Dental procdeures after total joint surgery are  not necessary.  Exceptions are as follows:  1. History of prior total joint infection  2. Severely immunocompromised (Organ Transplant, cancer chemotherapy, Rheumatoid biologic meds such as Humera)  3. Poorly controlled diabetes (A1C &gt; 8.0, blood glucose over 200)  If you have one of these conditions, contact your surgeon for an antibiotic prescription, prior to your dental procedure.    Pick up stool softner and laxative for home use following surgery while on pain medications. Do not submerge incision under water. Please use good hand washing techniques while changing dressing each day. May shower starting three days after surgery. Please use a clean towel to pat the incision dry following showers. Continue to use ice for pain and swelling after surgery. Do not use any lotions or creams on the incision until instructed by your surgeon.

## 2023-03-18 NOTE — Anesthesia Preprocedure Evaluation (Signed)
 Anesthesia Evaluation  Patient identified by MRN, date of birth, ID band Patient awake    Reviewed: Allergy & Precautions, H&P , NPO status , Patient's Chart, lab work & pertinent test results  Airway Mallampati: II  TM Distance: >3 FB Neck ROM: Full    Dental no notable dental hx.    Pulmonary neg pulmonary ROS   Pulmonary exam normal breath sounds clear to auscultation       Cardiovascular hypertension, negative cardio ROS Normal cardiovascular exam Rhythm:Regular Rate:Normal     Neuro/Psych negative neurological ROS  negative psych ROS   GI/Hepatic negative GI ROS, Neg liver ROS,,,  Endo/Other  negative endocrine ROSdiabetes, Type 2    Renal/GU CRFRenal disease  negative genitourinary   Musculoskeletal  (+) Arthritis ,    Abdominal   Peds negative pediatric ROS (+)  Hematology  (+) Blood dyscrasia, anemia   Anesthesia Other Findings   Reproductive/Obstetrics negative OB ROS                              Anesthesia Physical Anesthesia Plan  ASA: 3  Anesthesia Plan: Spinal   Post-op Pain Management: Regional block*   Induction:   PONV Risk Score and Plan: 3 and Treatment may vary due to age or medical condition and Ondansetron   Airway Management Planned: Natural Airway  Additional Equipment:   Intra-op Plan:   Post-operative Plan:   Informed Consent: I have reviewed the patients History and Physical, chart, labs and discussed the procedure including the risks, benefits and alternatives for the proposed anesthesia with the patient or authorized representative who has indicated his/her understanding and acceptance.     Dental advisory given  Plan Discussed with: CRNA  Anesthesia Plan Comments:          Anesthesia Quick Evaluation

## 2023-03-18 NOTE — Progress Notes (Signed)
 Orthopedic Tech Progress Note Patient Details:  Katelyn Peterson December 29, 1945 [id number]  Patient ID: [name], female   DOB: 14-Aug-1945, 78 y.o.   MRN: [id number] CPM removed by PT. Darleen Crocker 03/18/2023, 5:46 PM

## 2023-03-18 NOTE — Evaluation (Signed)
 Physical Therapy Evaluation Patient Details Name: Katelyn Peterson MRN: [id number] DOB: 02-18-1946 Today's Date: 03/18/2023  History of Present Illness  78 yo female s/p R TKA on 03/18/23. PMH HTN, DM, anemia  Clinical Impression  Pt is s/p TKA resulting in the deficits listed below (see PT Problem List).  Pt able to stand and perform step pivot transfer bed to chair. No knee buckling however pt with steppage gait d/t decr sensation bil feet d/t residual effects of spinal.  HEP initiated. Amb deferred for pt safety at this time. Anticipate steady progress in acute setting.   Pt will benefit from acute skilled PT to increase their independence and safety with mobility to allow discharge.          If plan is discharge home, recommend the following: A little help with walking and/or transfers;A little help with bathing/dressing/bathroom;Assist for transportation;Help with stairs or ramp for entrance;Assistance with cooking/housework   Can travel by private [employer] (2 wheels)  Recommendations for Other Services       Functional Status Assessment Patient has had a recent decline in their functional status and demonstrates the ability to make significant improvements in function in a reasonable and predictable amount of time.     Precautions / Restrictions Precautions Precautions: Fall;Knee Required Braces or Orthoses: Knee Immobilizer - Right Knee Immobilizer - Right: Discontinue once straight leg raise with < 10 degree lag Restrictions Weight Bearing Restrictions Per Provider Order: No Other Position/Activity Restrictions: WBAT      Mobility  Bed Mobility Overal bed mobility: Needs Assistance Bed Mobility: Supine to Sit     Supine to sit: Min assist     General bed mobility comments: with LLE    Transfers Overall transfer level: Needs assistance Equipment used: Rolling walker (2 wheels) Transfers: Sit to/from Stand, Bed  to chair/wheelchair/BSC Sit to Stand: Min assist   Step pivot transfers: Min assist       General transfer comment: cues for hand placement, assist to rise and transition to RW; cues for use of UEs to offload RLE as needed for safety. no knee buckling; steppage type gait d/t residual effects of spinal    Ambulation/Gait               General Gait Details: deferred  Stairs            Wheelchair Mobility     Tilt Bed    Modified Rankin (Stroke Patients Only)       Balance Overall balance assessment: Needs assistance         Standing balance support: During functional activity, Reliant on assistive device for balance Standing balance-Leahy Scale: Poor                               Pertinent Vitals/Pain Pain Assessment Pain Assessment: 0-10 Pain Score: 0-No pain Pain Location: right knee Pain Intervention(s): Other (comment) (nerve block)    Home Living Family/patient expects to be discharged to:: Private residence Living Arrangements: Spouse/significant other;Other relatives Available Help at Discharge: Family Type of Home: House Home Access: Stairs to enter   [employer] of Steps: 1   Home Layout: One level Home Equipment: None      Prior Function Prior Level of Function : Independent/Modified Independent;Working/employed;Driving             Mobility Comments: independent, 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, sport and exercise  Extremity/Trunk Assessment   Upper Extremity Assessment Upper Extremity Assessment: Overall WFL for tasks assessed    Lower Extremity Assessment Lower Extremity Assessment: RLE deficits/detail RLE Deficits / Details: residual numbness bil feet-plantar surface, able to SLR without quad lag       Communication   Communication Communication: No apparent difficulties  Cognition Arousal: Alert Behavior During Therapy: WFL for tasks assessed/performed Overall Cognitive Status: Within Functional Limits  for tasks assessed                                          General Comments      Exercises Total Joint Exercises Ankle Circles/Pumps: AROM, Both, 5 reps, Limitations Ankle Circles/Pumps Limitations: numbness bil, ltd AROM Quad Sets: AROM, Both, 5 reps Long Arc Quad: AROM, Both, 5 reps, Seated   Assessment/Plan    PT Assessment Patient needs continued PT services  PT Problem List Decreased strength;Decreased range of motion;Decreased activity tolerance;Decreased mobility;Decreased knowledge of precautions;Decreased knowledge of use of DME;Pain       PT Treatment Interventions DME instruction;Gait training;Functional mobility training;Therapeutic activities;Therapeutic exercise;Patient/family education    PT Goals (Current goals can be found in the Care Plan section)  Acute Rehab PT Goals Patient Stated Goal: back to work, less pain PT Goal Formulation: With patient Time For Goal Achievement: 03/25/23 Potential to Achieve Goals: Good    Frequency 7X/week     Co-evaluation               AM-PAC PT 6 Clicks Mobility  Outcome Measure Help needed turning from your back to your side while in a flat bed without using bedrails?: A Little Help needed moving from lying on your back to sitting on the side of a flat bed without using bedrails?: A Little Help needed moving to and from a bed to a chair (including a wheelchair)?: A Little Help needed standing up from a chair using your arms (e.g., wheelchair or bedside chair)?: A Little Help needed to walk in hospital room?: A Lot Help needed climbing 3-5 steps with a railing? : Total 6 Click Score: 15    End of Session Equipment Utilized During Treatment: Gait belt Activity Tolerance: Patient tolerated treatment well Patient left: with call bell/phone within reach;in chair;with chair alarm set;with family/visitor present   PT Visit Diagnosis: Other abnormalities of gait and mobility (R26.89);Difficulty in  walking, not elsewhere classified (R26.2)    Time: 8346-8285 PT Time Calculation (min) (ACUTE ONLY): 21 min   Charges:   PT Evaluation $PT Eval Low Complexity: 1 Low   PT General Charges $$ ACUTE PT VISIT: 1 Visit         Shogo Larkey, PT  Acute Rehab Dept [facility]) [phone]  03/18/2023   [facility] 03/18/2023, 5:21 PM

## 2023-03-18 NOTE — Op Note (Signed)
 OPERATIVE REPORT-TOTAL KNEE ARTHROPLASTY   Pre-operative diagnosis- Osteoarthritis  Right knee(s)  Post-operative diagnosis- Osteoarthritis Right knee(s)  Procedure-  Right  Total Knee Arthroplasty  Surgeon- Dempsey GAILS. Jermaine Neuharth, MD  Assistant- Corean Sender, PA-C   Anesthesia-   Adductor canal block and spinal  EBL- 25 ml   Drains None  Tourniquet time-  Total Tourniquet Time Documented: Thigh (Right) - 38 minutes Total: Thigh (Right) - 38 minutes     Complications- None  Condition-PACU - hemodynamically stable.   Brief Clinical Note  Katelyn Peterson is a 78 y.o. year old female with end stage OA of her right knee with progressively worsening pain and dysfunction. She has constant pain, with activity and at rest and significant functional deficits with difficulties even with ADLs. She has had extensive non-op management including analgesics, injections of cortisone and viscosupplements, and home exercise program, but remains in significant pain with significant dysfunction.Radiographs show bone on bone arthritis all 3 compartments. She presents now for right Total Knee Arthroplasty.     Procedure in detail---   The patient is brought into the operating room and positioned supine on the operating table. After successful administration of  Adductor canal block and spinal,   a tourniquet is placed high on the  Right thigh(s) and the lower extremity is prepped and draped in the usual sterile fashion. Time out is performed by the operating team and then the  Right lower extremity is wrapped in Esmarch, knee flexed and the tourniquet inflated to 300 mmHg.       A midline incision is made with a ten blade through the subcutaneous tissue to the level of the extensor mechanism. A fresh blade is used to make a medial parapatellar arthrotomy. Soft tissue over the proximal medial tibia is subperiosteally elevated to the joint line with a knife and into the semimembranosus bursa with a  Cobb elevator. Soft tissue over the proximal lateral tibia is elevated with attention being paid to avoiding the patellar tendon on the tibial tubercle. The patella is everted, knee flexed 90 degrees and the ACL and PCL are removed. Findings are bone on bone all 3 compartments with massive global osteophytes and multiple large calcified loose bodies        The drill is used to create a starting hole in the distal femur and the canal is thoroughly irrigated with sterile saline to remove the fatty contents. The 5 degree Right  valgus alignment guide is placed into the femoral canal and the distal femoral cutting block is pinned to remove 10 mm off the distal femur. Resection is made with an oscillating saw.      The tibia is subluxed forward and the menisci are removed. The extramedullary alignment guide is placed referencing proximally at the medial aspect of the tibial tubercle and distally along the second metatarsal axis and tibial crest. The block is pinned to remove 2mm off the more deficient medial  side. Resection is made with an oscillating saw. Size 4is the most appropriate size for the tibia and the proximal tibia is prepared with the modular drill and keel punch for that size.      The femoral sizing guide is placed and size 5 is most appropriate. Rotation is marked off the epicondylar axis and confirmed by creating a rectangular flexion gap at 90 degrees. The size 5 cutting block is pinned in this rotation and the anterior, posterior and chamfer cuts are made with the oscillating saw. The intercondylar block is  then placed and that cut is made.      Trial size 4 tibial component, trial size 5 posterior stabilized femur and a 16  mm posterior stabilized rotating platform insert trial is placed. Full extension is achieved with excellent varus/valgus and anterior/posterior balance throughout full range of motion. The patella is everted and thickness measured to be 24  mm. Free hand resection is taken to  14 mm, a 35 template is placed, lug holes are drilled, trial patella is placed, and it tracks normally. Osteophytes are removed off the posterior femur with the trial in place. All trials are removed and the cut bone surfaces prepared with pulsatile lavage. Cement is mixed and once ready for implantation, the size 4 tibial implant, size  5 posterior stabilized femoral component, and the size 35 patella are cemented in place and the patella is held with the clamp. The trial insert is placed and the knee held in full extension. The Exparel  (20 ml mixed with 60 ml saline) is injected into the extensor mechanism, posterior capsule, medial and lateral gutters and subcutaneous tissues.  All extruded cement is removed and once the cement is hard the permanent 16 mm posterior stabilized rotating platform insert is placed into the tibial tray.      The wound is copiously irrigated with saline solution and the extensor mechanism closed with # 0 Stratofix suture. The tourniquet is released for a total tourniquet time of 38  minutes. Flexion against gravity is 140 degrees and the patella tracks normally. Subcutaneous tissue is closed with 2.0 vicryl and subcuticular with running 4.0 Monocryl. The incision is cleaned and dried and steri-strips and a bulky sterile dressing are applied. The limb is placed into a knee immobilizer and the patient is awakened and transported to recovery in stable condition.      Please note that a surgical assistant was a medical necessity for this procedure in order to perform it in a safe and expeditious manner. Surgical assistant was necessary to retract the ligaments and vital neurovascular structures to prevent injury to them and also necessary for proper positioning of the limb to allow for anatomic placement of the prosthesis.   Dempsey ROCKFORD Nyan Dufresne, MD    03/18/2023, 1:19 PM

## 2023-03-18 NOTE — Anesthesia Procedure Notes (Signed)
 Anesthesia Regional Block: Adductor canal block   Pre-Anesthetic Checklist: , timeout performed,  Correct Patient, Correct Site, Correct Laterality,  Correct Procedure, Correct Position, site marked,  Risks and benefits discussed,  Surgical consent,  Pre-op evaluation,  At surgeon's request and post-op pain management  Laterality: Right  Prep: chloraprep       Needles:  Injection technique: Single-shot  Needle Type: Echogenic Stimulator Needle     Needle Length: 9cm  Needle Gauge: 21     Additional Needles:   Procedures:,,,, ultrasound used (permanent image in chart),,    Narrative:  Start time: 03/18/2023 11:06 AM End time: 03/18/2023 11:10 AM Injection made incrementally with aspirations every 5 mL.  Performed by: Personally  Anesthesiologist: Erma Thom SAUNDERS, MD  Additional Notes: Discussed risks and benefits of the nerve block in detail, including but not limited vascular injury, permanent nerve damage and infection.   Patient tolerated the procedure well. Local anesthetic introduced in an incremental fashion under minimal resistance after negative aspirations. No paresthesias were elicited. After completion of the procedure, no acute issues were identified and patient continued to be monitored by RN.

## 2023-03-19 ENCOUNTER — Other Ambulatory Visit (HOSPITAL_BASED_OUTPATIENT_CLINIC_OR_DEPARTMENT_OTHER): Payer: Self-pay

## 2023-03-19 ENCOUNTER — Other Ambulatory Visit (HOSPITAL_COMMUNITY): Payer: Self-pay

## 2023-03-19 ENCOUNTER — Encounter (HOSPITAL_COMMUNITY): Payer: Self-pay | Admitting: Orthopedic Surgery

## 2023-03-19 DIAGNOSIS — M1711 Unilateral primary osteoarthritis, right knee: Secondary | ICD-10-CM | POA: Diagnosis not present

## 2023-03-19 DIAGNOSIS — Z7982 Long term (current) use of aspirin: Secondary | ICD-10-CM | POA: Diagnosis not present

## 2023-03-19 DIAGNOSIS — Z96651 Presence of right artificial knee joint: Secondary | ICD-10-CM | POA: Diagnosis not present

## 2023-03-19 DIAGNOSIS — Z7984 Long term (current) use of oral hypoglycemic drugs: Secondary | ICD-10-CM | POA: Diagnosis not present

## 2023-03-19 DIAGNOSIS — E119 Type 2 diabetes mellitus without complications: Secondary | ICD-10-CM | POA: Diagnosis not present

## 2023-03-19 DIAGNOSIS — Z79899 Other long term (current) drug therapy: Secondary | ICD-10-CM | POA: Diagnosis not present

## 2023-03-19 DIAGNOSIS — I10 Essential (primary) hypertension: Secondary | ICD-10-CM | POA: Diagnosis not present

## 2023-03-19 LAB — GLUCOSE, CAPILLARY
Glucose-Capillary: 133 mg/dL — ABNORMAL HIGH (ref 70–99)
Glucose-Capillary: 135 mg/dL — ABNORMAL HIGH (ref 70–99)

## 2023-03-19 LAB — BASIC METABOLIC PANEL
Anion gap: 7 (ref 5–15)
BUN: 25 mg/dL — ABNORMAL HIGH (ref 8–23)
CO2: 21 mmol/L — ABNORMAL LOW (ref 22–32)
Calcium: 8.5 mg/dL — ABNORMAL LOW (ref 8.9–10.3)
Chloride: 109 mmol/L (ref 98–111)
Creatinine, Ser: 1.16 mg/dL — ABNORMAL HIGH (ref 0.44–1.00)
GFR, Estimated: 49 mL/min — ABNORMAL LOW (ref >60)
Glucose, Bld: 176 mg/dL — ABNORMAL HIGH (ref 70–99)
Potassium: 4.9 mmol/L (ref 3.5–5.1)
Sodium: 137 mmol/L (ref 135–145)

## 2023-03-19 LAB — CBC
HCT: 27.9 % — ABNORMAL LOW (ref 36.0–46.0)
Hemoglobin: 9.1 g/dL — ABNORMAL LOW (ref 12.0–15.0)
MCH: 31.2 pg (ref 26.0–34.0)
MCHC: 32.6 g/dL (ref 30.0–36.0)
MCV: 95.5 fL (ref 80.0–100.0)
Platelets: 209 10*3/uL (ref 150–400)
RBC: 2.92 MIL/uL — ABNORMAL LOW (ref 3.87–5.11)
RDW: 13.1 % (ref 11.5–15.5)
WBC: 9.6 10*3/uL (ref 4.0–10.5)
nRBC: 0 % (ref 0.0–0.2)

## 2023-03-19 MED ORDER — OXYCODONE HCL 5 MG PO TABS
5.0000 mg | ORAL_TABLET | Freq: Four times a day (QID) | ORAL | 0 refills | Status: DC | PRN
  Filled 2023-03-19: qty 42, 6d supply, fill #0

## 2023-03-19 MED ORDER — TRAMADOL HCL 50 MG PO TABS
50.0000 mg | ORAL_TABLET | Freq: Four times a day (QID) | ORAL | Status: DC | PRN
  Administered 2023-03-19: 100 mg via ORAL
  Filled 2023-03-19: qty 2

## 2023-03-19 MED ORDER — METHOCARBAMOL 500 MG PO TABS
500.0000 mg | ORAL_TABLET | Freq: Four times a day (QID) | ORAL | 0 refills | Status: DC | PRN
  Filled 2023-03-19: qty 40, 10d supply, fill #0

## 2023-03-19 MED ORDER — ONDANSETRON HCL 4 MG PO TABS
4.0000 mg | ORAL_TABLET | Freq: Four times a day (QID) | ORAL | 0 refills | Status: DC | PRN
  Filled 2023-03-19: qty 20, 5d supply, fill #0

## 2023-03-19 MED ORDER — ASPIRIN 81 MG PO CHEW
81.0000 mg | CHEWABLE_TABLET | Freq: Two times a day (BID) | ORAL | 0 refills | Status: AC
  Filled 2023-03-19: qty 90, 45d supply, fill #0

## 2023-03-19 NOTE — Progress Notes (Signed)
 Subjective: 1 Day Post-Op Procedure(s) (LRB): TOTAL KNEE ARTHROPLASTY (Right) Patient reports pain as mild.   Patient seen in rounds by Dr. Melodi. Patient is well, and has had no acute complaints or problems No issues overnight. Denies chest pain, SOB, or calf pain. Foley catheter removed this AM.  We will continue therapy today  Objective: Vital signs in last 24 hours: Temp:  [97.5 F (36.4 C)-99 F (37.2 C)] 99 F (37.2 C) (01/07 0609) Pulse Rate:  [56-77] 61 (01/07 0609) Resp:  [11-23] 17 (01/07 0609) BP: (130-177)/(61-91) 134/67 (01/07 0609) SpO2:  [93 %-100 %] 100 % (01/07 0609)  Intake/Output from previous day:  Intake/Output Summary (Last 24 hours) at 03/19/2023 0827 Last data filed at 03/19/2023 0600 Gross per 24 hour  Intake 2817 ml  Output 1800 ml  Net 1017 ml     Intake/Output this shift: No intake/output data recorded.  Labs: Recent Labs    03/19/23 0323  HGB 9.1*   Recent Labs    03/19/23 0323  WBC 9.6  RBC 2.92*  HCT 27.9*  PLT 209   Recent Labs    03/19/23 0323  NA 137  K 4.9  CL 109  CO2 21*  BUN 25*  CREATININE 1.16*  GLUCOSE 176*  CALCIUM  8.5*   No results for input(s): LABPT, INR in the last 72 hours.  Exam: General - Patient is Alert and Oriented Extremity - Neurologically intact Neurovascular intact Sensation intact distally Dorsiflexion/Plantar flexion intact Dressing - dressing C/D/I Motor Function - intact, moving foot and toes well on exam.   Past Medical History:  Diagnosis Date   Anemia    Arthritis    HTN (hypertension)    Hyperlipidemia    Obesity    Type II or unspecified type diabetes mellitus without mention of complication, not stated as uncontrolled     Assessment/Plan: 1 Day Post-Op Procedure(s) (LRB): TOTAL KNEE ARTHROPLASTY (Right) Principal Problem:   OA (osteoarthritis) of knee Active Problems:   Primary osteoarthritis of right knee  Estimated body mass index is 32.79 kg/m as  calculated from the following:   Height as of 03/12/23: 5' 4.5 (1.638 m).   Weight as of 03/12/23: 88 kg. Advance diet Up with therapy D/C IV fluids   Patient's anticipated LOS is less than 2 midnights, meeting these requirements: - Lives within 1 hour of care - Has a competent adult at home to recover with post-op recover - NO history of             - Chronic pain requiring opiods             - Coronary Artery Disease             - Heart failure             - Heart attack             - Stroke             - DVT/VTE             - Cardiac arrhythmia             - Respiratory Failure/COPD             - Renal failure             - Advanced Liver disease  DVT Prophylaxis - Aspirin  Weight bearing as tolerated. Continue therapy.  Plan is to go Home after hospital stay. Plan for discharge later today if  progresses with therapy and meeting goals. Scheduled for OPPT at [facility] Follow-up in the office in 2 weeks.  The PDMP database was reviewed today prior to any opioid medications being prescribed to this patient.  Roxie Mess, PA-C Orthopedic Surgery [phone] 03/19/2023, 8:27 AM

## 2023-03-19 NOTE — Progress Notes (Signed)
 Physical Therapy Treatment Patient Details Name: Katelyn Peterson MRN: [id number] DOB: Oct 03, 1945 Today's Date: 03/19/2023   History of Present Illness 78 yo female s/p R TKA on 03/18/23. PMH HTN, DM, anemia    PT Comments  Pt progressing well this session, having pain with mobility but willing to work within limits. Pt is motivated to d/c today after second PT session   If plan is discharge home, recommend the following: A little help with walking and/or transfers;A little help with bathing/dressing/bathroom;Assist for transportation;Help with stairs or ramp for entrance;Assistance with cooking/housework   Can travel by [employer] (2 wheels)    Recommendations for Other Services       Precautions / Restrictions Precautions Precautions: Fall;Knee Restrictions Weight Bearing Restrictions Per Provider Order: No Other Position/Activity Restrictions: WBAT     Mobility  Bed Mobility Overal bed mobility: Needs Assistance Bed Mobility: Supine to Sit     Supine to sit: Min assist     General bed mobility comments: light assist to progress RLE off bed    Transfers Overall transfer level: Needs assistance Equipment used: Rolling walker (2 wheels) Transfers: Sit to/from Stand Sit to Stand: Contact guard assist, Min assist           General transfer comment: cues for hand placement and RLE position    Ambulation/Gait Ambulation/Gait assistance: Contact guard assist Gait Distance (Feet): 30 Feet Assistive device: Rolling walker (2 wheels) Gait Pattern/deviations: Step-to pattern, Decreased stance time - right, Decreased weight shift to right       General Gait Details: cues for sequence and RW position as well as use of UEs to off load RLE for improved pain control   Stairs             Wheelchair Mobility     Tilt Bed    Modified Rankin (Stroke Patients Only)       Balance                                             Cognition Arousal: Alert Behavior During Therapy: WFL for tasks assessed/performed Overall Cognitive Status: Within Functional Limits for tasks assessed                                          Exercises Total Joint Exercises Ankle Circles/Pumps: AROM, Both, 10 reps Quad Sets: AROM, Both, 5 reps Heel Slides: AAROM, Right, 5 reps Straight Leg Raises: AROM, AAROM, Right, 5 reps    General Comments        Pertinent Vitals/Pain Pain Assessment Pain Assessment: 0-10 Pain Score: 7  Pain Location: right knee Pain Descriptors / Indicators: Aching, Grimacing, Guarding, Sore Pain Intervention(s): Limited activity within patient's tolerance, Monitored during session, Premedicated before session, Repositioned, Ice applied    Home Living                          Prior Function            PT Goals (current goals can now be found in the care plan section) Acute Rehab PT Goals Patient Stated Goal: back to work, less pain PT Goal Formulation: With patient Time For Goal Achievement:  03/25/23 Potential to Achieve Goals: Good Progress towards PT goals: Progressing toward goals    Frequency    7X/week      PT Plan      Co-evaluation              AM-PAC PT 6 Clicks Mobility   Outcome Measure  Help needed turning from your back to your side while in a flat bed without using bedrails?: A Little Help needed moving from lying on your back to sitting on the side of a flat bed without using bedrails?: A Little Help needed moving to and from a bed to a chair (including a wheelchair)?: A Little Help needed standing up from a chair using your arms (e.g., wheelchair or bedside chair)?: A Little Help needed to walk in hospital room?: A Little Help needed climbing 3-5 steps with a railing? : A Little 6 Click Score: 18    End of Session Equipment Utilized During Treatment: Gait belt Activity Tolerance: Patient  tolerated treatment well Patient left: in chair;with call bell/phone within reach;with chair alarm set   PT Visit Diagnosis: Other abnormalities of gait and mobility (R26.89);Difficulty in walking, not elsewhere classified (R26.2)     Time: 8947-8882 PT Time Calculation (min) (ACUTE ONLY): 25 min  Charges:    $Gait Training: 8-22 mins $Therapeutic Exercise: 8-22 mins PT General Charges $$ ACUTE PT VISIT: 1 Visit                     Marcella Dunnaway, PT  Acute Rehab Dept [facility]) [phone]  03/19/2023    [facility] 03/19/2023, 11:58 AM

## 2023-03-19 NOTE — TOC Transition Note (Signed)
 Transition of Care [facility]) - Discharge Note   Patient Details  Name: Katelyn Peterson MRN: [id number] Date of Birth: 1945-09-04  [facility]) CM/SW Contact:  Alfonse JONELLE Rex, RN Phone Number: 03/19/2023, 4:17 PM   Clinical Narrative:   Met with pt at bedside, confirmed has received RW to room from [facility]. Outpatient at [facility]. No further TOC needs.     Final next level of care: OP Rehab Barriers to Discharge: No Barriers Identified   Patient Goals and CMS Choice Patient states their goals for this hospitalization and ongoing recovery are:: return home          Discharge Placement                       Discharge Plan and Services Additional resources added to the After Visit Summary for                  DME Arranged: Walker rolling DME Agency: [employer] Date DME Agency Contacted: 03/19/23 Time DME Agency Contacted: [id number]              Social Drivers of Health (SDOH) Interventions SDOH Screenings   Food Insecurity: Patient Declined (03/18/2023)  Housing: Low Risk  (03/18/2023)  Transportation Needs: No Transportation Needs (03/18/2023)  Utilities: Not At Risk (03/18/2023)  Depression (PHQ2-9): Low Risk  (12/20/2022)  Social Connections: Patient Declined (03/18/2023)  Tobacco Use: Low Risk  (03/18/2023)     Readmission Risk Interventions     No data to display

## 2023-03-19 NOTE — Progress Notes (Signed)
 PT TX NOTE   03/19/23 1500  PT Visit Information  Assistance Needed Pt progressing steadily, meeting goals and is motivated to d/c home today. Reviewed areas below, pt did well with up/down single step using posterior technique.  Pt left on BSC at request of RN. Pt is ready to d/c from PT standpoint with family assisting as needed   History of Present Illness 78 yo female s/p R TKA on 03/18/23. PMH HTN, DM, anemia  Subjective Data  Patient Stated Goal back to work, less pain  Precautions  Precautions Fall;Knee  Restrictions  Weight Bearing Restrictions Per Provider Order No  Other Position/Activity Restrictions WBAT  Pain Assessment  Pain Assessment 0-10  Pain Score 6  Pain Location right knee  Pain Descriptors / Indicators Aching;Grimacing;Guarding;Sore  Pain Intervention(s) Limited activity within patient's tolerance;Monitored during session;Premedicated before session;Repositioned  Cognition  Arousal Alert  Behavior During Therapy WFL for tasks assessed/performed  Overall Cognitive Status Within Functional Limits for tasks assessed  Transfers  Overall transfer level Needs assistance  Equipment used Rolling walker (2 wheels)  Transfers Sit to/from Stand  Sit to Stand Contact guard assist;Supervision  General transfer comment STS from recliner, BSC;cues for hand placement and RLE position  Ambulation/Gait  Ambulation/Gait assistance Contact guard assist;Supervision  Gait Distance (Feet) 25 Feet  Assistive device Rolling walker (2 wheels)  Gait Pattern/deviations Step-to pattern;Decreased stance time - right;Decreased weight shift to right;Antalgic  General Gait Details cues for sequence and RW position as well as use of UEs to off load RLE for improved pain control  Gait velocity decr  Stairs Yes  Stairs assistance Min assist  Stair Management Step to pattern;Backwards;With walker  Number of Stairs 1  General stair comments cues for sequence and technique, good stability, no  knee buckling; assist to manage RW; pt reports grandson will be assisting at home  Balance  Standing balance support During functional activity;Reliant on assistive device for balance  Standing balance-Leahy Scale Poor  Total Joint Exercises  Ankle Circles/Pumps AROM;Both;5 reps  PT - End of Session  Equipment Utilized During Treatment Gait belt  Activity Tolerance Patient tolerated treatment well  Patient left Other (comment);with family/visitor present;with call bell/phone within reach [facility])  Nurse Communication Other (comment) (pt on BSC)   PT - Assessment/Plan  PT Visit Diagnosis Other abnormalities of gait and mobility (R26.89);Difficulty in walking, not elsewhere classified (R26.2)  PT Frequency (ACUTE ONLY) 7X/week  Follow Up Recommendations Follow physician's recommendations for discharge plan and follow up therapies  Patient can return home with the following A little help with walking and/or transfers;A little help with bathing/dressing/bathroom;Assist for transportation;Help with stairs or ramp for entrance;Assistance with cooking/housework  PT equipment Rolling walker (2 wheels)  AM-PAC PT 6 Clicks Mobility Outcome Measure (Version 2)  Help needed turning from your back to your side while in a flat bed without using bedrails? 3  Help needed moving from lying on your back to sitting on the side of a flat bed without using bedrails? 3  Help needed moving to and from a bed to a chair (including a wheelchair)? 3  Help needed standing up from a chair using your arms (e.g., wheelchair or bedside chair)? 3  Help needed to walk in hospital room? 3  Help needed climbing 3-5 steps with a railing?  3  6 Click Score 18  Consider Recommendation of Discharge To: Home with [facility]  PT Goal Progression  Progress towards PT goals Progressing toward goals  Acute Rehab PT Goals  PT Goal Formulation With patient  Time For Goal Achievement 03/25/23  Potential to Achieve Goals Good  PT Time  Calculation  PT Start Time (ACUTE ONLY) 1538  PT Stop Time (ACUTE ONLY) 1551  PT Time Calculation (min) (ACUTE ONLY) 13 min  PT General Charges  $$ ACUTE PT VISIT 1 Visit  PT Treatments  $Gait Training 8-22 mins

## 2023-03-21 ENCOUNTER — Ambulatory Visit: Payer: Commercial Managed Care - PPO | Attending: Student | Admitting: Physical Therapy

## 2023-03-21 ENCOUNTER — Other Ambulatory Visit: Payer: Self-pay

## 2023-03-21 ENCOUNTER — Encounter: Payer: Self-pay | Admitting: Physical Therapy

## 2023-03-21 DIAGNOSIS — M25661 Stiffness of right knee, not elsewhere classified: Secondary | ICD-10-CM | POA: Diagnosis present

## 2023-03-21 DIAGNOSIS — M25561 Pain in right knee: Secondary | ICD-10-CM | POA: Diagnosis not present

## 2023-03-21 DIAGNOSIS — R600 Localized edema: Secondary | ICD-10-CM | POA: Insufficient documentation

## 2023-03-21 DIAGNOSIS — R262 Difficulty in walking, not elsewhere classified: Secondary | ICD-10-CM | POA: Diagnosis not present

## 2023-03-21 DIAGNOSIS — G8929 Other chronic pain: Secondary | ICD-10-CM | POA: Diagnosis not present

## 2023-03-21 NOTE — Therapy (Signed)
 OUTPATIENT PHYSICAL THERAPY LOWER EXTREMITY EVALUATION   Patient Name: Katelyn Peterson MRN: [id number] DOB:01-08-1946, 78 y.o., female Today's Date: 03/21/2023  END OF SESSION:  PT End of Session - 03/21/23 1853     Visit Number 1    Date for PT Re-Evaluation 06/28/23    Authorization Type Katelyn Peterson [employer]    PT Start Time 1104    PT Stop Time 1155    PT Time Calculation (min) 51 min    Activity Tolerance Patient tolerated treatment well    Behavior During Therapy WFL for tasks assessed/performed             Past Medical History:  Diagnosis Date   Anemia    Arthritis    HTN (hypertension)    Hyperlipidemia    Obesity    Type II or unspecified type diabetes mellitus without mention of complication, not stated as uncontrolled    Past Surgical History:  Procedure Laterality Date   ABDOMINAL HYSTERECTOMY  1985   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   Procedure: TOTAL KNEE ARTHROPLASTY;  Surgeon: Melodi Lerner, MD;  Location: [facility];  Service: Orthopedics;  Laterality: Right;   Patient Active Problem List   Diagnosis Date Noted   OA (osteoarthritis) of knee 03/18/2023   Primary osteoarthritis of right knee 03/18/2023   Preop exam for internal medicine 12/20/2022   Nuclear sclerotic cataract of right eye 12/07/2022   B12 deficiency 06/20/2022   Colon polyps 06/19/2022   Arthritis of left foot 04/11/2021   CRI (chronic renal insufficiency), stage 3 (moderate) (HCC) 12/15/2020   Wrist pain, left 12/10/2018   Knee pain, right 06/19/2017   Anemia 05/08/2016   Onychomycosis 06/22/2015   Arthritis of ankle 04/01/2015   Well adult exam 08/16/2014   Goiter 11/04/2012   Left ankle pain 08/22/2011   Grief 04/20/2011   Rhinitis 04/20/2011   ABDOMINAL DISTENSION 03/09/2010   KNEE PAIN 03/04/2008   LEG CRAMPS 09/03/2007   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) 04/11/2007   Obesity 04/11/2007   Dyslipidemia associated with  type 2 diabetes mellitus (HCC) 11/26/2006   Essential hypertension 11/26/2006    PCP: Katelyn Karlynn GAILS, MD   REFERRING PROVIDER: Reena Roxie CROME, PA  REFERRING DIAG: M17.11 (ICD-10-CM) - Unilateral primary osteoarthritis, right knee  THERAPY DIAG:  Stiffness of right knee, not elsewhere classified  Chronic pain of right knee  Difficulty in walking, not elsewhere classified  Localized edema  Rationale for Evaluation and Treatment: Rehabilitation  ONSET DATE: 03/18/2023  SUBJECTIVE:   SUBJECTIVE STATEMENT: Patient presents post Rt TKA on 1/6. She has been walking once a day for 10 minutes. She was not given any exercises. She has increased pain with functional movements (sit to stand, getting in and out of a car, etc). She does not have any pain if her Rt knee if it is extended. She wants to get back to walking normally when running errands.  PERTINENT HISTORY: HTN, Anemia; Type II diabetes  PAIN:  Are you having pain? Yes: NPRS scale: 0(currently) 10(worst) Pain location: Rt leg Pain description: achy dull Aggravating factors: getting out car; sitting down with knee flexed Relieving factors: Pain medication  PRECAUTIONS: Other: Status post Rt TKA 03/18/2023  RED FLAGS: None   WEIGHT BEARING RESTRICTIONS: No  FALLS:  Has patient fallen in last 6 months? No  LIVING ENVIRONMENT: Lives with: lives with their family Lives in: House/apartment Stairs: Yes: Internal: 1 steps; none Has following equipment at  home: Katelyn Peterson - 2 wheeled and shower chair  OCCUPATION: Psychologist, [employer] And Exercise at [employer]  PLOF: Independent and Independent with basic ADLs  PATIENT GOALS: To get the pain gone  NEXT MD VISIT: 04/02/2023  OBJECTIVE:  Note: Objective measures were completed at Evaluation unless otherwise noted.   PATIENT SURVEYS:  FOTO 31; goal 62  COGNITION: Overall cognitive status: Within functional limits for tasks assessed     SENSATION: Rt knee Numbness   EDEMA:  Rt  knee 3 cm above patella: 54.5cm 3 cam below patella:51cm At patella : 51.5 cm   POSTURE: rounded shoulders and forward head   LOWER EXTREMITY ROM: seated in wheelchair  Active ROM Right eval Left eval  Hip flexion    Hip extension    Hip abduction    Hip adduction    Hip internal rotation    Hip external rotation    Knee flexion 56   Knee extension -2   Ankle dorsiflexion    Ankle plantarflexion    Ankle inversion    Ankle eversion     (Blank rows = not tested)  LOWER EXTREMITY MMT: Deferred      FUNCTIONAL TESTS:  5 times sit to stand: 24.11 with Rt knee extended & UE support  GAIT: Patient did not ambulate during treatment session due to increased pain. Patient required max assistance with car transfer                                                                                                                                 TREATMENT DATE:  03/21/2023 Initial Evaluation & HEP created Vasopneumatic  compression Rt knee low pressure x 15 minutes     PATIENT EDUCATION:  Education details: HEP; POC Person educated: Patient Education method: Explanation, Demonstration, and Handouts Education comprehension: verbalized understanding, returned demonstration, and needs further education  HOME EXERCISE PROGRAM: Access Code: [email or web address] URL: [email or web address] Date: 03/21/2023 Prepared by: Katelyn Peterson  Exercises - Seated Heel Slide  - 1 x daily - 7 x weekly - 2 sets - 10 reps - Sitting Heel Slide with Towel  - 1 x daily - 7 x weekly - 2 sets - 10 reps - Supine Quad Set  - 1 x daily - 7 x weekly - 2 sets - 10 reps - 2-3 hold - Active Straight Leg Raise with Quad Set  - 1 x daily - 7 x weekly - 2 sets - 10 reps - Seated Hamstring Stretch  - 1 x daily - 7 x weekly - 2 sets - 10 reps  ASSESSMENT:  CLINICAL IMPRESSION: Patient is a 78 y.o. female who was seen today for physical therapy evaluation and treatment for Rt knee pain. Katelyn Peterson presents  to therapy status post Rt total knee replacement on 03/18/2023. She has not been doing any exercises, but she has been trying to walk for 10 minutes once a day like her surgeon told her to. Educated patient on  the importance on working on knee flexion during the first two weeks after surgery. Patient verbalized understanding. Based on evaluation noted decreased joint mobility, edema, and decreased mobility. Patient is motivated and wants to get back to walking normally. Educated patient on the benefit of taking pain medication 30 mins - 1 hour before therapy to help pain levels. Patient will benefit from skilled PT to address the below impairments and improve overall function.   OBJECTIVE IMPAIRMENTS: Abnormal gait, decreased mobility, difficulty walking, decreased ROM, decreased strength, increased edema, increased muscle spasms, impaired flexibility, postural dysfunction, obesity, and pain.   ACTIVITY LIMITATIONS: lifting, standing, squatting, stairs, transfers, and bed mobility  PARTICIPATION LIMITATIONS: cleaning, laundry, driving, shopping, community activity, and occupation  PERSONAL FACTORS: Fitness and 1-2 comorbidities: HTN, Anemia; Type II diabetes   are also affecting patient's functional outcome.   REHAB POTENTIAL: Good  CLINICAL DECISION MAKING: Stable/uncomplicated  EVALUATION COMPLEXITY: Low   GOALS: Goals reviewed with patient? Yes  SHORT TERM GOALS: Target date: 05/02/2023  Patient will be independent with initial HEP. Baseline:  Goal status: INITIAL  2.  Patient will be able to complete a car transfer with < or = to 2/10 knee pain. Baseline:  Goal status: INITIAL  3.  Patient will demonstrate > or = to 90 degrees of knee flexion for improved sit to stand transfer. Baseline:  Goal status: INITIAL    LONG TERM GOALS: Target date: 06/28/2023 Patient will demonstrate independence in advanced HEP. Baseline:  Goal status: INITIAL  2.  Patient will be able to ascend a  curb/ step for safe community negotiation. Baseline:  Goal status: INITIAL  3.  Patient will be able to return to work and complete all work duties. Baseline:  Goal status: INITIAL  4.  Patient will score < or = to 19 sec on 5 STS for improved functional mobility. Baseline:  Goal status: INITIAL  5.  Patient will score > or = to 61 on FOTO for improved function.  Baseline:  Goal status: INITIAL  6.  Patient will be able to walk in the community and run errands with < or = to 2/10 knee pain. Baseline:  Goal status: INITIAL   PLAN:  PT FREQUENCY: 2x/week  PT DURATION: 12 weeks  PLANNED INTERVENTIONS: 97164- PT Re-evaluation, 97110-Therapeutic exercises, 97530- Therapeutic activity, 97112- Neuromuscular re-education, 97535- Self Care, 02859- Manual therapy, [phone]- Gait training, [phone]- Aquatic Therapy, 97014- Electrical stimulation (unattended), [phone]- Electrical stimulation (manual), [id number]- Vasopneumatic device, [id number]- Ultrasound, [id number]- Traction (mechanical), [id number]- Ionotophoresis 4mg /ml Dexamethasone , Patient/Family education, Balance training, Stair training, Taping, Dry Needling, Joint mobilization, Joint manipulation, Spinal manipulation, Spinal mobilization, Scar mobilization, Vestibular training, Cryotherapy, and Moist heat  PLAN FOR NEXT SESSION: assess HEP; TUG; 3 MWT; knee and hip mobility & flexibility    Katelyn Peterson, PT 03/21/23 6:54 PM [facility] [address], [address] [place], [place] [address] Phone # [phone] Fax [phone]

## 2023-03-26 ENCOUNTER — Ambulatory Visit: Payer: Commercial Managed Care - PPO | Admitting: Physical Therapy

## 2023-03-26 ENCOUNTER — Encounter: Payer: Self-pay | Admitting: Physical Therapy

## 2023-03-26 DIAGNOSIS — R262 Difficulty in walking, not elsewhere classified: Secondary | ICD-10-CM

## 2023-03-26 DIAGNOSIS — M25661 Stiffness of right knee, not elsewhere classified: Secondary | ICD-10-CM

## 2023-03-26 DIAGNOSIS — G8929 Other chronic pain: Secondary | ICD-10-CM

## 2023-03-26 DIAGNOSIS — R600 Localized edema: Secondary | ICD-10-CM

## 2023-03-26 NOTE — Therapy (Signed)
 OUTPATIENT PHYSICAL THERAPY LOWER EXTREMITY TREATMENT   Patient Name: Katelyn Peterson MRN: [id number] DOB:18-Dec-1945, 78 y.o., female Today's Date: 03/26/2023  END OF SESSION:  PT End of Session - 03/26/23 1106     Visit Number 2    Date for PT Re-Evaluation 06/28/23    Authorization Type [name]    PT Start Time 1018    PT Stop Time 1111    PT Time Calculation (min) 53 min    Activity Tolerance Patient tolerated treatment well    Behavior During Therapy WFL for tasks assessed/performed              Past Medical History:  Diagnosis Date   Anemia    Arthritis    HTN (hypertension)    Hyperlipidemia    Obesity    Type II or unspecified type diabetes mellitus without mention of complication, not stated as uncontrolled    Past Surgical History:  Procedure Laterality Date   ABDOMINAL HYSTERECTOMY  1985   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   Procedure: TOTAL KNEE ARTHROPLASTY;  Surgeon: Melodi Lerner, MD;  Location: [facility];  Service: Orthopedics;  Laterality: Right;   Patient Active Problem List   Diagnosis Date Noted   OA (osteoarthritis) of knee 03/18/2023   Primary osteoarthritis of right knee 03/18/2023   Preop exam for internal medicine 12/20/2022   Nuclear sclerotic cataract of right eye 12/07/2022   B12 deficiency 06/20/2022   Colon polyps 06/19/2022   Arthritis of left foot 04/11/2021   CRI (chronic renal insufficiency), stage 3 (moderate) (HCC) 12/15/2020   Wrist pain, left 12/10/2018   Knee pain, right 06/19/2017   Anemia 05/08/2016   Onychomycosis 06/22/2015   Arthritis of ankle 04/01/2015   Well adult exam 08/16/2014   Goiter 11/04/2012   Left ankle pain 08/22/2011   Grief 04/20/2011   Rhinitis 04/20/2011   ABDOMINAL DISTENSION 03/09/2010   KNEE PAIN 03/04/2008   LEG CRAMPS 09/03/2007   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) 04/11/2007   Obesity 04/11/2007   Dyslipidemia associated  with type 2 diabetes mellitus (HCC) 11/26/2006   Essential hypertension 11/26/2006    PCP: Garald Karlynn GAILS, MD   REFERRING PROVIDER: Reena Roxie CROME, PA  REFERRING DIAG: M17.11 (ICD-10-CM) - Unilateral primary osteoarthritis, right knee  THERAPY DIAG:  Stiffness of right knee, not elsewhere classified  Chronic pain of right knee  Difficulty in walking, not elsewhere classified  Localized edema  Rationale for Evaluation and Treatment: Rehabilitation  ONSET DATE: 03/18/2023  SUBJECTIVE:   SUBJECTIVE STATEMENT: Patient reports her knee is very stiff today. She has been doing her knee flexion exercises.   From Eval: Patient presents post Rt TKA on 1/6. She has been walking once a day for 10 minutes. She was not given any exercises. She has increased pain with functional movements (sit to stand, getting in and out of a car, etc). She does not have any pain if her Rt knee if it is extended. She wants to get back to walking normally when running errands.  PERTINENT HISTORY: HTN, Anemia; Type II diabetes  PAIN: 03/26/2023 Are you having pain? Yes: NPRS scale: 0/10 Pain location: Rt leg Pain description: achy dull Aggravating factors: getting out car; sitting down with knee flexed Relieving factors: Pain medication  PRECAUTIONS: Other: Status post Rt TKA 03/18/2023  RED FLAGS: None   WEIGHT BEARING RESTRICTIONS: No  FALLS:  Has patient fallen in last 6 months? 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   PATIENT SURVEYS:  FOTO 31; goal 21  COGNITION: Overall cognitive  status: Within functional limits for tasks assessed     SENSATION: Rt knee Numbness   EDEMA:  Rt knee 3 cm above patella: 54.5cm 3 cam below patella:51cm At patella : 51.5 cm   POSTURE: rounded shoulders and forward head   LOWER EXTREMITY ROM: seated in wheelchair  Active ROM Right eval Left eval  Hip flexion    Hip extension    Hip abduction    Hip adduction    Hip internal rotation    Hip external rotation    Knee flexion 56   Knee extension -2   Ankle dorsiflexion    Ankle plantarflexion    Ankle inversion    Ankle eversion     (Blank rows = not tested)  LOWER EXTREMITY MMT: Deferred      FUNCTIONAL TESTS:  5 times sit to stand: 24.11 with Rt knee extended & UE support 03/26/2023 TUG: 42.59 GAIT: Patient did not ambulate during treatment session due to increased pain. Patient required max assistance with car transfer                                                                                                                                 TREATMENT DATE:  03/26/2023 NuStep Level 1 5 mins- PT present to discuss status TUG:42.59 sec with front wheeled walking Rt knee flexion with slider x 20 Seated hamstring stretch 2 x 30 sec Rt  Semi-reclined Rt knee flexion with gait belt 2 x 10 Semi-reclined quad set with towel under knee x 20 Medial/ lateral weight shifting x 15; Rt LE infront shifting x 15 Manual PROM Rt knee flexion  Vasopneumatic  compression Rt knee low pressure x 8 minutes   03/21/2023 Initial Evaluation & HEP created Vasopneumatic  compression Rt knee low pressure x 15 minutes     PATIENT EDUCATION:  Education details: HEP; POC Person educated: Patient Education method: Explanation, Demonstration, and Handouts Education comprehension: verbalized understanding, returned demonstration, and needs further education  HOME EXERCISE PROGRAM: Access Code: [email or web address] URL: [email or web address] Date: 03/21/2023 Prepared by:  Kristeen Sar  Exercises - Seated Heel Slide  - 1 x daily - 7 x weekly - 2 sets - 10 reps - Sitting Heel Slide with Towel  - 1 x daily - 7 x weekly - 2 sets - 10 reps - Supine Quad Set  - 1 x daily - 7 x weekly - 2 sets - 10 reps - 2-3 hold - Active Straight Leg  Raise with Quad Set  - 1 x daily - 7 x weekly - 2 sets - 10 reps - Seated Hamstring Stretch  - 1 x daily - 7 x weekly - 2 sets - 10 reps  ASSESSMENT:  CLINICAL IMPRESSION: Today's treatment session focused on Rt knee mobility and flexibility. Laurynn ambulates and moves slowly due to increased Rt knee pain with movement. She required rest breaks after each exercises, but she was motivated and willing to work. She verbalized that getting in and out of a car was better compared to last week. She has been compliant with HEP. Patient is progessing appropriately to be 8 days post TKA. Educated patient on the importance on working on knee flexion ROM. Patient will benefit from skilled PT to address the below impairments and improve overall function.    OBJECTIVE IMPAIRMENTS: Abnormal gait, decreased mobility, difficulty walking, decreased ROM, decreased strength, increased edema, increased muscle spasms, impaired flexibility, postural dysfunction, obesity, and pain.   ACTIVITY LIMITATIONS: lifting, standing, squatting, stairs, transfers, and bed mobility  PARTICIPATION LIMITATIONS: cleaning, laundry, driving, shopping, community activity, and occupation  PERSONAL FACTORS: Fitness and 1-2 comorbidities: HTN, Anemia; Type II diabetes   are also affecting patient's functional outcome.   REHAB POTENTIAL: Good  CLINICAL DECISION MAKING: Stable/uncomplicated  EVALUATION COMPLEXITY: Low   GOALS: Goals reviewed with patient? Yes  SHORT TERM GOALS: Target date: 05/02/2023  Patient will be independent with initial HEP. Baseline:  Goal status: INITIAL  2.  Patient will be able to complete a car transfer with < or = to 2/10 knee  pain. Baseline:  Goal status: INITIAL  3.  Patient will demonstrate > or = to 90 degrees of knee flexion for improved sit to stand transfer. Baseline:  Goal status: INITIAL    LONG TERM GOALS: Target date: 06/28/2023 Patient will demonstrate independence in advanced HEP. Baseline:  Goal status: INITIAL  2.  Patient will be able to ascend a curb/ step for safe community negotiation. Baseline:  Goal status: INITIAL  3.  Patient will be able to return to work and complete all work duties. Baseline:  Goal status: INITIAL  4.  Patient will score < or = to 19 sec on 5 STS for improved functional mobility. Baseline:  Goal status: INITIAL  5.  Patient will score > or = to 61 on FOTO for improved function.  Baseline:  Goal status: INITIAL  6.  Patient will be able to walk in the community and run errands with < or = to 2/10 knee pain. Baseline:  Goal status: INITIAL   PLAN:  PT FREQUENCY: 2x/week  PT DURATION: 12 weeks  PLANNED INTERVENTIONS: 97164- PT Re-evaluation, 97110-Therapeutic exercises, 97530- Therapeutic activity, 97112- Neuromuscular re-education, 97535- Self Care, 02859- Manual therapy, [phone]- Gait training, [phone]- Aquatic Therapy, 97014- Electrical stimulation (unattended), [phone]- Electrical stimulation (manual), [id number]- Vasopneumatic device, [id number]- Ultrasound, [id number]- Traction (mechanical), [id number]- Ionotophoresis 4mg /ml Dexamethasone , Patient/Family education, Balance training, Stair training, Taping, Dry Needling, Joint mobilization, Joint manipulation, Spinal manipulation, Spinal mobilization, Scar mobilization, Vestibular training, Cryotherapy, and Moist heat  PLAN FOR NEXT SESSION: continue working on knee mobility; standing hip exercises with walker   Kristeen Sar, PT 03/26/23 11:30 AM [facility] [address] [place], [place] [address] Phone # [phone] Fax [phone]

## 2023-03-27 ENCOUNTER — Encounter: Payer: Self-pay | Admitting: Physical Therapy

## 2023-03-27 ENCOUNTER — Ambulatory Visit: Payer: Self-pay | Admitting: Physical Therapy

## 2023-03-27 DIAGNOSIS — M25661 Stiffness of right knee, not elsewhere classified: Secondary | ICD-10-CM | POA: Diagnosis not present

## 2023-03-27 DIAGNOSIS — R600 Localized edema: Secondary | ICD-10-CM

## 2023-03-27 DIAGNOSIS — G8929 Other chronic pain: Secondary | ICD-10-CM

## 2023-03-27 DIAGNOSIS — R262 Difficulty in walking, not elsewhere classified: Secondary | ICD-10-CM

## 2023-03-27 NOTE — Therapy (Signed)
 OUTPATIENT PHYSICAL THERAPY LOWER EXTREMITY TREATMENT   Patient Name: Katelyn Peterson MRN: [id number] DOB:[birth year], 78 y.o., female Today's Date: 03/27/2023  END OF SESSION:  PT End of Session - 03/27/23 1204     Visit Number 3    Date for PT Re-Evaluation 06/28/23    Authorization Type [employer]    PT Start Time 1101    PT Stop Time 1200    PT Time Calculation (min) 59 min    Activity Tolerance Patient tolerated treatment well    Behavior During Therapy WFL for tasks assessed/performed               Past Medical History:  Diagnosis Date   Anemia    Arthritis    HTN (hypertension)    Hyperlipidemia    Obesity    Type II or unspecified type diabetes mellitus without mention of complication, not stated as uncontrolled    Past Surgical History:  Procedure Laterality Date   ABDOMINAL HYSTERECTOMY  1985   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   Procedure: TOTAL KNEE ARTHROPLASTY;  Surgeon: Liliane Rei, MD;  Location: [facility];  Service: Orthopedics;  Laterality: Right;   Patient Active Problem List   Diagnosis Date Noted   OA (osteoarthritis) of knee 03/18/2023   Primary osteoarthritis of right knee 03/18/2023   Preop exam for internal medicine 12/20/2022   Nuclear sclerotic cataract of right eye 12/07/2022   B12 deficiency 06/20/2022   Colon polyps 06/19/2022   Arthritis of left foot 04/11/2021   CRI (chronic renal insufficiency), stage 3 (moderate) (HCC) 12/15/2020   Wrist pain, left 12/10/2018   Knee pain, right 06/19/2017   Anemia 05/08/2016   Onychomycosis 06/22/2015   Arthritis of ankle 04/01/2015   Well adult exam 08/16/2014   Goiter 11/04/2012   Left ankle pain 08/22/2011   Grief 04/20/2011   Rhinitis 04/20/2011   ABDOMINAL DISTENSION 03/09/2010   KNEE PAIN 03/04/2008   LEG CRAMPS 09/03/2007   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) 04/11/2007   Obesity 04/11/2007   Dyslipidemia associated  with type 2 diabetes mellitus (HCC) 11/26/2006   Essential hypertension 11/26/2006    PCP: Genia Kettering, MD   REFERRING PROVIDER: Dolphus Friday, PA  REFERRING DIAG: M17.11 (ICD-10-CM) - Unilateral primary osteoarthritis, right knee  THERAPY DIAG:  Stiffness of right knee, not elsewhere classified  Chronic pain of right knee  Difficulty in walking, not elsewhere classified  Localized edema  Rationale for Evaluation and Treatment: Rehabilitation  ONSET DATE: 03/18/2023  SUBJECTIVE:   SUBJECTIVE STATEMENT: Patient reports she was very sore after yesterdays treatment session. She was not able to move around a lot.  From Eval: Patient presents post Rt TKA on 1/6. She has been walking once a day for 10 minutes. She was not given any exercises. She has increased pain with functional movements (sit to stand, getting in and out of a car, etc). She does not have any pain if her Rt knee if it is extended. She wants to get back to walking normally when running errands.  PERTINENT HISTORY: HTN, Anemia; Type II diabetes  PAIN: 03/27/2023 Are you having pain? Yes: NPRS scale: 2/10 Pain location: Rt leg Pain description: achy dull Aggravating factors: getting out car; sitting down with knee flexed Relieving factors: Pain medication  PRECAUTIONS: Other: Status post Rt TKA 03/18/2023  RED FLAGS: None   WEIGHT BEARING RESTRICTIONS: No  FALLS:  Has patient fallen in last 6 months?  No  LIVING ENVIRONMENT: Lives with: lives with their family Lives in: House/apartment Stairs: Yes: Internal: 1 steps; none Has following equipment at home: Otho Blitz - 2 wheeled and shower chair  OCCUPATION: Psychologist, sport and exercise at [employer]  PLOF: Independent and Independent with basic ADLs  PATIENT GOALS: To get the pain gone  NEXT MD VISIT: 04/02/2023  OBJECTIVE:  Note: Objective measures were completed at Evaluation unless otherwise noted.   PATIENT SURVEYS:  FOTO 31; goal  91  COGNITION: Overall cognitive status: Within functional limits for tasks assessed     SENSATION: Rt knee Numbness   EDEMA:  Rt knee 3 cm above patella: 54.5cm 3 cam below patella:51cm At patella : 51.5 cm   POSTURE: rounded shoulders and forward head   LOWER EXTREMITY ROM: seated in wheelchair  Active ROM Right eval Left eval  Hip flexion    Hip extension    Hip abduction    Hip adduction    Hip internal rotation    Hip external rotation    Knee flexion 56   Knee extension -2   Ankle dorsiflexion    Ankle plantarflexion    Ankle inversion    Ankle eversion     (Blank rows = not tested)  LOWER EXTREMITY MMT: Deferred      FUNCTIONAL TESTS:  5 times sit to stand: 24.11 with Rt knee extended & UE support 03/26/2023 TUG: 42.59 03/27/2023  with front wheeled walker 19ft GAIT: Patient did not ambulate during treatment session due to increased pain. Patient required max assistance with car transfer                                                                                                                                 TREATMENT DATE:  03/27/2023 NuStep Level 1 5 mins- PT present to discuss status 3 MWT: 84 ft with front wheeled walker Standing hip flexion, hip abduction in front of walker x 10 each Rt knee flexion with slider x 20 Gastroc stretch on rockerbaord 2 x 30 sec Semi-reclined Rt knee flexion with gait belt 2 x 10 Semi-reclined quad set with towel under knee x 20 Manual PROM Rt knee flexion  Vasopneumatic  compression Rt knee low pressure x 10 minutes     03/26/2023 NuStep Level 1 5 mins- PT present to discuss status TUG:42.59 sec with front wheeled walking Rt knee flexion with slider x 20 Seated hamstring stretch 2 x 30 sec Rt  Semi-reclined Rt knee flexion with gait belt 2 x 10 Semi-reclined quad set with towel under knee x 20 Medial/ lateral weight shifting x 15; Rt LE infront shifting x 15 Manual PROM Rt knee flexion   Vasopneumatic  compression Rt knee low pressure x 8 minutes   03/21/2023 Initial Evaluation & HEP created Vasopneumatic  compression Rt knee low pressure x 15 minutes     PATIENT EDUCATION:  Education details: HEP; POC Person educated: Patient Education method: Explanation,  Demonstration, and Handouts Education comprehension: verbalized understanding, returned demonstration, and needs further education  HOME EXERCISE PROGRAM: Access Code: [email or web address] URL: [email or web address] Date: 03/27/2023 Prepared by: Penelope Bowie  Exercises - Seated Heel Slide  - 1 x daily - 7 x weekly - 2 sets - 10 reps - Sitting Heel Slide with Towel  - 1 x daily - 7 x weekly - 2 sets - 10 reps - Supine Quad Set  - 1 x daily - 7 x weekly - 2 sets - 10 reps - 2-3 hold - Active Straight Leg Raise with Quad Set  - 1 x daily - 7 x weekly - 2 sets - 10 reps - Seated Hamstring Stretch  - 1 x daily - 7 x weekly - 2 sets - 10 reps - Standing March with Counter Support  - 1 x daily - 7 x weekly - 1 sets - 10 reps - Standing Hip Abduction with Counter Support  - 1 x daily - 7 x weekly - 1 sets - 10 reps  ASSESSMENT:  CLINICAL IMPRESSION: Today's treatment session focused on Rt knee mobility and flexibility. Eren verbalized increased Rt leg soreness after last treatment session. She tolerated walking for three minutes to get a baseline for . Patient ambulated 84 feet with front wheeled walker with a step to gait pattern and decreased stance on Lt. Noted increased fatigue today and patient required rest breaks after exercises. Educated patient on healing time frames and she is where she shoulder be 9 days post TKA. Patient will benefit from skilled PT to address the below impairments and improve overall function.     OBJECTIVE IMPAIRMENTS: Abnormal gait, decreased mobility, difficulty walking, decreased ROM, decreased strength, increased edema, increased muscle spasms, impaired flexibility,  postural dysfunction, obesity, and pain.   ACTIVITY LIMITATIONS: lifting, standing, squatting, stairs, transfers, and bed mobility  PARTICIPATION LIMITATIONS: cleaning, laundry, driving, shopping, community activity, and occupation  PERSONAL FACTORS: Fitness and 1-2 comorbidities: HTN, Anemia; Type II diabetes   are also affecting patient's functional outcome.   REHAB POTENTIAL: Good  CLINICAL DECISION MAKING: Stable/uncomplicated  EVALUATION COMPLEXITY: Low   GOALS: Goals reviewed with patient? Yes  SHORT TERM GOALS: Target date: 05/02/2023  Patient will be independent with initial HEP. Baseline:  Goal status: INITIAL  2.  Patient will be able to complete a car transfer with < or = to 2/10 knee pain. Baseline:  Goal status: INITIAL  3.  Patient will demonstrate > or = to 90 degrees of knee flexion for improved sit to stand transfer. Baseline:  Goal status: INITIAL    LONG TERM GOALS: Target date: 06/28/2023 Patient will demonstrate independence in advanced HEP. Baseline:  Goal status: INITIAL  2.  Patient will be able to ascend a curb/ step for safe community negotiation. Baseline:  Goal status: INITIAL  3.  Patient will be able to return to work and complete all work duties. Baseline:  Goal status: INITIAL  4.  Patient will score < or = to 19 sec on 5 STS for improved functional mobility. Baseline:  Goal status: INITIAL  5.  Patient will score > or = to 61 on FOTO for improved function.  Baseline:  Goal status: INITIAL  6.  Patient will be able to walk in the community and run errands with < or = to 2/10 knee pain. Baseline:  Goal status: INITIAL   PLAN:  PT FREQUENCY: 2x/week  PT DURATION: 12 weeks  PLANNED INTERVENTIONS: 97164- PT Re-evaluation, 97110-Therapeutic exercises, 97530- Therapeutic  activity, [id number]- Neuromuscular re-education, [phone]- Self Care, 60454- Manual therapy, [phone]- Gait training, [phone]- Aquatic Therapy, [phone]- Electrical  stimulation (unattended), [phone]- Electrical stimulation (manual), [id number]- Vasopneumatic device, [id number]- Ultrasound, [id number]- Traction (mechanical), [id number]- Ionotophoresis 4mg /ml Dexamethasone , Patient/Family education, Balance training, Stair training, Taping, Dry Needling, Joint mobilization, Joint manipulation, Spinal manipulation, Spinal mobilization, Scar mobilization, Vestibular training, Cryotherapy, and Moist heat  PLAN FOR NEXT SESSION: assess HEP compliance; weight shifts; sit to stand; knee flexion stretch on step  Penelope Bowie, PT 03/27/23 12:05 PM [facility] [address], [address] [place], [place] [address] Phone # [phone] Fax [phone]

## 2023-03-27 NOTE — Discharge Summary (Signed)
 Patient ID: Katelyn Peterson MRN: [id number] DOB/AGE: [birth year] 78 y.o.  Admit date: 03/18/2023 Discharge date: 03/19/2023  Admission Diagnoses:  Principal Problem:   OA (osteoarthritis) of knee Active Problems:   Primary osteoarthritis of right knee   Discharge Diagnoses:  Same  Past Medical History:  Diagnosis Date   Anemia    Arthritis    HTN (hypertension)    Hyperlipidemia    Obesity    Type II or unspecified type diabetes mellitus without mention of complication, not stated as uncontrolled     Surgeries: Procedure(s): TOTAL KNEE ARTHROPLASTY on 03/18/2023   Consultants:   Discharged Condition: Improved  Hospital Course: Ryliegh SAYA MCCOLL is an 78 y.o. female who was admitted 03/18/2023 for operative treatment ofOA (osteoarthritis) of knee. Patient has severe unremitting pain that affects sleep, daily activities, and work/hobbies. After pre-op clearance the patient was taken to the operating room on 03/18/2023 and underwent  Procedure(s): TOTAL KNEE ARTHROPLASTY.    Patient was given perioperative antibiotics:  Anti-infectives (From admission, onward)    Start     Dose/Rate Route Frequency Ordered Stop   03/18/23 1800  ceFAZolin  (ANCEF ) IVPB 2g/100 mL premix        2 g 200 mL/hr over 30 Minutes Intravenous Every 6 hours 03/18/23 1641 03/18/23 2347   03/18/23 0845  ceFAZolin  (ANCEF ) IVPB 2g/100 mL premix        2 g 200 mL/hr over 30 Minutes Intravenous On call to O.R. 03/18/23 9163 03/18/23 1212        Patient was given sequential compression devices, early ambulation, and chemoprophylaxis to prevent DVT.  Patient benefited maximally from hospital stay and there were no complications.    Recent vital signs: No data found.   Recent laboratory studies: No results for input(s): WBC, HGB, HCT, PLT, NA, K, CL, CO2, BUN, CREATININE, GLUCOSE, INR, CALCIUM  in the last 72 hours.  Invalid input(s): PT, 2   Discharge Medications:    Allergies as of 03/19/2023       Reactions   Atorvastatin     cramps   Hydrochlorothiazide    Cramps   Pravastatin     cramps        Medication List     STOP taking these medications    aspirin  EC 81 MG tablet Replaced by: Aspirin  Low Dose 81 MG chewable tablet   GOODY HEADACHE PO       TAKE these medications    ascorbic acid 500 MG tablet Commonly known as: VITAMIN C Take 500 mg by mouth daily.   Aspirin  Low Dose 81 MG chewable tablet Generic drug: aspirin  Chew 1 tablet (81 mg total) by mouth 2 (two) times daily for 21 days. Then resume one 81 mg aspirin  once a day Replaces: aspirin  EC 81 MG tablet   atorvastatin  20 MG tablet Commonly known as: LIPITOR TAKE 1 TABLET BY MOUTH ONCE DAILY What changed:  how much to take when to take this   B-12 COMPLIANCE INJECTION IJ Inject 1 Dose as directed every 14 (fourteen) days. What changed: Another medication with the same name was removed. Continue taking this medication, and follow the directions you see here.   empagliflozin  10 MG Tabs tablet Commonly known as: Jardiance  Take 1 tablet (10 mg total) by mouth daily before breakfast.   Iron  (Ferrous Sulfate ) 325 (65 Fe) MG Tabs Take 1 tablet by mouth daily.   lisinopril  40 MG tablet Commonly known as: ZESTRIL  Take 1 tablet (40 mg total) by mouth daily.  metFORMIN  500 MG tablet Commonly known as: GLUCOPHAGE  Take 1 tablet (500 mg total) by mouth daily.   methocarbamol  500 MG tablet Commonly known as: ROBAXIN  Take 1 tablet (500 mg total) by mouth every 6 (six) hours as needed for muscle spasms.   omeprazole  20 MG capsule Commonly known as: PRILOSEC Take 1 capsule (20 mg total) by mouth 2 (two) times daily for 14 days, THEN 1 capsule (20 mg total) daily for 14 days, THEN 1 capsule (20 mg total) daily. Start taking on: October 19, 2022 What changed: See the new instructions.   omeprazole  20 MG capsule Commonly known as: PRILOSEC Take 1 capsule (20 mg total) by  mouth daily. What changed: Another medication with the same name was changed. Make sure you understand how and when to take each.   ondansetron  4 MG tablet Commonly known as: ZOFRAN  Take 1 tablet (4 mg total) by mouth every 6 (six) hours as needed for nausea.   oxyCODONE  5 MG immediate release tablet Commonly known as: Oxy IR/ROXICODONE  Take 1-2 tablets (5-10 mg total) by mouth every 6 (six) hours as needed for severe pain (pain score 7-10).   Ozempic  (0.25 or 0.5 MG/DOSE) 2 MG/3ML Sopn Generic drug: Semaglutide (0.25 or 0.5MG /DOS) Inject 0.5 mg into the skin once a week.   spironolactone  50 MG tablet Commonly known as: ALDACTONE  Take 1 tablet (50 mg total) by mouth daily.   traMADol  50 MG tablet Commonly known as: ULTRAM  Take 1 tablet (50 mg total) by mouth every 6 (six) hours as needed.   verapamil  240 MG 24 hr capsule Commonly known as: VERELAN  Take 1 capsule (240 mg total) by mouth at bedtime.   Vitamin D3 250 MCG (10000 UT) capsule Take 10,000 Units by mouth daily.               Discharge Care Instructions  (From admission, onward)           Start     Ordered   03/19/23 0000  Weight bearing as tolerated        03/19/23 0830   03/19/23 0000  Change dressing       Comments: You may remove the bulky bandage (ACE wrap and gauze) two days after surgery. You will have an adhesive waterproof bandage underneath. Leave this in place until your first follow-up appointment.   03/19/23 0830            Diagnostic Studies: No results found.  Disposition: Discharge disposition: 01-Home or Self Care       Discharge Instructions     Call MD / Call 911   Complete by: As directed    If you experience chest pain or shortness of breath, CALL 911 and be transported to the hospital emergency room.  If you develope a fever above 101 F, pus (white drainage) or increased drainage or redness at the wound, or calf pain, call your surgeon's office.   Change dressing    Complete by: As directed    You may remove the bulky bandage (ACE wrap and gauze) two days after surgery. You will have an adhesive waterproof bandage underneath. Leave this in place until your first follow-up appointment.   Constipation Prevention   Complete by: As directed    Drink plenty of fluids.  Prune juice may be helpful.  You may use a stool softener, such as Colace (over the counter) 100 mg twice a day.  Use MiraLax  (over the counter) for constipation as needed.   Diet -  low sodium heart healthy   Complete by: As directed    Do not put a pillow under the knee. Place it under the heel.   Complete by: As directed    Driving restrictions   Complete by: As directed    No driving for two weeks   Post-operative opioid taper instructions:   Complete by: As directed    POST-OPERATIVE OPIOID TAPER INSTRUCTIONS: It is important to wean off of your opioid medication as soon as possible. If you do not need pain medication after your surgery it is ok to stop day one. Opioids include: Codeine, Hydrocodone(Norco, Vicodin), Oxycodone (Percocet, oxycontin ) and hydromorphone amongst others.  Long term and even short term use of opiods can cause: Increased pain response Dependence Constipation Depression Respiratory depression And more.  Withdrawal symptoms can include Flu like symptoms Nausea, vomiting And more Techniques to manage these symptoms Hydrate well Eat regular healthy meals Stay active Use relaxation techniques(deep breathing, meditating, yoga) Do Not substitute Alcohol to help with tapering If you have been on opioids for less than two weeks and do not have pain than it is ok to stop all together.  Plan to wean off of opioids This plan should start within one week post op of your joint replacement. Maintain the same interval or time between taking each dose and first decrease the dose.  Cut the total daily intake of opioids by one tablet each day Next start to increase the  time between doses. The last dose that should be eliminated is the evening dose.      TED hose   Complete by: As directed    Use stockings (TED hose) for three weeks on both leg(s).  You may remove them at night for sleeping.   Weight bearing as tolerated   Complete by: As directed         Follow-up Information     Aluisio, Dempsey, MD Follow up in 2 week(s).   Specialty: Orthopedic Surgery Contact information: [address] [place] [address] [phone]                  Signed: Roxie Mess 03/27/2023, 11:18 AM

## 2023-04-01 ENCOUNTER — Other Ambulatory Visit (HOSPITAL_COMMUNITY): Payer: Self-pay

## 2023-04-01 ENCOUNTER — Other Ambulatory Visit: Payer: Self-pay | Admitting: Internal Medicine

## 2023-04-02 ENCOUNTER — Other Ambulatory Visit (HOSPITAL_COMMUNITY): Payer: Self-pay

## 2023-04-02 ENCOUNTER — Encounter: Payer: Self-pay | Admitting: Physical Therapy

## 2023-04-02 ENCOUNTER — Ambulatory Visit: Payer: Commercial Managed Care - PPO | Admitting: Physical Therapy

## 2023-04-02 DIAGNOSIS — R600 Localized edema: Secondary | ICD-10-CM

## 2023-04-02 DIAGNOSIS — M25661 Stiffness of right knee, not elsewhere classified: Secondary | ICD-10-CM

## 2023-04-02 DIAGNOSIS — G8929 Other chronic pain: Secondary | ICD-10-CM

## 2023-04-02 DIAGNOSIS — R262 Difficulty in walking, not elsewhere classified: Secondary | ICD-10-CM

## 2023-04-02 MED ORDER — SPIRONOLACTONE 50 MG PO TABS
50.0000 mg | ORAL_TABLET | Freq: Every day | ORAL | 3 refills | Status: AC
  Filled 2023-04-02: qty 90, 90d supply, fill #0
  Filled 2023-06-27: qty 90, 90d supply, fill #1
  Filled 2023-11-01: qty 90, 90d supply, fill #2
  Filled 2024-01-22: qty 90, 90d supply, fill #3

## 2023-04-02 NOTE — Therapy (Signed)
OUTPATIENT PHYSICAL THERAPY LOWER EXTREMITY TREATMENT   Patient Name: Katelyn Peterson MRN: [id number] DOB:08/06/1945, 78 y.o., female Today's Date: 04/02/2023  END OF SESSION:  PT End of Session - 04/02/23 1552     Visit Number 4    Date for PT Re-Evaluation 06/28/23    Authorization Type [facility] [employer]    PT Start Time 1444    PT Stop Time 1545    PT Time Calculation (min) 61 min    Activity Tolerance Patient tolerated treatment well    Behavior During Therapy WFL for tasks assessed/performed                Past Medical History:  Diagnosis Date   Anemia    Arthritis    HTN (hypertension)    Hyperlipidemia    Obesity    Type II or unspecified type diabetes mellitus without mention of complication, not stated as uncontrolled    Past Surgical History:  Procedure Laterality Date   ABDOMINAL HYSTERECTOMY  1985   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   Procedure: TOTAL KNEE ARTHROPLASTY;  Surgeon: Ollen Gross, MD;  Location: [facility];  Service: Orthopedics;  Laterality: Right;   Patient Active Problem List   Diagnosis Date Noted   OA (osteoarthritis) of knee 03/18/2023   Primary osteoarthritis of right knee 03/18/2023   Preop exam for internal medicine 12/20/2022   Nuclear sclerotic cataract of right eye 12/07/2022   B12 deficiency 06/20/2022   Colon polyps 06/19/2022   Arthritis of left foot 04/11/2021   CRI (chronic renal insufficiency), stage 3 (moderate) (HCC) 12/15/2020   Wrist pain, left 12/10/2018   Knee pain, right 06/19/2017   Anemia 05/08/2016   Onychomycosis 06/22/2015   Arthritis of ankle 04/01/2015   Well adult exam 08/16/2014   Goiter 11/04/2012   Left ankle pain 08/22/2011   Grief 04/20/2011   Rhinitis 04/20/2011   ABDOMINAL DISTENSION 03/09/2010   KNEE PAIN 03/04/2008   LEG CRAMPS 09/03/2007   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) 04/11/2007   Obesity 04/11/2007   Dyslipidemia associated  with type 2 diabetes mellitus (HCC) 11/26/2006   Essential hypertension 11/26/2006    PCP: Tresa Garter, MD   REFERRING PROVIDER: Derenda Fennel, PA  REFERRING DIAG: M17.11 (ICD-10-CM) - Unilateral primary osteoarthritis, right knee  THERAPY DIAG:  Stiffness of right knee, not elsewhere classified  Chronic pain of right knee  Difficulty in walking, not elsewhere classified  Localized edema  Rationale for Evaluation and Treatment: Rehabilitation  ONSET DATE: 03/18/2023  SUBJECTIVE:   SUBJECTIVE STATEMENT: Patient reports she is doing good today. She when to the MD today for a follow up appointment. They measured her knee flexion ROM at 85 degrees  From Eval: Patient presents post Rt TKA on 1/6. She has been walking once a day for 10 minutes. She was not given any exercises. She has increased pain with functional movements (sit to stand, getting in and out of a car, etc). She does not have any pain if her Rt knee if it is extended. She wants to get back to walking normally when running errands.  PERTINENT HISTORY: HTN, Anemia; Type II diabetes  PAIN: 04/02/2023 Are you having pain? Yes: NPRS scale: 0/10 Pain location: Rt leg Pain description: achy dull Aggravating factors: getting out car; sitting down with knee flexed Relieving factors: Pain medication  PRECAUTIONS: Other: Status post Rt TKA 03/18/2023  RED FLAGS: None   WEIGHT BEARING RESTRICTIONS: No  FALLS:  Has patient fallen in last 6 months? No  LIVING ENVIRONMENT: Lives with: lives with their family Lives in: House/apartment Stairs: Yes: Internal: 1 steps; none Has following equipment at home: Dan Humphreys - 2 wheeled and shower chair  OCCUPATION: Psychologist, sport and exercise at [employer]  PLOF: Independent and Independent with basic ADLs  PATIENT GOALS: To get the pain gone  NEXT MD VISIT: 04/02/2023  OBJECTIVE:  Note: Objective measures were completed at Evaluation unless otherwise noted.   PATIENT SURVEYS:   FOTO 31; goal 66  COGNITION: Overall cognitive status: Within functional limits for tasks assessed     SENSATION: Rt knee Numbness   EDEMA:  Rt knee 3 cm above patella: 54.5cm 3 cam below patella:51cm At patella : 51.5 cm   POSTURE: rounded shoulders and forward head   LOWER EXTREMITY ROM: seated in wheelchair  Active ROM Right eval Left eval  Hip flexion    Hip extension    Hip abduction    Hip adduction    Hip internal rotation    Hip external rotation    Knee flexion 56   Knee extension -2   Ankle dorsiflexion    Ankle plantarflexion    Ankle inversion    Ankle eversion     (Blank rows = not tested)  LOWER EXTREMITY MMT: Deferred      FUNCTIONAL TESTS:  5 times sit to stand: 24.11 with Rt knee extended & UE support 03/26/2023 TUG: 42.59 03/27/2023  with front wheeled walker 73ft GAIT: Patient did not ambulate during treatment session due to increased pain. Patient required max assistance with car transfer                                                                                                                                 TREATMENT DATE:  04/02/2023 NuStep Level 1 6 mins- PT present to discuss status Rt knee flexion with slider x 20 Hamstring stretch at 1st stair 2 x 30 sec Knee flexion stretch on stair 3 x 20 sec Standing hip flexion, hip abduction in front of walker x 10 each Sit to stand from plinth + airex 2 x 5 (hands on knees) Semi-reclined Rt knee flexion with gait belt 2 x 10 Semi-reclined quad set with towel under knee x 20 Standing heel raises x 20 Seated LAQ 2 x 10 (small ROM) Manual PROM Rt knee flexion patient seated with knee slider under foot. STM to Rt gastroc Vasopneumatic  compression Rt knee low pressure x 15 minutes   03/27/2023 NuStep Level 1 5 mins- PT present to discuss status 3 MWT: 84 ft with front wheeled walker Standing hip flexion, hip abduction in front of walker x 10 each Rt knee flexion with slider  x 20 Gastroc stretch on rockerbaord 2 x 30 sec Semi-reclined Rt knee flexion with gait belt 2 x 10 Semi-reclined quad set with towel under knee x 20 Manual PROM Rt knee flexion  Vasopneumatic  compression Rt knee low pressure x 10 minutes     03/26/2023 NuStep Level 1 5 mins- PT present to discuss status TUG:42.59 sec with front wheeled walking Rt knee flexion with slider x 20 Seated hamstring stretch 2 x 30 sec Rt  Semi-reclined Rt knee flexion with gait belt 2 x 10 Semi-reclined quad set with towel under knee x 20 Medial/ lateral weight shifting x 15; Rt LE infront shifting x 15 Manual PROM Rt knee flexion  Vasopneumatic  compression Rt knee low pressure x 8 minutes        PATIENT EDUCATION:  Education details: HEP; POC Person educated: Patient Education method: Programmer, multimedia, Demonstration, and Handouts Education comprehension: verbalized understanding, returned demonstration, and needs further education  HOME EXERCISE PROGRAM: Access Code: [email or web address] URL: [email or web address] Date: 03/27/2023 Prepared by: Claude Manges  Exercises - Seated Heel Slide  - 1 x daily - 7 x weekly - 2 sets - 10 reps - Sitting Heel Slide with Towel  - 1 x daily - 7 x weekly - 2 sets - 10 reps - Supine Quad Set  - 1 x daily - 7 x weekly - 2 sets - 10 reps - 2-3 hold - Active Straight Leg Raise with Quad Set  - 1 x daily - 7 x weekly - 2 sets - 10 reps - Seated Hamstring Stretch  - 1 x daily - 7 x weekly - 2 sets - 10 reps - Standing March with Counter Support  - 1 x daily - 7 x weekly - 1 sets - 10 reps - Standing Hip Abduction with Counter Support  - 1 x daily - 7 x weekly - 1 sets - 10 reps  ASSESSMENT:  CLINICAL IMPRESSION: Today's treatment session focused on Rt knee mobility and flexibility. Posey had her follow up appointment with her surgeon today and she is progressing appropriately. They measured her Rt knee flexion ROM at 85 degrees. Noted improved gait mechanics  and more weight acceptance in Rt LE. Educated patient in the importance of performing terminal knee extension exercise for improved extension. Patient required verbal and visual cues for correct exercise performance. Patient responded favorably to manual soft tissue techniques to Rt gastroc. Patient will benefit from skilled PT to address the below impairments and improve overall function.      OBJECTIVE IMPAIRMENTS: Abnormal gait, decreased mobility, difficulty walking, decreased ROM, decreased strength, increased edema, increased muscle spasms, impaired flexibility, postural dysfunction, obesity, and pain.   ACTIVITY LIMITATIONS: lifting, standing, squatting, stairs, transfers, and bed mobility  PARTICIPATION LIMITATIONS: cleaning, laundry, driving, shopping, community activity, and occupation  PERSONAL FACTORS: Fitness and 1-2 comorbidities: HTN, Anemia; Type II diabetes   are also affecting patient's functional outcome.   REHAB POTENTIAL: Good  CLINICAL DECISION MAKING: Stable/uncomplicated  EVALUATION COMPLEXITY: Low   GOALS: Goals reviewed with patient? Yes  SHORT TERM GOALS: Target date: 05/02/2023  Patient will be independent with initial HEP. Baseline:  Goal status: INITIAL  2.  Patient will be able to complete a car transfer with < or = to 2/10 knee pain. Baseline:  Goal status: INITIAL  3.  Patient will demonstrate > or = to 90 degrees of knee flexion for improved sit to stand transfer. Baseline:  Goal status: INITIAL    LONG TERM GOALS: Target date: 06/28/2023 Patient will demonstrate independence in advanced HEP. Baseline:  Goal status: INITIAL  2.  Patient will be able to ascend a curb/ step for safe community negotiation. Baseline:  Goal status: INITIAL  3.  Patient will be able to return to work and complete all work duties. Baseline:  Goal status: INITIAL  4.  Patient will score < or = to 19 sec on 5 STS for improved functional mobility. Baseline:   Goal status: INITIAL  5.  Patient will score > or = to 61 on FOTO for improved function.  Baseline:  Goal status: INITIAL  6.  Patient will be able to walk in the community and run errands with < or = to 2/10 knee pain. Baseline:  Goal status: INITIAL   PLAN:  PT FREQUENCY: 2x/week  PT DURATION: 12 weeks  PLANNED INTERVENTIONS: 97164- PT Re-evaluation, 97110-Therapeutic exercises, 97530- Therapeutic activity, 97112- Neuromuscular re-education, 97535- Self Care, 16109- Manual therapy, [phone]- Gait training, [phone]- Aquatic Therapy, 97014- Electrical stimulation (unattended), [phone]- Electrical stimulation (manual), [id number]- Vasopneumatic device, [id number]- Ultrasound, [id number]- Traction (mechanical), [id number]- Ionotophoresis 4mg /ml Dexamethasone, Patient/Family education, Balance training, Stair training, Taping, Dry Needling, Joint mobilization, Joint manipulation, Spinal manipulation, Spinal mobilization, Scar mobilization, Vestibular training, Cryotherapy, and Moist heat  PLAN FOR NEXT SESSION:  weight shifts; knee flexion ROM  Claude Manges, PT 04/02/23 3:53 PM [facility] [address] [place], [place] [address] Phone # [phone] Fax [phone]

## 2023-04-04 ENCOUNTER — Ambulatory Visit: Payer: Commercial Managed Care - PPO | Admitting: Physical Therapy

## 2023-04-04 ENCOUNTER — Encounter: Payer: Self-pay | Admitting: Physical Therapy

## 2023-04-04 DIAGNOSIS — R600 Localized edema: Secondary | ICD-10-CM

## 2023-04-04 DIAGNOSIS — G8929 Other chronic pain: Secondary | ICD-10-CM

## 2023-04-04 DIAGNOSIS — R262 Difficulty in walking, not elsewhere classified: Secondary | ICD-10-CM

## 2023-04-04 DIAGNOSIS — M25661 Stiffness of right knee, not elsewhere classified: Secondary | ICD-10-CM

## 2023-04-04 NOTE — Therapy (Signed)
OUTPATIENT PHYSICAL THERAPY LOWER EXTREMITY TREATMENT   Patient Name: Katelyn Peterson MRN: [id number] DOB:10/14/1945, 78 y.o., female Today's Date: 04/04/2023  END OF SESSION:  PT End of Session - 04/04/23 1708     Visit Number 5    Date for PT Re-Evaluation 06/28/23    Authorization Type [facility] [employer]    PT Start Time 1445    PT Stop Time 1545    PT Time Calculation (min) 60 min    Activity Tolerance Patient tolerated treatment well    Behavior During Therapy WFL for tasks assessed/performed                 Past Medical History:  Diagnosis Date   Anemia    Arthritis    HTN (hypertension)    Hyperlipidemia    Obesity    Type II or unspecified type diabetes mellitus without mention of complication, not stated as uncontrolled    Past Surgical History:  Procedure Laterality Date   ABDOMINAL HYSTERECTOMY  1985   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   Procedure: TOTAL KNEE ARTHROPLASTY;  Surgeon: Ollen Gross, MD;  Location: [facility];  Service: Orthopedics;  Laterality: Right;   Patient Active Problem List   Diagnosis Date Noted   OA (osteoarthritis) of knee 03/18/2023   Primary osteoarthritis of right knee 03/18/2023   Preop exam for internal medicine 12/20/2022   Nuclear sclerotic cataract of right eye 12/07/2022   B12 deficiency 06/20/2022   Colon polyps 06/19/2022   Arthritis of left foot 04/11/2021   CRI (chronic renal insufficiency), stage 3 (moderate) (HCC) 12/15/2020   Wrist pain, left 12/10/2018   Knee pain, right 06/19/2017   Anemia 05/08/2016   Onychomycosis 06/22/2015   Arthritis of ankle 04/01/2015   Well adult exam 08/16/2014   Goiter 11/04/2012   Left ankle pain 08/22/2011   Grief 04/20/2011   Rhinitis 04/20/2011   ABDOMINAL DISTENSION 03/09/2010   KNEE PAIN 03/04/2008   LEG CRAMPS 09/03/2007   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) 04/11/2007   Obesity 04/11/2007   Dyslipidemia  associated with type 2 diabetes mellitus (HCC) 11/26/2006   Essential hypertension 11/26/2006    PCP: Tresa Garter, MD   REFERRING PROVIDER: Derenda Fennel, PA  REFERRING DIAG: M17.11 (ICD-10-CM) - Unilateral primary osteoarthritis, right knee  THERAPY DIAG:  Stiffness of right knee, not elsewhere classified  Chronic pain of right knee  Difficulty in walking, not elsewhere classified  Localized edema  Rationale for Evaluation and Treatment: Rehabilitation  ONSET DATE: 03/18/2023  SUBJECTIVE:   SUBJECTIVE STATEMENT: Patient reports she is doing good today. No new complaints.   From Eval: Patient presents post Rt TKA on 1/6. She has been walking once a day for 10 minutes. She was not given any exercises. She has increased pain with functional movements (sit to stand, getting in and out of a car, etc). She does not have any pain if her Rt knee if it is extended. She wants to get back to walking normally when running errands.  PERTINENT HISTORY: HTN, Anemia; Type II diabetes  PAIN: 04/04/2023 Are you having pain? Yes: NPRS scale: 0/10 Pain location: Rt leg Pain description: achy dull Aggravating factors: getting out car; sitting down with knee flexed Relieving factors: Pain medication  PRECAUTIONS: Other: Status post Rt TKA 03/18/2023  RED FLAGS: None   WEIGHT BEARING RESTRICTIONS: No  FALLS:  Has patient fallen in last 6 months? No  LIVING ENVIRONMENT: Lives with:  lives with their family Lives in: House/apartment Stairs: Yes: Internal: 1 steps; none Has following equipment at home: Dan Humphreys - 2 wheeled and shower chair  OCCUPATION: Psychologist, sport and exercise at [employer]  PLOF: Independent and Independent with basic ADLs  PATIENT GOALS: To get the pain gone  NEXT MD VISIT: 04/02/2023  OBJECTIVE:  Note: Objective measures were completed at Evaluation unless otherwise noted.   PATIENT SURVEYS:  FOTO 31; goal 28  COGNITION: Overall cognitive status: Within functional  limits for tasks assessed     SENSATION: Rt knee Numbness   EDEMA:  Rt knee 3 cm above patella: 54.5cm 3 cam below patella:51cm At patella : 51.5 cm   POSTURE: rounded shoulders and forward head   LOWER EXTREMITY ROM: seated in wheelchair  Active ROM Right eval Left eval  Hip flexion    Hip extension    Hip abduction    Hip adduction    Hip internal rotation    Hip external rotation    Knee flexion 56   Knee extension -2   Ankle dorsiflexion    Ankle plantarflexion    Ankle inversion    Ankle eversion     (Blank rows = not tested)  LOWER EXTREMITY MMT: Deferred      FUNCTIONAL TESTS:  5 times sit to stand: 24.11 with Rt knee extended & UE support 03/26/2023 TUG: 42.59 03/27/2023  with front wheeled walker 23ft GAIT: Patient did not ambulate during treatment session due to increased pain. Patient required max assistance with car transfer                                                                                                                                 TREATMENT DATE:  04/04/2023 NuStep Level 1 6 mins- PT present to discuss status Rt knee flexion with slider x 20 M/L weight shifts; A/P weight shifts x 20 each direction Hamstring stretch at 1st stair 2 x 30 sec Knee flexion stretch on stair 3 x 20 sec Flat cone step overs with Rt x 10 with unilateral UE support at bar Mini Squat at bar x 10 Sit to stand from plinth + airex x 12 (hands on knees) Semi-reclined Rt knee flexion with gait belt 2 x 10 Semi-reclined quad set with towel under knee x 20 Manual PROM Rt knee flexion ; STM distal hamstring  Vasopneumatic  compression Rt knee low pressure x 15 minutes    04/02/2023 NuStep Level 1 6 mins- PT present to discuss status Rt knee flexion with slider x 20 Hamstring stretch at 1st stair 2 x 30 sec Knee flexion stretch on stair 3 x 20 sec Standing hip flexion, hip abduction in front of walker x 10 each Sit to stand from plinth + airex 2 x  5 (hands on knees) Semi-reclined Rt knee flexion with gait belt 2 x 10 Semi-reclined quad set with towel under knee x 20 Standing heel raises x 20 Seated  LAQ 2 x 10 (small ROM) Manual PROM Rt knee flexion patient seated with knee slider under foot. STM to Rt gastroc Vasopneumatic  compression Rt knee low pressure x 15 minutes   03/27/2023 NuStep Level 1 5 mins- PT present to discuss status 3 MWT: 84 ft with front wheeled walker Standing hip flexion, hip abduction in front of walker x 10 each Rt knee flexion with slider x 20 Gastroc stretch on rockerbaord 2 x 30 sec Semi-reclined Rt knee flexion with gait belt 2 x 10 Semi-reclined quad set with towel under knee x 20 Manual PROM Rt knee flexion  Vasopneumatic  compression Rt knee low pressure x 10 minutes      PATIENT EDUCATION:  Education details: HEP; POC Person educated: Patient Education method: Programmer, multimedia, Facilities manager, and Handouts Education comprehension: verbalized understanding, returned demonstration, and needs further education  HOME EXERCISE PROGRAM: Access Code: [email or web address] URL: [email or web address] Date: 03/27/2023 Prepared by: Claude Manges  Exercises - Seated Heel Slide  - 1 x daily - 7 x weekly - 2 sets - 10 reps - Sitting Heel Slide with Towel  - 1 x daily - 7 x weekly - 2 sets - 10 reps - Supine Quad Set  - 1 x daily - 7 x weekly - 2 sets - 10 reps - 2-3 hold - Active Straight Leg Raise with Quad Set  - 1 x daily - 7 x weekly - 2 sets - 10 reps - Seated Hamstring Stretch  - 1 x daily - 7 x weekly - 2 sets - 10 reps - Standing March with Counter Support  - 1 x daily - 7 x weekly - 1 sets - 10 reps - Standing Hip Abduction with Counter Support  - 1 x daily - 7 x weekly - 1 sets - 10 reps  ASSESSMENT:  CLINICAL IMPRESSION: Today's treatment session focused on Rt knee mobility and flexibility. Incorporated weight shifts for improved weight acceptance on Rt during gait. Patient tolerated flat cone  step overs well with unilateral upper extremity support. Incorporated more functional movements today and patient required verbal and visual cues for correct exercise performance. Noted increased muscle spasms on Rt hamstring that responded well to manual therapy. Patient will benefit from skilled PT to address the below impairments and improve overall function.       OBJECTIVE IMPAIRMENTS: Abnormal gait, decreased mobility, difficulty walking, decreased ROM, decreased strength, increased edema, increased muscle spasms, impaired flexibility, postural dysfunction, obesity, and pain.   ACTIVITY LIMITATIONS: lifting, standing, squatting, stairs, transfers, and bed mobility  PARTICIPATION LIMITATIONS: cleaning, laundry, driving, shopping, community activity, and occupation  PERSONAL FACTORS: Fitness and 1-2 comorbidities: HTN, Anemia; Type II diabetes   are also affecting patient's functional outcome.   REHAB POTENTIAL: Good  CLINICAL DECISION MAKING: Stable/uncomplicated  EVALUATION COMPLEXITY: Low   GOALS: Goals reviewed with patient? Yes  SHORT TERM GOALS: Target date: 05/02/2023  Patient will be independent with initial HEP. Baseline:  Goal status: INITIAL  2.  Patient will be able to complete a car transfer with < or = to 2/10 knee pain. Baseline:  Goal status: INITIAL  3.  Patient will demonstrate > or = to 90 degrees of knee flexion for improved sit to stand transfer. Baseline:  Goal status: INITIAL    LONG TERM GOALS: Target date: 06/28/2023 Patient will demonstrate independence in advanced HEP. Baseline:  Goal status: INITIAL  2.  Patient will be able to ascend a curb/ step for safe community negotiation. Baseline:  Goal status:  INITIAL  3.  Patient will be able to return to work and complete all work duties. Baseline:  Goal status: INITIAL  4.  Patient will score < or = to 19 sec on 5 STS for improved functional mobility. Baseline:  Goal status:  INITIAL  5.  Patient will score > or = to 61 on FOTO for improved function.  Baseline:  Goal status: INITIAL  6.  Patient will be able to walk in the community and run errands with < or = to 2/10 knee pain. Baseline:  Goal status: INITIAL   PLAN:  PT FREQUENCY: 2x/week  PT DURATION: 12 weeks  PLANNED INTERVENTIONS: 97164- PT Re-evaluation, 97110-Therapeutic exercises, 97530- Therapeutic activity, 97112- Neuromuscular re-education, 97535- Self Care, 10272- Manual therapy, [phone]- Gait training, [phone]- Aquatic Therapy, 97014- Electrical stimulation (unattended), [phone]- Electrical stimulation (manual), [id number]- Vasopneumatic device, [id number]- Ultrasound, [id number]- Traction (mechanical), [id number]- Ionotophoresis 4mg /ml Dexamethasone, Patient/Family education, Balance training, Stair training, Taping, Dry Needling, Joint mobilization, Joint manipulation, Spinal manipulation, Spinal mobilization, Scar mobilization, Vestibular training, Cryotherapy, and Moist heat  PLAN FOR NEXT SESSION:  measure ROM; continue gait mechanics; 2-4in step ups  Claude Manges, PT 04/04/23 5:09 PM [facility] [address], [address] [place], [place] [address] Phone # [phone] Fax [phone]

## 2023-04-09 ENCOUNTER — Other Ambulatory Visit (HOSPITAL_COMMUNITY): Payer: Self-pay

## 2023-04-09 ENCOUNTER — Encounter: Payer: Self-pay | Admitting: Physical Therapy

## 2023-04-09 ENCOUNTER — Other Ambulatory Visit: Payer: Self-pay | Admitting: Internal Medicine

## 2023-04-09 ENCOUNTER — Ambulatory Visit: Payer: Commercial Managed Care - PPO | Admitting: Physical Therapy

## 2023-04-09 DIAGNOSIS — R600 Localized edema: Secondary | ICD-10-CM

## 2023-04-09 DIAGNOSIS — M25661 Stiffness of right knee, not elsewhere classified: Secondary | ICD-10-CM | POA: Diagnosis not present

## 2023-04-09 DIAGNOSIS — G8929 Other chronic pain: Secondary | ICD-10-CM

## 2023-04-09 DIAGNOSIS — R262 Difficulty in walking, not elsewhere classified: Secondary | ICD-10-CM

## 2023-04-09 MED ORDER — VERAPAMIL HCL ER 240 MG PO CP24
240.0000 mg | ORAL_CAPSULE | Freq: Every day | ORAL | 3 refills | Status: AC
  Filled 2023-04-09: qty 90, 90d supply, fill #0
  Filled 2023-06-27: qty 90, 90d supply, fill #1

## 2023-04-09 MED ORDER — LISINOPRIL 40 MG PO TABS
40.0000 mg | ORAL_TABLET | Freq: Every day | ORAL | 3 refills | Status: AC
  Filled 2023-04-09: qty 90, 90d supply, fill #0
  Filled 2023-06-27: qty 90, 90d supply, fill #1
  Filled 2023-10-14: qty 90, 90d supply, fill #2
  Filled 2024-01-22: qty 90, 90d supply, fill #3

## 2023-04-09 NOTE — Therapy (Signed)
OUTPATIENT PHYSICAL THERAPY LOWER EXTREMITY TREATMENT   Patient Name: Katelyn Peterson MRN: [id number] DOB:20-May-1945, 78 y.o., female Today's Date: 04/09/2023  END OF SESSION:  PT End of Session - 04/09/23 1514     Visit Number 6    Date for PT Re-Evaluation 06/28/23    Authorization Type [facility] [employer]    PT Start Time 1358    PT Stop Time 1500    PT Time Calculation (min) 62 min    Activity Tolerance Patient tolerated treatment well    Behavior During Therapy WFL for tasks assessed/performed                  Past Medical History:  Diagnosis Date   Anemia    Arthritis    HTN (hypertension)    Hyperlipidemia    Obesity    Type II or unspecified type diabetes mellitus without mention of complication, not stated as uncontrolled    Past Surgical History:  Procedure Laterality Date   ABDOMINAL HYSTERECTOMY  1985   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   Procedure: TOTAL KNEE ARTHROPLASTY;  Surgeon: Ollen Gross, MD;  Location: [facility];  Service: Orthopedics;  Laterality: Right;   Patient Active Problem List   Diagnosis Date Noted   OA (osteoarthritis) of knee 03/18/2023   Primary osteoarthritis of right knee 03/18/2023   Preop exam for internal medicine 12/20/2022   Nuclear sclerotic cataract of right eye 12/07/2022   B12 deficiency 06/20/2022   Colon polyps 06/19/2022   Arthritis of left foot 04/11/2021   CRI (chronic renal insufficiency), stage 3 (moderate) (HCC) 12/15/2020   Wrist pain, left 12/10/2018   Knee pain, right 06/19/2017   Anemia 05/08/2016   Onychomycosis 06/22/2015   Arthritis of ankle 04/01/2015   Well adult exam 08/16/2014   Goiter 11/04/2012   Left ankle pain 08/22/2011   Grief 04/20/2011   Rhinitis 04/20/2011   ABDOMINAL DISTENSION 03/09/2010   KNEE PAIN 03/04/2008   LEG CRAMPS 09/03/2007   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) 04/11/2007   Obesity 04/11/2007   Dyslipidemia  associated with type 2 diabetes mellitus (HCC) 11/26/2006   Essential hypertension 11/26/2006    PCP: Tresa Garter, MD   REFERRING PROVIDER: Derenda Fennel, PA  REFERRING DIAG: M17.11 (ICD-10-CM) - Unilateral primary osteoarthritis, right knee  THERAPY DIAG:  Stiffness of right knee, not elsewhere classified  Chronic pain of right knee  Difficulty in walking, not elsewhere classified  Localized edema  Rationale for Evaluation and Treatment: Rehabilitation  ONSET DATE: 03/18/2023  SUBJECTIVE:   SUBJECTIVE STATEMENT: Patient reports she is doing good today. She has been compliant with HEP.  From Eval: Patient presents post Rt TKA on 1/6. She has been walking once a day for 10 minutes. She was not given any exercises. She has increased pain with functional movements (sit to stand, getting in and out of a car, etc). She does not have any pain if her Rt knee if it is extended. She wants to get back to walking normally when running errands.  PERTINENT HISTORY: HTN, Anemia; Type II diabetes   PAIN: 04/09/2023 Are you having pain? Yes: NPRS scale: 0/10 Pain location: Rt leg Pain description: achy dull Aggravating factors: getting out car; sitting down with knee flexed Relieving factors: Pain medication  PRECAUTIONS: Other: Status post Rt TKA 03/18/2023  RED FLAGS: None   WEIGHT BEARING RESTRICTIONS: No  FALLS:  Has patient fallen in last 6 months? No  LIVING ENVIRONMENT: Lives with: lives with their family Lives in: House/apartment Stairs: Yes: Internal: 1 steps; none Has following equipment at home: Dan Humphreys - 2 wheeled and shower chair  OCCUPATION: Psychologist, sport and exercise at [employer]  PLOF: Independent and Independent with basic ADLs  PATIENT GOALS: To get the pain gone  NEXT MD VISIT: 04/02/2023  OBJECTIVE:  Note: Objective measures were completed at Evaluation unless otherwise noted.   PATIENT SURVEYS:  FOTO 31; goal 79  COGNITION: Overall cognitive status:  Within functional limits for tasks assessed     SENSATION: Rt knee Numbness   EDEMA:  Rt knee 3 cm above patella: 54.5cm 3 cam below patella:51cm At patella : 51.5 cm   POSTURE: rounded shoulders and forward head   LOWER EXTREMITY ROM: seated in wheelchair  Active ROM Right eval 03/14/2023  Left eval  Hip flexion     Hip extension     Hip abduction     Hip adduction     Hip internal rotation     Hip external rotation     Knee flexion 56 AAROM 90 (semi-reclined) PROM 95   Knee extension -2 -15   Ankle dorsiflexion     Ankle plantarflexion     Ankle inversion     Ankle eversion      (Blank rows = not tested)  LOWER EXTREMITY MMT: Deferred      FUNCTIONAL TESTS:  5 times sit to stand: 24.11 with Rt knee extended & UE support 03/26/2023 TUG: 42.59 03/27/2023  with front wheeled walker 85ft GAIT: Patient did not ambulate during treatment session due to increased pain. Patient required max assistance with car transfer                                                                                                                                 TREATMENT DATE:  04/09/2023 Recumbent Bike Level 1- 6 mins PT present to discuss status (1/2 revolutions)  Hamstring stretch at 1st stair 2 x 30 sec Knee flexion stretch on 2nd  stair 3 x 20 sec Calf stretch on rockerboard 2 x 30 sec Rt knee flexion with slider x 20 Standing hip flexion in walker x 10 each Flat cone step overs with Rt x 15 with unilateral UE support at bar Mini Squat at barre x 10 M/L weight shifts; A/P weight shifts x 20 each direction Semi-reclined Rt knee flexion with gait belt 2 x 10 Semi-reclined quad set with towel under foot x 10 Measured Rt knee ROM in semiclined position : AAROM 90 degrees; PROM 95 degrees Manual Joint Mobs Grade II AP for improved extension  Vasopneumatic  compression Rt knee low pressure x 15 minutes  04/04/2023 NuStep Level 1 6 mins- PT present to discuss status Rt knee  flexion with slider x 20 M/L weight shifts; A/P weight shifts x 20 each direction Hamstring stretch at 1st stair 2 x 30 sec Knee flexion stretch on stair  3 x 20 sec Flat cone step overs with Rt x 10 with unilateral UE support at bar Mini Squat at bar x 10 Sit to stand from plinth + airex x 12 (hands on knees) Semi-reclined Rt knee flexion with gait belt 2 x 10 Semi-reclined quad set with towel under knee x 20 Manual PROM Rt knee flexion ; STM distal hamstring  Vasopneumatic  compression Rt knee low pressure x 15 minutes    04/02/2023 NuStep Level 1 6 mins- PT present to discuss status Rt knee flexion with slider x 20 Hamstring stretch at 1st stair 2 x 30 sec Knee flexion stretch on stair 3 x 20 sec Standing hip flexion, hip abduction in front of walker x 10 each Sit to stand from plinth + airex 2 x 5 (hands on knees) Semi-reclined Rt knee flexion with gait belt 2 x 10 Semi-reclined quad set with towel under knee x 20 Standing heel raises x 20 Seated LAQ 2 x 10 (small ROM) Manual PROM Rt knee flexion patient seated with knee slider under foot. STM to Rt gastroc Vasopneumatic  compression Rt knee low pressure x 15 minutes         PATIENT EDUCATION:  Education details: HEP; POC Person educated: Patient Education method: Explanation, Demonstration, and Handouts Education comprehension: verbalized understanding, returned demonstration, and needs further education  HOME EXERCISE PROGRAM: Access Code: [email or web address] URL: [email or web address] Date: 04/09/2023 Prepared by: Claude Manges  Exercises - Seated Heel Slide  - 1 x daily - 7 x weekly - 2 sets - 10 reps - Sitting Heel Slide with Towel  - 1 x daily - 7 x weekly - 2 sets - 10 reps - Supine Quad Set  - 1 x daily - 7 x weekly - 2 sets - 10 reps - 2-3 hold - Active Straight Leg Raise with Quad Set  - 1 x daily - 7 x weekly - 2 sets - 10 reps - Seated Hamstring Stretch  - 1 x daily - 7 x weekly - 2 sets - 10 reps -  Standing March with Counter Support  - 1 x daily - 7 x weekly - 1 sets - 10 reps - Standing Hip Abduction with Counter Support  - 1 x daily - 7 x weekly - 1 sets - 10 reps - Supine Knee Extension Stretch on Towel Roll  - 1 x daily - 7 x weekly - 2 sets - 10 reps - Seated Knee Extension Stretch with Chair  - 1 x daily - 7 x weekly - 2 sets - 30 hold  ASSESSMENT:  CLINICAL IMPRESSION: Today's treatment session focused on Rt knee mobility and flexibility. Measured PROM Rt knee flexion at 95 degrees today. She is still lacking 10-15 degress of extension. Updated patient's HEP to include exercises to improve extension ROM. Ailee is tolerating therapy well and demonstrating improved strength and endurance. Incorporated recumbent bike today for improved knee flexion ROM. Patient required verbal and visual cues for correct exercise performance.      OBJECTIVE IMPAIRMENTS: Abnormal gait, decreased mobility, difficulty walking, decreased ROM, decreased strength, increased edema, increased muscle spasms, impaired flexibility, postural dysfunction, obesity, and pain.   ACTIVITY LIMITATIONS: lifting, standing, squatting, stairs, transfers, and bed mobility  PARTICIPATION LIMITATIONS: cleaning, laundry, driving, shopping, community activity, and occupation  PERSONAL FACTORS: Fitness and 1-2 comorbidities: HTN, Anemia; Type II diabetes   are also affecting patient's functional outcome.   REHAB POTENTIAL: Good  CLINICAL DECISION MAKING: Stable/uncomplicated  EVALUATION COMPLEXITY: Low  GOALS: Goals reviewed with patient? Yes  SHORT TERM GOALS: Target date: 05/02/2023  Patient will be independent with initial HEP. Baseline:  Goal status: INITIAL  2.  Patient will be able to complete a car transfer with < or = to 2/10 knee pain. Baseline:  Goal status: INITIAL  3.  Patient will demonstrate > or = to 90 degrees of knee flexion for improved sit to stand transfer. Baseline:  Goal status:  INITIAL    LONG TERM GOALS: Target date: 06/28/2023 Patient will demonstrate independence in advanced HEP. Baseline:  Goal status: INITIAL  2.  Patient will be able to ascend a curb/ step for safe community negotiation. Baseline:  Goal status: INITIAL  3.  Patient will be able to return to work and complete all work duties. Baseline:  Goal status: INITIAL  4.  Patient will score < or = to 19 sec on 5 STS for improved functional mobility. Baseline:  Goal status: INITIAL  5.  Patient will score > or = to 61 on FOTO for improved function.  Baseline:  Goal status: INITIAL  6.  Patient will be able to walk in the community and run errands with < or = to 2/10 knee pain. Baseline:  Goal status: INITIAL   PLAN:  PT FREQUENCY: 2x/week  PT DURATION: 12 weeks  PLANNED INTERVENTIONS: 97164- PT Re-evaluation, 97110-Therapeutic exercises, 97530- Therapeutic activity, 97112- Neuromuscular re-education, 97535- Self Care, 16109- Manual therapy, [phone]- Gait training, [id number]- Aquatic Therapy, 97014- Electrical stimulation (unattended), [id number]- Electrical stimulation (manual), [id number]- Vasopneumatic device, [id number]- Ultrasound, [id number]- Traction (mechanical), [id number]- Ionotophoresis 4mg /ml Dexamethasone, Patient/Family education, Balance training, Stair training, Taping, Dry Needling, Joint mobilization, Joint manipulation, Spinal manipulation, Spinal mobilization, Scar mobilization, Vestibular training, Cryotherapy, and Moist heat  PLAN FOR NEXT SESSION:  assess updated ROM; low load prolonged stretch for extension ; standing TKE   Claude Manges, PT 04/09/23 3:16 PM [facility] [address], [address] [place], [place] [address] Phone # [phone] Fax [phone]

## 2023-04-10 ENCOUNTER — Other Ambulatory Visit (HOSPITAL_COMMUNITY): Payer: Self-pay

## 2023-04-11 ENCOUNTER — Ambulatory Visit: Payer: Commercial Managed Care - PPO | Admitting: Physical Therapy

## 2023-04-11 ENCOUNTER — Encounter: Payer: Self-pay | Admitting: Physical Therapy

## 2023-04-11 DIAGNOSIS — R262 Difficulty in walking, not elsewhere classified: Secondary | ICD-10-CM

## 2023-04-11 DIAGNOSIS — M25661 Stiffness of right knee, not elsewhere classified: Secondary | ICD-10-CM | POA: Diagnosis not present

## 2023-04-11 DIAGNOSIS — R600 Localized edema: Secondary | ICD-10-CM

## 2023-04-11 DIAGNOSIS — G8929 Other chronic pain: Secondary | ICD-10-CM

## 2023-04-11 NOTE — Therapy (Signed)
OUTPATIENT PHYSICAL THERAPY LOWER EXTREMITY TREATMENT   Patient Name: Katelyn Peterson MRN: [id number] DOB:1945-12-01, 78 y.o., female Today's Date: 04/11/2023  END OF SESSION:  PT End of Session - 04/11/23 1716     Visit Number 7    Date for PT Re-Evaluation 06/28/23    Authorization Type [facility] [employer]    PT Start Time 1446    PT Stop Time 1545    PT Time Calculation (min) 59 min    Activity Tolerance Patient tolerated treatment well    Behavior During Therapy WFL for tasks assessed/performed                   Past Medical History:  Diagnosis Date   Anemia    Arthritis    HTN (hypertension)    Hyperlipidemia    Obesity    Type II or unspecified type diabetes mellitus without mention of complication, not stated as uncontrolled    Past Surgical History:  Procedure Laterality Date   ABDOMINAL HYSTERECTOMY  1985   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   Procedure: TOTAL KNEE ARTHROPLASTY;  Surgeon: Ollen Gross, MD;  Location: [facility];  Service: Orthopedics;  Laterality: Right;   Patient Active Problem List   Diagnosis Date Noted   OA (osteoarthritis) of knee 03/18/2023   Primary osteoarthritis of right knee 03/18/2023   Preop exam for internal medicine 12/20/2022   Nuclear sclerotic cataract of right eye 12/07/2022   B12 deficiency 06/20/2022   Colon polyps 06/19/2022   Arthritis of left foot 04/11/2021   CRI (chronic renal insufficiency), stage 3 (moderate) (HCC) 12/15/2020   Wrist pain, left 12/10/2018   Knee pain, right 06/19/2017   Anemia 05/08/2016   Onychomycosis 06/22/2015   Arthritis of ankle 04/01/2015   Well adult exam 08/16/2014   Goiter 11/04/2012   Left ankle pain 08/22/2011   Grief 04/20/2011   Rhinitis 04/20/2011   ABDOMINAL DISTENSION 03/09/2010   KNEE PAIN 03/04/2008   LEG CRAMPS 09/03/2007   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) 04/11/2007   Obesity 04/11/2007   Dyslipidemia  associated with type 2 diabetes mellitus (HCC) 11/26/2006   Essential hypertension 11/26/2006    PCP: Tresa Garter, MD   REFERRING PROVIDER: Derenda Fennel, PA  REFERRING DIAG: M17.11 (ICD-10-CM) - Unilateral primary osteoarthritis, right knee  THERAPY DIAG:  Stiffness of right knee, not elsewhere classified  Chronic pain of right knee  Difficulty in walking, not elsewhere classified  Localized edema  Rationale for Evaluation and Treatment: Rehabilitation  ONSET DATE: 03/18/2023  SUBJECTIVE:   SUBJECTIVE STATEMENT: Patient reports she is doing good today. No new complaints.  From Eval: Patient presents post Rt TKA on 1/6. She has been walking once a day for 10 minutes. She was not given any exercises. She has increased pain with functional movements (sit to stand, getting in and out of a car, etc). She does not have any pain if her Rt knee if it is extended. She wants to get back to walking normally when running errands.  PERTINENT HISTORY: HTN, Anemia; Type II diabetes   PAIN: 04/11/2023 Are you having pain? Yes: NPRS scale: 0/10 Pain location: Rt leg Pain description: achy dull Aggravating factors: getting out car; sitting down with knee flexed Relieving factors: Pain medication  PRECAUTIONS: Other: Status post Rt TKA 03/18/2023  RED FLAGS: None   WEIGHT BEARING RESTRICTIONS: No  FALLS:  Has patient fallen in last 6 months? No  LIVING ENVIRONMENT:  Lives with: lives with their family Lives in: House/apartment Stairs: Yes: Internal: 1 steps; none Has following equipment at home: Dan Humphreys - 2 wheeled and shower chair  OCCUPATION: Psychologist, sport and exercise at [employer]  PLOF: Independent and Independent with basic ADLs  PATIENT GOALS: To get the pain gone  NEXT MD VISIT: 04/02/2023  OBJECTIVE:  Note: Objective measures were completed at Evaluation unless otherwise noted.   PATIENT SURVEYS:  FOTO 31; goal 73  COGNITION: Overall cognitive status: Within  functional limits for tasks assessed     SENSATION: Rt knee Numbness   EDEMA:  Rt knee 3 cm above patella: 54.5cm 3 cam below patella:51cm At patella : 51.5 cm   POSTURE: rounded shoulders and forward head   LOWER EXTREMITY ROM: seated in wheelchair  Active ROM Right eval 03/14/2023  Left eval  Hip flexion     Hip extension     Hip abduction     Hip adduction     Hip internal rotation     Hip external rotation     Knee flexion 56 AAROM 90 (semi-reclined) PROM 95   Knee extension -2 -15   Ankle dorsiflexion     Ankle plantarflexion     Ankle inversion     Ankle eversion      (Blank rows = not tested)  LOWER EXTREMITY MMT: Deferred      FUNCTIONAL TESTS:  5 times sit to stand: 24.11 with Rt knee extended & UE support 03/26/2023 TUG: 42.59 03/27/2023  with front wheeled walker 30ft GAIT: Patient did not ambulate during treatment session due to increased pain. Patient required max assistance with car transfer                                                                                                                                 TREATMENT DATE:  04/11/2023 Recumbent Bike Level 1- 7 mins PT present to discuss status (1/2 revolutions)  Hamstring stretch on 6inch step 3 x 30 sec Rt  Calf stretch on rockerboard 3 x 30 sec TKE with red loop 2 x 10 Seated Rt foot on 6 inch step + quad set 2 x 10 Rt knee flexion with slider x 20 Sit to stand (plinth + airex) x 7 Sit to stand (standard chair) x 8 Flat cone step overs with Rt x 10 (focusing on extension) with unilateral UE support Low load prolonged stretch 2 x 2 mins 5# weight on knee Vasopneumatic  compression Rt knee low pressure x 15 minutes    04/09/2023 Recumbent Bike Level 1- 6 mins PT present to discuss status (1/2 revolutions)  Hamstring stretch at 1st stair 2 x 30 sec Knee flexion stretch on 2nd  stair 3 x 20 sec Calf stretch on rockerboard 2 x 30 sec Rt knee flexion with slider x 20 Standing  hip flexion in walker x 10 each Flat cone step overs with Rt x 15 with unilateral UE support  at bar Mini Squat at barre x 10 M/L weight shifts; A/P weight shifts x 20 each direction Semi-reclined Rt knee flexion with gait belt 2 x 10 Semi-reclined quad set with towel under foot x 10 Measured Rt knee ROM in semiclined position : AAROM 90 degrees; PROM 95 degrees Manual Joint Mobs Grade II AP for improved extension  Vasopneumatic  compression Rt knee low pressure x 15 minutes    04/04/2023 NuStep Level 1 6 mins- PT present to discuss status Rt knee flexion with slider x 20 M/L weight shifts; A/P weight shifts x 20 each direction Hamstring stretch at 1st stair 2 x 30 sec Knee flexion stretch on stair 3 x 20 sec Flat cone step overs with Rt x 10 with unilateral UE support at bar Mini Squat at bar x 10 Sit to stand from plinth + airex x 12 (hands on knees) Semi-reclined Rt knee flexion with gait belt 2 x 10 Semi-reclined quad set with towel under knee x 20 Manual PROM Rt knee flexion ; STM distal hamstring  Vasopneumatic  compression Rt knee low pressure x 15 minutes    04/02/2023 NuStep Level 1 6 mins- PT present to discuss status Rt knee flexion with slider x 20 Hamstring stretch at 1st stair 2 x 30 sec Knee flexion stretch on stair 3 x 20 sec Standing hip flexion, hip abduction in front of walker x 10 each Sit to stand from plinth + airex 2 x 5 (hands on knees) Semi-reclined Rt knee flexion with gait belt 2 x 10 Semi-reclined quad set with towel under knee x 20 Standing heel raises x 20 Seated LAQ 2 x 10 (small ROM) Manual PROM Rt knee flexion patient seated with knee slider under foot. STM to Rt gastroc Vasopneumatic  compression Rt knee low pressure x 15 minutes         PATIENT EDUCATION:  Education details: HEP; POC Person educated: Patient Education method: Explanation, Demonstration, and Handouts Education comprehension: verbalized understanding, returned  demonstration, and needs further education  HOME EXERCISE PROGRAM: Access Code: [email or web address] URL: [email or web address] Date: 04/09/2023 Prepared by: Claude Manges  Exercises - Seated Heel Slide  - 1 x daily - 7 x weekly - 2 sets - 10 reps - Sitting Heel Slide with Towel  - 1 x daily - 7 x weekly - 2 sets - 10 reps - Supine Quad Set  - 1 x daily - 7 x weekly - 2 sets - 10 reps - 2-3 hold - Active Straight Leg Raise with Quad Set  - 1 x daily - 7 x weekly - 2 sets - 10 reps - Seated Hamstring Stretch  - 1 x daily - 7 x weekly - 2 sets - 10 reps - Standing March with Counter Support  - 1 x daily - 7 x weekly - 1 sets - 10 reps - Standing Hip Abduction with Counter Support  - 1 x daily - 7 x weekly - 1 sets - 10 reps - Supine Knee Extension Stretch on Towel Roll  - 1 x daily - 7 x weekly - 2 sets - 10 reps - Seated Knee Extension Stretch with Chair  - 1 x daily - 7 x weekly - 2 sets - 30 hold  ASSESSMENT:  CLINICAL IMPRESSION: Today's treatment session focused on Rt knee extension. Noted improved extension compared to previous treatment session. Patient required verbal and visual cues for correct exercise performance. Educated patient to continue focusing on extension based exercises. Patient will benefit from  skilled PT to address the below impairments and improve overall function.       OBJECTIVE IMPAIRMENTS: Abnormal gait, decreased mobility, difficulty walking, decreased ROM, decreased strength, increased edema, increased muscle spasms, impaired flexibility, postural dysfunction, obesity, and pain.   ACTIVITY LIMITATIONS: lifting, standing, squatting, stairs, transfers, and bed mobility  PARTICIPATION LIMITATIONS: cleaning, laundry, driving, shopping, community activity, and occupation  PERSONAL FACTORS: Fitness and 1-2 comorbidities: HTN, Anemia; Type II diabetes   are also affecting patient's functional outcome.   REHAB POTENTIAL: Good  CLINICAL DECISION MAKING:  Stable/uncomplicated  EVALUATION COMPLEXITY: Low   GOALS: Goals reviewed with patient? Yes  SHORT TERM GOALS: Target date: 05/02/2023  Patient will be independent with initial HEP. Baseline:  Goal status: INITIAL  2.  Patient will be able to complete a car transfer with < or = to 2/10 knee pain. Baseline:  Goal status: INITIAL  3.  Patient will demonstrate > or = to 90 degrees of knee flexion for improved sit to stand transfer. Baseline:  Goal status: INITIAL    LONG TERM GOALS: Target date: 06/28/2023 Patient will demonstrate independence in advanced HEP. Baseline:  Goal status: INITIAL  2.  Patient will be able to ascend a curb/ step for safe community negotiation. Baseline:  Goal status: INITIAL  3.  Patient will be able to return to work and complete all work duties. Baseline:  Goal status: INITIAL  4.  Patient will score < or = to 19 sec on 5 STS for improved functional mobility. Baseline:  Goal status: INITIAL  5.  Patient will score > or = to 61 on FOTO for improved function.  Baseline:  Goal status: INITIAL  6.  Patient will be able to walk in the community and run errands with < or = to 2/10 knee pain. Baseline:  Goal status: INITIAL   PLAN:  PT FREQUENCY: 2x/week  PT DURATION: 12 weeks  PLANNED INTERVENTIONS: 97164- PT Re-evaluation, 97110-Therapeutic exercises, 97530- Therapeutic activity, 97112- Neuromuscular re-education, 97535- Self Care, 16109- Manual therapy, [phone]- Gait training, [phone]- Aquatic Therapy, 97014- Electrical stimulation (unattended), [id number]- Electrical stimulation (manual), [id number]- Vasopneumatic device, [id number]- Ultrasound, [id number]- Traction (mechanical), [id number]- Ionotophoresis 4mg /ml Dexamethasone, Patient/Family education, Balance training, Stair training, Taping, Dry Needling, Joint mobilization, Joint manipulation, Spinal manipulation, Spinal mobilization, Scar mobilization, Vestibular training, Cryotherapy, and Moist heat  PLAN FOR  NEXT SESSION: assess extension ; continue Rt knee flexion/ extension ROM and functional strengthening    Claude Manges, PT 04/11/23 5:17 PM [facility] [address], [address] [place], [place] [address] Phone # [phone] Fax [phone]

## 2023-04-13 ENCOUNTER — Other Ambulatory Visit (HOSPITAL_COMMUNITY): Payer: Self-pay

## 2023-04-16 ENCOUNTER — Other Ambulatory Visit: Payer: Self-pay | Admitting: Internal Medicine

## 2023-04-16 ENCOUNTER — Encounter: Payer: Self-pay | Admitting: Physical Therapy

## 2023-04-16 ENCOUNTER — Other Ambulatory Visit (HOSPITAL_COMMUNITY): Payer: Self-pay

## 2023-04-16 ENCOUNTER — Ambulatory Visit: Payer: Commercial Managed Care - PPO | Attending: Student | Admitting: Physical Therapy

## 2023-04-16 DIAGNOSIS — G8929 Other chronic pain: Secondary | ICD-10-CM | POA: Insufficient documentation

## 2023-04-16 DIAGNOSIS — R262 Difficulty in walking, not elsewhere classified: Secondary | ICD-10-CM | POA: Insufficient documentation

## 2023-04-16 DIAGNOSIS — M25661 Stiffness of right knee, not elsewhere classified: Secondary | ICD-10-CM | POA: Diagnosis not present

## 2023-04-16 DIAGNOSIS — R600 Localized edema: Secondary | ICD-10-CM | POA: Diagnosis not present

## 2023-04-16 DIAGNOSIS — M6281 Muscle weakness (generalized): Secondary | ICD-10-CM | POA: Insufficient documentation

## 2023-04-16 DIAGNOSIS — M25561 Pain in right knee: Secondary | ICD-10-CM | POA: Diagnosis not present

## 2023-04-16 DIAGNOSIS — R252 Cramp and spasm: Secondary | ICD-10-CM | POA: Insufficient documentation

## 2023-04-16 NOTE — Therapy (Signed)
 OUTPATIENT PHYSICAL THERAPY LOWER EXTREMITY TREATMENT   Patient Name: Katelyn Peterson MRN: [id number] DOB:04-04-1945, 78 y.o., female Today's Date: 04/16/2023  END OF SESSION:  PT End of Session - 04/16/23 1705     Visit Number 8    Date for PT Re-Evaluation 06/28/23    Authorization Type [name]    PT Start Time 1446    PT Stop Time 1543    PT Time Calculation (min) 57 min    Activity Tolerance Patient tolerated treatment well    Behavior During Therapy WFL for tasks assessed/performed                    Past Medical History:  Diagnosis Date   Anemia    Arthritis    HTN (hypertension)    Hyperlipidemia    Obesity    Type II or unspecified type diabetes mellitus without mention of complication, not stated as uncontrolled    Past Surgical History:  Procedure Laterality Date   ABDOMINAL HYSTERECTOMY  1985   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   Procedure: TOTAL KNEE ARTHROPLASTY;  Surgeon: Melodi Lerner, MD;  Location: [facility];  Service: Orthopedics;  Laterality: Right;   Patient Active Problem List   Diagnosis Date Noted   OA (osteoarthritis) of knee 03/18/2023   Primary osteoarthritis of right knee 03/18/2023   Preop exam for internal medicine 12/20/2022   Nuclear sclerotic cataract of right eye 12/07/2022   B12 deficiency 06/20/2022   Colon polyps 06/19/2022   Arthritis of left foot 04/11/2021   CRI (chronic renal insufficiency), stage 3 (moderate) (HCC) 12/15/2020   Wrist pain, left 12/10/2018   Knee pain, right 06/19/2017   Anemia 05/08/2016   Onychomycosis 06/22/2015   Arthritis of ankle 04/01/2015   Well adult exam 08/16/2014   Goiter 11/04/2012   Left ankle pain 08/22/2011   Grief 04/20/2011   Rhinitis 04/20/2011   ABDOMINAL DISTENSION 03/09/2010   KNEE PAIN 03/04/2008   LEG CRAMPS 09/03/2007   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) 04/11/2007   Obesity 04/11/2007   Dyslipidemia  associated with type 2 diabetes mellitus (HCC) 11/26/2006   Essential hypertension 11/26/2006    PCP: Garald Karlynn GAILS, MD   REFERRING PROVIDER: Reena Roxie CROME, PA  REFERRING DIAG: M17.11 (ICD-10-CM) - Unilateral primary osteoarthritis, right knee  THERAPY DIAG:  Stiffness of right knee, not elsewhere classified  Chronic pain of right knee  Difficulty in walking, not elsewhere classified  Localized edema  Rationale for Evaluation and Treatment: Rehabilitation  ONSET DATE: 03/18/2023  SUBJECTIVE:   SUBJECTIVE STATEMENT: Patient reports she is doing good today. She is not currently having any pain.  From Eval: Patient presents post Rt TKA on 1/6. She has been walking once a day for 10 minutes. She was not given any exercises. She has increased pain with functional movements (sit to stand, getting in and out of a car, etc). She does not have any pain if her Rt knee if it is extended. She wants to get back to walking normally when running errands.  PERTINENT HISTORY: HTN, Anemia; Type II diabetes   PAIN: 04/16/2023 Are you having pain? Yes: NPRS scale: 0/10 Pain location: Rt leg Pain description: achy dull Aggravating factors: getting out car; sitting down with knee flexed Relieving factors: Pain medication  PRECAUTIONS: Other: Status post Rt TKA 03/18/2023  RED FLAGS: None   WEIGHT BEARING RESTRICTIONS: No  FALLS:  Has patient fallen in last 6  months? No  LIVING ENVIRONMENT: Lives with: lives with their family Lives in: House/apartment Stairs: Yes: Internal: 1 steps; none Has following equipment at home: Vannie - 2 wheeled and shower chair  OCCUPATION: Psychologist, [employer] at [employer]  PLOF: Independent and Independent with basic ADLs  PATIENT GOALS: To get the pain gone  NEXT MD VISIT: 04/02/2023  OBJECTIVE:  Note: Objective measures were completed at Evaluation unless otherwise noted.   PATIENT SURVEYS:  FOTO 31; goal 78  COGNITION: Overall cognitive  status: Within functional limits for tasks assessed     SENSATION: Rt knee Numbness   EDEMA:  Rt knee 3 cm above patella: 54.5cm 3 cam below patella:51cm At patella : 51.5 cm   POSTURE: rounded shoulders and forward head   LOWER EXTREMITY ROM: seated in wheelchair  Active ROM Right eval 03/14/2023  04/16/2023 Left eval  Hip flexion      Hip extension      Hip abduction      Hip adduction      Hip internal rotation      Hip external rotation      Knee flexion 56 AAROM 90 (semi-reclined) PROM 95    Knee extension -2 -15 -5   Ankle dorsiflexion      Ankle plantarflexion      Ankle inversion      Ankle eversion       (Blank rows = not tested)  LOWER EXTREMITY MMT: Deferred      FUNCTIONAL TESTS:  5 times sit to stand: 24.11 with Rt knee extended & UE support 03/26/2023 TUG: 42.59 03/27/2023  with front wheeled walker 81ft GAIT: Patient did not ambulate during treatment session due to increased pain. Patient required max assistance with car transfer                                                                                                                                 TREATMENT DATE:  04/16/2023 Recumbent Bike Level 1- 7 mins PT present to discuss status (1/2 revolutions)  Hamstring stretch at 1st stair 2 x 30 sec Knee flexion stretch on 2nd  stair 3 x 20 sec Step ups at stair x 10 bilateral UE support TKE with red loop 2 x 10 Rt knee flexion with slider x 20 Sit to stand from plinth 2 x 8 Standing marching in front of walker x 10 bilateral  Semi-reclined terminal knee extension with towel under ankle x 20 Measured Rt knee extension at -5 degrees Semi-reclined knee flexion with green strap 2 x 10 Manual Joint Mobs Grade II AP for improved extension  Vasopneumatic  compression Rt knee low pressure x 15 minutes    04/11/2023 Recumbent Bike Level 1- 7 mins PT present to discuss status (1/2 revolutions)  Hamstring stretch on 6inch step 3 x 30 sec Rt   Calf stretch on rockerboard 3 x 30 sec TKE with red loop 2 x 10 Seated Rt  foot on 6 inch step + quad set 2 x 10 Rt knee flexion with slider x 20 Sit to stand (plinth + airex) x 7 Sit to stand (standard chair) x 8 Flat cone step overs with Rt x 10 (focusing on extension) with unilateral UE support Low load prolonged stretch 2 x 2 mins 5# weight on knee Vasopneumatic  compression Rt knee low pressure x 15 minutes    04/09/2023 Recumbent Bike Level 1- 6 mins PT present to discuss status (1/2 revolutions)  Hamstring stretch at 1st stair 2 x 30 sec Knee flexion stretch on 2nd  stair 3 x 20 sec Calf stretch on rockerboard 2 x 30 sec Rt knee flexion with slider x 20 Standing hip flexion in walker x 10 each Flat cone step overs with Rt x 15 with unilateral UE support at bar Mini Squat at barre x 10 M/L weight shifts; A/P weight shifts x 20 each direction Semi-reclined Rt knee flexion with gait belt 2 x 10 Semi-reclined quad set with towel under foot x 10 Measured Rt knee ROM in semiclined position : AAROM 90 degrees; PROM 95 degrees Manual Joint Mobs Grade II AP for improved extension  Vasopneumatic  compression Rt knee low pressure x 15 minutes      PATIENT EDUCATION:  Education details: HEP; POC Person educated: Patient Education method: Programmer, Multimedia, Demonstration, and Handouts Education comprehension: verbalized understanding, returned demonstration, and needs further education  HOME EXERCISE PROGRAM: Access Code: [email or web address] URL: [email or web address] Date: 04/09/2023 Prepared by: Kristeen Sar  Exercises - Seated Heel Slide  - 1 x daily - 7 x weekly - 2 sets - 10 reps - Sitting Heel Slide with Towel  - 1 x daily - 7 x weekly - 2 sets - 10 reps - Supine Quad Set  - 1 x daily - 7 x weekly - 2 sets - 10 reps - 2-3 hold - Active Straight Leg Raise with Quad Set  - 1 x daily - 7 x weekly - 2 sets - 10 reps - Seated Hamstring Stretch  - 1 x daily - 7 x weekly - 2 sets  - 10 reps - Standing March with Counter Support  - 1 x daily - 7 x weekly - 1 sets - 10 reps - Standing Hip Abduction with Counter Support  - 1 x daily - 7 x weekly - 1 sets - 10 reps - Supine Knee Extension Stretch on Towel Roll  - 1 x daily - 7 x weekly - 2 sets - 10 reps - Seated Knee Extension Stretch with Chair  - 1 x daily - 7 x weekly - 2 sets - 30 hold  ASSESSMENT:  CLINICAL IMPRESSION: Today's treatment session focused on Rt knee flexion and extension mobility. Measured patient's extension at -5 degrees which is good improvement from last treatment session. Noted decreased swelling around incision site. Patient has a follow up appointment with MD on Wednesday. Patient is progressing appropriately post TKA and is demonstrating improved functional strength. Patient will benefit from skilled PT to address the below impairments and improve overall function.      OBJECTIVE IMPAIRMENTS: Abnormal gait, decreased mobility, difficulty walking, decreased ROM, decreased strength, increased edema, increased muscle spasms, impaired flexibility, postural dysfunction, obesity, and pain.   ACTIVITY LIMITATIONS: lifting, standing, squatting, stairs, transfers, and bed mobility  PARTICIPATION LIMITATIONS: cleaning, laundry, driving, shopping, community activity, and occupation  PERSONAL FACTORS: Fitness and 1-2 comorbidities: HTN, Anemia; Type II diabetes   are also affecting patient's functional outcome.  REHAB POTENTIAL: Good  CLINICAL DECISION MAKING: Stable/uncomplicated  EVALUATION COMPLEXITY: Low   GOALS: Goals reviewed with patient? Yes  SHORT TERM GOALS: Target date: 05/02/2023  Patient will be independent with initial HEP. Baseline:  Goal status: INITIAL  2.  Patient will be able to complete a car transfer with < or = to 2/10 knee pain. Baseline:  Goal status: INITIAL  3.  Patient will demonstrate > or = to 90 degrees of knee flexion for improved sit to stand  transfer. Baseline:  Goal status: INITIAL    LONG TERM GOALS: Target date: 06/28/2023 Patient will demonstrate independence in advanced HEP. Baseline:  Goal status: INITIAL  2.  Patient will be able to ascend a curb/ step for safe community negotiation. Baseline:  Goal status: INITIAL  3.  Patient will be able to return to work and complete all work duties. Baseline:  Goal status: INITIAL  4.  Patient will score < or = to 19 sec on 5 STS for improved functional mobility. Baseline:  Goal status: INITIAL  5.  Patient will score > or = to 61 on FOTO for improved function.  Baseline:  Goal status: INITIAL  6.  Patient will be able to walk in the community and run errands with < or = to 2/10 knee pain. Baseline:  Goal status: INITIAL   PLAN:  PT FREQUENCY: 2x/week  PT DURATION: 12 weeks  PLANNED INTERVENTIONS: 97164- PT Re-evaluation, 97110-Therapeutic exercises, 97530- Therapeutic activity, 97112- Neuromuscular re-education, 97535- Self Care, 02859- Manual therapy, [phone]- Gait training, [phone]- Aquatic Therapy, 97014- Electrical stimulation (unattended), [phone]- Electrical stimulation (manual), [id number]- Vasopneumatic device, [id number]- Ultrasound, [id number]- Traction (mechanical), [id number]- Ionotophoresis 4mg /ml Dexamethasone , Patient/Family education, Balance training, Stair training, Taping, Dry Needling, Joint mobilization, Joint manipulation, Spinal manipulation, Spinal mobilization, Scar mobilization, Vestibular training, Cryotherapy, and Moist heat  PLAN FOR NEXT SESSION: ask about MD appt; continue functional strengthening   Kristeen Sar, PT 04/16/23 5:08 PM [facility] [address] [place], [place] [address] Phone # [phone] Fax [phone]

## 2023-04-17 ENCOUNTER — Other Ambulatory Visit (HOSPITAL_COMMUNITY): Payer: Self-pay

## 2023-04-17 MED ORDER — OZEMPIC (0.25 OR 0.5 MG/DOSE) 2 MG/3ML ~~LOC~~ SOPN
0.5000 mg | PEN_INJECTOR | SUBCUTANEOUS | 3 refills | Status: DC
  Filled 2023-04-17: qty 9, 84d supply, fill #0
  Filled 2023-06-27: qty 9, 84d supply, fill #1
  Filled 2023-10-14: qty 9, 84d supply, fill #2

## 2023-04-18 ENCOUNTER — Other Ambulatory Visit (HOSPITAL_COMMUNITY): Payer: Self-pay

## 2023-04-18 ENCOUNTER — Ambulatory Visit: Payer: Commercial Managed Care - PPO | Admitting: Physical Therapy

## 2023-04-18 ENCOUNTER — Encounter: Payer: Self-pay | Admitting: Physical Therapy

## 2023-04-18 DIAGNOSIS — M25661 Stiffness of right knee, not elsewhere classified: Secondary | ICD-10-CM

## 2023-04-18 DIAGNOSIS — G8929 Other chronic pain: Secondary | ICD-10-CM

## 2023-04-18 DIAGNOSIS — R262 Difficulty in walking, not elsewhere classified: Secondary | ICD-10-CM

## 2023-04-18 DIAGNOSIS — R600 Localized edema: Secondary | ICD-10-CM

## 2023-04-18 DIAGNOSIS — R252 Cramp and spasm: Secondary | ICD-10-CM | POA: Diagnosis not present

## 2023-04-18 DIAGNOSIS — M6281 Muscle weakness (generalized): Secondary | ICD-10-CM | POA: Diagnosis not present

## 2023-04-18 DIAGNOSIS — M25561 Pain in right knee: Secondary | ICD-10-CM | POA: Diagnosis not present

## 2023-04-18 NOTE — Therapy (Signed)
 OUTPATIENT PHYSICAL THERAPY LOWER EXTREMITY TREATMENT   Patient Name: Katelyn Peterson MRN: [id number] DOB:[birth year], 78 y.o., female Today's Date: 04/18/2023  END OF SESSION:  PT End of Session - 04/18/23 1628     Visit Number 9    Date for PT Re-Evaluation 06/28/23    Authorization Type [name]    PT Start Time 1444    PT Stop Time 1545    PT Time Calculation (min) 61 min    Activity Tolerance Patient tolerated treatment well    Behavior During Therapy WFL for tasks assessed/performed                     Past Medical History:  Diagnosis Date   Anemia    Arthritis    HTN (hypertension)    Hyperlipidemia    Obesity    Type II or unspecified type diabetes mellitus without mention of complication, not stated as uncontrolled    Past Surgical History:  Procedure Laterality Date   ABDOMINAL HYSTERECTOMY  1985   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   Procedure: TOTAL KNEE ARTHROPLASTY;  Surgeon: Melodi Lerner, MD;  Location: [facility];  Service: Orthopedics;  Laterality: Right;   Patient Active Problem List   Diagnosis Date Noted   OA (osteoarthritis) of knee 03/18/2023   Primary osteoarthritis of right knee 03/18/2023   Preop exam for internal medicine 12/20/2022   Nuclear sclerotic cataract of right eye 12/07/2022   B12 deficiency 06/20/2022   Colon polyps 06/19/2022   Arthritis of left foot 04/11/2021   CRI (chronic renal insufficiency), stage 3 (moderate) (HCC) 12/15/2020   Wrist pain, left 12/10/2018   Knee pain, right 06/19/2017   Anemia 05/08/2016   Onychomycosis 06/22/2015   Arthritis of ankle 04/01/2015   Well adult exam 08/16/2014   Goiter 11/04/2012   Left ankle pain 08/22/2011   Grief 04/20/2011   Rhinitis 04/20/2011   ABDOMINAL DISTENSION 03/09/2010   KNEE PAIN 03/04/2008   LEG CRAMPS 09/03/2007   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) 04/11/2007   Obesity 04/11/2007   Dyslipidemia  associated with type 2 diabetes mellitus (HCC) 11/26/2006   Essential hypertension 11/26/2006    PCP: Garald Karlynn GAILS, MD   REFERRING PROVIDER: Reena Roxie CROME, PA  REFERRING DIAG: M17.11 (ICD-10-CM) - Unilateral primary osteoarthritis, right knee  THERAPY DIAG:  Stiffness of right knee, not elsewhere classified  Chronic pain of right knee  Difficulty in walking, not elsewhere classified  Localized edema  Rationale for Evaluation and Treatment: Rehabilitation  ONSET DATE: 03/18/2023  SUBJECTIVE:   SUBJECTIVE STATEMENT: Patient reports her knee was very stiff yesterday.  From Eval: Patient presents post Rt TKA on 1/6. She has been walking once a day for 10 minutes. She was not given any exercises. She has increased pain with functional movements (sit to stand, getting in and out of a car, etc). She does not have any pain if her Rt knee if it is extended. She wants to get back to walking normally when running errands.  PERTINENT HISTORY: HTN, Anemia; Type II diabetes   PAIN: 04/18/2023 Are you having pain? Yes: NPRS scale: 0/10 Pain location: Rt leg Pain description: achy dull Aggravating factors: getting out car; sitting down with knee flexed Relieving factors: Pain medication  PRECAUTIONS: Other: Status post Rt TKA 03/18/2023  RED FLAGS: None   WEIGHT BEARING RESTRICTIONS: No  FALLS:  Has patient fallen in last 6 months? No  LIVING ENVIRONMENT:  Lives with: lives with their family Lives in: House/apartment Stairs: Yes: Internal: 1 steps; none Has following equipment at home: Vannie - 2 wheeled and shower chair  OCCUPATION: Psychologist, [employer] at [employer]  PLOF: Independent and Independent with basic ADLs  PATIENT GOALS: To get the pain gone  NEXT MD VISIT: 04/02/2023  OBJECTIVE:  Note: Objective measures were completed at Evaluation unless otherwise noted.   PATIENT SURVEYS:  FOTO 31; goal 10  COGNITION: Overall cognitive status: Within functional limits  for tasks assessed     SENSATION: Rt knee Numbness   EDEMA:  Rt knee 3 cm above patella: 54.5cm 3 cam below patella:51cm At patella : 51.5 cm   POSTURE: rounded shoulders and forward head   LOWER EXTREMITY ROM: seated in wheelchair  Active ROM Right eval 03/14/2023  04/16/2023 Left eval  Hip flexion      Hip extension      Hip abduction      Hip adduction      Hip internal rotation      Hip external rotation      Knee flexion 56 AAROM 90 (semi-reclined) PROM 95    Knee extension -2 -15 -5   Ankle dorsiflexion      Ankle plantarflexion      Ankle inversion      Ankle eversion       (Blank rows = not tested)  LOWER EXTREMITY MMT: Deferred      FUNCTIONAL TESTS:  5 times sit to stand: 24.11 with Rt knee extended & UE support 03/26/2023 TUG: 42.59 03/27/2023  with front wheeled walker 13ft  04/18/2023 3MWT:38ft  GAIT: Patient did not ambulate during treatment session due to increased pain. Patient required max assistance with car transfer                                                                                                                                 TREATMENT DATE:  04/18/2023 Recumbent Bike Level 1- 7 mins PT present to discuss status (1/2 revolutions)  Hamstring stretch at 2nd stair 3 x 30 sec Knee flexion stretch on 2nd  stair 3 x 20 sec 3MWT:363ft Rt knee flexion with slider x 20 Sit to stand from plinth 2 x 8 Hurdles (forwards & sideways) x 2 laps each direction 6 inch step ups x 4 on Rt (increased challenge patient unable to stay erect) 4 inch step ups x 10 on Rt  Seated LAQ 2 x 10 in Rt  Standing heel raises 2 x 10 Vasopneumatic compression Rt knee moderate pressure x 15 minutes    04/16/2023 Recumbent Bike Level 1- 7 mins PT present to discuss status (1/2 revolutions)  Hamstring stretch at 1st stair 2 x 30 sec Knee flexion stretch on 2nd  stair 3 x 20 sec Step ups at stair x 10 bilateral UE support TKE with red loop 2 x 10 Rt  knee flexion with slider x 20 Sit  to stand from plinth 2 x 8 Standing marching in front of walker x 10 bilateral  Semi-reclined terminal knee extension with towel under ankle x 20 Measured Rt knee extension at -5 degrees Semi-reclined knee flexion with green strap 2 x 10 Manual Joint Mobs Grade II AP for improved extension  Vasopneumatic  compression Rt knee low pressure x 15 minutes    04/11/2023 Recumbent Bike Level 1- 7 mins PT present to discuss status (1/2 revolutions)  Hamstring stretch on 6inch step 3 x 30 sec Rt  Calf stretch on rockerboard 3 x 30 sec TKE with red loop 2 x 10 Seated Rt foot on 6 inch step + quad set 2 x 10 Rt knee flexion with slider x 20 Sit to stand (plinth + airex) x 7 Sit to stand (standard chair) x 8 Flat cone step overs with Rt x 10 (focusing on extension) with unilateral UE support Low load prolonged stretch 2 x 2 mins 5# weight on knee Vasopneumatic  compression Rt knee low pressure x 15 minutes    04/09/2023 Recumbent Bike Level 1- 6 mins PT present to discuss status (1/2 revolutions)  Hamstring stretch at 1st stair 2 x 30 sec Knee flexion stretch on 2nd  stair 3 x 20 sec Calf stretch on rockerboard 2 x 30 sec Rt knee flexion with slider x 20 Standing hip flexion in walker x 10 each Flat cone step overs with Rt x 15 with unilateral UE support at bar Mini Squat at barre x 10 M/L weight shifts; A/P weight shifts x 20 each direction Semi-reclined Rt knee flexion with gait belt 2 x 10 Semi-reclined quad set with towel under foot x 10 Measured Rt knee ROM in semiclined position : AAROM 90 degrees; PROM 95 degrees Manual Joint Mobs Grade II AP for improved extension  Vasopneumatic  compression Rt knee low pressure x 15 minutes      PATIENT EDUCATION:  Education details: HEP; POC Person educated: Patient Education method: Programmer, Multimedia, Demonstration, and Handouts Education comprehension: verbalized understanding, returned demonstration, and  needs further education  HOME EXERCISE PROGRAM: Access Code: [email or web address] URL: [email or web address] Date: 04/09/2023 Prepared by: Kristeen Sar  Exercises - Seated Heel Slide  - 1 x daily - 7 x weekly - 2 sets - 10 reps - Sitting Heel Slide with Towel  - 1 x daily - 7 x weekly - 2 sets - 10 reps - Supine Quad Set  - 1 x daily - 7 x weekly - 2 sets - 10 reps - 2-3 hold - Active Straight Leg Raise with Quad Set  - 1 x daily - 7 x weekly - 2 sets - 10 reps - Seated Hamstring Stretch  - 1 x daily - 7 x weekly - 2 sets - 10 reps - Standing March with Counter Support  - 1 x daily - 7 x weekly - 1 sets - 10 reps - Standing Hip Abduction with Counter Support  - 1 x daily - 7 x weekly - 1 sets - 10 reps - Supine Knee Extension Stretch on Towel Roll  - 1 x daily - 7 x weekly - 2 sets - 10 reps - Seated Knee Extension Stretch with Chair  - 1 x daily - 7 x weekly - 2 sets - 30 hold  ASSESSMENT:  CLINICAL IMPRESSION: Today's treatment session focused on Rt knee flexion and extension mobility. Re administered 3 MWT and Aubrie walked 250ft more than last time test was administered. She tolerated treatment session well.  Ascending 6 inch step was too challenging for patient she compensated with trunk flexion. 4inch step was more appropriate for patient. Patient will benefit from skilled PT to address the below impairments and improve overall function.      OBJECTIVE IMPAIRMENTS: Abnormal gait, decreased mobility, difficulty walking, decreased ROM, decreased strength, increased edema, increased muscle spasms, impaired flexibility, postural dysfunction, obesity, and pain.   ACTIVITY LIMITATIONS: lifting, standing, squatting, stairs, transfers, and bed mobility  PARTICIPATION LIMITATIONS: cleaning, laundry, driving, shopping, community activity, and occupation  PERSONAL FACTORS: Fitness and 1-2 comorbidities: HTN, Anemia; Type II diabetes   are also affecting patient's functional outcome.    REHAB POTENTIAL: Good  CLINICAL DECISION MAKING: Stable/uncomplicated  EVALUATION COMPLEXITY: Low   GOALS: Goals reviewed with patient? Yes  SHORT TERM GOALS: Target date: 05/02/2023  Patient will be independent with initial HEP. Baseline:  Goal status: MET 04/18/2023  2.  Patient will be able to complete a car transfer with < or = to 2/10 knee pain. Baseline:  Goal status: INITIAL  3.  Patient will demonstrate > or = to 90 degrees of knee flexion for improved sit to stand transfer. Baseline:  Goal status: MET 04/18/2023    LONG TERM GOALS: Target date: 06/28/2023 Patient will demonstrate independence in advanced HEP. Baseline:  Goal status: INITIAL  2.  Patient will be able to ascend a curb/ step for safe community negotiation. Baseline:  Goal status: INITIAL  3.  Patient will be able to return to work and complete all work duties. Baseline:  Goal status: INITIAL  4.  Patient will score < or = to 19 sec on 5 STS for improved functional mobility. Baseline:  Goal status: INITIAL  5.  Patient will score > or = to 61 on FOTO for improved function.  Baseline:  Goal status: INITIAL  6.  Patient will be able to walk in the community and run errands with < or = to 2/10 knee pain. Baseline:  Goal status: INITIAL   PLAN:  PT FREQUENCY: 2x/week  PT DURATION: 12 weeks  PLANNED INTERVENTIONS: 97164- PT Re-evaluation, 97110-Therapeutic exercises, 97530- Therapeutic activity, 97112- Neuromuscular re-education, 97535- Self Care, 02859- Manual therapy, [phone]- Gait training, [phone]- Aquatic Therapy, 97014- Electrical stimulation (unattended), [phone]- Electrical stimulation (manual), [id number]- Vasopneumatic device, [id number]- Ultrasound, [id number]- Traction (mechanical), [id number]- Ionotophoresis 4mg /ml Dexamethasone , Patient/Family education, Balance training, Stair training, Taping, Dry Needling, Joint mobilization, Joint manipulation, Spinal manipulation, Spinal mobilization, Scar  mobilization, Vestibular training, Cryotherapy, and Moist heat  PLAN FOR NEXT SESSION: ask about MD appt (2/12/); continue functional strengthening & Rt knee flexion mobility  Kristeen Sar, PT 04/18/23 4:29 PM [facility] [address] [place], [place] [address] Phone # [phone] Fax [phone]

## 2023-04-23 ENCOUNTER — Encounter: Payer: Self-pay | Admitting: Physical Therapy

## 2023-04-23 ENCOUNTER — Ambulatory Visit: Payer: Commercial Managed Care - PPO | Admitting: Physical Therapy

## 2023-04-23 DIAGNOSIS — G8929 Other chronic pain: Secondary | ICD-10-CM

## 2023-04-23 DIAGNOSIS — M25561 Pain in right knee: Secondary | ICD-10-CM | POA: Diagnosis not present

## 2023-04-23 DIAGNOSIS — R262 Difficulty in walking, not elsewhere classified: Secondary | ICD-10-CM | POA: Diagnosis not present

## 2023-04-23 DIAGNOSIS — M25661 Stiffness of right knee, not elsewhere classified: Secondary | ICD-10-CM | POA: Diagnosis not present

## 2023-04-23 DIAGNOSIS — R600 Localized edema: Secondary | ICD-10-CM | POA: Diagnosis not present

## 2023-04-23 DIAGNOSIS — R252 Cramp and spasm: Secondary | ICD-10-CM | POA: Diagnosis not present

## 2023-04-23 DIAGNOSIS — M6281 Muscle weakness (generalized): Secondary | ICD-10-CM | POA: Diagnosis not present

## 2023-04-23 NOTE — Therapy (Signed)
OUTPATIENT PHYSICAL THERAPY LOWER EXTREMITY TREATMENT   Patient Name: Katelyn Peterson MRN: [id number] DOB:23-Jun-1945, 78 y.o., female Today's Date: 04/23/2023  END OF SESSION:  PT End of Session - 04/23/23 1558     Visit Number 10    Date for PT Re-Evaluation 06/28/23    Authorization Type [facility] [employer]    PT Start Time 1445    PT Stop Time 1546    PT Time Calculation (min) 61 min    Activity Tolerance Patient tolerated treatment well    Behavior During Therapy WFL for tasks assessed/performed                      Past Medical History:  Diagnosis Date   Anemia    Arthritis    HTN (hypertension)    Hyperlipidemia    Obesity    Type II or unspecified type diabetes mellitus without mention of complication, not stated as uncontrolled    Past Surgical History:  Procedure Laterality Date   ABDOMINAL HYSTERECTOMY  1985   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   Procedure: TOTAL KNEE ARTHROPLASTY;  Surgeon: Ollen Gross, MD;  Location: [facility];  Service: Orthopedics;  Laterality: Right;   Patient Active Problem List   Diagnosis Date Noted   OA (osteoarthritis) of knee 03/18/2023   Primary osteoarthritis of right knee 03/18/2023   Preop exam for internal medicine 12/20/2022   Nuclear sclerotic cataract of right eye 12/07/2022   B12 deficiency 06/20/2022   Colon polyps 06/19/2022   Arthritis of left foot 04/11/2021   CRI (chronic renal insufficiency), stage 3 (moderate) (HCC) 12/15/2020   Wrist pain, left 12/10/2018   Knee pain, right 06/19/2017   Anemia 05/08/2016   Onychomycosis 06/22/2015   Arthritis of ankle 04/01/2015   Well adult exam 08/16/2014   Goiter 11/04/2012   Left ankle pain 08/22/2011   Grief 04/20/2011   Rhinitis 04/20/2011   ABDOMINAL DISTENSION 03/09/2010   KNEE PAIN 03/04/2008   LEG CRAMPS 09/03/2007   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) 04/11/2007   Obesity 04/11/2007    Dyslipidemia associated with type 2 diabetes mellitus (HCC) 11/26/2006   Essential hypertension 11/26/2006    PCP: Tresa Garter, MD   REFERRING PROVIDER: Derenda Fennel, PA  REFERRING DIAG: M17.11 (ICD-10-CM) - Unilateral primary osteoarthritis, right knee  THERAPY DIAG:  Stiffness of right knee, not elsewhere classified  Chronic pain of right knee  Difficulty in walking, not elsewhere classified  Localized edema  Rationale for Evaluation and Treatment: Rehabilitation  ONSET DATE: 03/18/2023  SUBJECTIVE:   SUBJECTIVE STATEMENT: Patient reports reports she is doing good today. No new compliants.  From Eval: Patient presents post Rt TKA on 1/6. She has been walking once a day for 10 minutes. She was not given any exercises. She has increased pain with functional movements (sit to stand, getting in and out of a car, etc). She does not have any pain if her Rt knee if it is extended. She wants to get back to walking normally when running errands.  PERTINENT HISTORY: HTN, Anemia; Type II diabetes   PAIN: 04/23/2023 Are you having pain? Yes: NPRS scale: 0/10 Pain location: Rt leg Pain description: achy dull Aggravating factors: getting out car; sitting down with knee flexed Relieving factors: Pain medication  PRECAUTIONS: Other: Status post Rt TKA 03/18/2023  RED FLAGS: None   WEIGHT BEARING RESTRICTIONS: No  FALLS:  Has patient fallen in last 6 months?  No  LIVING ENVIRONMENT: Lives with: lives with their family Lives in: House/apartment Stairs: Yes: Internal: 1 steps; none Has following equipment at home: Dan Humphreys - 2 wheeled and shower chair  OCCUPATION: Psychologist, sport and exercise at [employer]  PLOF: Independent and Independent with basic ADLs  PATIENT GOALS: To get the pain gone  NEXT MD VISIT: 04/02/2023  OBJECTIVE:  Note: Objective measures were completed at Evaluation unless otherwise noted.   PATIENT SURVEYS:  FOTO 31; goal 79  COGNITION: Overall cognitive  status: Within functional limits for tasks assessed     SENSATION: Rt knee Numbness   EDEMA:  Rt knee 3 cm above patella: 54.5cm 3 cam below patella:51cm At patella : 51.5 cm   POSTURE: rounded shoulders and forward head   LOWER EXTREMITY ROM: seated in wheelchair  Active ROM Right eval 03/14/2023  04/16/2023 Left eval  Hip flexion      Hip extension      Hip abduction      Hip adduction      Hip internal rotation      Hip external rotation      Knee flexion 56 AAROM 90 (semi-reclined) PROM 95    Knee extension -2 -15 -5   Ankle dorsiflexion      Ankle plantarflexion      Ankle inversion      Ankle eversion       (Blank rows = not tested)  LOWER EXTREMITY MMT: Deferred      FUNCTIONAL TESTS:  5 times sit to stand: 24.11 with Rt knee extended & UE support 03/26/2023 TUG: 42.59 03/27/2023  with front wheeled walker 60ft  04/18/2023 3MWT:318ft  GAIT: Patient did not ambulate during treatment session due to increased pain. Patient required max assistance with car transfer                                                                                                                                 TREATMENT DATE:  04/23/2023 Recumbent Bike Level 1- 7 mins PT present to discuss status (1/2 revolutions)  Hamstring stretch at 2nd stair 2 x 30 sec Knee flexion stretch on 2nd  stair 3 x 20 sec TKE with small blue ball x 20 Squats at barre 2 x 10 Hurdles (forwards & sideways) x 2 laps each direction 4 inch step ups x 10 on Rt  Hurdles (forwards & sideways) x 2 laps each direction Standing heel raises 2 x 10 Rt knee flexion with slider x 20 Seated knee flexion with slide + green  2 x 10 on Rt  Seated LAQ 2 x 10 in Rt  SLR 2 x 8 Manual : Rt knee joint flexion PROM Vasopneumatic compression Rt knee moderate pressure x 15 minutes    04/18/2023 Recumbent Bike Level 1- 7 mins PT present to discuss status (1/2 revolutions)  Hamstring stretch at 2nd stair 3 x 30  sec Knee flexion stretch on 2nd  stair 3  x 20 sec 3MWT:338ft Rt knee flexion with slider x 20 Sit to stand from plinth 2 x 8 Hurdles (forwards & sideways) x 2 laps each direction 6 inch step ups x 4 on Rt (increased challenge patient unable to stay erect) 4 inch step ups x 10 on Rt  Seated LAQ 2 x 10 in Rt  Standing heel raises 2 x 10 Vasopneumatic compression Rt knee moderate pressure x 15 minutes    04/16/2023 Recumbent Bike Level 1- 7 mins PT present to discuss status (1/2 revolutions)  Hamstring stretch at 1st stair 2 x 30 sec Knee flexion stretch on 2nd  stair 3 x 20 sec Step ups at stair x 10 bilateral UE support TKE with red loop 2 x 10 Rt knee flexion with slider x 20 Sit to stand from plinth 2 x 8 Standing marching in front of walker x 10 bilateral  Semi-reclined terminal knee extension with towel under ankle x 20 Measured Rt knee extension at -5 degrees Semi-reclined knee flexion with green strap 2 x 10 Manual Joint Mobs Grade II AP for improved extension  Vasopneumatic  compression Rt knee low pressure x 15 minutes       PATIENT EDUCATION:  Education details: HEP; POC Person educated: Patient Education method: Programmer, multimedia, Demonstration, and Handouts Education comprehension: verbalized understanding, returned demonstration, and needs further education  HOME EXERCISE PROGRAM: Access Code: [email or web address] URL: [email or web address] Date: 04/09/2023 Prepared by: Claude Manges  Exercises - Seated Heel Slide  - 1 x daily - 7 x weekly - 2 sets - 10 reps - Sitting Heel Slide with Towel  - 1 x daily - 7 x weekly - 2 sets - 10 reps - Supine Quad Set  - 1 x daily - 7 x weekly - 2 sets - 10 reps - 2-3 hold - Active Straight Leg Raise with Quad Set  - 1 x daily - 7 x weekly - 2 sets - 10 reps - Seated Hamstring Stretch  - 1 x daily - 7 x weekly - 2 sets - 10 reps - Standing March with Counter Support  - 1 x daily - 7 x weekly - 1 sets - 10 reps - Standing Hip  Abduction with Counter Support  - 1 x daily - 7 x weekly - 1 sets - 10 reps - Supine Knee Extension Stretch on Towel Roll  - 1 x daily - 7 x weekly - 2 sets - 10 reps - Seated Knee Extension Stretch with Chair  - 1 x daily - 7 x weekly - 2 sets - 30 hold  ASSESSMENT:  CLINICAL IMPRESSION: Today's treatment session focused on functional strengthening and Rt knee mobility. [place] has been compliant with HEP. Noted improved functional strength with performing 4 inch step ups and hurdles negotiation. With seated LAQ patient required verbal cues to not compensate with trunk extension to compensation for quad weakness. Overall patient is progressing well. Patient will benefit from skilled PT to address the below impairments and improve overall function.       OBJECTIVE IMPAIRMENTS: Abnormal gait, decreased mobility, difficulty walking, decreased ROM, decreased strength, increased edema, increased muscle spasms, impaired flexibility, postural dysfunction, obesity, and pain.   ACTIVITY LIMITATIONS: lifting, standing, squatting, stairs, transfers, and bed mobility  PARTICIPATION LIMITATIONS: cleaning, laundry, driving, shopping, community activity, and occupation  PERSONAL FACTORS: Fitness and 1-2 comorbidities: HTN, Anemia; Type II diabetes   are also affecting patient's functional outcome.   REHAB POTENTIAL: Good  CLINICAL DECISION MAKING: Stable/uncomplicated  EVALUATION  COMPLEXITY: Low   GOALS: Goals reviewed with patient? Yes  SHORT TERM GOALS: Target date: 05/02/2023  Patient will be independent with initial HEP. Baseline:  Goal status: MET 04/18/2023  2.  Patient will be able to complete a car transfer with < or = to 2/10 knee pain. Baseline:  Goal status: INITIAL  3.  Patient will demonstrate > or = to 90 degrees of knee flexion for improved sit to stand transfer. Baseline:  Goal status: MET 04/18/2023    LONG TERM GOALS: Target date: 06/28/2023 Patient will demonstrate  independence in advanced HEP. Baseline:  Goal status: INITIAL  2.  Patient will be able to ascend a curb/ step for safe community negotiation. Baseline:  Goal status: INITIAL  3.  Patient will be able to return to work and complete all work duties. Baseline:  Goal status: INITIAL  4.  Patient will score < or = to 19 sec on 5 STS for improved functional mobility. Baseline:  Goal status: INITIAL  5.  Patient will score > or = to 61 on FOTO for improved function.  Baseline:  Goal status: INITIAL  6.  Patient will be able to walk in the community and run errands with < or = to 2/10 knee pain. Baseline:  Goal status: INITIAL   PLAN:  PT FREQUENCY: 2x/week  PT DURATION: 12 weeks  PLANNED INTERVENTIONS: 97164- PT Re-evaluation, 97110-Therapeutic exercises, 97530- Therapeutic activity, 97112- Neuromuscular re-education, 97535- Self Care, 96045- Manual therapy, [phone]- Gait training, [phone]- Aquatic Therapy, 97014- Electrical stimulation (unattended), [phone]- Electrical stimulation (manual), [id number]- Vasopneumatic device, [id number]- Ultrasound, [id number]- Traction (mechanical), [id number]- Ionotophoresis 4mg /ml Dexamethasone, Patient/Family education, Balance training, Stair training, Taping, Dry Needling, Joint mobilization, Joint manipulation, Spinal manipulation, Spinal mobilization, Scar mobilization, Vestibular training, Cryotherapy, and Moist heat  PLAN FOR NEXT SESSION: ask about MD appt (2/12/); continue functional strengthening & Rt knee flexion mobility  Claude Manges, PT 04/23/23 4:00 PM [facility] [address] [place], [place] [address] Phone # [phone] Fax [phone]

## 2023-04-24 ENCOUNTER — Other Ambulatory Visit (HOSPITAL_COMMUNITY): Payer: Self-pay

## 2023-04-24 DIAGNOSIS — Z5189 Encounter for other specified aftercare: Secondary | ICD-10-CM | POA: Diagnosis not present

## 2023-04-25 ENCOUNTER — Encounter: Payer: Self-pay | Admitting: Physical Therapy

## 2023-04-25 ENCOUNTER — Ambulatory Visit: Payer: Commercial Managed Care - PPO | Admitting: Physical Therapy

## 2023-04-25 ENCOUNTER — Other Ambulatory Visit (HOSPITAL_COMMUNITY): Payer: Self-pay

## 2023-04-25 DIAGNOSIS — M6281 Muscle weakness (generalized): Secondary | ICD-10-CM | POA: Diagnosis not present

## 2023-04-25 DIAGNOSIS — M25661 Stiffness of right knee, not elsewhere classified: Secondary | ICD-10-CM | POA: Diagnosis not present

## 2023-04-25 DIAGNOSIS — R600 Localized edema: Secondary | ICD-10-CM | POA: Diagnosis not present

## 2023-04-25 DIAGNOSIS — G8929 Other chronic pain: Secondary | ICD-10-CM | POA: Diagnosis not present

## 2023-04-25 DIAGNOSIS — R252 Cramp and spasm: Secondary | ICD-10-CM | POA: Diagnosis not present

## 2023-04-25 DIAGNOSIS — M25561 Pain in right knee: Secondary | ICD-10-CM | POA: Diagnosis not present

## 2023-04-25 DIAGNOSIS — R262 Difficulty in walking, not elsewhere classified: Secondary | ICD-10-CM

## 2023-04-25 MED ORDER — METHOCARBAMOL 500 MG PO TABS
500.0000 mg | ORAL_TABLET | Freq: Four times a day (QID) | ORAL | 0 refills | Status: DC | PRN
  Filled 2023-04-25: qty 40, 10d supply, fill #0

## 2023-04-25 NOTE — Therapy (Signed)
OUTPATIENT PHYSICAL THERAPY LOWER EXTREMITY TREATMENT   Patient Name: Katelyn Peterson MRN: [id number] DOB:Aug 03, 1945, 78 y.o., female Today's Date: 04/25/2023  END OF SESSION:  PT End of Session - 04/25/23 1411     Visit Number 11    Date for PT Re-Evaluation 06/28/23    Authorization Type [facility] [employer]    PT Start Time 1231    PT Stop Time 1324    PT Time Calculation (min) 53 min    Activity Tolerance Patient tolerated treatment well    Behavior During Therapy WFL for tasks assessed/performed                       Past Medical History:  Diagnosis Date   Anemia    Arthritis    HTN (hypertension)    Hyperlipidemia    Obesity    Type II or unspecified type diabetes mellitus without mention of complication, not stated as uncontrolled    Past Surgical History:  Procedure Laterality Date   ABDOMINAL HYSTERECTOMY  1985   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   Procedure: TOTAL KNEE ARTHROPLASTY;  Surgeon: Ollen Gross, MD;  Location: [facility];  Service: Orthopedics;  Laterality: Right;   Patient Active Problem List   Diagnosis Date Noted   OA (osteoarthritis) of knee 03/18/2023   Primary osteoarthritis of right knee 03/18/2023   Preop exam for internal medicine 12/20/2022   Nuclear sclerotic cataract of right eye 12/07/2022   B12 deficiency 06/20/2022   Colon polyps 06/19/2022   Arthritis of left foot 04/11/2021   CRI (chronic renal insufficiency), stage 3 (moderate) (HCC) 12/15/2020   Wrist pain, left 12/10/2018   Knee pain, right 06/19/2017   Anemia 05/08/2016   Onychomycosis 06/22/2015   Arthritis of ankle 04/01/2015   Well adult exam 08/16/2014   Goiter 11/04/2012   Left ankle pain 08/22/2011   Grief 04/20/2011   Rhinitis 04/20/2011   ABDOMINAL DISTENSION 03/09/2010   KNEE PAIN 03/04/2008   LEG CRAMPS 09/03/2007   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) 04/11/2007   Obesity 04/11/2007    Dyslipidemia associated with type 2 diabetes mellitus (HCC) 11/26/2006   Essential hypertension 11/26/2006    PCP: Tresa Garter, MD   REFERRING PROVIDER: Derenda Fennel, PA  REFERRING DIAG: M17.11 (ICD-10-CM) - Unilateral primary osteoarthritis, right knee  THERAPY DIAG:  Stiffness of right knee, not elsewhere classified  Chronic pain of right knee  Difficulty in walking, not elsewhere classified  Localized edema  Rationale for Evaluation and Treatment: Rehabilitation  ONSET DATE: 03/18/2023  SUBJECTIVE:   SUBJECTIVE STATEMENT: Patient reports reports she is doing good today. Her follow up appointment with her surgeon went well. She can transition to a cane now. She has been having increased stiffness at night.  From Eval: Patient presents post Rt TKA on 1/6. She has been walking once a day for 10 minutes. She was not given any exercises. She has increased pain with functional movements (sit to stand, getting in and out of a car, etc). She does not have any pain if her Rt knee if it is extended. She wants to get back to walking normally when running errands.  PERTINENT HISTORY: HTN, Anemia; Type II diabetes   PAIN: 04/25/2023 Are you having pain? Yes: NPRS scale: 0/10 Pain location: Rt leg Pain description: achy dull Aggravating factors: getting out car; sitting down with knee flexed Relieving factors: Pain medication  PRECAUTIONS: Other: Status post Rt  TKA 03/18/2023  RED FLAGS: None   WEIGHT BEARING RESTRICTIONS: No  FALLS:  Has patient fallen in last 6 months? No  LIVING ENVIRONMENT: Lives with: lives with their family Lives in: House/apartment Stairs: Yes: Internal: 1 steps; none Has following equipment at home: Dan Humphreys - 2 wheeled and shower chair  OCCUPATION: Psychologist, sport and exercise at [employer]  PLOF: Independent and Independent with basic ADLs  PATIENT GOALS: To get the pain gone  NEXT MD VISIT: 04/02/2023  OBJECTIVE:  Note: Objective measures were  completed at Evaluation unless otherwise noted.   PATIENT SURVEYS:  FOTO 31; goal 25  COGNITION: Overall cognitive status: Within functional limits for tasks assessed     SENSATION: Rt knee Numbness   EDEMA:  Rt knee 3 cm above patella: 54.5cm 3 cam below patella:51cm At patella : 51.5 cm   POSTURE: rounded shoulders and forward head   LOWER EXTREMITY ROM: seated in wheelchair  Active ROM Right eval 03/14/2023  04/16/2023 Left eval  Hip flexion      Hip extension      Hip abduction      Hip adduction      Hip internal rotation      Hip external rotation      Knee flexion 56 AAROM 90 (semi-reclined) PROM 95    Knee extension -2 -15 -5   Ankle dorsiflexion      Ankle plantarflexion      Ankle inversion      Ankle eversion       (Blank rows = not tested)  LOWER EXTREMITY MMT: Deferred      FUNCTIONAL TESTS:  5 times sit to stand: 24.11 with Rt knee extended & UE support 03/26/2023 TUG: 42.59 03/27/2023  with front wheeled walker 41ft  04/18/2023 3MWT:357ft  GAIT: Patient did not ambulate during treatment session due to increased pain. Patient required max assistance with car transfer                                                                                                                                 TREATMENT DATE:  04/25/2023 Recumbent Bike Level 1- 7 mins PT present to discuss status (1/2 revolutions)  Sit to stand from plinth x 5 Sit to stand form plinth with 5# DB x 10 Seated LAQ 2# 2 x 10 on Rt  Standing march 2# in front of walker x 20  Hurdles (forwards & sideways) x 3 laps each direction Standing hip abduction at counter 2 x 10 bilateral  Standing heel raises 2 x 10 4inch step ups x 12  6 inch step ups x 8 Supine SLR 2 x8 Supine SAQ 2 x 10 Vasopneumatic compression Rt knee moderate pressure x 15 minutes      04/23/2023 Recumbent Bike Level 1- 7 mins PT present to discuss status (1/2 revolutions)  Hamstring stretch at 2nd  stair 2 x 30 sec Knee flexion stretch on 2nd  stair 3  x 20 sec TKE with small blue ball x 20 Squats at barre 2 x 10 Hurdles (forwards & sideways) x 2 laps each direction 4 inch step ups x 10 on Rt  Hurdles (forwards & sideways) x 2 laps each direction Standing heel raises 2 x 10 Rt knee flexion with slider x 20 Seated knee flexion with slide + green  2 x 10 on Rt  Seated LAQ 2 x 10 in Rt  SLR 2 x 8 Manual : Rt knee joint flexion PROM Vasopneumatic compression Rt knee moderate pressure x 15 minutes    04/18/2023 Recumbent Bike Level 1- 7 mins PT present to discuss status (1/2 revolutions)  Hamstring stretch at 2nd stair 3 x 30 sec Knee flexion stretch on 2nd  stair 3 x 20 sec 3MWT:351ft Rt knee flexion with slider x 20 Sit to stand from plinth 2 x 8 Hurdles (forwards & sideways) x 2 laps each direction 6 inch step ups x 4 on Rt (increased challenge patient unable to stay erect) 4 inch step ups x 10 on Rt  Seated LAQ 2 x 10 in Rt  Standing heel raises 2 x 10 Vasopneumatic compression Rt knee moderate pressure x 15 minutes     PATIENT EDUCATION:  Education details: HEP; POC Person educated: Patient Education method: Programmer, multimedia, Demonstration, and Handouts Education comprehension: verbalized understanding, returned demonstration, and needs further education  HOME EXERCISE PROGRAM: Access Code: [email or web address] URL: [email or web address] Date: 04/09/2023 Prepared by: Claude Manges  Exercises - Seated Heel Slide  - 1 x daily - 7 x weekly - 2 sets - 10 reps - Sitting Heel Slide with Towel  - 1 x daily - 7 x weekly - 2 sets - 10 reps - Supine Quad Set  - 1 x daily - 7 x weekly - 2 sets - 10 reps - 2-3 hold - Active Straight Leg Raise with Quad Set  - 1 x daily - 7 x weekly - 2 sets - 10 reps - Seated Hamstring Stretch  - 1 x daily - 7 x weekly - 2 sets - 10 reps - Standing March with Counter Support  - 1 x daily - 7 x weekly - 1 sets - 10 reps - Standing Hip Abduction  with Counter Support  - 1 x daily - 7 x weekly - 1 sets - 10 reps - Supine Knee Extension Stretch on Towel Roll  - 1 x daily - 7 x weekly - 2 sets - 10 reps - Seated Knee Extension Stretch with Chair  - 1 x daily - 7 x weekly - 2 sets - 30 hold  ASSESSMENT:  CLINICAL IMPRESSION: Today's treatment session focused on functional strengthening and Rt knee mobility. Kimiya has been making good progress post total knee replacement. Progress patient's step up to a 6 inch step and she was able to complete exercises with minimal compensations with trunk flexion. She tolerated treatment session well. Patient required verbal and visual cues for correct exercise performance. Patient will benefit from skilled PT to address the below impairments and improve overall function..      OBJECTIVE IMPAIRMENTS: Abnormal gait, decreased mobility, difficulty walking, decreased ROM, decreased strength, increased edema, increased muscle spasms, impaired flexibility, postural dysfunction, obesity, and pain.   ACTIVITY LIMITATIONS: lifting, standing, squatting, stairs, transfers, and bed mobility  PARTICIPATION LIMITATIONS: cleaning, laundry, driving, shopping, community activity, and occupation  PERSONAL FACTORS: Fitness and 1-2 comorbidities: HTN, Anemia; Type II diabetes   are also affecting patient's functional outcome.  REHAB POTENTIAL: Good  CLINICAL DECISION MAKING: Stable/uncomplicated  EVALUATION COMPLEXITY: Low   GOALS: Goals reviewed with patient? Yes  SHORT TERM GOALS: Target date: 05/02/2023  Patient will be independent with initial HEP. Baseline:  Goal status: MET 04/18/2023  2.  Patient will be able to complete a car transfer with < or = to 2/10 knee pain. Baseline:  Goal status: INITIAL  3.  Patient will demonstrate > or = to 90 degrees of knee flexion for improved sit to stand transfer. Baseline:  Goal status: MET 04/18/2023    LONG TERM GOALS: Target date: 06/28/2023 Patient will  demonstrate independence in advanced HEP. Baseline:  Goal status: INITIAL  2.  Patient will be able to ascend a curb/ step for safe community negotiation. Baseline:  Goal status: INITIAL  3.  Patient will be able to return to work and complete all work duties. Baseline:  Goal status: INITIAL  4.  Patient will score < or = to 19 sec on 5 STS for improved functional mobility. Baseline:  Goal status: INITIAL  5.  Patient will score > or = to 61 on FOTO for improved function.  Baseline:  Goal status: INITIAL  6.  Patient will be able to walk in the community and run errands with < or = to 2/10 knee pain. Baseline:  Goal status: INITIAL   PLAN:  PT FREQUENCY: 2x/week  PT DURATION: 12 weeks  PLANNED INTERVENTIONS: 97164- PT Re-evaluation, 97110-Therapeutic exercises, 97530- Therapeutic activity, 97112- Neuromuscular re-education, 97535- Self Care, 69629- Manual therapy, [phone]- Gait training, [phone]- Aquatic Therapy, 97014- Electrical stimulation (unattended), [phone]- Electrical stimulation (manual), [id number]- Vasopneumatic device, [id number]- Ultrasound, [id number]- Traction (mechanical), [id number]- Ionotophoresis 4mg /ml Dexamethasone, Patient/Family education, Balance training, Stair training, Taping, Dry Needling, Joint mobilization, Joint manipulation, Spinal manipulation, Spinal mobilization, Scar mobilization, Vestibular training, Cryotherapy, and Moist heat  PLAN FOR NEXT SESSION: gait training with straight cane;  continue functional strengthening & Rt knee flexion mobility  Claude Manges, PT 04/25/23 2:12 PM [facility] [address], [address] [place], [place] [address] Phone # [phone] Fax [phone]

## 2023-04-30 ENCOUNTER — Ambulatory Visit: Payer: Commercial Managed Care - PPO | Admitting: Physical Therapy

## 2023-04-30 ENCOUNTER — Encounter: Payer: Self-pay | Admitting: Physical Therapy

## 2023-04-30 ENCOUNTER — Other Ambulatory Visit (HOSPITAL_COMMUNITY): Payer: Self-pay

## 2023-04-30 DIAGNOSIS — M6281 Muscle weakness (generalized): Secondary | ICD-10-CM | POA: Diagnosis not present

## 2023-04-30 DIAGNOSIS — R262 Difficulty in walking, not elsewhere classified: Secondary | ICD-10-CM | POA: Diagnosis not present

## 2023-04-30 DIAGNOSIS — M25661 Stiffness of right knee, not elsewhere classified: Secondary | ICD-10-CM

## 2023-04-30 DIAGNOSIS — R600 Localized edema: Secondary | ICD-10-CM

## 2023-04-30 DIAGNOSIS — R252 Cramp and spasm: Secondary | ICD-10-CM | POA: Diagnosis not present

## 2023-04-30 DIAGNOSIS — M25561 Pain in right knee: Secondary | ICD-10-CM | POA: Diagnosis not present

## 2023-04-30 DIAGNOSIS — G8929 Other chronic pain: Secondary | ICD-10-CM | POA: Diagnosis not present

## 2023-04-30 NOTE — Therapy (Addendum)
OUTPATIENT PHYSICAL THERAPY LOWER EXTREMITY TREATMENT   Patient Name: Katelyn Peterson MRN: [id number] DOB:01-15-1946, 78 y.o., female Today's Date: 04/30/2023  END OF SESSION:  PT End of Session - 04/30/23 1533     Visit Number 12    Date for PT Re-Evaluation 06/28/23    Authorization Type [facility] [employer]    PT Start Time 1447    PT Stop Time 1554    PT Time Calculation (min) 67 min    Activity Tolerance Patient tolerated treatment well    Behavior During Therapy WFL for tasks assessed/performed                        Past Medical History:  Diagnosis Date   Anemia    Arthritis    HTN (hypertension)    Hyperlipidemia    Obesity    Type II or unspecified type diabetes mellitus without mention of complication, not stated as uncontrolled    Past Surgical History:  Procedure Laterality Date   ABDOMINAL HYSTERECTOMY  1985   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   Procedure: TOTAL KNEE ARTHROPLASTY;  Surgeon: Ollen Gross, MD;  Location: [facility];  Service: Orthopedics;  Laterality: Right;   Patient Active Problem List   Diagnosis Date Noted   OA (osteoarthritis) of knee 03/18/2023   Primary osteoarthritis of right knee 03/18/2023   Preop exam for internal medicine 12/20/2022   Nuclear sclerotic cataract of right eye 12/07/2022   B12 deficiency 06/20/2022   Colon polyps 06/19/2022   Arthritis of left foot 04/11/2021   CRI (chronic renal insufficiency), stage 3 (moderate) (HCC) 12/15/2020   Wrist pain, left 12/10/2018   Knee pain, right 06/19/2017   Anemia 05/08/2016   Onychomycosis 06/22/2015   Arthritis of ankle 04/01/2015   Well adult exam 08/16/2014   Goiter 11/04/2012   Left ankle pain 08/22/2011   Grief 04/20/2011   Rhinitis 04/20/2011   ABDOMINAL DISTENSION 03/09/2010   KNEE PAIN 03/04/2008   LEG CRAMPS 09/03/2007   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) 04/11/2007   Obesity 04/11/2007    Dyslipidemia associated with type 2 diabetes mellitus (HCC) 11/26/2006   Essential hypertension 11/26/2006    PCP: Tresa Garter, MD   REFERRING PROVIDER: Derenda Fennel, PA  REFERRING DIAG: M17.11 (ICD-10-CM) - Unilateral primary osteoarthritis, right knee  THERAPY DIAG:  Stiffness of right knee, not elsewhere classified  Chronic pain of right knee  Difficulty in walking, not elsewhere classified  Localized edema  Rationale for Evaluation and Treatment: Rehabilitation  ONSET DATE: 03/18/2023  SUBJECTIVE:   SUBJECTIVE STATEMENT: Patient reports she is doing good today. She did not have a chance to by a cane over the weekend.  From Eval: Patient presents post Rt TKA on 1/6. She has been walking once a day for 10 minutes. She was not given any exercises. She has increased pain with functional movements (sit to stand, getting in and out of a car, etc). She does not have any pain if her Rt knee if it is extended. She wants to get back to walking normally when running errands.  PERTINENT HISTORY: HTN, Anemia; Type II diabetes   PAIN: 04/30/2023 Are you having pain? Yes: NPRS scale: 0/10 Pain location: Rt leg Pain description: achy dull Aggravating factors: getting out car; sitting down with knee flexed Relieving factors: Pain medication  PRECAUTIONS: Other: Status post Rt TKA 03/18/2023  RED FLAGS: None   WEIGHT BEARING RESTRICTIONS:  No  FALLS:  Has patient fallen in last 6 months? No  LIVING ENVIRONMENT: Lives with: lives with their family Lives in: House/apartment Stairs: Yes: Internal: 1 steps; none Has following equipment at home: Dan Humphreys - 2 wheeled and shower chair  OCCUPATION: Psychologist, sport and exercise at [employer]  PLOF: Independent and Independent with basic ADLs  PATIENT GOALS: To get the pain gone  NEXT MD VISIT: 04/02/2023  OBJECTIVE:  Note: Objective measures were completed at Evaluation unless otherwise noted.   PATIENT SURVEYS:  FOTO 31; goal  84  COGNITION: Overall cognitive status: Within functional limits for tasks assessed     SENSATION: Rt knee Numbness   EDEMA:  Rt knee 3 cm above patella: 54.5cm 3 cam below patella:51cm At patella : 51.5 cm   POSTURE: rounded shoulders and forward head   LOWER EXTREMITY ROM: seated in wheelchair  Active ROM Right eval 03/14/2023  04/16/2023 Left eval  Hip flexion      Hip extension      Hip abduction      Hip adduction      Hip internal rotation      Hip external rotation      Knee flexion 56 AAROM 90 (semi-reclined) PROM 95    Knee extension -2 -15 -5   Ankle dorsiflexion      Ankle plantarflexion      Ankle inversion      Ankle eversion       (Blank rows = not tested)  LOWER EXTREMITY MMT: Deferred      FUNCTIONAL TESTS:  5 times sit to stand: 24.11 with Rt knee extended & UE support 03/26/2023 TUG: 42.59 03/27/2023  with front wheeled walker 44ft  04/18/2023 3MWT:373ft  GAIT: Patient did not ambulate during treatment session due to increased pain. Patient required max assistance with car transfer                                                                                                                                 TREATMENT DATE:  04/30/2023 Recumbent Bike Level 1- 7 mins PT present to discuss status (full revolutions) Gait training with straight cane Sit to stand from plinth x 5 Sit to stand staggered (Lt infront) x 10 Hurdles (forwards & sideways) reciprocal with forward pattern x 4 each Standing marching, hip abduction with 3# AW 2 x 10 bilateral  6 inch step ups x 10 bilateral  Hamstring stretch on step 2 x 30 sec on Rt  Calf stretch on rockerboard 2 x 30 sec  Vasopneumatic compression Rt knee moderate pressure x 15 minutes    04/25/2023 Recumbent Bike Level 1- 7 mins PT present to discuss status (1/2 revolutions)  Sit to stand from plinth x 5 Sit to stand form plinth with 5# DB x 10 Seated LAQ 2# 2 x 10 on Rt  Standing march 2#  in front of walker x 20  Hurdles (forwards & sideways) x 3  laps each direction Standing hip abduction at counter 2 x 10 bilateral  Standing heel raises 2 x 10 4inch step ups x 12  6 inch step ups x 8 Supine SLR 2 x8 Supine SAQ 2 x 10 Vasopneumatic compression Rt knee moderate pressure x 15 minutes      04/23/2023 Recumbent Bike Level 1- 7 mins PT present to discuss status (1/2 revolutions)  Hamstring stretch at 2nd stair 2 x 30 sec Knee flexion stretch on 2nd  stair 3 x 20 sec TKE with small blue ball x 20 Squats at barre 2 x 10 Hurdles (forwards & sideways) x 2 laps each direction 4 inch step ups x 10 on Rt  Hurdles (forwards & sideways) x 2 laps each direction Standing heel raises 2 x 10 Rt knee flexion with slider x 20 Seated knee flexion with slide + green  2 x 10 on Rt  Seated LAQ 2 x 10 in Rt  SLR 2 x 8 Manual : Rt knee joint flexion PROM Vasopneumatic compression Rt knee moderate pressure x 15 minutes    04/18/2023 Recumbent Bike Level 1- 7 mins PT present to discuss status (1/2 revolutions)  Hamstring stretch at 2nd stair 3 x 30 sec Knee flexion stretch on 2nd  stair 3 x 20 sec 3MWT:358ft Rt knee flexion with slider x 20 Sit to stand from plinth 2 x 8 Hurdles (forwards & sideways) x 2 laps each direction 6 inch step ups x 4 on Rt (increased challenge patient unable to stay erect) 4 inch step ups x 10 on Rt  Seated LAQ 2 x 10 in Rt  Standing heel raises 2 x 10 Vasopneumatic compression Rt knee moderate pressure x 15 minutes     PATIENT EDUCATION:  Education details: HEP; POC Person educated: Patient Education method: Programmer, multimedia, Demonstration, and Handouts Education comprehension: verbalized understanding, returned demonstration, and needs further education  HOME EXERCISE PROGRAM: Access Code: [email or web address] URL: [email or web address] Date: 04/09/2023 Prepared by: Claude Manges  Exercises - Seated Heel Slide  - 1 x daily - 7 x weekly - 2 sets  - 10 reps - Sitting Heel Slide with Towel  - 1 x daily - 7 x weekly - 2 sets - 10 reps - Supine Quad Set  - 1 x daily - 7 x weekly - 2 sets - 10 reps - 2-3 hold - Active Straight Leg Raise with Quad Set  - 1 x daily - 7 x weekly - 2 sets - 10 reps - Seated Hamstring Stretch  - 1 x daily - 7 x weekly - 2 sets - 10 reps - Standing March with Counter Support  - 1 x daily - 7 x weekly - 1 sets - 10 reps - Standing Hip Abduction with Counter Support  - 1 x daily - 7 x weekly - 1 sets - 10 reps - Supine Knee Extension Stretch on Towel Roll  - 1 x daily - 7 x weekly - 2 sets - 10 reps - Seated Knee Extension Stretch with Chair  - 1 x daily - 7 x weekly - 2 sets - 30 hold  ASSESSMENT:  CLINICAL IMPRESSION: Today's treatment session focused on functional strengthening and Rt knee mobility. Ronae was able to complete full revolutions on recumbent bike today. Incorporated gait training with straight cane today. Patient required verbal and visual cues for correct performance. Patient was able to ambulate with a two point gait pattern. Patient tolerated treatment session well and did not verbalize any increased pain  or discomfort. Patient will benefit from skilled PT to address the below impairments and improve overall function.       OBJECTIVE IMPAIRMENTS: Abnormal gait, decreased mobility, difficulty walking, decreased ROM, decreased strength, increased edema, increased muscle spasms, impaired flexibility, postural dysfunction, obesity, and pain.   ACTIVITY LIMITATIONS: lifting, standing, squatting, stairs, transfers, and bed mobility  PARTICIPATION LIMITATIONS: cleaning, laundry, driving, shopping, community activity, and occupation  PERSONAL FACTORS: Fitness and 1-2 comorbidities: HTN, Anemia; Type II diabetes   are also affecting patient's functional outcome.   REHAB POTENTIAL: Good  CLINICAL DECISION MAKING: Stable/uncomplicated  EVALUATION COMPLEXITY: Low   GOALS: Goals reviewed  with patient? Yes  SHORT TERM GOALS: Target date: 05/02/2023  Patient will be independent with initial HEP. Baseline:  Goal status: MET 04/18/2023  2.  Patient will be able to complete a car transfer with < or = to 2/10 knee pain. Baseline:  Goal status: INITIAL  3.  Patient will demonstrate > or = to 90 degrees of knee flexion for improved sit to stand transfer. Baseline:  Goal status: MET 04/18/2023    LONG TERM GOALS: Target date: 06/28/2023 Patient will demonstrate independence in advanced HEP. Baseline:  Goal status: INITIAL  2.  Patient will be able to ascend a curb/ step for safe community negotiation. Baseline:  Goal status: INITIAL  3.  Patient will be able to return to work and complete all work duties. Baseline:  Goal status: INITIAL  4.  Patient will score < or = to 19 sec on 5 STS for improved functional mobility. Baseline:  Goal status: INITIAL  5.  Patient will score > or = to 61 on FOTO for improved function.  Baseline:  Goal status: INITIAL  6.  Patient will be able to walk in the community and run errands with < or = to 2/10 knee pain. Baseline:  Goal status: INITIAL   PLAN:  PT FREQUENCY: 2x/week  PT DURATION: 12 weeks  PLANNED INTERVENTIONS: 97164- PT Re-evaluation, 97110-Therapeutic exercises, 97530- Therapeutic activity, 97112- Neuromuscular re-education, 97535- Self Care, 40981- Manual therapy, [phone]- Gait training, [phone]- Aquatic Therapy, 97014- Electrical stimulation (unattended), [phone]- Electrical stimulation (manual), [id number]- Vasopneumatic device, [id number]- Ultrasound, [id number]- Traction (mechanical), [id number]- Ionotophoresis 4mg /ml Dexamethasone, Patient/Family education, Balance training, Stair training, Taping, Dry Needling, Joint mobilization, Joint manipulation, Spinal manipulation, Spinal mobilization, Scar mobilization, Vestibular training, Cryotherapy, and Moist heat  PLAN FOR NEXT SESSION: with straight cane; continue functional  strengthening & Rt knee flexion mobility  Claude Manges, PT 04/30/23 4:08 PM [facility] [address] [place], [place] [address] Phone # [phone] Fax [phone]

## 2023-05-02 ENCOUNTER — Ambulatory Visit: Payer: Commercial Managed Care - PPO | Admitting: Physical Therapy

## 2023-05-03 ENCOUNTER — Other Ambulatory Visit (HOSPITAL_COMMUNITY): Payer: Self-pay

## 2023-05-07 ENCOUNTER — Ambulatory Visit: Payer: Commercial Managed Care - PPO

## 2023-05-07 DIAGNOSIS — R262 Difficulty in walking, not elsewhere classified: Secondary | ICD-10-CM

## 2023-05-07 DIAGNOSIS — R252 Cramp and spasm: Secondary | ICD-10-CM

## 2023-05-07 DIAGNOSIS — G8929 Other chronic pain: Secondary | ICD-10-CM

## 2023-05-07 DIAGNOSIS — R600 Localized edema: Secondary | ICD-10-CM | POA: Diagnosis not present

## 2023-05-07 DIAGNOSIS — M6281 Muscle weakness (generalized): Secondary | ICD-10-CM

## 2023-05-07 DIAGNOSIS — M25561 Pain in right knee: Secondary | ICD-10-CM | POA: Diagnosis not present

## 2023-05-07 DIAGNOSIS — M25661 Stiffness of right knee, not elsewhere classified: Secondary | ICD-10-CM

## 2023-05-07 NOTE — Therapy (Signed)
 OUTPATIENT PHYSICAL THERAPY LOWER EXTREMITY TREATMENT   Patient Name: Katelyn Peterson MRN: [id number] DOB:December 18, 1945, 78 y.o., female Today's Date: 05/07/2023  END OF SESSION:  PT End of Session - 05/07/23 1500     Visit Number 13    Date for PT Re-Evaluation 06/28/23    Authorization Type [facility] [employer]    PT Start Time 1447    PT Stop Time 1546    PT Time Calculation (min) 59 min    Activity Tolerance Patient tolerated treatment well    Behavior During Therapy WFL for tasks assessed/performed              Past Medical History:  Diagnosis Date   Anemia    Arthritis    HTN (hypertension)    Hyperlipidemia    Obesity    Type II or unspecified type diabetes mellitus without mention of complication, not stated as uncontrolled    Past Surgical History:  Procedure Laterality Date   ABDOMINAL HYSTERECTOMY  1985   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   Procedure: TOTAL KNEE ARTHROPLASTY;  Surgeon: Ollen Gross, MD;  Location: [facility];  Service: Orthopedics;  Laterality: Right;   Patient Active Problem List   Diagnosis Date Noted   OA (osteoarthritis) of knee 03/18/2023   Primary osteoarthritis of right knee 03/18/2023   Preop exam for internal medicine 12/20/2022   Nuclear sclerotic cataract of right eye 12/07/2022   B12 deficiency 06/20/2022   Colon polyps 06/19/2022   Arthritis of left foot 04/11/2021   CRI (chronic renal insufficiency), stage 3 (moderate) (HCC) 12/15/2020   Wrist pain, left 12/10/2018   Knee pain, right 06/19/2017   Anemia 05/08/2016   Onychomycosis 06/22/2015   Arthritis of ankle 04/01/2015   Well adult exam 08/16/2014   Goiter 11/04/2012   Left ankle pain 08/22/2011   Grief 04/20/2011   Rhinitis 04/20/2011   ABDOMINAL DISTENSION 03/09/2010   KNEE PAIN 03/04/2008   LEG CRAMPS 09/03/2007   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) 04/11/2007   Obesity 04/11/2007   Dyslipidemia associated  with type 2 diabetes mellitus (HCC) 11/26/2006   Essential hypertension 11/26/2006    PCP: Tresa Garter, MD   REFERRING PROVIDER: Derenda Fennel, PA  REFERRING DIAG: M17.11 (ICD-10-CM) - Unilateral primary osteoarthritis, right knee  THERAPY DIAG:  Stiffness of right knee, not elsewhere classified  Chronic pain of right knee  Difficulty in walking, not elsewhere classified  Cramp and spasm  Muscle weakness (generalized)  Rationale for Evaluation and Treatment: Rehabilitation  ONSET DATE: 03/18/2023  SUBJECTIVE:   SUBJECTIVE STATEMENT: Patient arrives with walker and cane.  She states that she has been walking around at home without any assistive device.  She is eager to return to work.    From Eval: Patient presents post Rt TKA on 1/6. She has been walking once a day for 10 minutes. She was not given any exercises. She has increased pain with functional movements (sit to stand, getting in and out of a car, etc). She does not have any pain if her Rt knee if it is extended. She wants to get back to walking normally when running errands.  PERTINENT HISTORY: HTN, Anemia; Type II diabetes   PAIN: 05/07/2023 Are you having pain? Yes: NPRS scale: 0/10 Pain location: Rt leg Pain description: achy dull Aggravating factors: getting out car; sitting down with knee flexed Relieving factors: Pain medication  PRECAUTIONS: Other: Status post Rt TKA 03/18/2023  RED FLAGS:  None   WEIGHT BEARING RESTRICTIONS: No  FALLS:  Has patient fallen in last 6 months? No  LIVING ENVIRONMENT: Lives with: lives with their family Lives in: House/apartment Stairs: Yes: Internal: 1 steps; none Has following equipment at home: Dan Humphreys - 2 wheeled and shower chair  OCCUPATION: Psychologist, sport and exercise at [employer]  PLOF: Independent and Independent with basic ADLs  PATIENT GOALS: To get the pain gone  NEXT MD VISIT: 04/02/2023  OBJECTIVE:  Note: Objective measures were completed at Evaluation  unless otherwise noted.   PATIENT SURVEYS:  FOTO 31; goal 43  COGNITION: Overall cognitive status: Within functional limits for tasks assessed     SENSATION: Rt knee Numbness   EDEMA:  Rt knee 3 cm above patella: 54.5cm 3 cam below patella:51cm At patella : 51.5 cm   POSTURE: rounded shoulders and forward head   LOWER EXTREMITY ROM: seated in wheelchair  Active ROM Right eval 03/14/2023  04/16/2023 Left eval  Hip flexion      Hip extension      Hip abduction      Hip adduction      Hip internal rotation      Hip external rotation      Knee flexion 56 AAROM 90 (semi-reclined) PROM 95    Knee extension -2 -15 -5   Ankle dorsiflexion      Ankle plantarflexion      Ankle inversion      Ankle eversion       (Blank rows = not tested)  LOWER EXTREMITY MMT: Deferred      FUNCTIONAL TESTS:  5 times sit to stand: 24.11 with Rt knee extended & UE support 03/26/2023 TUG: 42.59 03/27/2023  with front wheeled walker 58ft  04/18/2023 3MWT:353ft  GAIT: Patient did not ambulate during treatment session due to increased pain. Patient required max assistance with car transfer                                                                                                                                 TREATMENT DATE:  04/30/2023 Recumbent Bike Level 1- 7 mins PT present to discuss status (full revolutions) Gait training with straight cane x 100 feet , no cane x 100 feet Sit to stand from plinth x 10 Sit to stand staggered (Lt infront) x 10 Seated LAQ x 20 Educated on need to prop for extension and avoid sitting with knee in flexed position for long periods of time: returning to work and things to consider with positioning and that she will need to continue to consider her knee is still healing and she will need to be mindful of this if she returns to work.  Hurdles (forwards & sideways) reciprocal with forward pattern x 4 each Standing marching, and mini squats on  balance pad x 20 TKE with blue tband (PT anchoring) x 20 6 inch step ups x 10 bilateral fwd and lateral Lunges to balance pad  fwd x 10 then lateral x 10 Vasopneumatic compression Rt knee moderate pressure x 15 minutes   04/25/2023 Recumbent Bike Level 1- 7 mins PT present to discuss status (1/2 revolutions)  Sit to stand from plinth x 5 Sit to stand form plinth with 5# DB x 10 Seated LAQ 2# 2 x 10 on Rt  Standing march 2# in front of walker x 20  Hurdles (forwards & sideways) x 3 laps each direction Standing hip abduction at counter 2 x 10 bilateral  Standing heel raises 2 x 10 4inch step ups x 12  6 inch step ups x 8 Supine SLR 2 x8 Supine SAQ 2 x 10 Vasopneumatic compression Rt knee moderate pressure x 15 minutes  04/23/2023 Recumbent Bike Level 1- 7 mins PT present to discuss status (1/2 revolutions)  Hamstring stretch at 2nd stair 2 x 30 sec Knee flexion stretch on 2nd  stair 3 x 20 sec TKE with small blue ball x 20 Squats at barre 2 x 10 Hurdles (forwards & sideways) x 2 laps each direction 4 inch step ups x 10 on Rt  Hurdles (forwards & sideways) x 2 laps each direction Standing heel raises 2 x 10 Rt knee flexion with slider x 20 Seated knee flexion with slide + green  2 x 10 on Rt  Seated LAQ 2 x 10 in Rt  SLR 2 x 8 Manual : Rt knee joint flexion PROM Vasopneumatic compression Rt knee moderate pressure x 15 minutes   PATIENT EDUCATION:  Education details: HEP; POC Person educated: Patient Education method: Programmer, multimedia, Demonstration, and Handouts Education comprehension: verbalized understanding, returned demonstration, and needs further education  HOME EXERCISE PROGRAM: Access Code: [email or web address] URL: [email or web address] Date: 04/09/2023 Prepared by: Claude Manges  Exercises - Seated Heel Slide  - 1 x daily - 7 x weekly - 2 sets - 10 reps - Sitting Heel Slide with Towel  - 1 x daily - 7 x weekly - 2 sets - 10 reps - Supine Quad Set  - 1 x daily -  7 x weekly - 2 sets - 10 reps - 2-3 hold - Active Straight Leg Raise with Quad Set  - 1 x daily - 7 x weekly - 2 sets - 10 reps - Seated Hamstring Stretch  - 1 x daily - 7 x weekly - 2 sets - 10 reps - Standing March with Counter Support  - 1 x daily - 7 x weekly - 1 sets - 10 reps - Standing Hip Abduction with Counter Support  - 1 x daily - 7 x weekly - 1 sets - 10 reps - Supine Knee Extension Stretch on Towel Roll  - 1 x daily - 7 x weekly - 2 sets - 10 reps - Seated Knee Extension Stretch with Chair  - 1 x daily - 7 x weekly - 2 sets - 30 hold  ASSESSMENT:  CLINICAL IMPRESSION: Katelyn Peterson is 7 weeks post op.  She is ambulating with no assistive device at home and is eager to return to work.  She lacks full extension and gait continues to be abnormal.  She demonstrates short step length and lacks full extension on heel strike.  She is eager to return to work and may be ok to do so if she is able to get up and move around and is able to prop her leg for extension.  She is well motivated and compliant and should ocntinue to do well.   Patient will benefit from skilled PT  to address the below impairments and improve overall function.  OBJECTIVE IMPAIRMENTS: Abnormal gait, decreased mobility, difficulty walking, decreased ROM, decreased strength, increased edema, increased muscle spasms, impaired flexibility, postural dysfunction, obesity, and pain.   ACTIVITY LIMITATIONS: lifting, standing, squatting, stairs, transfers, and bed mobility  PARTICIPATION LIMITATIONS: cleaning, laundry, driving, shopping, community activity, and occupation  PERSONAL FACTORS: Fitness and 1-2 comorbidities: HTN, Anemia; Type II diabetes   are also affecting patient's functional outcome.   REHAB POTENTIAL: Good  CLINICAL DECISION MAKING: Stable/uncomplicated  EVALUATION COMPLEXITY: Low   GOALS: Goals reviewed with patient? Yes  SHORT TERM GOALS: Target date: 05/02/2023  Patient will be independent with initial  HEP. Baseline:  Goal status: MET 04/18/2023  2.  Patient will be able to complete a car transfer with < or = to 2/10 knee pain. Baseline:  Goal status: INITIAL  3.  Patient will demonstrate > or = to 90 degrees of knee flexion for improved sit to stand transfer. Baseline:  Goal status: MET 04/18/2023    LONG TERM GOALS: Target date: 06/28/2023 Patient will demonstrate independence in advanced HEP. Baseline:  Goal status: INITIAL  2.  Patient will be able to ascend a curb/ step for safe community negotiation. Baseline:  Goal status: MET 05/07/23  3.  Patient will be able to return to work and complete all work duties. Baseline:  Goal status: INITIAL  4.  Patient will score < or = to 19 sec on 5 STS for improved functional mobility. Baseline:  Goal status: INITIAL  5.  Patient will score > or = to 61 on FOTO for improved function.  Baseline:  Goal status: INITIAL  6.  Patient will be able to walk in the community and run errands with < or = to 2/10 knee pain. Baseline:  Goal status: INITIAL   PLAN:  PT FREQUENCY: 2x/week  PT DURATION: 12 weeks  PLANNED INTERVENTIONS: 97164- PT Re-evaluation, 97110-Therapeutic exercises, 97530- Therapeutic activity, [id number]- Neuromuscular re-education, 97535- Self Care, 16109- Manual therapy, [id number]- Gait training, [phone]- Aquatic Therapy, 97014- Electrical stimulation (unattended), [id number]- Electrical stimulation (manual), [id number]- Vasopneumatic device, [id number]- Ultrasound, [id number]- Traction (mechanical), [id number]- Ionotophoresis 4mg /ml Dexamethasone, Patient/Family education, Balance training, Stair training, Taping, Dry Needling, Joint mobilization, Joint manipulation, Spinal manipulation, Spinal mobilization, Scar mobilization, Vestibular training, Cryotherapy, and Moist heat  PLAN FOR NEXT SESSION: with straight cane; continue functional strengthening & Rt knee flexion mobility  Kodi Steil B. Kajuan Guyton, PT 05/07/23 8:21 PM [facility] [address], [address] [place], [place] [address] Phone # [phone] Fax [phone]

## 2023-05-09 ENCOUNTER — Ambulatory Visit: Payer: Commercial Managed Care - PPO | Admitting: Physical Therapy

## 2023-05-09 ENCOUNTER — Encounter: Payer: Self-pay | Admitting: Physical Therapy

## 2023-05-09 DIAGNOSIS — M25661 Stiffness of right knee, not elsewhere classified: Secondary | ICD-10-CM | POA: Diagnosis not present

## 2023-05-09 DIAGNOSIS — R262 Difficulty in walking, not elsewhere classified: Secondary | ICD-10-CM | POA: Diagnosis not present

## 2023-05-09 DIAGNOSIS — M6281 Muscle weakness (generalized): Secondary | ICD-10-CM | POA: Diagnosis not present

## 2023-05-09 DIAGNOSIS — R252 Cramp and spasm: Secondary | ICD-10-CM | POA: Diagnosis not present

## 2023-05-09 DIAGNOSIS — G8929 Other chronic pain: Secondary | ICD-10-CM

## 2023-05-09 DIAGNOSIS — R600 Localized edema: Secondary | ICD-10-CM | POA: Diagnosis not present

## 2023-05-09 DIAGNOSIS — M25561 Pain in right knee: Secondary | ICD-10-CM | POA: Diagnosis not present

## 2023-05-09 NOTE — Therapy (Signed)
 OUTPATIENT PHYSICAL THERAPY LOWER EXTREMITY TREATMENT   Patient Name: Katelyn Peterson MRN: [id number] DOB:Mar 25, 1945, 78 y.o., female Today's Date: 05/09/2023  END OF SESSION:  PT End of Session - 05/09/23 1553     Visit Number 14    Date for PT Re-Evaluation 06/28/23    Authorization Type [facility] [employer]    PT Start Time [phone]    PT Stop Time 1546    PT Time Calculation (min) 58 min               Past Medical History:  Diagnosis Date   Anemia    Arthritis    HTN (hypertension)    Hyperlipidemia    Obesity    Type II or unspecified type diabetes mellitus without mention of complication, not stated as uncontrolled    Past Surgical History:  Procedure Laterality Date   ABDOMINAL HYSTERECTOMY  1985   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   Procedure: TOTAL KNEE ARTHROPLASTY;  Surgeon: Ollen Gross, MD;  Location: [facility];  Service: Orthopedics;  Laterality: Right;   Patient Active Problem List   Diagnosis Date Noted   OA (osteoarthritis) of knee 03/18/2023   Primary osteoarthritis of right knee 03/18/2023   Preop exam for internal medicine 12/20/2022   Nuclear sclerotic cataract of right eye 12/07/2022   B12 deficiency 06/20/2022   Colon polyps 06/19/2022   Arthritis of left foot 04/11/2021   CRI (chronic renal insufficiency), stage 3 (moderate) (HCC) 12/15/2020   Wrist pain, left 12/10/2018   Knee pain, right 06/19/2017   Anemia 05/08/2016   Onychomycosis 06/22/2015   Arthritis of ankle 04/01/2015   Well adult exam 08/16/2014   Goiter 11/04/2012   Left ankle pain 08/22/2011   Grief 04/20/2011   Rhinitis 04/20/2011   ABDOMINAL DISTENSION 03/09/2010   KNEE PAIN 03/04/2008   LEG CRAMPS 09/03/2007   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) 04/11/2007   Obesity 04/11/2007   Dyslipidemia associated with type 2 diabetes mellitus (HCC) 11/26/2006   Essential hypertension 11/26/2006    PCP: Tresa Garter,  MD   REFERRING PROVIDER: Derenda Fennel, PA  REFERRING DIAG: M17.11 (ICD-10-CM) - Unilateral primary osteoarthritis, right knee  THERAPY DIAG:  Stiffness of right knee, not elsewhere classified  Chronic pain of right knee  Difficulty in walking, not elsewhere classified  Cramp and spasm  Muscle weakness (generalized)  Localized edema  Rationale for Evaluation and Treatment: Rehabilitation  ONSET DATE: 03/18/2023  SUBJECTIVE:   SUBJECTIVE STATEMENT: Patient reports she is doing good today. She is not currently having any pain.  From Eval: Patient presents post Rt TKA on 1/6. She has been walking once a day for 10 minutes. She was not given any exercises. She has increased pain with functional movements (sit to stand, getting in and out of a car, etc). She does not have any pain if her Rt knee if it is extended. She wants to get back to walking normally when running errands.  PERTINENT HISTORY: HTN, Anemia; Type II diabetes   PAIN: 05/09/2023 Are you having pain? Yes: NPRS scale: 0/10 Pain location: Rt leg Pain description: achy dull Aggravating factors: getting out car; sitting down with knee flexed Relieving factors: Pain medication  PRECAUTIONS: Other: Status post Rt TKA 03/18/2023  RED FLAGS: None   WEIGHT BEARING RESTRICTIONS: No  FALLS:  Has patient fallen in last 6 months? No  LIVING ENVIRONMENT: Lives with: lives with their family Lives in: House/apartment Stairs: Yes:  Internal: 1 steps; none Has following equipment at home: Dan Humphreys - 2 wheeled and shower chair  OCCUPATION: Psychologist, sport and exercise at [employer]  PLOF: Independent and Independent with basic ADLs  PATIENT GOALS: To get the pain gone  NEXT MD VISIT: 04/02/2023  OBJECTIVE:  Note: Objective measures were completed at Evaluation unless otherwise noted.   PATIENT SURVEYS:  FOTO 31; goal 61  COGNITION: Overall cognitive status: Within functional limits for tasks assessed     SENSATION: Rt knee  Numbness   EDEMA:  Rt knee 3 cm above patella: 54.5cm 3 cam below patella:51cm At patella : 51.5 cm   POSTURE: rounded shoulders and forward head   LOWER EXTREMITY ROM: seated in wheelchair  Active ROM Right eval 03/14/2023  04/16/2023 Left eval  Hip flexion      Hip extension      Hip abduction      Hip adduction      Hip internal rotation      Hip external rotation      Knee flexion 56 AAROM 90 (semi-reclined) PROM 95    Knee extension -2 -15 -5   Ankle dorsiflexion      Ankle plantarflexion      Ankle inversion      Ankle eversion       (Blank rows = not tested)  LOWER EXTREMITY MMT: Deferred      FUNCTIONAL TESTS:  5 times sit to stand: 24.11 with Rt knee extended & UE support 03/26/2023 TUG: 42.59 03/27/2023  with front wheeled walker 62ft  04/18/2023 3MWT:361ft  GAIT: Patient did not ambulate during treatment session due to increased pain. Patient required max assistance with car transfer                                                                                                                                 TREATMENT DATE:  05/09/2023 Recumbent Bike Level 1- 7 mins PT present to discuss status (full revolutions) Gait training with no cane x 100 feet- emphasis on heel strike Sit to stand from plinth x 10 5# KB hold Sit to stand staggered (Lt infront) x 10 5# KB hold Seated LAQ x 20 3# AW Hurdles (forwards & sideways) reciprocal with forward pattern x 4 each Mini squats on balance pad x 20 TKE with blue tband (PT anchoring) x 20 6 inch step ups x 12 bilateral fwd and lateral Lunges to balance pad fwd x 10 then lateral x 10 Supine SLR 2 x 10 Rt  Vasopneumatic compression Rt knee moderate pressure x 15 minutes    05/07/2023 Recumbent Bike Level 1- 7 mins PT present to discuss status (full revolutions) Gait training with straight cane x 100 feet , no cane x 100 feet Sit to stand from plinth x 10 Sit to stand staggered (Lt infront) x  10 Seated LAQ x 20 Educated on need to prop for extension and avoid sitting with knee in flexed  position for long periods of time: returning to work and things to consider with positioning and that she will need to continue to consider her knee is still healing and she will need to be mindful of this if she returns to work.  Hurdles (forwards & sideways) reciprocal with forward pattern x 4 each Standing marching, and mini squats on balance pad x 20 TKE with blue tband (PT anchoring) x 20 6 inch step ups x 10 bilateral fwd and lateral Lunges to balance pad fwd x 10 then lateral x 10 Vasopneumatic compression Rt knee moderate pressure x 15 minutes   04/25/2023 Recumbent Bike Level 1- 7 mins PT present to discuss status (1/2 revolutions)  Sit to stand from plinth x 5 Sit to stand form plinth with 5# DB x 10 Seated LAQ 2# 2 x 10 on Rt  Standing march 2# in front of walker x 20  Hurdles (forwards & sideways) x 3 laps each direction Standing hip abduction at counter 2 x 10 bilateral  Standing heel raises 2 x 10 4inch step ups x 12  6 inch step ups x 8 Supine SLR 2 x8 Supine SAQ 2 x 10 Vasopneumatic compression Rt knee moderate pressure x 15 minutes    PATIENT EDUCATION:  Education details: HEP; POC Person educated: Patient Education method: Programmer, multimedia, Demonstration, and Handouts Education comprehension: verbalized understanding, returned demonstration, and needs further education  HOME EXERCISE PROGRAM: Access Code: [email or web address] URL: [email or web address] Date: 04/09/2023 Prepared by: Claude Manges  Exercises - Seated Heel Slide  - 1 x daily - 7 x weekly - 2 sets - 10 reps - Sitting Heel Slide with Towel  - 1 x daily - 7 x weekly - 2 sets - 10 reps - Supine Quad Set  - 1 x daily - 7 x weekly - 2 sets - 10 reps - 2-3 hold - Active Straight Leg Raise with Quad Set  - 1 x daily - 7 x weekly - 2 sets - 10 reps - Seated Hamstring Stretch  - 1 x daily - 7 x weekly - 2 sets -  10 reps - Standing March with Counter Support  - 1 x daily - 7 x weekly - 1 sets - 10 reps - Standing Hip Abduction with Counter Support  - 1 x daily - 7 x weekly - 1 sets - 10 reps - Supine Knee Extension Stretch on Towel Roll  - 1 x daily - 7 x weekly - 2 sets - 10 reps - Seated Knee Extension Stretch with Chair  - 1 x daily - 7 x weekly - 2 sets - 30 hold  ASSESSMENT:  CLINICAL IMPRESSION: Kenidee is progressing appropriately with physical therapy. Incorporated more extension based activities to help with extension with ambulation. Patient require minimal verbal cues for form correct. Supine SLR remains to be a challenge for patient. Patient will benefit from skilled PT to address the below impairments and improve overall function.   OBJECTIVE IMPAIRMENTS: Abnormal gait, decreased mobility, difficulty walking, decreased ROM, decreased strength, increased edema, increased muscle spasms, impaired flexibility, postural dysfunction, obesity, and pain.   ACTIVITY LIMITATIONS: lifting, standing, squatting, stairs, transfers, and bed mobility  PARTICIPATION LIMITATIONS: cleaning, laundry, driving, shopping, community activity, and occupation  PERSONAL FACTORS: Fitness and 1-2 comorbidities: HTN, Anemia; Type II diabetes   are also affecting patient's functional outcome.   REHAB POTENTIAL: Good  CLINICAL DECISION MAKING: Stable/uncomplicated  EVALUATION COMPLEXITY: Low   GOALS: Goals reviewed with patient? Yes  SHORT TERM GOALS:  Target date: 05/02/2023  Patient will be independent with initial HEP. Baseline:  Goal status: MET 04/18/2023  2.  Patient will be able to complete a car transfer with < or = to 2/10 knee pain. Baseline:  Goal status: INITIAL  3.  Patient will demonstrate > or = to 90 degrees of knee flexion for improved sit to stand transfer. Baseline:  Goal status: MET 04/18/2023    LONG TERM GOALS: Target date: 06/28/2023 Patient will demonstrate independence in  advanced HEP. Baseline:  Goal status: INITIAL  2.  Patient will be able to ascend a curb/ step for safe community negotiation. Baseline:  Goal status: MET 05/07/23  3.  Patient will be able to return to work and complete all work duties. Baseline:  Goal status: INITIAL  4.  Patient will score < or = to 19 sec on 5 STS for improved functional mobility. Baseline:  Goal status: INITIAL  5.  Patient will score > or = to 61 on FOTO for improved function.  Baseline:  Goal status: INITIAL  6.  Patient will be able to walk in the community and run errands with < or = to 2/10 knee pain. Baseline:  Goal status: INITIAL   PLAN:  PT FREQUENCY: 2x/week  PT DURATION: 12 weeks  PLANNED INTERVENTIONS: 97164- PT Re-evaluation, 97110-Therapeutic exercises, 97530- Therapeutic activity, 97112- Neuromuscular re-education, 97535- Self Care, 69629- Manual therapy, [phone]- Gait training, [phone]- Aquatic Therapy, 97014- Electrical stimulation (unattended), [phone]- Electrical stimulation (manual), [id number]- Vasopneumatic device, [id number]- Ultrasound, [id number]- Traction (mechanical), [id number]- Ionotophoresis 4mg /ml Dexamethasone, Patient/Family education, Balance training, Stair training, Taping, Dry Needling, Joint mobilization, Joint manipulation, Spinal manipulation, Spinal mobilization, Scar mobilization, Vestibular training, Cryotherapy, and Moist heat  PLAN FOR NEXT SESSION: 5STS; TUG; continue functional strengthening & Rt knee flexion mobility  Claude Manges, PT 05/09/23 3:54 PM [facility] [address] [place], [place] [address] Phone # [phone] Fax [phone]

## 2023-05-10 ENCOUNTER — Other Ambulatory Visit (HOSPITAL_COMMUNITY): Payer: Self-pay

## 2023-05-10 MED ORDER — MOXIFLOXACIN HCL 0.5 % OP SOLN
OPHTHALMIC | 1 refills | Status: DC
  Filled 2023-05-10: qty 3, 10d supply, fill #0
  Filled 2023-06-28: qty 3, 10d supply, fill #1

## 2023-05-10 MED ORDER — PREDNISOLONE ACETATE 1 % OP SUSP
1.0000 [drp] | Freq: Four times a day (QID) | OPHTHALMIC | 2 refills | Status: DC
  Filled 2023-05-10: qty 5, 25d supply, fill #0
  Filled 2023-06-28: qty 5, 25d supply, fill #1

## 2023-05-10 MED ORDER — BROMFENAC SODIUM (ONCE-DAILY) 0.09 % OP SOLN
1.0000 [drp] | Freq: Every day | OPHTHALMIC | 1 refills | Status: DC
Start: 2023-05-10 — End: 2023-07-23
  Filled 2023-05-10: qty 1.7, 34d supply, fill #0
  Filled 2023-06-28: qty 1.7, 34d supply, fill #1
  Filled 2023-07-16: qty 1.7, 34d supply, fill #0
  Filled 2023-07-16 (×2): qty 1.7, 34d supply, fill #1

## 2023-05-13 ENCOUNTER — Other Ambulatory Visit (HOSPITAL_COMMUNITY): Payer: Self-pay

## 2023-05-14 ENCOUNTER — Ambulatory Visit: Payer: Commercial Managed Care - PPO | Admitting: Internal Medicine

## 2023-05-14 ENCOUNTER — Encounter: Payer: Self-pay | Admitting: Internal Medicine

## 2023-05-14 ENCOUNTER — Ambulatory Visit: Payer: Commercial Managed Care - PPO | Attending: Student | Admitting: Physical Therapy

## 2023-05-14 ENCOUNTER — Encounter: Payer: Self-pay | Admitting: Physical Therapy

## 2023-05-14 VITALS — BP 120/70 | HR 73 | Ht 64.5 in | Wt 188.4 lb

## 2023-05-14 DIAGNOSIS — E785 Hyperlipidemia, unspecified: Secondary | ICD-10-CM | POA: Diagnosis not present

## 2023-05-14 DIAGNOSIS — G8929 Other chronic pain: Secondary | ICD-10-CM | POA: Insufficient documentation

## 2023-05-14 DIAGNOSIS — R600 Localized edema: Secondary | ICD-10-CM | POA: Diagnosis not present

## 2023-05-14 DIAGNOSIS — M6281 Muscle weakness (generalized): Secondary | ICD-10-CM | POA: Insufficient documentation

## 2023-05-14 DIAGNOSIS — Z7985 Long-term (current) use of injectable non-insulin antidiabetic drugs: Secondary | ICD-10-CM

## 2023-05-14 DIAGNOSIS — E1169 Type 2 diabetes mellitus with other specified complication: Secondary | ICD-10-CM | POA: Diagnosis not present

## 2023-05-14 DIAGNOSIS — M25661 Stiffness of right knee, not elsewhere classified: Secondary | ICD-10-CM | POA: Insufficient documentation

## 2023-05-14 DIAGNOSIS — E66811 Obesity, class 1: Secondary | ICD-10-CM

## 2023-05-14 DIAGNOSIS — Z7984 Long term (current) use of oral hypoglycemic drugs: Secondary | ICD-10-CM | POA: Diagnosis not present

## 2023-05-14 DIAGNOSIS — E113592 Type 2 diabetes mellitus with proliferative diabetic retinopathy without macular edema, left eye: Secondary | ICD-10-CM | POA: Diagnosis not present

## 2023-05-14 DIAGNOSIS — R252 Cramp and spasm: Secondary | ICD-10-CM | POA: Insufficient documentation

## 2023-05-14 DIAGNOSIS — M25561 Pain in right knee: Secondary | ICD-10-CM | POA: Diagnosis not present

## 2023-05-14 DIAGNOSIS — R262 Difficulty in walking, not elsewhere classified: Secondary | ICD-10-CM | POA: Diagnosis not present

## 2023-05-14 LAB — POCT GLYCOSYLATED HEMOGLOBIN (HGB A1C): Hemoglobin A1C: 6.1 % — AB (ref 4.0–5.6)

## 2023-05-14 NOTE — Patient Instructions (Addendum)
 Please continue: - Jardiance 10 mg in am - Ozempic 0.5 mg weekly   Please come back for a follow-up appointment in 6 months.

## 2023-05-14 NOTE — Progress Notes (Signed)
 Patient ID: Katelyn Peterson, female   DOB: 08-Jul-1945, 78 y.o.   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 Last visit 6 months ago.  Interim history: No increased urination, blurry vision, nausea, chest pain. She had right knee arthroplasty in 03/2023. She is still out of work. Still in PT. She has no pain, just some stiffness. She will have cataract surgery at the end of the month.   Reviewed HbA1c levels: Lab Results  Component Value Date   HGBA1C 6.0 (H) 03/12/2023   HGBA1C 6.2 09/19/2022   HGBA1C 5.8 (H) 04/18/2022  10/2022: HbA1c reportedly 5.6%  Pt is on a regimen of: - Metformin 1000 mg twice a day >> once a day >> stopped 03/2023 around the time of the surgery - Jardiance 10 mg daily in am - Januvia 100 mg daily in am >> Ozempic 0.5 mg weekly 10/2021 We stopped Amaryl 2 mg bid and Actos 30 mg daily for daily hypoglycemia events and possible SEs, respectively.  Pt checks her sugars once a day: - am:   83-111, 120 >> 87-100 >> 78, 87-106, 124 >> 79-102, 110 - 2h after b'fast: n/c >> 131 >> n/c >> 114 >> n/c - before lunch: n/c >> 93 >> 100-110 >> n/c - 2h after lunch:  110, 121 >> n/c >> 93-99 >> n/c - before dinner: 105, 115 >> n/c >> 102 >> n/c - 2h after dinner: 135, 135 >> n/c >> 96-101 - bedtime:   90 >> 70 >> n/c Lowest sugar was 78 >> 79;  she has hypoglycemia awareness in the 60s. Highest sugar was 124 x1 >> 110.  -No CKD, last BUN/creatinine:  Lab Results  Component Value Date   BUN 25 (H) 03/19/2023   CREATININE 1.16 (H) 03/19/2023  On quinapril 40.  Normal ACR: Lab Results  Component Value Date   MICRALBCREAT 0.9 11/13/2022   MICRALBCREAT 1.4 06/08/2020   MICRALBCREAT 2.3 06/09/2019   MICRALBCREAT 0.9 06/10/2018   MICRALBCREAT 1.5 01/07/2017   MICRALBCREAT 0.9 05/08/2016   -+ HL; last set of lipids: Lab Results   Component Value Date   CHOL 155 12/18/2021   HDL 56.80 12/18/2021   LDLCALC 86 12/18/2021   LDLDIRECT 146.5 09/18/2012   TRIG 62.0 12/18/2021   CHOLHDL 3 12/18/2021  She had leg cramps with statins: Rosuvastatin, pravastatin, atorvastatin and fluvastatin XL.  She refused to try Zetia.  Now on Lipitor low-dose every other day (or sometimes 2x a week) and she uses a muscle rub cream for cramps.  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 Ree Edman.  - She denies numbness and tingling in her feet.  Last foot exam 05/11/2022.  She has a history of HTN. She has been considered for lap band surgery >> she did not want to follow through with this. She also has bilateral chronic lower extremity cramps, right knee pain, anemia.  Latest TSH was normal: Lab Results  Component Value Date   TSH 1.75 12/18/2021   ROS: + see HPI  I reviewed pt's medications, allergies, PMH, social hx, family hx, and changes were documented in the history of present illness. Otherwise, unchanged from my initial visit note.  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   Procedure: TOTAL KNEE ARTHROPLASTY;  Surgeon: Ollen Gross, MD;  Location: [facility];  Service: Orthopedics;  Laterality: Right;   Social History   Socioeconomic History   Marital status: Married    Spouse name: Not on file   Number of children: Not on file   Years of education: Not on file   Highest education level: Not on file  Occupational History   Occupation: [employer]  Tobacco Use   Smoking status: Never   Smokeless tobacco: Never  Vaping Use   Vaping status: Never Used  Substance and Sexual  Activity   Alcohol use: No   Drug use: No   Sexual activity: Yes    Partners: Male  Other Topics Concern   Not on file  Social History Narrative   Regular exercise: seldom   Caffeine use: occasionally         Social Drivers of Health   Financial Resource Strain: Not on file  Food Insecurity: Patient Declined (03/18/2023)   Hunger Vital Sign    Worried About Running Out of Food in the Last Year: Patient declined    Ran Out of Food in the Last Year: Patient declined  Transportation Needs: No Transportation Needs (03/18/2023)   PRAPARE - Administrator, Civil Service (Medical): No    Lack of Transportation (Non-Medical): No  Physical Activity: Not on file  Stress: Not on file  Social Connections: Patient Declined (03/18/2023)   Social Connection and Isolation Panel [NHANES]    Frequency of Communication with Friends and Family: Patient declined    Frequency of Social Gatherings with Friends and Family: Patient declined    Attends Religious Services: Patient declined    Database administrator or Organizations: Patient declined    Attends Banker Meetings: Patient declined    Marital Status: Patient declined  Intimate Partner Violence: Patient Declined (03/18/2023)   Humiliation, Afraid, Rape, and Kick questionnaire    Fear of Current or Ex-Partner: Patient declined    Emotionally Abused: Patient declined    Physically Abused: Patient declined    Sexually Abused: Patient declined   Current Outpatient Medications on File Prior to Visit  Medication Sig Dispense Refill   ascorbic acid (VITAMIN C) 500 MG tablet Take 500 mg by mouth daily.     atorvastatin (LIPITOR) 20 MG tablet TAKE 1 TABLET BY MOUTH ONCE DAILY (Patient taking differently: Take 20 mg by mouth 2 (two) times a week.) 90 tablet 3   Bromfenac Sodium 0.09 % SOLN INSTILL 1 drop in the right eye once a day. Start drops after you arrive home from surgery. 1.7 mL 1   Cholecalciferol (VITAMIN D3) 250 MCG  (10000 UT) capsule Take 10,000 Units by mouth daily.     Cyanocobalamin (B-12 COMPLIANCE INJECTION IJ) Inject 1 Dose as directed every 14 (fourteen) days.     empagliflozin (JARDIANCE) 10 MG TABS tablet Take 1 tablet (10 mg total) by mouth daily before breakfast. 90 tablet 3   Iron, Ferrous Sulfate, 325 (65 Fe) MG TABS Take 1 tablet by mouth daily. 30 tablet 3   lisinopril (ZESTRIL) 40 MG tablet Take 1 tablet (40 mg total) by mouth daily. 90 tablet 3   metFORMIN (GLUCOPHAGE) 500 MG tablet Take 1 tablet (500  mg total) by mouth daily. 90 tablet 3   methocarbamol (ROBAXIN) 500 MG tablet Take 1 tablet (500 mg total) by mouth every 6 (six) hours as needed for muscle spasms. 40 tablet 0   moxifloxacin (VIGAMOX) 0.5 % ophthalmic solution Instill 1 drop in the RIGHT eye 4 times a day. Start the drops after you arrive home from surgery. 3 mL 1   omeprazole (PRILOSEC) 20 MG capsule Take 1 capsule (20 mg total) by mouth 2 (two) times daily for 14 days, THEN 1 capsule (20 mg total) daily for 14 days, THEN 1 capsule (20 mg total) daily. (Patient taking differently: Take 20 mg by mouth once daily) 72 capsule 0   omeprazole (PRILOSEC) 20 MG capsule Take 1 capsule (20 mg total) by mouth daily. 90 capsule 3   ondansetron (ZOFRAN) 4 MG tablet Take 1 tablet (4 mg total) by mouth every 6 (six) hours as needed for nausea. 20 tablet 0   oxyCODONE (OXY IR/ROXICODONE) 5 MG immediate release tablet Take 1-2 tablets (5-10 mg total) by mouth every 6 (six) hours as needed for severe pain (pain score 7-10). 42 tablet 0   prednisoLONE acetate (PRED FORTE) 1 % ophthalmic suspension Instill 1 drop in the Right eye 4 times a day. Start the drops after you arrive home from surgery. 5 mL 2   Semaglutide,0.25 or 0.5MG /DOS, (OZEMPIC, 0.25 OR 0.5 MG/DOSE,) 2 MG/3ML SOPN Inject 0.5 mg into the skin once a week. 9 mL 3   spironolactone (ALDACTONE) 50 MG tablet Take 1 tablet (50 mg total) by mouth daily. 90 tablet 3   traMADol (ULTRAM) 50 MG  tablet Take 1 tablet (50 mg total) by mouth every 6 (six) hours as needed. 100 tablet 2   verapamil (VERELAN) 240 MG 24 hr capsule Take 1 capsule (240 mg total) by mouth at bedtime. 90 capsule 3   No current facility-administered medications on file prior to visit.   Allergies  Allergen Reactions   Atorvastatin     cramps   Hydrochlorothiazide     Cramps   Pravastatin     cramps   Family History  Problem Relation Age of Onset   Hypertension Mother    Diabetes Mother    Hypertension Father    Hypertension Other    Colon cancer Neg Hx    Esophageal cancer Neg Hx    Rectal cancer Neg Hx    Stomach cancer Neg Hx    PE: BP 120/70   Pulse 73   Ht 5' 4.5" (1.638 m)   Wt 188 lb 6.4 oz (85.5 kg)   SpO2 98%   BMI 31.84 kg/m    Wt Readings from Last 10 Encounters:  05/14/23 188 lb 6.4 oz (85.5 kg)  03/12/23 194 lb (88 kg)  12/20/22 193 lb (87.5 kg)  11/13/22 184 lb (83.5 kg)  10/05/22 186 lb (84.4 kg)  10/01/22 186 lb (84.4 kg)  09/19/22 184 lb (83.5 kg)  06/19/22 182 lb (82.6 kg)  05/11/22 186 lb (84.4 kg)  04/18/22 183 lb (83 kg)   Constitutional: overweight, in NAD Eyes: EOMI, no exophthalmos ENT:  no thyromegaly, no cervical lymphadenopathy Cardiovascular: RRR, No MRG, + mild peri ankle swelling bilaterally, pitting Respiratory: CTA B Musculoskeletal: no deformities Skin:  no rashes  Neurological: + tremor with outstretched hands Diabetic Foot Exam - Simple   Simple Foot Form Diabetic Foot exam was performed with the following findings: Yes 05/14/2023 10:10 AM  Visual Inspection No deformities, no ulcerations, no  other skin breakdown bilaterally: Yes Sensation Testing Intact to touch and monofilament testing bilaterally: Yes Pulse Check Posterior Tibialis and Dorsalis pulse intact bilaterally: Yes Comments    Assessment: 1. 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 Hyperlipidemia - Developed leg cramps with pravastatin,  atorvastatin, Crestor, fluvastatin  3.  Obesity class I  4. Goiter by palpation 11/04/2012: Thyroid U/S: Right thyroid lobe: 4.3 x 1.0 x 1.8 cm  Left thyroid lobe: 4.6 x 1.2 x 1.9 cm  Isthmus: 5 mm  Focal nodules: Thyroid echotexture is homogeneous, without solid or cystic lesion.  Lymphadenopathy: None visualized.  IMPRESSION:  Normal thyroid ultrasound. No evidence of thyromegaly or dominant solid/cystic mass.  -No further intervention needed for this  PLAN:  1. DM2 -Patient with longstanding, well-controlled, type 2 diabetes, on SGLT inhibitor, and weekly GLP-1 receptor agonist, changed from a DPP 4 inhibitor.  HbA1c improved after starting Ozempic and it was 6.0% at last check in 02/2023. -At last visit sugars were excellent in the morning and but she was not checking at all later in the day.  We again discussed about trying to occasionally check later in the day but I also recommended to decrease the metformin dose to only 1 tablet a day and continue the rest of the regimen. -At today's visit, sugars are all at goal in the morning and this time she also had some blood sugars checked after dinner and they were excellent.  She is off metformin now, initially stopped before her total knee replacement but she did not restart it afterwards.  Sugars remained at goal so for now I did not recommend to restart it.  We will continue Jardiance and Ozempic. - I suggested to:  Patient Instructions  Please continue: - Jardiance 10 mg in am - Ozempic 0.5 mg weekly  Please come back for a follow-up appointment in 6 months.  - we checked her HbA1c: 6.1% (slightly higher) - advised to check sugars at different times of the day - 1x a day, rotating check times - advised for yearly eye exams >> she is UTD - return to clinic in 6 months  2. Hyperlipidemia -Latest lipid panel was reviewed from 12/2021: Fractions at goal with exception of an LDL above target: Lab Results  Component Value Date    CHOL 155 12/18/2021   HDL 56.80 12/18/2021   LDLCALC 86 12/18/2021   LDLDIRECT 146.5 09/18/2012   TRIG 62.0 12/18/2021   CHOLHDL 3 12/18/2021  -She had leg cramps from low-dose Crestor, pravastatin, atorvastatin, fluvastatin XL.  However, afterwards she was able to tolerate Lipitor 10 mg every other day.  3.  Obesity class I -Will continue to begin Jardiance which should both with weight loss -Weight was approximately stable at last visit, but previously lost 17 pounds after starting Ozempic. -She gained 4 pounds since last visit, possibly due to being less in the setting of knee replacement  Carlus Pavlov, MD PhD [facility]

## 2023-05-14 NOTE — Therapy (Signed)
 OUTPATIENT PHYSICAL THERAPY LOWER EXTREMITY TREATMENT   Patient Name: Katelyn Peterson MRN: [id number] DOB:08-Jul-1945, 78 y.o., female Today's Date: 05/14/2023  END OF SESSION:  PT End of Session - 05/14/23 1710     Visit Number 15    Date for PT Re-Evaluation 06/28/23    Authorization Type [facility] [employer]    PT Start Time 1445    PT Stop Time 1548    PT Time Calculation (min) 63 min    Activity Tolerance Patient tolerated treatment well    Behavior During Therapy WFL for tasks assessed/performed                Past Medical History:  Diagnosis Date   Anemia    Arthritis    HTN (hypertension)    Hyperlipidemia    Obesity    Type II or unspecified type diabetes mellitus without mention of complication, not stated as uncontrolled    Past Surgical History:  Procedure Laterality Date   ABDOMINAL HYSTERECTOMY  1985   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   Procedure: TOTAL KNEE ARTHROPLASTY;  Surgeon: Ollen Gross, MD;  Location: [facility];  Service: Orthopedics;  Laterality: Right;   Patient Active Problem List   Diagnosis Date Noted   OA (osteoarthritis) of knee 03/18/2023   Primary osteoarthritis of right knee 03/18/2023   Preop exam for internal medicine 12/20/2022   Nuclear sclerotic cataract of right eye 12/07/2022   B12 deficiency 06/20/2022   Colon polyps 06/19/2022   Arthritis of left foot 04/11/2021   CRI (chronic renal insufficiency), stage 3 (moderate) (HCC) 12/15/2020   Wrist pain, left 12/10/2018   Knee pain, right 06/19/2017   Anemia 05/08/2016   Onychomycosis 06/22/2015   Arthritis of ankle 04/01/2015   Well adult exam 08/16/2014   Goiter 11/04/2012   Left ankle pain 08/22/2011   Grief 04/20/2011   Rhinitis 04/20/2011   ABDOMINAL DISTENSION 03/09/2010   KNEE PAIN 03/04/2008   LEG CRAMPS 09/03/2007   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) 04/11/2007   Obesity 04/11/2007   Dyslipidemia associated  with type 2 diabetes mellitus (HCC) 11/26/2006   Essential hypertension 11/26/2006    PCP: Tresa Garter, MD   REFERRING PROVIDER: Derenda Fennel, PA  REFERRING DIAG: M17.11 (ICD-10-CM) - Unilateral primary osteoarthritis, right knee  THERAPY DIAG:  Stiffness of right knee, not elsewhere classified  Chronic pain of right knee  Difficulty in walking, not elsewhere classified  Cramp and spasm  Localized edema  Muscle weakness (generalized)  Rationale for Evaluation and Treatment: Rehabilitation  ONSET DATE: 03/18/2023  SUBJECTIVE:   SUBJECTIVE STATEMENT: Patient reports she is doing good today.   From Eval: Patient presents post Rt TKA on 1/6. She has been walking once a day for 10 minutes. She was not given any exercises. She has increased pain with functional movements (sit to stand, getting in and out of a car, etc). She does not have any pain if her Rt knee if it is extended. She wants to get back to walking normally when running errands.  PERTINENT HISTORY: HTN, Anemia; Type II diabetes   PAIN: 05/14/2023 Are you having pain? Yes: NPRS scale: 0/10 Pain location: Rt leg Pain description: achy dull Aggravating factors: getting out car; sitting down with knee flexed Relieving factors: Pain medication  PRECAUTIONS: Other: Status post Rt TKA 03/18/2023  RED FLAGS: None   WEIGHT BEARING RESTRICTIONS: No  FALLS:  Has patient fallen in last 6 months? No  LIVING ENVIRONMENT: Lives with: lives with their family Lives in: House/apartment Stairs: Yes: Internal: 1 steps; none Has following equipment at home: Dan Humphreys - 2 wheeled and shower chair  OCCUPATION: Psychologist, sport and exercise at [employer]  PLOF: Independent and Independent with basic ADLs  PATIENT GOALS: To get the pain gone  NEXT MD VISIT: 04/02/2023  OBJECTIVE:  Note: Objective measures were completed at Evaluation unless otherwise noted.   PATIENT SURVEYS:  FOTO 31; goal 55  COGNITION: Overall cognitive  status: Within functional limits for tasks assessed     SENSATION: Rt knee Numbness   EDEMA:  Rt knee 3 cm above patella: 54.5cm 3 cam below patella:51cm At patella : 51.5 cm   POSTURE: rounded shoulders and forward head   LOWER EXTREMITY ROM: seated in wheelchair  Active ROM Right eval 03/14/2023  04/16/2023 Left eval  Hip flexion      Hip extension      Hip abduction      Hip adduction      Hip internal rotation      Hip external rotation      Knee flexion 56 AAROM 90 (semi-reclined) PROM 95    Knee extension -2 -15 -5   Ankle dorsiflexion      Ankle plantarflexion      Ankle inversion      Ankle eversion       (Blank rows = not tested)  LOWER EXTREMITY MMT: Deferred      FUNCTIONAL TESTS:  5 times sit to stand: 24.11 with Rt knee extended & UE support 03/26/2023 TUG: 42.59 03/27/2023  with front wheeled walker 52ft  04/18/2023 3MWT:358ft  05/14/2023 5STS: 10.09 sec with UE support TUG: 11.69 sec with straight cane : 596FT str cane  GAIT: Patient did not ambulate during treatment session due to increased pain. Patient required max assistance with car transfer                                                                                                                                 TREATMENT DATE:  05/14/2023 Recumbent Bike Level 1- 8 mins PT present to discuss status (full revolutions) 5STS 10.09sec TUG: 11.69 596FT str cane Seated LAQ x 20 3# AW bilateral  Sit to stand from standard chair x 10 7# KB hold Sit to stand staggered (Lt infront) x 10 7# KB hold 6 inch step ups 2 x 10 bilateral bilateral fwd  Lunges to balance pad fwd x 10 then lateral x 10 Seated knee flexion with slider and red loop 2 x 10 on Rt  Hamstring stretch on 2nd stair 2 x 30 sec Gastroc stretch on rockerboard 2 x 30 sec  Vasopneumatic compression Rt knee moderate pressure x 15 minutes     05/09/2023 Recumbent Bike Level 1- 7 mins PT present to discuss status  (full revolutions) Gait training with no cane x 100 feet- emphasis on heel strike Sit to stand from  plinth x 10 5# KB hold Sit to stand staggered (Lt infront) x 10 5# KB hold Seated LAQ x 20 3# AW Hurdles (forwards & sideways) reciprocal with forward pattern x 4 each Mini squats on balance pad x 20 TKE with blue tband (PT anchoring) x 20 6 inch step ups x 12 bilateral fwd and lateral Lunges to balance pad fwd x 10 then lateral x 10 Supine SLR 2 x 10 Rt  Vasopneumatic compression Rt knee moderate pressure x 15 minutes    05/07/2023 Recumbent Bike Level 1- 7 mins PT present to discuss status (full revolutions) Gait training with straight cane x 100 feet , no cane x 100 feet Sit to stand from plinth x 10 Sit to stand staggered (Lt infront) x 10 Seated LAQ x 20 Educated on need to prop for extension and avoid sitting with knee in flexed position for long periods of time: returning to work and things to consider with positioning and that she will need to continue to consider her knee is still healing and she will need to be mindful of this if she returns to work.  Hurdles (forwards & sideways) reciprocal with forward pattern x 4 each Standing marching, and mini squats on balance pad x 20 TKE with blue tband (PT anchoring) x 20 6 inch step ups x 10 bilateral fwd and lateral Lunges to balance pad fwd x 10 then lateral x 10 Vasopneumatic compression Rt knee moderate pressure x 15 minutes       PATIENT EDUCATION:  Education details: HEP; POC Person educated: Patient Education method: Explanation, Demonstration, and Handouts Education comprehension: verbalized understanding, returned demonstration, and needs further education  HOME EXERCISE PROGRAM: Access Code: [email or web address] URL: [email or web address] Date: 04/09/2023 Prepared by: Claude Manges  Exercises - Seated Heel Slide  - 1 x daily - 7 x weekly - 2 sets - 10 reps - Sitting Heel Slide with Towel  - 1 x daily - 7 x  weekly - 2 sets - 10 reps - Supine Quad Set  - 1 x daily - 7 x weekly - 2 sets - 10 reps - 2-3 hold - Active Straight Leg Raise with Quad Set  - 1 x daily - 7 x weekly - 2 sets - 10 reps - Seated Hamstring Stretch  - 1 x daily - 7 x weekly - 2 sets - 10 reps - Standing March with Counter Support  - 1 x daily - 7 x weekly - 1 sets - 10 reps - Standing Hip Abduction with Counter Support  - 1 x daily - 7 x weekly - 1 sets - 10 reps - Supine Knee Extension Stretch on Towel Roll  - 1 x daily - 7 x weekly - 2 sets - 10 reps - Seated Knee Extension Stretch with Chair  - 1 x daily - 7 x weekly - 2 sets - 30 hold  ASSESSMENT:  CLINICAL IMPRESSION: Re-administered 5STS and TUG today and noted great improvements in patient's scores. Patient in progressing appropriately post total knee replacement. Full knee extension during ambulation remains to be patient's biggest deficit. Patient has plans to return to work march 10th. Patient will benefit from skilled PT to address the below impairments and improve overall function.   OBJECTIVE IMPAIRMENTS: Abnormal gait, decreased mobility, difficulty walking, decreased ROM, decreased strength, increased edema, increased muscle spasms, impaired flexibility, postural dysfunction, obesity, and pain.   ACTIVITY LIMITATIONS: lifting, standing, squatting, stairs, transfers, and bed mobility  PARTICIPATION LIMITATIONS: cleaning, laundry, driving, shopping,  community activity, and occupation  PERSONAL FACTORS: Fitness and 1-2 comorbidities: HTN, Anemia; Type II diabetes   are also affecting patient's functional outcome.   REHAB POTENTIAL: Good  CLINICAL DECISION MAKING: Stable/uncomplicated  EVALUATION COMPLEXITY: Low   GOALS: Goals reviewed with patient? Yes  SHORT TERM GOALS: Target date: 05/02/2023  Patient will be independent with initial HEP. Baseline:  Goal status: MET 04/18/2023  2.  Patient will be able to complete a car transfer with < or = to 2/10  knee pain. Baseline:  Goal status: INITIAL  3.  Patient will demonstrate > or = to 90 degrees of knee flexion for improved sit to stand transfer. Baseline:  Goal status: MET 04/18/2023    LONG TERM GOALS: Target date: 06/28/2023 Patient will demonstrate independence in advanced HEP. Baseline:  Goal status: INITIAL  2.  Patient will be able to ascend a curb/ step for safe community negotiation. Baseline:  Goal status: MET 05/07/23  3.  Patient will be able to return to work and complete all work duties. Baseline:  Goal status: INITIAL  4.  Patient will score < or = to 19 sec on 5 STS for improved functional mobility. Baseline:  Goal status: MET 05/14/2023  5.  Patient will score > or = to 61 on FOTO for improved function.  Baseline:  Goal status: INITIAL  6.  Patient will be able to walk in the community and run errands with < or = to 2/10 knee pain. Baseline:  Goal status: INITIAL   PLAN:  PT FREQUENCY: 2x/week  PT DURATION: 12 weeks  PLANNED INTERVENTIONS: 97164- PT Re-evaluation, 97110-Therapeutic exercises, 97530- Therapeutic activity, 97112- Neuromuscular re-education, 97535- Self Care, 16109- Manual therapy, [phone]- Gait training, [phone]- Aquatic Therapy, 97014- Electrical stimulation (unattended), [id number]- Electrical stimulation (manual), [id number]- Vasopneumatic device, [id number]- Ultrasound, [id number]- Traction (mechanical), [id number]- Ionotophoresis 4mg /ml Dexamethasone, Patient/Family education, Balance training, Stair training, Taping, Dry Needling, Joint mobilization, Joint manipulation, Spinal manipulation, Spinal mobilization, Scar mobilization, Vestibular training, Cryotherapy, and Moist heat  PLAN FOR NEXT SESSION: continue functional strengthening & Rt knee flexion mobility  Claude Manges, PT 05/14/23 5:12 PM [facility] [address] [place], [place] [address] Phone # [phone] Fax [phone]

## 2023-05-16 ENCOUNTER — Ambulatory Visit: Admitting: Physical Therapy

## 2023-05-16 ENCOUNTER — Ambulatory Visit: Payer: Commercial Managed Care - PPO | Admitting: Physical Therapy

## 2023-05-16 DIAGNOSIS — R252 Cramp and spasm: Secondary | ICD-10-CM | POA: Diagnosis not present

## 2023-05-16 DIAGNOSIS — M25661 Stiffness of right knee, not elsewhere classified: Secondary | ICD-10-CM

## 2023-05-16 DIAGNOSIS — R262 Difficulty in walking, not elsewhere classified: Secondary | ICD-10-CM

## 2023-05-16 DIAGNOSIS — R600 Localized edema: Secondary | ICD-10-CM | POA: Diagnosis not present

## 2023-05-16 DIAGNOSIS — G8929 Other chronic pain: Secondary | ICD-10-CM | POA: Diagnosis not present

## 2023-05-16 DIAGNOSIS — M6281 Muscle weakness (generalized): Secondary | ICD-10-CM | POA: Diagnosis not present

## 2023-05-16 DIAGNOSIS — M25561 Pain in right knee: Secondary | ICD-10-CM | POA: Diagnosis not present

## 2023-05-16 NOTE — Therapy (Signed)
 OUTPATIENT PHYSICAL THERAPY LOWER EXTREMITY TREATMENT   Patient Name: Katelyn Peterson MRN: [id number] DOB:Nov 24, 1945, 78 y.o., female Today's Date: 05/16/2023  END OF SESSION:  PT End of Session - 05/16/23 1625     Visit Number 16    Date for PT Re-Evaluation 06/28/23    Authorization Type [facility] [employer]    PT Start Time 0330    PT Stop Time [phone]    PT Time Calculation (min) 45 min    Activity Tolerance Patient tolerated treatment well    Behavior During Therapy WFL for tasks assessed/performed                 Past Medical History:  Diagnosis Date   Anemia    Arthritis    HTN (hypertension)    Hyperlipidemia    Obesity    Type II or unspecified type diabetes mellitus without mention of complication, not stated as uncontrolled    Past Surgical History:  Procedure Laterality Date   ABDOMINAL HYSTERECTOMY  1985   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   Procedure: TOTAL KNEE ARTHROPLASTY;  Surgeon: Ollen Gross, MD;  Location: [facility];  Service: Orthopedics;  Laterality: Right;   Patient Active Problem List   Diagnosis Date Noted   OA (osteoarthritis) of knee 03/18/2023   Primary osteoarthritis of right knee 03/18/2023   Preop exam for internal medicine 12/20/2022   Nuclear sclerotic cataract of right eye 12/07/2022   B12 deficiency 06/20/2022   Colon polyps 06/19/2022   Arthritis of left foot 04/11/2021   CRI (chronic renal insufficiency), stage 3 (moderate) (HCC) 12/15/2020   Wrist pain, left 12/10/2018   Knee pain, right 06/19/2017   Anemia 05/08/2016   Onychomycosis 06/22/2015   Arthritis of ankle 04/01/2015   Well adult exam 08/16/2014   Goiter 11/04/2012   Left ankle pain 08/22/2011   Grief 04/20/2011   Rhinitis 04/20/2011   ABDOMINAL DISTENSION 03/09/2010   KNEE PAIN 03/04/2008   LEG CRAMPS 09/03/2007   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 No new pain to report, she has also been getting in and out of her SUV with no issues.    From Eval: Patient presents post Rt TKA on 1/6. She has been walking once a day for 10 minutes. She was not given any exercises. She has increased pain with functional movements (sit to stand, getting in and out of a car, etc). She does not have any pain if her Rt knee if it is extended. She wants to get back to walking normally when running errands.  PERTINENT HISTORY: HTN, Anemia; Type II diabetes   PAIN: 05/14/2023 Are you having pain? Yes: NPRS scale: 0/10 Pain location: Rt leg Pain description: achy dull Aggravating factors: getting out car; sitting down with knee flexed Relieving factors: Pain medication  PRECAUTIONS: Other: Status post Rt TKA 03/18/2023  RED FLAGS: None   WEIGHT BEARING RESTRICTIONS: No  FALLS:  Has patient fallen in last 6 months? No  LIVING ENVIRONMENT: Lives with: lives with their family Lives in: House/apartment Stairs: Yes: Internal: 1 steps; none Has following equipment at home: Dan Humphreys - 2 wheeled and shower chair  OCCUPATION: Psychologist, sport and exercise at [employer]  PLOF: Independent and Independent with basic ADLs  PATIENT GOALS: To get the pain gone  NEXT MD VISIT: 04/02/2023  OBJECTIVE:  Note: Objective measures were completed at Evaluation unless otherwise noted.   PATIENT SURVEYS:  FOTO 31; goal  33  COGNITION: Overall cognitive status: Within functional limits for tasks assessed     SENSATION: Rt knee Numbness   EDEMA:  Rt knee 3 cm above patella: 54.5cm 3 cam below patella:51cm At patella : 51.5 cm   POSTURE: rounded shoulders and forward head   LOWER EXTREMITY ROM: seated in wheelchair  Active ROM Right eval 03/14/2023  04/16/2023 Left eval  Hip flexion      Hip extension      Hip abduction      Hip adduction      Hip internal rotation      Hip external rotation      Knee flexion 56 AAROM 90 (semi-reclined) PROM 95    Knee extension -2 -15 -5   Ankle dorsiflexion      Ankle plantarflexion      Ankle inversion      Ankle eversion       (Blank rows = not tested)  LOWER EXTREMITY MMT: Deferred      FUNCTIONAL TESTS:  5 times sit to stand: 24.11 with Rt knee extended & UE support 03/26/2023 TUG: 42.59 03/27/2023  with front wheeled walker 74ft  04/18/2023 3MWT:327ft  05/14/2023 5STS: 10.09 sec with UE support TUG: 11.69 sec with straight cane : 596FT str cane  GAIT: Patient did not ambulate during treatment session due to increased pain. Patient required max assistance with car transfer                                                                                                                              TREATMENT DATE:  05/16/2023 Recumbent Bike Level 1- 8 mins PT present to discuss status (full revolutions) Seated LAQ x 20 4# AW bilateral  Sit to stand from standard chair 2x10 8# DB hold Sit to stand staggered (Lt infront) 2x10 8# KB hold 4 step climb reciprocal gait pattern, 3 rounds  Lateral step ups onto 6" box 2x10 each side  Lunges to balance pad fwd x 10 then lateral x 10 Seated knee flexion with slider and red loop 2 x 10 on Rt  Hamstring stretch on 2nd stair 2 x 30 sec Gastroc stretch on rockerboard 2 x 30 sec  Vasopneumatic compression Rt knee moderate pressure x 15 minutes  05/14/2023 Recumbent Bike Level 1- 8 mins PT  present to discuss status (full revolutions) 5STS 10.09sec TUG: 11.69 596FT str cane Seated LAQ x 20 3#  AW bilateral  Sit to stand from standard chair x 10 7# KB hold Sit to stand staggered (Lt infront) x 10 7# KB hold 6 inch step ups 2 x 10 bilateral bilateral fwd  Lunges to balance pad fwd x 10 then lateral x 10 Seated knee flexion with slider and red loop 2 x 10 on Rt  Hamstring stretch on 2nd stair 2 x 30 sec Gastroc stretch on rockerboard 2 x 30 sec  Vasopneumatic compression Rt knee moderate pressure x 15 minutes  05/09/2023 Recumbent Bike Level 1- 7 mins PT present to discuss status (full revolutions) Gait training with no cane x 100 feet- emphasis on heel strike Sit to stand from plinth x 10 5# KB hold Sit to stand staggered (Lt infront) x 10 5# KB hold Seated LAQ x 20 3# AW Hurdles (forwards & sideways) reciprocal with forward pattern x 4 each Mini squats on balance pad x 20 TKE with blue tband (PT anchoring) x 20 6 inch step ups x 12 bilateral fwd and lateral Lunges to balance pad fwd x 10 then lateral x 10 Supine SLR 2 x 10 Rt  Vasopneumatic compression Rt knee moderate pressure x 15 minutes  PATIENT EDUCATION:  Education details: HEP; POC Person educated: Patient Education method: Programmer, multimedia, Demonstration, and Handouts Education comprehension: verbalized understanding, returned demonstration, and needs further education  HOME EXERCISE PROGRAM: Access Code: [email or web address] URL: [email or web address] Date: 04/09/2023 Prepared by: Claude Manges  Exercises - Seated Heel Slide  - 1 x daily - 7 x weekly - 2 sets - 10 reps - Sitting Heel Slide with Towel  - 1 x daily - 7 x weekly - 2 sets - 10 reps - Supine Quad Set  - 1 x daily - 7 x weekly - 2 sets - 10 reps - 2-3 hold - Active Straight Leg Raise with Quad Set  - 1 x daily - 7 x weekly - 2 sets - 10 reps - Seated Hamstring Stretch  - 1 x daily - 7 x weekly - 2 sets - 10 reps - Standing March with  Counter Support  - 1 x daily - 7 x weekly - 1 sets - 10 reps - Standing Hip Abduction with Counter Support  - 1 x daily - 7 x weekly - 1 sets - 10 reps - Supine Knee Extension Stretch on Towel Roll  - 1 x daily - 7 x weekly - 2 sets - 10 reps - Seated Knee Extension Stretch with Chair  - 1 x daily - 7 x weekly - 2 sets - 30 hold  ASSESSMENT:  CLINICAL IMPRESSION: Progressed all exercises in repetition count or weight today with little to no difficulty. Patient was unable to complete 8" step up without balance difficulty, so we continued with 6" step. Full knee extension during ambulation remains to be patient's biggest deficit. Patient has plans to return to work march 10th, which she is feeling confident about returning to. Patient will benefit from skilled PT to address the below impairments and improve overall function.  OBJECTIVE IMPAIRMENTS: Abnormal gait, decreased mobility, difficulty walking, decreased ROM, decreased strength, increased edema, increased muscle spasms, impaired flexibility, postural dysfunction, obesity, and pain.   ACTIVITY LIMITATIONS: lifting, standing, squatting, stairs, transfers, and bed mobility  PARTICIPATION LIMITATIONS: cleaning, laundry, driving, shopping, community activity, and occupation  PERSONAL FACTORS: Fitness and 1-2 comorbidities: HTN, Anemia; Type II diabetes   are also affecting patient's functional outcome.   REHAB POTENTIAL: Good  CLINICAL DECISION MAKING: Stable/uncomplicated  EVALUATION COMPLEXITY: Low   GOALS: Goals reviewed with patient? Yes  SHORT TERM GOALS: Target date: 05/02/2023  Patient will be independent with initial HEP. Baseline:  Goal status: MET 04/18/2023  2.  Patient will be able to complete a car transfer with < or = to 2/10 knee pain. Baseline:  Goal status: MET 05/16/23  3.  Patient will demonstrate > or = to 90 degrees of knee flexion for improved sit to stand transfer. Baseline:  Goal status: MET  04/18/2023    LONG TERM GOALS: Target date: 06/28/2023 Patient will demonstrate independence in advanced HEP. Baseline:  Goal status: INITIAL  2.  Patient will be able to ascend a curb/ step for safe community negotiation. Baseline:  Goal status: MET 05/07/23  3.  Patient will be able to return to work and complete all work duties. Baseline:  Goal status: INITIAL  4.  Patient will score < or = to 19 sec on 5 STS for improved functional mobility. Baseline:  Goal status: MET 05/14/2023  5.  Patient will score > or = to 61 on FOTO for improved function.  Baseline:  Goal status: INITIAL  6.  Patient will be able to walk in the community and run errands with < or = to 2/10 knee pain. Baseline:  Goal status: INITIAL   PLAN:  PT FREQUENCY: 2x/week  PT DURATION: 12 weeks  PLANNED INTERVENTIONS: 97164- PT Re-evaluation, 97110-Therapeutic exercises, 97530- Therapeutic activity, [id number]- Neuromuscular re-education, 97535- Self Care, 16109- Manual therapy, [id number]- Gait training, [phone]- Aquatic Therapy, 97014- Electrical stimulation (unattended), [phone]- Electrical stimulation (manual), [id number]- Vasopneumatic device, [id number]- Ultrasound, [id number]- Traction (mechanical), [id number]- Ionotophoresis 4mg /ml Dexamethasone, Patient/Family education, Balance training, Stair training, Taping, Dry Needling, Joint mobilization, Joint manipulation, Spinal manipulation, Spinal mobilization, Scar mobilization, Vestibular training, Cryotherapy, and Moist heat  PLAN FOR NEXT SESSION: continue functional strengthening & Rt knee flexion mobility  Earna Coder, PT, DPT 05/16/23 4:36 PM   [facility] [address], [address] [place], [place] [address] Phone # [phone] Fax [phone]

## 2023-05-21 ENCOUNTER — Ambulatory Visit: Payer: Commercial Managed Care - PPO

## 2023-05-21 DIAGNOSIS — R252 Cramp and spasm: Secondary | ICD-10-CM

## 2023-05-21 DIAGNOSIS — R262 Difficulty in walking, not elsewhere classified: Secondary | ICD-10-CM

## 2023-05-21 DIAGNOSIS — M6281 Muscle weakness (generalized): Secondary | ICD-10-CM | POA: Diagnosis not present

## 2023-05-21 DIAGNOSIS — M25661 Stiffness of right knee, not elsewhere classified: Secondary | ICD-10-CM | POA: Diagnosis not present

## 2023-05-21 DIAGNOSIS — G8929 Other chronic pain: Secondary | ICD-10-CM

## 2023-05-21 DIAGNOSIS — M25561 Pain in right knee: Secondary | ICD-10-CM | POA: Diagnosis not present

## 2023-05-21 DIAGNOSIS — R600 Localized edema: Secondary | ICD-10-CM | POA: Diagnosis not present

## 2023-05-21 NOTE — Therapy (Signed)
 OUTPATIENT PHYSICAL THERAPY LOWER EXTREMITY TREATMENT   Patient Name: Katelyn Peterson MRN: [id number] DOB:04-25-1945, 78 y.o., female Today's Date: 05/21/2023  END OF SESSION:  PT End of Session - 05/21/23 1503     Visit Number 17    Date for PT Re-Evaluation 06/28/23    Authorization Type [facility] [employer]    PT Start Time [phone]    PT Stop Time 1538    PT Time Calculation (min) 50 min    Activity Tolerance Patient tolerated treatment well    Behavior During Therapy WFL for tasks assessed/performed                 Past Medical History:  Diagnosis Date   Anemia    Arthritis    HTN (hypertension)    Hyperlipidemia    Obesity    Type II or unspecified type diabetes mellitus without mention of complication, not stated as uncontrolled    Past Surgical History:  Procedure Laterality Date   ABDOMINAL HYSTERECTOMY  1985   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   Procedure: TOTAL KNEE ARTHROPLASTY;  Surgeon: Ollen Gross, MD;  Location: [facility];  Service: Orthopedics;  Laterality: Right;   Patient Active Problem List   Diagnosis Date Noted   OA (osteoarthritis) of knee 03/18/2023   Primary osteoarthritis of right knee 03/18/2023   Preop exam for internal medicine 12/20/2022   Nuclear sclerotic cataract of right eye 12/07/2022   B12 deficiency 06/20/2022   Colon polyps 06/19/2022   Arthritis of left foot 04/11/2021   CRI (chronic renal insufficiency), stage 3 (moderate) (HCC) 12/15/2020   Wrist pain, left 12/10/2018   Knee pain, right 06/19/2017   Anemia 05/08/2016   Onychomycosis 06/22/2015   Arthritis of ankle 04/01/2015   Well adult exam 08/16/2014   Goiter 11/04/2012   Left ankle pain 08/22/2011   Grief 04/20/2011   Rhinitis 04/20/2011   ABDOMINAL DISTENSION 03/09/2010   KNEE PAIN 03/04/2008   LEG CRAMPS 09/03/2007   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) 04/11/2007   Obesity 04/11/2007   Dyslipidemia  associated with type 2 diabetes mellitus (HCC) 11/26/2006   Essential hypertension 11/26/2006    PCP: Tresa Garter, MD   REFERRING PROVIDER: Derenda Fennel, PA  REFERRING DIAG: M17.11 (ICD-10-CM) - Unilateral primary osteoarthritis, right knee  THERAPY DIAG:  Stiffness of right knee, not elsewhere classified  Chronic pain of right knee  Difficulty in walking, not elsewhere classified  Cramp and spasm  Muscle weakness (generalized)  Rationale for Evaluation and Treatment: Rehabilitation  ONSET DATE: 03/18/2023  SUBJECTIVE:   SUBJECTIVE STATEMENT: Patient reports she is doing good today. No new pain to report, she has also been getting in and out of her SUV with no issues.    From Eval: Patient presents post Rt TKA on 1/6. She has been walking once a day for 10 minutes. She was not given any exercises. She has increased pain with functional movements (sit to stand, getting in and out of a car, etc). She does not have any pain if her Rt knee if it is extended. She wants to get back to walking normally when running errands.  PERTINENT HISTORY: HTN, Anemia; Type II diabetes   PAIN: 05/14/2023 Are you having pain? Yes: NPRS scale: 0/10 Pain location: Rt leg Pain description: achy dull Aggravating factors: getting out car; sitting down with knee flexed Relieving factors: Pain medication  PRECAUTIONS: Other: Status post Rt TKA 03/18/2023  RED FLAGS:  None   WEIGHT BEARING RESTRICTIONS: No  FALLS:  Has patient fallen in last 6 months? No  LIVING ENVIRONMENT: Lives with: lives with their family Lives in: House/apartment Stairs: Yes: Internal: 1 steps; none Has following equipment at home: Dan Humphreys - 2 wheeled and shower chair  OCCUPATION: Psychologist, sport and exercise at [employer]  PLOF: Independent and Independent with basic ADLs  PATIENT GOALS: To get the pain gone  NEXT MD VISIT: 04/02/2023  OBJECTIVE:  Note: Objective measures were completed at Evaluation unless otherwise  noted.   PATIENT SURVEYS:  FOTO 31; goal 9  COGNITION: Overall cognitive status: Within functional limits for tasks assessed     SENSATION: Rt knee Numbness   EDEMA:  Rt knee 3 cm above patella: 54.5cm 3 cam below patella:51cm At patella : 51.5 cm   POSTURE: rounded shoulders and forward head   LOWER EXTREMITY ROM: seated in wheelchair  Active ROM Right eval 03/14/2023  04/16/2023 Left eval  Hip flexion      Hip extension      Hip abduction      Hip adduction      Hip internal rotation      Hip external rotation      Knee flexion 56 AAROM 90 (semi-reclined) PROM 95    Knee extension -2 -15 -5   Ankle dorsiflexion      Ankle plantarflexion      Ankle inversion      Ankle eversion       (Blank rows = not tested)  LOWER EXTREMITY MMT: Deferred      FUNCTIONAL TESTS:  5 times sit to stand: 24.11 with Rt knee extended & UE support 03/26/2023 TUG: 42.59 03/27/2023  with front wheeled walker 14ft  04/18/2023 3MWT:380ft  05/14/2023 5STS: 10.09 sec with UE support TUG: 11.69 sec with straight cane : 596FT str cane  GAIT: Patient did not ambulate during treatment session due to increased pain. Patient required max assistance with car transfer                                                                                                                              TREATMENT DATE:  05/21/2023 Nustep Level 5 x 8 mins PT present to discuss status (full revolutions) Standing TKE with blue band x 20 Supine quad set x 20 both Supine TKE with blue foam roller x 20 with 4 lbs (right) Supine SAQ with larger blue bolster x20 with 4 lbs (right) Supine SLR with 4 lbs 2 x 10 Supine hip abduction with 4lbs using towel x 20 Supine heel slides with 4 lbs x 20 Seated LAQ x 20 4# right Sit to stand from standard chair 2x10 8# DB hold Sit to stand staggered (Lt infront) 2x10 8# KB hold Vasopneumatic compression Rt knee moderate pressure x 15 minutes (challenged patient  to keep leg out straight vs up on bolster today)   05/16/2023 Recumbent Bike Level 1- 8 mins PT present  to discuss status (full revolutions) Seated LAQ x 20 4# AW bilateral  Sit to stand from standard chair 2x10 8# DB hold Sit to stand staggered (Lt infront) 2x10 8# KB hold 4 step climb reciprocal gait pattern, 3 rounds  Lateral step ups onto 6" box 2x10 each side  Lunges to balance pad fwd x 10 then lateral x 10 Seated knee flexion with slider and red loop 2 x 10 on Rt  Hamstring stretch on 2nd stair 2 x 30 sec Gastroc stretch on rockerboard 2 x 30 sec  Vasopneumatic compression Rt knee moderate pressure x 15 minutes  05/14/2023 Recumbent Bike Level 1- 8 mins PT present to discuss status (full revolutions) 5STS 10.09sec TUG: 11.69 596FT str cane Seated LAQ x 20 3# AW bilateral  Sit to stand from standard chair x 10 7# KB hold Sit to stand staggered (Lt infront) x 10 7# KB hold 6 inch step ups 2 x 10 bilateral bilateral fwd  Lunges to balance pad fwd x 10 then lateral x 10 Seated knee flexion with slider and red loop 2 x 10 on Rt  Hamstring stretch on 2nd stair 2 x 30 sec Gastroc stretch on rockerboard 2 x 30 sec  Vasopneumatic compression Rt knee moderate pressure x 15 minutes   PATIENT EDUCATION:  Education details: HEP; POC Person educated: Patient Education method: Programmer, multimedia, Facilities manager, and Handouts Education comprehension: verbalized understanding, returned demonstration, and needs further education  HOME EXERCISE PROGRAM: Access Code: [email or web address] URL: [email or web address] Date: 04/09/2023 Prepared by: Claude Manges  Exercises - Seated Heel Slide  - 1 x daily - 7 x weekly - 2 sets - 10 reps - Sitting Heel Slide with Towel  - 1 x daily - 7 x weekly - 2 sets - 10 reps - Supine Quad Set  - 1 x daily - 7 x weekly - 2 sets - 10 reps - 2-3 hold - Active Straight Leg Raise with Quad Set  - 1 x daily - 7 x weekly - 2 sets - 10 reps - Seated Hamstring  Stretch  - 1 x daily - 7 x weekly - 2 sets - 10 reps - Standing March with Counter Support  - 1 x daily - 7 x weekly - 1 sets - 10 reps - Standing Hip Abduction with Counter Support  - 1 x daily - 7 x weekly - 1 sets - 10 reps - Supine Knee Extension Stretch on Towel Roll  - 1 x daily - 7 x weekly - 2 sets - 10 reps - Seated Knee Extension Stretch with Chair  - 1 x daily - 7 x weekly - 2 sets - 30 hold  ASSESSMENT:  CLINICAL IMPRESSION: Claris Che is progressing well overall.  She arrived with quite antalgic gait after having worked the last two days.  We discussed importance of propping for extension and moving about when able to avoid excessive stiffness.  Also encouraged use of ice.  Patient seems to be adhering to all of this.  She is compliant and well motivated.  She should continue to do well.   Patient will benefit from skilled PT to address lingering lack of extension and abnormal gait.    OBJECTIVE IMPAIRMENTS: Abnormal gait, decreased mobility, difficulty walking, decreased ROM, decreased strength, increased edema, increased muscle spasms, impaired flexibility, postural dysfunction, obesity, and pain.   ACTIVITY LIMITATIONS: lifting, standing, squatting, stairs, transfers, and bed mobility  PARTICIPATION LIMITATIONS: cleaning, laundry, driving, shopping, community activity, and occupation  PERSONAL FACTORS: Fitness  and 1-2 comorbidities: HTN, Anemia; Type II diabetes   are also affecting patient's functional outcome.   REHAB POTENTIAL: Good  CLINICAL DECISION MAKING: Stable/uncomplicated  EVALUATION COMPLEXITY: Low   GOALS: Goals reviewed with patient? Yes  SHORT TERM GOALS: Target date: 05/02/2023  Patient will be independent with initial HEP. Baseline:  Goal status: MET 04/18/2023  2.  Patient will be able to complete a car transfer with < or = to 2/10 knee pain. Baseline:  Goal status: MET 05/16/23  3.  Patient will demonstrate > or = to 90 degrees of knee flexion for  improved sit to stand transfer. Baseline:  Goal status: MET 04/18/2023    LONG TERM GOALS: Target date: 06/28/2023 Patient will demonstrate independence in advanced HEP. Baseline:  Goal status: INITIAL  2.  Patient will be able to ascend a curb/ step for safe community negotiation. Baseline:  Goal status: MET 05/07/23  3.  Patient will be able to return to work and complete all work duties. Baseline:  Goal status: INITIAL  4.  Patient will score < or = to 19 sec on 5 STS for improved functional mobility. Baseline:  Goal status: MET 05/14/2023  5.  Patient will score > or = to 61 on FOTO for improved function.  Baseline:  Goal status: INITIAL  6.  Patient will be able to walk in the community and run errands with < or = to 2/10 knee pain. Baseline:  Goal status: INITIAL   PLAN:  PT FREQUENCY: 2x/week  PT DURATION: 12 weeks  PLANNED INTERVENTIONS: 97164- PT Re-evaluation, 97110-Therapeutic exercises, 97530- Therapeutic activity, [id number]- Neuromuscular re-education, 97535- Self Care, 25427- Manual therapy, [id number]- Gait training, [phone]- Aquatic Therapy, 97014- Electrical stimulation (unattended), [id number]- Electrical stimulation (manual), [id number]- Vasopneumatic device, [id number]- Ultrasound, [id number]- Traction (mechanical), [id number]- Ionotophoresis 4mg /ml Dexamethasone, Patient/Family education, Balance training, Stair training, Taping, Dry Needling, Joint mobilization, Joint manipulation, Spinal manipulation, Spinal mobilization, Scar mobilization, Vestibular training, Cryotherapy, and Moist heat  PLAN FOR NEXT SESSION: Focus on regaining full extension,  continue functional strengthening & Rt knee flexion mobility  Trini Soldo B. Lama Narayanan, PT 05/21/23 3:30 PM [facility] [address], [address] [place], [place] [address] Phone # [phone] Fax [phone]

## 2023-05-23 ENCOUNTER — Ambulatory Visit: Payer: Commercial Managed Care - PPO

## 2023-05-23 DIAGNOSIS — R262 Difficulty in walking, not elsewhere classified: Secondary | ICD-10-CM | POA: Diagnosis not present

## 2023-05-23 DIAGNOSIS — M25661 Stiffness of right knee, not elsewhere classified: Secondary | ICD-10-CM | POA: Diagnosis not present

## 2023-05-23 DIAGNOSIS — M6281 Muscle weakness (generalized): Secondary | ICD-10-CM

## 2023-05-23 DIAGNOSIS — R252 Cramp and spasm: Secondary | ICD-10-CM | POA: Diagnosis not present

## 2023-05-23 DIAGNOSIS — R600 Localized edema: Secondary | ICD-10-CM | POA: Diagnosis not present

## 2023-05-23 DIAGNOSIS — G8929 Other chronic pain: Secondary | ICD-10-CM

## 2023-05-23 DIAGNOSIS — M25561 Pain in right knee: Secondary | ICD-10-CM | POA: Diagnosis not present

## 2023-05-23 NOTE — Therapy (Signed)
 OUTPATIENT PHYSICAL THERAPY LOWER EXTREMITY TREATMENT   Patient Name: Katelyn Peterson MRN: [id number] DOB:[birth year], 78 y.o., female Today's Date: 05/23/2023  END OF SESSION:  PT End of Session - 05/23/23 1458     Visit Number 18    Date for PT Re-Evaluation 06/28/23    Authorization Type [facility] [employer]    PT Start Time [phone]    PT Stop Time 1540    PT Time Calculation (min) 52 min    Activity Tolerance Patient tolerated treatment well    Behavior During Therapy WFL for tasks assessed/performed                 Past Medical History:  Diagnosis Date   Anemia    Arthritis    HTN (hypertension)    Hyperlipidemia    Obesity    Type II or unspecified type diabetes mellitus without mention of complication, not stated as uncontrolled    Past Surgical History:  Procedure Laterality Date   ABDOMINAL HYSTERECTOMY  1985   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   Procedure: TOTAL KNEE ARTHROPLASTY;  Surgeon: Ollen Gross, MD;  Location: [facility];  Service: Orthopedics;  Laterality: Right;   Patient Active Problem List   Diagnosis Date Noted   OA (osteoarthritis) of knee 03/18/2023   Primary osteoarthritis of right knee 03/18/2023   Preop exam for internal medicine 12/20/2022   Nuclear sclerotic cataract of right eye 12/07/2022   B12 deficiency 06/20/2022   Colon polyps 06/19/2022   Arthritis of left foot 04/11/2021   CRI (chronic renal insufficiency), stage 3 (moderate) (HCC) 12/15/2020   Wrist pain, left 12/10/2018   Knee pain, right 06/19/2017   Anemia 05/08/2016   Onychomycosis 06/22/2015   Arthritis of ankle 04/01/2015   Well adult exam 08/16/2014   Goiter 11/04/2012   Left ankle pain 08/22/2011   Grief 04/20/2011   Rhinitis 04/20/2011   ABDOMINAL DISTENSION 03/09/2010   KNEE PAIN 03/04/2008   LEG CRAMPS 09/03/2007   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) 04/11/2007   Obesity 04/11/2007   Dyslipidemia  associated with type 2 diabetes mellitus (HCC) 11/26/2006   Essential hypertension 11/26/2006    PCP: Tresa Garter, MD   REFERRING PROVIDER: Derenda Fennel, PA  REFERRING DIAG: M17.11 (ICD-10-CM) - Unilateral primary osteoarthritis, right knee  THERAPY DIAG:  Stiffness of right knee, not elsewhere classified  Chronic pain of right knee  Difficulty in walking, not elsewhere classified  Muscle weakness (generalized)  Cramp and spasm  Localized edema  Rationale for Evaluation and Treatment: Rehabilitation  ONSET DATE: 03/18/2023  SUBJECTIVE:   SUBJECTIVE STATEMENT: Patient reports post rest stiffness since returning to work.  Working really hard on her extension.     From Eval: Patient presents post Rt TKA on 1/6. She has been walking once a day for 10 minutes. She was not given any exercises. She has increased pain with functional movements (sit to stand, getting in and out of a car, etc). She does not have any pain if her Rt knee if it is extended. She wants to get back to walking normally when running errands.  PERTINENT HISTORY: HTN, Anemia; Type II diabetes   PAIN: 05/14/2023 Are you having pain? Yes: NPRS scale: 0/10 Pain location: Rt leg Pain description: achy dull Aggravating factors: getting out car; sitting down with knee flexed Relieving factors: Pain medication  PRECAUTIONS: Other: Status post Rt TKA 03/18/2023  RED FLAGS: None   WEIGHT BEARING RESTRICTIONS:  No  FALLS:  Has patient fallen in last 6 months? No  LIVING ENVIRONMENT: Lives with: lives with their family Lives in: House/apartment Stairs: Yes: Internal: 1 steps; none Has following equipment at home: Dan Humphreys - 2 wheeled and shower chair  OCCUPATION: Psychologist, sport and exercise at [employer]  PLOF: Independent and Independent with basic ADLs  PATIENT GOALS: To get the pain gone  NEXT MD VISIT: 04/02/2023  OBJECTIVE:  Note: Objective measures were completed at Evaluation unless otherwise  noted.   PATIENT SURVEYS:  FOTO 31; goal 80  COGNITION: Overall cognitive status: Within functional limits for tasks assessed     SENSATION: Rt knee Numbness   EDEMA:  Rt knee 3 cm above patella: 54.5cm 3 cam below patella:51cm At patella : 51.5 cm   POSTURE: rounded shoulders and forward head   LOWER EXTREMITY ROM: seated in wheelchair  Active ROM Right eval 03/14/2023  04/16/2023 Left eval  Hip flexion      Hip extension      Hip abduction      Hip adduction      Hip internal rotation      Hip external rotation      Knee flexion 56 AAROM 90 (semi-reclined) PROM 95    Knee extension -2 -15 -5   Ankle dorsiflexion      Ankle plantarflexion      Ankle inversion      Ankle eversion       (Blank rows = not tested)  LOWER EXTREMITY MMT: Deferred      FUNCTIONAL TESTS:  5 times sit to stand: 24.11 with Rt knee extended & UE support 03/26/2023 TUG: 42.59 03/27/2023  with front wheeled walker 35ft  04/18/2023 3MWT:342ft  05/14/2023 5STS: 10.09 sec with UE support TUG: 11.69 sec with straight cane : 596FT str cane  GAIT: Patient did not ambulate during treatment session due to increased pain. Patient required max assistance with car transfer                                                                                                                              TREATMENT DATE:  05/23/2023 Nustep Level 5 x 8 mins PT present to discuss status (full revolutions) Standing TKE with purple NZG band x 20 Cone tap up with left LE with weight shift onto right Rocker board x 2 min Gastroc stretch on rocker board propped on balance pad 5 x 10 sec Soleus stretch on rocker board propped on balance pad 5 x 10 sec Step up with right LE on 6" step x 10 fwd then x 10 lateral step up Gait training without ad: focus on increased step length  to achieve heel strike Seated LAQ x 20 4# right Sit to stand from standard chair 2x10 8# DB hold Sit to stand staggered (Lt  infront) 2x10 8# KB hold Vasopneumatic compression Rt knee moderate pressure x 15 minutes (challenged patient to keep leg out straight vs up  on bolster today)  05/21/2023 Nustep Level 5 x 8 mins PT present to discuss status (full revolutions) Standing TKE with blue band x 20 Supine quad set x 20 both Supine TKE with blue foam roller x 20 with 4 lbs (right) Supine SAQ with larger blue bolster x20 with 4 lbs (right) Supine SLR with 4 lbs 2 x 10 Supine hip abduction with 4lbs using towel x 20 Supine heel slides with 4 lbs x 20 Seated LAQ x 20 4# right Sit to stand from standard chair 2x10 8# DB hold Sit to stand staggered (Lt infront) 2x10 8# KB hold Vasopneumatic compression Rt knee moderate pressure x 15 minutes (challenged patient to keep leg out straight vs up on bolster today)   05/16/2023 Recumbent Bike Level 1- 8 mins PT present to discuss status (full revolutions) Seated LAQ x 20 4# AW bilateral  Sit to stand from standard chair 2x10 8# DB hold Sit to stand staggered (Lt infront) 2x10 8# KB hold 4 step climb reciprocal gait pattern, 3 rounds  Lateral step ups onto 6" box 2x10 each side  Lunges to balance pad fwd x 10 then lateral x 10 Seated knee flexion with slider and red loop 2 x 10 on Rt  Hamstring stretch on 2nd stair 2 x 30 sec Gastroc stretch on rockerboard 2 x 30 sec  Vasopneumatic compression Rt knee moderate pressure x 15 minutes   PATIENT EDUCATION:  Education details: HEP; POC Person educated: Patient Education method: Programmer, multimedia, Demonstration, and Handouts Education comprehension: verbalized understanding, returned demonstration, and needs further education  HOME EXERCISE PROGRAM: Access Code: [email or web address] URL: [email or web address] Date: 04/09/2023 Prepared by: Claude Manges  Exercises - Seated Heel Slide  - 1 x daily - 7 x weekly - 2 sets - 10 reps - Sitting Heel Slide with Towel  - 1 x daily - 7 x weekly - 2 sets - 10 reps - Supine Quad Set   - 1 x daily - 7 x weekly - 2 sets - 10 reps - 2-3 hold - Active Straight Leg Raise with Quad Set  - 1 x daily - 7 x weekly - 2 sets - 10 reps - Seated Hamstring Stretch  - 1 x daily - 7 x weekly - 2 sets - 10 reps - Standing March with Counter Support  - 1 x daily - 7 x weekly - 1 sets - 10 reps - Standing Hip Abduction with Counter Support  - 1 x daily - 7 x weekly - 1 sets - 10 reps - Supine Knee Extension Stretch on Towel Roll  - 1 x daily - 7 x weekly - 2 sets - 10 reps - Seated Knee Extension Stretch with Chair  - 1 x daily - 7 x weekly - 2 sets - 30 hold  ASSESSMENT:  CLINICAL IMPRESSION: Claris Che continues to progress and has been diligent with all instructions for propping.  She measures -10 degrees extension.  She had good quad activity at end range.  We worked on proper gait and focusing on increased step length on right to achieve heel strike.    Patient will benefit from skilled PT to address lingering lack of extension and abnormal gait.    OBJECTIVE IMPAIRMENTS: Abnormal gait, decreased mobility, difficulty walking, decreased ROM, decreased strength, increased edema, increased muscle spasms, impaired flexibility, postural dysfunction, obesity, and pain.   ACTIVITY LIMITATIONS: lifting, standing, squatting, stairs, transfers, and bed mobility  PARTICIPATION LIMITATIONS: cleaning, laundry, driving, shopping, community activity, and occupation  PERSONAL  FACTORS: Fitness and 1-2 comorbidities: HTN, Anemia; Type II diabetes   are also affecting patient's functional outcome.   REHAB POTENTIAL: Good  CLINICAL DECISION MAKING: Stable/uncomplicated  EVALUATION COMPLEXITY: Low   GOALS: Goals reviewed with patient? Yes  SHORT TERM GOALS: Target date: 05/02/2023  Patient will be independent with initial HEP. Baseline:  Goal status: MET 04/18/2023  2.  Patient will be able to complete a car transfer with < or = to 2/10 knee pain. Baseline:  Goal status: MET 05/16/23  3.  Patient  will demonstrate > or = to 90 degrees of knee flexion for improved sit to stand transfer. Baseline:  Goal status: MET 04/18/2023    LONG TERM GOALS: Target date: 06/28/2023 Patient will demonstrate independence in advanced HEP. Baseline:  Goal status: INITIAL  2.  Patient will be able to ascend a curb/ step for safe community negotiation. Baseline:  Goal status: MET 05/07/23  3.  Patient will be able to return to work and complete all work duties. Baseline:  Goal status: INITIAL  4.  Patient will score < or = to 19 sec on 5 STS for improved functional mobility. Baseline:  Goal status: MET 05/14/2023  5.  Patient will score > or = to 61 on FOTO for improved function.  Baseline:  Goal status: INITIAL  6.  Patient will be able to walk in the community and run errands with < or = to 2/10 knee pain. Baseline:  Goal status: INITIAL   PLAN:  PT FREQUENCY: 2x/week  PT DURATION: 12 weeks  PLANNED INTERVENTIONS: 97164- PT Re-evaluation, 97110-Therapeutic exercises, 97530- Therapeutic activity, [id number]- Neuromuscular re-education, 97535- Self Care, 02725- Manual therapy, [id number]- Gait training, [phone]- Aquatic Therapy, 97014- Electrical stimulation (unattended), [id number]- Electrical stimulation (manual), [id number]- Vasopneumatic device, [id number]- Ultrasound, [id number]- Traction (mechanical), [id number]- Ionotophoresis 4mg /ml Dexamethasone, Patient/Family education, Balance training, Stair training, Taping, Dry Needling, Joint mobilization, Joint manipulation, Spinal manipulation, Spinal mobilization, Scar mobilization, Vestibular training, Cryotherapy, and Moist heat  PLAN FOR NEXT SESSION: Focus on regaining full extension,  continue functional strengthening & Rt knee flexion mobility  Takerra Lupinacci B. Ruweyda Macknight, PT 05/23/23 10:14 PM [facility] [address] [place], [place] [address] Phone # [phone] Fax [phone]

## 2023-05-28 ENCOUNTER — Ambulatory Visit: Payer: Commercial Managed Care - PPO

## 2023-05-28 DIAGNOSIS — M6281 Muscle weakness (generalized): Secondary | ICD-10-CM

## 2023-05-28 DIAGNOSIS — M25561 Pain in right knee: Secondary | ICD-10-CM | POA: Diagnosis not present

## 2023-05-28 DIAGNOSIS — R262 Difficulty in walking, not elsewhere classified: Secondary | ICD-10-CM | POA: Diagnosis not present

## 2023-05-28 DIAGNOSIS — R252 Cramp and spasm: Secondary | ICD-10-CM

## 2023-05-28 DIAGNOSIS — R600 Localized edema: Secondary | ICD-10-CM

## 2023-05-28 DIAGNOSIS — G8929 Other chronic pain: Secondary | ICD-10-CM

## 2023-05-28 DIAGNOSIS — M25661 Stiffness of right knee, not elsewhere classified: Secondary | ICD-10-CM

## 2023-05-28 NOTE — Therapy (Signed)
 OUTPATIENT PHYSICAL THERAPY LOWER EXTREMITY TREATMENT   Patient Name: Katelyn Peterson MRN: [id number] DOB:November 21, 1945, 78 y.o., female Today's Date: 05/28/2023  END OF SESSION:  PT End of Session - 05/28/23 1503     Visit Number 19    Date for PT Re-Evaluation 06/28/23    Authorization Type [facility] [employer]    PT Start Time [phone]    PT Stop Time 1540    PT Time Calculation (min) 52 min    Activity Tolerance Patient tolerated treatment well    Behavior During Therapy WFL for tasks assessed/performed                 Past Medical History:  Diagnosis Date   Anemia    Arthritis    HTN (hypertension)    Hyperlipidemia    Obesity    Type II or unspecified type diabetes mellitus without mention of complication, not stated as uncontrolled    Past Surgical History:  Procedure Laterality Date   ABDOMINAL HYSTERECTOMY  1985   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   Procedure: TOTAL KNEE ARTHROPLASTY;  Surgeon: Ollen Gross, MD;  Location: [facility];  Service: Orthopedics;  Laterality: Right;   Patient Active Problem List   Diagnosis Date Noted   OA (osteoarthritis) of knee 03/18/2023   Primary osteoarthritis of right knee 03/18/2023   Preop exam for internal medicine 12/20/2022   Nuclear sclerotic cataract of right eye 12/07/2022   B12 deficiency 06/20/2022   Colon polyps 06/19/2022   Arthritis of left foot 04/11/2021   CRI (chronic renal insufficiency), stage 3 (moderate) (HCC) 12/15/2020   Wrist pain, left 12/10/2018   Knee pain, right 06/19/2017   Anemia 05/08/2016   Onychomycosis 06/22/2015   Arthritis of ankle 04/01/2015   Well adult exam 08/16/2014   Goiter 11/04/2012   Left ankle pain 08/22/2011   Grief 04/20/2011   Rhinitis 04/20/2011   ABDOMINAL DISTENSION 03/09/2010   KNEE PAIN 03/04/2008   LEG CRAMPS 09/03/2007   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) 04/11/2007   Obesity 04/11/2007   Dyslipidemia  associated with type 2 diabetes mellitus (HCC) 11/26/2006   Essential hypertension 11/26/2006    PCP: Tresa Garter, MD   REFERRING PROVIDER: Derenda Fennel, PA  REFERRING DIAG: M17.11 (ICD-10-CM) - Unilateral primary osteoarthritis, right knee  THERAPY DIAG:  Stiffness of right knee, not elsewhere classified  Chronic pain of right knee  Difficulty in walking, not elsewhere classified  Muscle weakness (generalized)  Cramp and spasm  Localized edema  Rationale for Evaluation and Treatment: Rehabilitation  ONSET DATE: 03/18/2023  SUBJECTIVE:   SUBJECTIVE STATEMENT: Patient reports continued stiffness.     From Eval: Patient presents post Rt TKA on 1/6. She has been walking once a day for 10 minutes. She was not given any exercises. She has increased pain with functional movements (sit to stand, getting in and out of a car, etc). She does not have any pain if her Rt knee if it is extended. She wants to get back to walking normally when running errands.  PERTINENT HISTORY: HTN, Anemia; Type II diabetes   PAIN: 05/14/2023 Are you having pain? Yes: NPRS scale: 0/10 Pain location: Rt leg Pain description: achy dull Aggravating factors: getting out car; sitting down with knee flexed Relieving factors: Pain medication  PRECAUTIONS: Other: Status post Rt TKA 03/18/2023  RED FLAGS: None   WEIGHT BEARING RESTRICTIONS: No  FALLS:  Has patient fallen in last 6 months? No  LIVING ENVIRONMENT: Lives with: lives with their family Lives in: House/apartment Stairs: Yes: Internal: 1 steps; none Has following equipment at home: Dan Humphreys - 2 wheeled and shower chair  OCCUPATION: Psychologist, sport and exercise at [employer]  PLOF: Independent and Independent with basic ADLs  PATIENT GOALS: To get the pain gone  NEXT MD VISIT: 04/02/2023  OBJECTIVE:  Note: Objective measures were completed at Evaluation unless otherwise noted.   PATIENT SURVEYS:  FOTO 31; goal 44  COGNITION: Overall  cognitive status: Within functional limits for tasks assessed     SENSATION: Rt knee Numbness   EDEMA:  Rt knee 3 cm above patella: 54.5cm 3 cam below patella:51cm At patella : 51.5 cm   POSTURE: rounded shoulders and forward head   LOWER EXTREMITY ROM: seated in wheelchair  Active ROM Right eval 03/14/2023  04/16/2023 Left eval  Hip flexion      Hip extension      Hip abduction      Hip adduction      Hip internal rotation      Hip external rotation      Knee flexion 56 AAROM 90 (semi-reclined) PROM 95    Knee extension -2 -15 -5   Ankle dorsiflexion      Ankle plantarflexion      Ankle inversion      Ankle eversion       (Blank rows = not tested)  LOWER EXTREMITY MMT: Deferred      FUNCTIONAL TESTS:  5 times sit to stand: 24.11 with Rt knee extended & UE support 03/26/2023 TUG: 42.59 03/27/2023  with front wheeled walker 41ft  04/18/2023 3MWT:334ft  05/14/2023 5STS: 10.09 sec with UE support TUG: 11.69 sec with straight cane : 596FT str cane  GAIT: Patient did not ambulate during treatment session due to increased pain. Patient required max assistance with car transfer                                                                                                                              TREATMENT DATE:  05/28/2023 Recumbent bike 5 x 8 mins PT present to discuss status (full revolutions) Sitting heel slides on slider x 20 Sitting LAQ with 4 lbs x 20 Sitting hip flexion 4 lbs 2 x 10 Sitting hip ER 4 lbs 2 x 10 Lateral band walks with blue loop x 5 laps at barre Standing TKE with pink "serious steel" (medium weight) band x 20 Standing hip extension x 20 with 4lbs both  Standing hip abduction x 20 with 4 lbs both Step ups x 10 on 6" step fwd then lateral  Rocker board x 2 min Gastroc stretch on rocker board propped on balance pad 5 x 10 sec Soleus stretch on rocker board propped on balance pad 5 x 10 sec Vasopneumatic compression Rt knee  moderate pressure x 15 minutes (challenged patient to keep leg out straight vs up on bolster today)  05/23/2023 Nustep Level 5  x 8 mins PT present to discuss status (full revolutions) Standing TKE with purple NZG band x 20 Cone tap up with left LE with weight shift onto right Rocker board x 2 min Gastroc stretch on rocker board propped on balance pad 5 x 10 sec Soleus stretch on rocker board propped on balance pad 5 x 10 sec Step up with right LE on 6" step x 10 fwd then x 10 lateral step up Gait training without ad: focus on increased step length  to achieve heel strike Seated LAQ x 20 4# right Sit to stand from standard chair 2x10 8# DB hold Sit to stand staggered (Lt infront) 2x10 8# KB hold Vasopneumatic compression Rt knee moderate pressure x 15 minutes (challenged patient to keep leg out straight vs up on bolster today)  05/21/2023 Nustep Level 5 x 8 mins PT present to discuss status (full revolutions) Standing TKE with blue band x 20 Supine quad set x 20 both Supine TKE with blue foam roller x 20 with 4 lbs (right) Supine SAQ with larger blue bolster x20 with 4 lbs (right) Supine SLR with 4 lbs 2 x 10 Supine hip abduction with 4lbs using towel x 20 Supine heel slides with 4 lbs x 20 Seated LAQ x 20 4# right Sit to stand from standard chair 2x10 8# DB hold Sit to stand staggered (Lt infront) 2x10 8# KB hold Vasopneumatic compression Rt knee moderate pressure x 15 minutes (challenged patient to keep leg out straight vs up on bolster today)   PATIENT EDUCATION:  Education details: HEP; POC Person educated: Patient Education method: Explanation, Demonstration, and Handouts Education comprehension: verbalized understanding, returned demonstration, and needs further education  HOME EXERCISE PROGRAM: Access Code: [email or web address] URL: [email or web address] Date: 04/09/2023 Prepared by: Claude Manges  Exercises - Seated Heel Slide  - 1 x daily - 7 x weekly - 2 sets - 10  reps - Sitting Heel Slide with Towel  - 1 x daily - 7 x weekly - 2 sets - 10 reps - Supine Quad Set  - 1 x daily - 7 x weekly - 2 sets - 10 reps - 2-3 hold - Active Straight Leg Raise with Quad Set  - 1 x daily - 7 x weekly - 2 sets - 10 reps - Seated Hamstring Stretch  - 1 x daily - 7 x weekly - 2 sets - 10 reps - Standing March with Counter Support  - 1 x daily - 7 x weekly - 1 sets - 10 reps - Standing Hip Abduction with Counter Support  - 1 x daily - 7 x weekly - 1 sets - 10 reps - Supine Knee Extension Stretch on Towel Roll  - 1 x daily - 7 x weekly - 2 sets - 10 reps - Seated Knee Extension Stretch with Chair  - 1 x daily - 7 x weekly - 2 sets - 30 hold  ASSESSMENT:  CLINICAL IMPRESSION: Claris Che continues to experience post rest stiffness at work.  We discussed that this is one of the drawbacks to returning to work too early.  Not being able to attend to her range of motion and propping or ice while at work.   Also unable to take the heavier pain medication if her pain spikes.  She continues to be very well motivated and compliant.  She should continue to do well.    Patient will benefit from skilled PT to address lingering lack of extension and abnormal gait.    OBJECTIVE  IMPAIRMENTS: Abnormal gait, decreased mobility, difficulty walking, decreased ROM, decreased strength, increased edema, increased muscle spasms, impaired flexibility, postural dysfunction, obesity, and pain.   ACTIVITY LIMITATIONS: lifting, standing, squatting, stairs, transfers, and bed mobility  PARTICIPATION LIMITATIONS: cleaning, laundry, driving, shopping, community activity, and occupation  PERSONAL FACTORS: Fitness and 1-2 comorbidities: HTN, Anemia; Type II diabetes   are also affecting patient's functional outcome.   REHAB POTENTIAL: Good  CLINICAL DECISION MAKING: Stable/uncomplicated  EVALUATION COMPLEXITY: Low   GOALS: Goals reviewed with patient? Yes  SHORT TERM GOALS: Target date:  05/02/2023  Patient will be independent with initial HEP. Baseline:  Goal status: MET 04/18/2023  2.  Patient will be able to complete a car transfer with < or = to 2/10 knee pain. Baseline:  Goal status: MET 05/16/23  3.  Patient will demonstrate > or = to 90 degrees of knee flexion for improved sit to stand transfer. Baseline:  Goal status: MET 04/18/2023    LONG TERM GOALS: Target date: 06/28/2023 Patient will demonstrate independence in advanced HEP. Baseline:  Goal status: INITIAL  2.  Patient will be able to ascend a curb/ step for safe community negotiation. Baseline:  Goal status: MET 05/07/23  3.  Patient will be able to return to work and complete all work duties. Baseline:  Goal status: INITIAL  4.  Patient will score < or = to 19 sec on 5 STS for improved functional mobility. Baseline:  Goal status: MET 05/14/2023  5.  Patient will score > or = to 61 on FOTO for improved function.  Baseline:  Goal status: INITIAL  6.  Patient will be able to walk in the community and run errands with < or = to 2/10 knee pain. Baseline:  Goal status: INITIAL   PLAN:  PT FREQUENCY: 2x/week  PT DURATION: 12 weeks  PLANNED INTERVENTIONS: 97164- PT Re-evaluation, 97110-Therapeutic exercises, 97530- Therapeutic activity, [id number]- Neuromuscular re-education, 97535- Self Care, 16109- Manual therapy, [id number]- Gait training, [phone]- Aquatic Therapy, 97014- Electrical stimulation (unattended), [id number]- Electrical stimulation (manual), [id number]- Vasopneumatic device, [id number]- Ultrasound, [id number]- Traction (mechanical), [id number]- Ionotophoresis 4mg /ml Dexamethasone, Patient/Family education, Balance training, Stair training, Taping, Dry Needling, Joint mobilization, Joint manipulation, Spinal manipulation, Spinal mobilization, Scar mobilization, Vestibular training, Cryotherapy, and Moist heat  PLAN FOR NEXT SESSION: Focus on regaining full extension,  continue functional strengthening & Rt knee flexion  mobility  Juliannah Ohmann B. Aslynn Brunetti, PT 05/28/23 9:04 PM [facility] [address], [address] [place], [place] [address] Phone # [phone] Fax [phone]

## 2023-05-30 DIAGNOSIS — H25811 Combined forms of age-related cataract, right eye: Secondary | ICD-10-CM | POA: Diagnosis not present

## 2023-05-30 DIAGNOSIS — H2511 Age-related nuclear cataract, right eye: Secondary | ICD-10-CM | POA: Diagnosis not present

## 2023-06-04 ENCOUNTER — Ambulatory Visit: Payer: Commercial Managed Care - PPO

## 2023-06-04 DIAGNOSIS — R600 Localized edema: Secondary | ICD-10-CM | POA: Diagnosis not present

## 2023-06-04 DIAGNOSIS — M25661 Stiffness of right knee, not elsewhere classified: Secondary | ICD-10-CM

## 2023-06-04 DIAGNOSIS — R262 Difficulty in walking, not elsewhere classified: Secondary | ICD-10-CM

## 2023-06-04 DIAGNOSIS — M6281 Muscle weakness (generalized): Secondary | ICD-10-CM | POA: Diagnosis not present

## 2023-06-04 DIAGNOSIS — M25561 Pain in right knee: Secondary | ICD-10-CM | POA: Diagnosis not present

## 2023-06-04 DIAGNOSIS — G8929 Other chronic pain: Secondary | ICD-10-CM | POA: Diagnosis not present

## 2023-06-04 DIAGNOSIS — R252 Cramp and spasm: Secondary | ICD-10-CM

## 2023-06-04 NOTE — Therapy (Unsigned)
 OUTPATIENT PHYSICAL THERAPY LOWER EXTREMITY TREATMENT   Patient Name: Katelyn Peterson MRN: [id number] DOB:November 12, 1945, 78 y.o., female Today's Date: 06/05/2023  END OF SESSION:  PT End of Session - 06/04/23 1507     Visit Number 20    Date for PT Re-Evaluation 06/28/23    Authorization Type [facility] [employer]    PT Start Time 1450    PT Stop Time 1545    PT Time Calculation (min) 55 min    Activity Tolerance Patient tolerated treatment well    Behavior During Therapy WFL for tasks assessed/performed                 Past Medical History:  Diagnosis Date   Anemia    Arthritis    HTN (hypertension)    Hyperlipidemia    Obesity    Type II or unspecified type diabetes mellitus without mention of complication, not stated as uncontrolled    Past Surgical History:  Procedure Laterality Date   ABDOMINAL HYSTERECTOMY  1985   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   Procedure: TOTAL KNEE ARTHROPLASTY;  Surgeon: Ollen Gross, MD;  Location: [facility];  Service: Orthopedics;  Laterality: Right;   Patient Active Problem List   Diagnosis Date Noted   OA (osteoarthritis) of knee 03/18/2023   Primary osteoarthritis of right knee 03/18/2023   Preop exam for internal medicine 12/20/2022   Nuclear sclerotic cataract of right eye 12/07/2022   B12 deficiency 06/20/2022   Colon polyps 06/19/2022   Arthritis of left foot 04/11/2021   CRI (chronic renal insufficiency), stage 3 (moderate) (HCC) 12/15/2020   Wrist pain, left 12/10/2018   Knee pain, right 06/19/2017   Anemia 05/08/2016   Onychomycosis 06/22/2015   Arthritis of ankle 04/01/2015   Well adult exam 08/16/2014   Goiter 11/04/2012   Left ankle pain 08/22/2011   Grief 04/20/2011   Rhinitis 04/20/2011   ABDOMINAL DISTENSION 03/09/2010   KNEE PAIN 03/04/2008   LEG CRAMPS 09/03/2007   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) 04/11/2007   Obesity 04/11/2007   Dyslipidemia  associated with type 2 diabetes mellitus (HCC) 11/26/2006   Essential hypertension 11/26/2006    PCP: Tresa Garter, MD   REFERRING PROVIDER: Derenda Fennel, PA  REFERRING DIAG: M17.11 (ICD-10-CM) - Unilateral primary osteoarthritis, right knee  THERAPY DIAG:  Stiffness of right knee, not elsewhere classified  Chronic pain of right knee  Difficulty in walking, not elsewhere classified  Cramp and spasm  Muscle weakness (generalized)  Rationale for Evaluation and Treatment: Rehabilitation  ONSET DATE: 03/18/2023  SUBJECTIVE:   SUBJECTIVE STATEMENT: Patient reports continued stiffness but surgeon was pleased with progress.      From Eval: Patient presents post Rt TKA on 1/6. She has been walking once a day for 10 minutes. She was not given any exercises. She has increased pain with functional movements (sit to stand, getting in and out of a car, etc). She does not have any pain if her Rt knee if it is extended. She wants to get back to walking normally when running errands.  PERTINENT HISTORY: HTN, Anemia; Type II diabetes   PAIN: 06/04/2023 Are you having pain? Yes: NPRS scale: 0/10 Pain location: Rt leg Pain description: achy dull Aggravating factors: getting out car; sitting down with knee flexed Relieving factors: Pain medication  PRECAUTIONS: Other: Status post Rt TKA 03/18/2023  RED FLAGS: None   WEIGHT BEARING RESTRICTIONS: No  FALLS:  Has patient fallen in  last 6 months? No  LIVING ENVIRONMENT: Lives with: lives with their family Lives in: House/apartment Stairs: Yes: Internal: 1 steps; none Has following equipment at home: Dan Humphreys - 2 wheeled and shower chair  OCCUPATION: Psychologist, sport and exercise at [employer]  PLOF: Independent and Independent with basic ADLs  PATIENT GOALS: To get the pain gone  NEXT MD VISIT: 04/02/2023  OBJECTIVE:  Note: Objective measures were completed at Evaluation unless otherwise noted.   PATIENT SURVEYS:  FOTO 31; goal  44  COGNITION: Overall cognitive status: Within functional limits for tasks assessed     SENSATION: Rt knee Numbness   EDEMA:  Rt knee 3 cm above patella: 54.5cm 3 cam below patella:51cm At patella : 51.5 cm   POSTURE: rounded shoulders and forward head   LOWER EXTREMITY ROM: seated in wheelchair  Active ROM Right eval 03/14/2023  04/16/2023 Left eval  Hip flexion      Hip extension      Hip abduction      Hip adduction      Hip internal rotation      Hip external rotation      Knee flexion 56 AAROM 90 (semi-reclined) PROM 95    Knee extension -2 -15 -5   Ankle dorsiflexion      Ankle plantarflexion      Ankle inversion      Ankle eversion       (Blank rows = not tested)  LOWER EXTREMITY MMT: Deferred      FUNCTIONAL TESTS:  5 times sit to stand: 24.11 with Rt knee extended & UE support 03/26/2023 TUG: 42.59 03/27/2023  with front wheeled walker 35ft  04/18/2023 3MWT:383ft  05/14/2023 5STS: 10.09 sec with UE support TUG: 11.69 sec with straight cane : 596FT str cane  GAIT: Patient did not ambulate during treatment session due to increased pain. Patient required max assistance with car transfer                                                                                                                              TREATMENT DATE:  06/04/2023 (patient had eye surgery and was not able to do any strenuous activity) Recumbent bike 5 x 8 mins PT present to discuss status (full revolutions) Sitting heel slides on slider x 20 Sitting LAQ with 5 lbs x 20 Sitting hip flexion 5 lbs 2 x 10 Sitting hip ER 5 lbs 2 x 10 Seated clam x 20 with red loop Long sitting static stretch x 2 min Long sitting quad sets x 20 with heavy vc's on full exten Long sitting extension stretch with addaday to IT band x 2 min Vasopneumatic compression Rt knee moderate pressure x 15 minutes (challenged patient to keep leg out straight vs up on bolster today)  05/28/2023 Recumbent  bike 5 x 8 mins PT present to discuss status (full revolutions) Sitting heel slides on slider x 20 Sitting LAQ with 4 lbs x 20 Sitting hip  flexion 4 lbs 2 x 10 Sitting hip ER 4 lbs 2 x 10 Lateral band walks with blue loop x 5 laps at barre Standing TKE with pink "serious steel" (medium weight) band x 20 Standing hip extension x 20 with 4lbs both  Standing hip abduction x 20 with 4 lbs both Step ups x 10 on 6" step fwd then lateral  Rocker board x 2 min Gastroc stretch on rocker board propped on balance pad 5 x 10 sec Soleus stretch on rocker board propped on balance pad 5 x 10 sec Vasopneumatic compression Rt knee moderate pressure x 15 minutes (challenged patient to keep leg out straight vs up on bolster today)  05/23/2023 Nustep Level 5 x 8 mins PT present to discuss status (full revolutions) Standing TKE with purple NZG band x 20 Cone tap up with left LE with weight shift onto right Rocker board x 2 min Gastroc stretch on rocker board propped on balance pad 5 x 10 sec Soleus stretch on rocker board propped on balance pad 5 x 10 sec Step up with right LE on 6" step x 10 fwd then x 10 lateral step up Gait training without ad: focus on increased step length  to achieve heel strike Seated LAQ x 20 4# right Sit to stand from standard chair 2x10 8# DB hold Sit to stand staggered (Lt infront) 2x10 8# KB hold Vasopneumatic compression Rt knee moderate pressure x 15 minutes (challenged patient to keep leg out straight vs up on bolster today)  PATIENT EDUCATION:  Education details: HEP; POC Person educated: Patient Education method: Explanation, Demonstration, and Handouts Education comprehension: verbalized understanding, returned demonstration, and needs further education  HOME EXERCISE PROGRAM: Access Code: [email or web address] URL: [email or web address] Date: 04/09/2023 Prepared by: Claude Manges  Exercises - Seated Heel Slide  - 1 x daily - 7 x weekly - 2 sets - 10 reps -  Sitting Heel Slide with Towel  - 1 x daily - 7 x weekly - 2 sets - 10 reps - Supine Quad Set  - 1 x daily - 7 x weekly - 2 sets - 10 reps - 2-3 hold - Active Straight Leg Raise with Quad Set  - 1 x daily - 7 x weekly - 2 sets - 10 reps - Seated Hamstring Stretch  - 1 x daily - 7 x weekly - 2 sets - 10 reps - Standing March with Counter Support  - 1 x daily - 7 x weekly - 1 sets - 10 reps - Standing Hip Abduction with Counter Support  - 1 x daily - 7 x weekly - 1 sets - 10 reps - Supine Knee Extension Stretch on Towel Roll  - 1 x daily - 7 x weekly - 2 sets - 10 reps - Seated Knee Extension Stretch with Chair  - 1 x daily - 7 x weekly - 2 sets - 30 hold  ASSESSMENT:  CLINICAL IMPRESSION: Claris Che is progressing toward final goals but struggling to regain full extension.  She is aware of need to prop for extension and seems to be fairly compliant with this but is working full time again and may not be able to get into optimal position to continue to work on this.     Patient will benefit from skilled PT to address lingering lack of extension and abnormal gait.    OBJECTIVE IMPAIRMENTS: Abnormal gait, decreased mobility, difficulty walking, decreased ROM, decreased strength, increased edema, increased muscle spasms, impaired flexibility, postural dysfunction, obesity, and pain.  ACTIVITY LIMITATIONS: lifting, standing, squatting, stairs, transfers, and bed mobility  PARTICIPATION LIMITATIONS: cleaning, laundry, driving, shopping, community activity, and occupation  PERSONAL FACTORS: Fitness and 1-2 comorbidities: HTN, Anemia; Type II diabetes   are also affecting patient's functional outcome.   REHAB POTENTIAL: Good  CLINICAL DECISION MAKING: Stable/uncomplicated  EVALUATION COMPLEXITY: Low   GOALS: Goals reviewed with patient? Yes  SHORT TERM GOALS: Target date: 05/02/2023  Patient will be independent with initial HEP. Baseline:  Goal status: MET 04/18/2023  2.  Patient will be able  to complete a car transfer with < or = to 2/10 knee pain. Baseline:  Goal status: MET 05/16/23  3.  Patient will demonstrate > or = to 90 degrees of knee flexion for improved sit to stand transfer. Baseline:  Goal status: MET 04/18/2023    LONG TERM GOALS: Target date: 06/28/2023 Patient will demonstrate independence in advanced HEP. Baseline:  Goal status: INITIAL  2.  Patient will be able to ascend a curb/ step for safe community negotiation. Baseline:  Goal status: MET 05/07/23  3.  Patient will be able to return to work and complete all work duties. Baseline:  Goal status: INITIAL  4.  Patient will score < or = to 19 sec on 5 STS for improved functional mobility. Baseline:  Goal status: MET 05/14/2023  5.  Patient will score > or = to 61 on FOTO for improved function.  Baseline:  Goal status: INITIAL  6.  Patient will be able to walk in the community and run errands with < or = to 2/10 knee pain. Baseline:  Goal status: INITIAL   PLAN:  PT FREQUENCY: 2x/week  PT DURATION: 12 weeks  PLANNED INTERVENTIONS: 97164- PT Re-evaluation, 97110-Therapeutic exercises, 97530- Therapeutic activity, [id number]- Neuromuscular re-education, 97535- Self Care, 96045- Manual therapy, [id number]- Gait training, [phone]- Aquatic Therapy, 97014- Electrical stimulation (unattended), [id number]- Electrical stimulation (manual), [id number]- Vasopneumatic device, [id number]- Ultrasound, [id number]- Traction (mechanical), [id number]- Ionotophoresis 4mg /ml Dexamethasone, Patient/Family education, Balance training, Stair training, Taping, Dry Needling, Joint mobilization, Joint manipulation, Spinal manipulation, Spinal mobilization, Scar mobilization, Vestibular training, Cryotherapy, and Moist heat  PLAN FOR NEXT SESSION: Focus on regaining full extension,  continue functional strengthening & Rt knee flexion mobility  Naomy Esham B. Carolena Fairbank, PT 06/05/23 7:28 AM [facility] [address], [address] [place], [place] [address] Phone # [phone] Fax [phone]

## 2023-06-06 ENCOUNTER — Ambulatory Visit: Payer: Commercial Managed Care - PPO

## 2023-06-06 DIAGNOSIS — M6281 Muscle weakness (generalized): Secondary | ICD-10-CM | POA: Diagnosis not present

## 2023-06-06 DIAGNOSIS — R252 Cramp and spasm: Secondary | ICD-10-CM | POA: Diagnosis not present

## 2023-06-06 DIAGNOSIS — G8929 Other chronic pain: Secondary | ICD-10-CM | POA: Diagnosis not present

## 2023-06-06 DIAGNOSIS — R600 Localized edema: Secondary | ICD-10-CM | POA: Diagnosis not present

## 2023-06-06 DIAGNOSIS — M25561 Pain in right knee: Secondary | ICD-10-CM | POA: Diagnosis not present

## 2023-06-06 DIAGNOSIS — R262 Difficulty in walking, not elsewhere classified: Secondary | ICD-10-CM | POA: Diagnosis not present

## 2023-06-06 DIAGNOSIS — M25661 Stiffness of right knee, not elsewhere classified: Secondary | ICD-10-CM | POA: Diagnosis not present

## 2023-06-06 NOTE — Therapy (Signed)
 OUTPATIENT PHYSICAL THERAPY LOWER EXTREMITY TREATMENT   Patient Name: Katelyn Peterson MRN: [id number] DOB:[birth year], 78 y.o., female Today's Date: 06/06/2023  END OF SESSION:  PT End of Session - 06/06/23 1455     Visit Number 21    Date for PT Re-Evaluation 06/28/23    Authorization Type [facility] [employer]    PT Start Time [phone]    PT Stop Time 1532    PT Time Calculation (min) 44 min    Activity Tolerance Patient tolerated treatment well    Behavior During Therapy WFL for tasks assessed/performed                 Past Medical History:  Diagnosis Date   Anemia    Arthritis    HTN (hypertension)    Hyperlipidemia    Obesity    Type II or unspecified type diabetes mellitus without mention of complication, not stated as uncontrolled    Past Surgical History:  Procedure Laterality Date   ABDOMINAL HYSTERECTOMY  1985   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   Procedure: TOTAL KNEE ARTHROPLASTY;  Surgeon: Katelyn Gross, MD;  Location: [facility];  Service: Orthopedics;  Laterality: Right;   Patient Active Problem List   Diagnosis Date Noted   OA (osteoarthritis) of knee 03/18/2023   Primary osteoarthritis of right knee 03/18/2023   Preop exam for internal medicine 12/20/2022   Nuclear sclerotic cataract of right eye 12/07/2022   B12 deficiency 06/20/2022   Colon polyps 06/19/2022   Arthritis of left foot 04/11/2021   CRI (chronic renal insufficiency), stage 3 (moderate) (HCC) 12/15/2020   Wrist pain, left 12/10/2018   Knee pain, right 06/19/2017   Anemia 05/08/2016   Onychomycosis 06/22/2015   Arthritis of ankle 04/01/2015   Well adult exam 08/16/2014   Goiter 11/04/2012   Left ankle pain 08/22/2011   Grief 04/20/2011   Rhinitis 04/20/2011   ABDOMINAL DISTENSION 03/09/2010   KNEE PAIN 03/04/2008   LEG CRAMPS 09/03/2007   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) 04/11/2007   Obesity 04/11/2007   Dyslipidemia  associated with type 2 diabetes mellitus (HCC) 11/26/2006   Essential hypertension 11/26/2006    PCP: Katelyn Garter, MD   REFERRING PROVIDER: Derenda Fennel, PA  REFERRING DIAG: M17.11 (ICD-10-CM) - Unilateral primary osteoarthritis, right knee  THERAPY DIAG:  Stiffness of right knee, not elsewhere classified  Chronic pain of right knee  Difficulty in walking, not elsewhere classified  Muscle weakness (generalized)  Cramp and spasm  Localized edema  Rationale for Evaluation and Treatment: Rehabilitation  ONSET DATE: 03/18/2023  SUBJECTIVE:   SUBJECTIVE STATEMENT: Patient reports she continues to work on her extension.  She now has a box at work that she is propping her foot on to help with this.        From Eval: Patient presents post Rt TKA on 1/6. She has been walking once a day for 10 minutes. She was not given any exercises. She has increased pain with functional movements (sit to stand, getting in and out of a car, etc). She does not have any pain if her Rt knee if it is extended. She wants to get back to walking normally when running errands.  PERTINENT HISTORY: HTN, Anemia; Type II diabetes   PAIN: 06/06/2023 Are you having pain? Yes: NPRS scale: 0/10 Pain location: Rt leg Pain description: achy dull Aggravating factors: getting out car; sitting down with knee flexed Relieving factors: Pain medication  PRECAUTIONS:  Other: Status post Rt TKA 03/18/2023  RED FLAGS: None   WEIGHT BEARING RESTRICTIONS: No  FALLS:  Has patient fallen in last 6 months? No  LIVING ENVIRONMENT: Lives with: lives with their family Lives in: House/apartment Stairs: Yes: Internal: 1 steps; none Has following equipment at home: Dan Humphreys - 2 wheeled and shower chair  OCCUPATION: Psychologist, sport and exercise at [employer]  PLOF: Independent and Independent with basic ADLs  PATIENT GOALS: To get the pain gone  NEXT MD VISIT: 04/02/2023  OBJECTIVE:  Note: Objective measures were completed at  Evaluation unless otherwise noted.   PATIENT SURVEYS:  FOTO 31; goal 66  COGNITION: Overall cognitive status: Within functional limits for tasks assessed     SENSATION: Rt knee Numbness   EDEMA:  Rt knee 3 cm above patella: 54.5cm 3 cam below patella:51cm At patella : 51.5 cm   POSTURE: rounded shoulders and forward head   LOWER EXTREMITY ROM: seated in wheelchair  Active ROM Right eval 03/14/2023  04/16/2023 Left eval  Hip flexion      Hip extension      Hip abduction      Hip adduction      Hip internal rotation      Hip external rotation      Knee flexion 56 AAROM 90 (semi-reclined) PROM 95    Knee extension -2 -15 -5   Ankle dorsiflexion      Ankle plantarflexion      Ankle inversion      Ankle eversion       (Blank rows = not tested)  LOWER EXTREMITY MMT: Deferred      FUNCTIONAL TESTS:  5 times sit to stand: 24.11 with Rt knee extended & UE support 03/26/2023 TUG: 42.59 03/27/2023  with front wheeled walker 13ft  04/18/2023 3MWT:354ft  05/14/2023 5STS: 10.09 sec with UE support TUG: 11.69 sec with straight cane : 596FT str cane  GAIT: Patient did not ambulate during treatment session due to increased pain. Patient required max assistance with car transfer                                                                                                                              TREATMENT DATE:  06/06/2023 (still on intracranial pressure precautions from her eye surgery) Nustep x 5 min at level 5 Rocker board x 2 min  Gastroc stretch x 10 holding 10 sec Right soleus stretch x 10 holding 10 sec Right TKE with purple serious steel band (heavy) Seated hamstring stretch with heel propped on foot stool x 10 holding 10 sec each Seated heel slides for flexion ROM Seated with leg outstretched: quad sets x 20 Standing hip abduction bilateral 0# 2 x 10 each Seated leg outstretched quad set x 20 Vasopneumatic compression Rt knee moderate pressure x  15 minutes (right leg out straight to assist with extension)  06/04/2023 (patient had eye surgery and was not able to do any strenuous activity) Recumbent  bike 5 x 8 mins PT present to discuss status (full revolutions) Sitting heel slides on slider x 20 Sitting LAQ with 5 lbs x 20 Sitting hip flexion 5 lbs 2 x 10 Sitting hip ER 5 lbs 2 x 10 Seated clam x 20 with red loop Long sitting static stretch x 2 min Long sitting quad sets x 20 with heavy vc's on full exten Long sitting extension stretch with addaday to IT band x 2 min Vasopneumatic compression Rt knee moderate pressure x 15 minutes (challenged patient to keep leg out straight vs up on bolster today)  05/28/2023 Recumbent bike 5 x 8 mins PT present to discuss status (full revolutions) Sitting heel slides on slider x 20 Sitting LAQ with 4 lbs x 20 Sitting hip flexion 4 lbs 2 x 10 Sitting hip ER 4 lbs 2 x 10 Lateral band walks with blue loop x 5 laps at barre Standing TKE with pink "serious steel" (medium weight) band x 20 Standing hip extension x 20 with 4lbs both  Standing hip abduction x 20 with 4 lbs both Step ups x 10 on 6" step fwd then lateral  Rocker board x 2 min Gastroc stretch on rocker board propped on balance pad 5 x 10 sec Soleus stretch on rocker board propped on balance pad 5 x 10 sec Vasopneumatic compression Rt knee moderate pressure x 15 minutes (challenged patient to keep leg out straight vs up on bolster today)  PATIENT EDUCATION:  Education details: HEP; POC Person educated: Patient Education method: Explanation, Demonstration, and Handouts Education comprehension: verbalized understanding, returned demonstration, and needs further education  HOME EXERCISE PROGRAM: Access Code: [email or web address] URL: [email or web address] Date: 04/09/2023 Prepared by: Claude Manges  Exercises - Seated Heel Slide  - 1 x daily - 7 x weekly - 2 sets - 10 reps - Sitting Heel Slide with Towel  - 1 x daily - 7 x weekly  - 2 sets - 10 reps - Supine Quad Set  - 1 x daily - 7 x weekly - 2 sets - 10 reps - 2-3 hold - Active Straight Leg Raise with Quad Set  - 1 x daily - 7 x weekly - 2 sets - 10 reps - Seated Hamstring Stretch  - 1 x daily - 7 x weekly - 2 sets - 10 reps - Standing March with Counter Support  - 1 x daily - 7 x weekly - 1 sets - 10 reps - Standing Hip Abduction with Counter Support  - 1 x daily - 7 x weekly - 1 sets - 10 reps - Supine Knee Extension Stretch on Towel Roll  - 1 x daily - 7 x weekly - 2 sets - 10 reps - Seated Knee Extension Stretch with Chair  - 1 x daily - 7 x weekly - 2 sets - 30 hold  ASSESSMENT:  CLINICAL IMPRESSION: Katelyn Peterson has been working on her extension and is walking with much improved symmetry and step length.  She was still under post eye surgery precautions today so we continue to avoid any intracranial pressure activities.   Patient will benefit from skilled PT to address lingering lack of extension and abnormal gait.    OBJECTIVE IMPAIRMENTS: Abnormal gait, decreased mobility, difficulty walking, decreased ROM, decreased strength, increased edema, increased muscle spasms, impaired flexibility, postural dysfunction, obesity, and pain.   ACTIVITY LIMITATIONS: lifting, standing, squatting, stairs, transfers, and bed mobility  PARTICIPATION LIMITATIONS: cleaning, laundry, driving, shopping, community activity, and occupation  PERSONAL FACTORS: Fitness and 1-2  comorbidities: HTN, Anemia; Type II diabetes   are also affecting patient's functional outcome.   REHAB POTENTIAL: Good  CLINICAL DECISION MAKING: Stable/uncomplicated  EVALUATION COMPLEXITY: Low   GOALS: Goals reviewed with patient? Yes  SHORT TERM GOALS: Target date: 05/02/2023  Patient will be independent with initial HEP. Baseline:  Goal status: MET 04/18/2023  2.  Patient will be able to complete a car transfer with < or = to 2/10 knee pain. Baseline:  Goal status: MET 05/16/23  3.  Patient will  demonstrate > or = to 90 degrees of knee flexion for improved sit to stand transfer. Baseline:  Goal status: MET 04/18/2023    LONG TERM GOALS: Target date: 06/28/2023 Patient will demonstrate independence in advanced HEP. Baseline:  Goal status: INITIAL  2.  Patient will be able to ascend a curb/ step for safe community negotiation. Baseline:  Goal status: MET 05/07/23  3.  Patient will be able to return to work and complete all work duties. Baseline:  Goal status: INITIAL  4.  Patient will score < or = to 19 sec on 5 STS for improved functional mobility. Baseline:  Goal status: MET 05/14/2023  5.  Patient will score > or = to 61 on FOTO for improved function.  Baseline:  Goal status: INITIAL  6.  Patient will be able to walk in the community and run errands with < or = to 2/10 knee pain. Baseline:  Goal status: INITIAL   PLAN:  PT FREQUENCY: 2x/week  PT DURATION: 12 weeks  PLANNED INTERVENTIONS: 97164- PT Re-evaluation, 97110-Therapeutic exercises, 97530- Therapeutic activity, [id number]- Neuromuscular re-education, 97535- Self Care, 47425- Manual therapy, [id number]- Gait training, [id number]- Aquatic Therapy, 97014- Electrical stimulation (unattended), [id number]- Electrical stimulation (manual), [id number]- Vasopneumatic device, [id number]- Ultrasound, [id number]- Traction (mechanical), [id number]- Ionotophoresis 4mg /ml Dexamethasone, Patient/Family education, Balance training, Stair training, Taping, Dry Needling, Joint mobilization, Joint manipulation, Spinal manipulation, Spinal mobilization, Scar mobilization, Vestibular training, Cryotherapy, and Moist heat  PLAN FOR NEXT SESSION: Focus on regaining full extension,  continue functional strengthening & Rt knee flexion mobility  Fardowsa Authier B. Lexa Coronado, PT 06/06/23 3:24 PM [facility] [address] [place], [place] [address] Phone # [phone] Fax [phone]

## 2023-06-11 ENCOUNTER — Ambulatory Visit: Payer: Commercial Managed Care - PPO | Attending: Student

## 2023-06-11 DIAGNOSIS — R262 Difficulty in walking, not elsewhere classified: Secondary | ICD-10-CM | POA: Insufficient documentation

## 2023-06-11 DIAGNOSIS — R252 Cramp and spasm: Secondary | ICD-10-CM | POA: Diagnosis not present

## 2023-06-11 DIAGNOSIS — M25661 Stiffness of right knee, not elsewhere classified: Secondary | ICD-10-CM | POA: Diagnosis not present

## 2023-06-11 DIAGNOSIS — R293 Abnormal posture: Secondary | ICD-10-CM | POA: Diagnosis not present

## 2023-06-11 DIAGNOSIS — R600 Localized edema: Secondary | ICD-10-CM | POA: Insufficient documentation

## 2023-06-11 DIAGNOSIS — G8929 Other chronic pain: Secondary | ICD-10-CM | POA: Insufficient documentation

## 2023-06-11 DIAGNOSIS — M25561 Pain in right knee: Secondary | ICD-10-CM | POA: Diagnosis not present

## 2023-06-11 DIAGNOSIS — M6281 Muscle weakness (generalized): Secondary | ICD-10-CM | POA: Diagnosis not present

## 2023-06-11 NOTE — Therapy (Signed)
 OUTPATIENT PHYSICAL THERAPY LOWER EXTREMITY TREATMENT   Patient Name: Katelyn Peterson MRN: [id number] DOB:[birth year], 78 y.o., female Today's Date: 06/11/2023  END OF SESSION:  PT End of Session - 06/11/23 1458     Visit Number 22    Date for PT Re-Evaluation 06/28/23    Authorization Type [facility] [employer]    PT Start Time [phone]    PT Stop Time 1535    PT Time Calculation (min) 47 min    Activity Tolerance Patient tolerated treatment well    Behavior During Therapy WFL for tasks assessed/performed                 Past Medical History:  Diagnosis Date   Anemia    Arthritis    HTN (hypertension)    Hyperlipidemia    Obesity    Type II or unspecified type diabetes mellitus without mention of complication, not stated as uncontrolled    Past Surgical History:  Procedure Laterality Date   ABDOMINAL HYSTERECTOMY  1985   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   Procedure: TOTAL KNEE ARTHROPLASTY;  Surgeon: Ollen Gross, MD;  Location: [facility];  Service: Orthopedics;  Laterality: Right;   Patient Active Problem List   Diagnosis Date Noted   OA (osteoarthritis) of knee 03/18/2023   Primary osteoarthritis of right knee 03/18/2023   Preop exam for internal medicine 12/20/2022   Nuclear sclerotic cataract of right eye 12/07/2022   B12 deficiency 06/20/2022   Colon polyps 06/19/2022   Arthritis of left foot 04/11/2021   CRI (chronic renal insufficiency), stage 3 (moderate) (HCC) 12/15/2020   Wrist pain, left 12/10/2018   Knee pain, right 06/19/2017   Anemia 05/08/2016   Onychomycosis 06/22/2015   Arthritis of ankle 04/01/2015   Well adult exam 08/16/2014   Goiter 11/04/2012   Left ankle pain 08/22/2011   Grief 04/20/2011   Rhinitis 04/20/2011   ABDOMINAL DISTENSION 03/09/2010   KNEE PAIN 03/04/2008   LEG CRAMPS 09/03/2007   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) 04/11/2007   Obesity 04/11/2007   Dyslipidemia  associated with type 2 diabetes mellitus (HCC) 11/26/2006   Essential hypertension 11/26/2006    PCP: Tresa Garter, MD   REFERRING PROVIDER: Derenda Fennel, PA  REFERRING DIAG: M17.11 (ICD-10-CM) - Unilateral primary osteoarthritis, right knee  THERAPY DIAG:  Stiffness of right knee, not elsewhere classified  Chronic pain of right knee  Difficulty in walking, not elsewhere classified  Muscle weakness (generalized)  Cramp and spasm  Abnormal posture  Localized edema  Rationale for Evaluation and Treatment: Rehabilitation  ONSET DATE: 03/18/2023  SUBJECTIVE:   SUBJECTIVE STATEMENT: Patient reports just continued stiffness post rest but consistently working on this.         From Eval: Patient presents post Rt TKA on 1/6. She has been walking once a day for 10 minutes. She was not given any exercises. She has increased pain with functional movements (sit to stand, getting in and out of a car, etc). She does not have any pain if her Rt knee if it is extended. She wants to get back to walking normally when running errands.  PERTINENT HISTORY: HTN, Anemia; Type II diabetes   PAIN: 06/06/2023 Are you having pain? Yes: NPRS scale: 0/10 Pain location: Rt leg Pain description: achy dull Aggravating factors: getting out car; sitting down with knee flexed Relieving factors: Pain medication  PRECAUTIONS: Other: Status post Rt TKA 03/18/2023  RED FLAGS: None  WEIGHT BEARING RESTRICTIONS: No  FALLS:  Has patient fallen in last 6 months? No  LIVING ENVIRONMENT: Lives with: lives with their family Lives in: House/apartment Stairs: Yes: Internal: 1 steps; none Has following equipment at home: Dan Humphreys - 2 wheeled and shower chair  OCCUPATION: Psychologist, sport and exercise at [employer]  PLOF: Independent and Independent with basic ADLs  PATIENT GOALS: To get the pain gone  NEXT MD VISIT: 04/02/2023  OBJECTIVE:  Note: Objective measures were completed at Evaluation unless otherwise  noted.   PATIENT SURVEYS:  FOTO 31; goal 46  COGNITION: Overall cognitive status: Within functional limits for tasks assessed     SENSATION: Rt knee Numbness   EDEMA:  Rt knee 3 cm above patella: 54.5cm 3 cam below patella:51cm At patella : 51.5 cm   POSTURE: rounded shoulders and forward head   LOWER EXTREMITY ROM: seated in wheelchair  Active ROM Right eval 03/14/2023  04/16/2023 Left eval  Hip flexion      Hip extension      Hip abduction      Hip adduction      Hip internal rotation      Hip external rotation      Knee flexion 56 AAROM 90 (semi-reclined) PROM 95    Knee extension -2 -15 -5   Ankle dorsiflexion      Ankle plantarflexion      Ankle inversion      Ankle eversion       (Blank rows = not tested)  LOWER EXTREMITY MMT: Deferred      FUNCTIONAL TESTS:  5 times sit to stand: 24.11 with Rt knee extended & UE support 03/26/2023 TUG: 42.59 03/27/2023  with front wheeled walker 2ft  04/18/2023 3MWT:346ft  05/14/2023 5STS: 10.09 sec with UE support TUG: 11.69 sec with straight cane : 596FT str cane  GAIT: Patient did not ambulate during treatment session due to increased pain. Patient required max assistance with car transfer                                                                                                                              TREATMENT DATE:  06/11/2023  Nustep x 9 min at level 5 Rocker board x 2 min  TKE with light blue (50 lb) NZG band while doing step ups on 6" step 2 x 10 Eccentric heel taps right LE off 6" step 2 x 10 good form, no pain reported Step ups 2 x 10 on 6" step fwd Lateral band walks with yellow loop x 5 laps at barre Seated clam x 20 with yellow loop  Seated LAQ x 20 with 5 lbs both Seated march x 20 with 5 lbs both Seated hip ER 2 x 10 with 5 lbs both Vasopneumatic compression Rt knee moderate pressure x 15 minutes (right leg out straight to assist with extension)  06/06/2023 (still on  intracranial pressure precautions from her eye surgery) Nustep x 5 min at  level 5 Rocker board x 2 min  Gastroc stretch x 10 holding 10 sec Right soleus stretch x 10 holding 10 sec Right TKE with purple serious steel band (heavy) Seated hamstring stretch with heel propped on foot stool x 10 holding 10 sec each Seated heel slides for flexion ROM Seated with leg outstretched: quad sets x 20 Standing hip abduction bilateral 0# 2 x 10 each Seated leg outstretched quad set x 20 Vasopneumatic compression Rt knee moderate pressure x 15 minutes (right leg out straight to assist with extension)  06/04/2023 (patient had eye surgery and was not able to do any strenuous activity) Recumbent bike 5 x 8 mins PT present to discuss status (full revolutions) Sitting heel slides on slider x 20 Sitting LAQ with 5 lbs x 20 Sitting hip flexion 5 lbs 2 x 10 Sitting hip ER 5 lbs 2 x 10 Seated clam x 20 with red loop Long sitting static stretch x 2 min Long sitting quad sets x 20 with heavy vc's on full exten Long sitting extension stretch with addaday to IT band x 2 min Vasopneumatic compression Rt knee moderate pressure x 15 minutes (challenged patient to keep leg out straight vs up on bolster today)   PATIENT EDUCATION:  Education details: HEP; POC Person educated: Patient Education method: Explanation, Demonstration, and Handouts Education comprehension: verbalized understanding, returned demonstration, and needs further education  HOME EXERCISE PROGRAM: Access Code: [email or web address] URL: [email or web address] Date: 04/09/2023 Prepared by: Claude Manges  Exercises - Seated Heel Slide  - 1 x daily - 7 x weekly - 2 sets - 10 reps - Sitting Heel Slide with Towel  - 1 x daily - 7 x weekly - 2 sets - 10 reps - Supine Quad Set  - 1 x daily - 7 x weekly - 2 sets - 10 reps - 2-3 hold - Active Straight Leg Raise with Quad Set  - 1 x daily - 7 x weekly - 2 sets - 10 reps - Seated Hamstring Stretch  -  1 x daily - 7 x weekly - 2 sets - 10 reps - Standing March with Counter Support  - 1 x daily - 7 x weekly - 1 sets - 10 reps - Standing Hip Abduction with Counter Support  - 1 x daily - 7 x weekly - 1 sets - 10 reps - Supine Knee Extension Stretch on Towel Roll  - 1 x daily - 7 x weekly - 2 sets - 10 reps - Seated Knee Extension Stretch with Chair  - 1 x daily - 7 x weekly - 2 sets - 30 hold  ASSESSMENT:  CLINICAL IMPRESSION: Katelyn Peterson continues to show steady improvement.  She was able to do eccentric step downs today with no pain and good form.  Minimal fatigue on all step activities .  OBJECTIVE IMPAIRMENTS: Abnormal gait, decreased mobility, difficulty walking, decreased ROM, decreased strength, increased edema, increased muscle spasms, impaired flexibility, postural dysfunction, obesity, and pain.   ACTIVITY LIMITATIONS: lifting, standing, squatting, stairs, transfers, and bed mobility  PARTICIPATION LIMITATIONS: cleaning, laundry, driving, shopping, community activity, and occupation  PERSONAL FACTORS: Fitness and 1-2 comorbidities: HTN, Anemia; Type II diabetes   are also affecting patient's functional outcome.   REHAB POTENTIAL: Good  CLINICAL DECISION MAKING: Stable/uncomplicated  EVALUATION COMPLEXITY: Low   GOALS: Goals reviewed with patient? Yes  SHORT TERM GOALS: Target date: 05/02/2023  Patient will be independent with initial HEP. Baseline:  Goal status: MET 04/18/2023  2.  Patient will be  able to complete a car transfer with < or = to 2/10 knee pain. Baseline:  Goal status: MET 05/16/23  3.  Patient will demonstrate > or = to 90 degrees of knee flexion for improved sit to stand transfer. Baseline:  Goal status: MET 04/18/2023    LONG TERM GOALS: Target date: 06/28/2023 Patient will demonstrate independence in advanced HEP. Baseline:  Goal status: INITIAL  2.  Patient will be able to ascend a curb/ step for safe community negotiation. Baseline:  Goal status:  MET 05/07/23  3.  Patient will be able to return to work and complete all work duties. Baseline:  Goal status: INITIAL  4.  Patient will score < or = to 19 sec on 5 STS for improved functional mobility. Baseline:  Goal status: MET 05/14/2023  5.  Patient will score > or = to 61 on FOTO for improved function.  Baseline:  Goal status: INITIAL  6.  Patient will be able to walk in the community and run errands with < or = to 2/10 knee pain. Baseline:  Goal status: INITIAL   PLAN:  PT FREQUENCY: 2x/week  PT DURATION: 12 weeks  PLANNED INTERVENTIONS: 97164- PT Re-evaluation, 97110-Therapeutic exercises, 97530- Therapeutic activity, [id number]- Neuromuscular re-education, 97535- Self Care, 91478- Manual therapy, [id number]- Gait training, [phone]- Aquatic Therapy, 97014- Electrical stimulation (unattended), [id number]- Electrical stimulation (manual), [id number]- Vasopneumatic device, [id number]- Ultrasound, [id number]- Traction (mechanical), [id number]- Ionotophoresis 4mg /ml Dexamethasone, Patient/Family education, Balance training, Stair training, Taping, Dry Needling, Joint mobilization, Joint manipulation, Spinal manipulation, Spinal mobilization, Scar mobilization, Vestibular training, Cryotherapy, and Moist heat  PLAN FOR NEXT SESSION: Continue to focus on regaining full extension,  continue functional strengthening & Rt knee flexion mobility  Eural Holzschuh B. Deavin Forst, PT 06/11/23 3:26 PM [facility] [address], [address] [place], [place] [address] Phone # [phone] Fax [phone]

## 2023-06-13 ENCOUNTER — Ambulatory Visit: Payer: Commercial Managed Care - PPO

## 2023-06-13 DIAGNOSIS — R252 Cramp and spasm: Secondary | ICD-10-CM

## 2023-06-13 DIAGNOSIS — G8929 Other chronic pain: Secondary | ICD-10-CM | POA: Diagnosis not present

## 2023-06-13 DIAGNOSIS — R262 Difficulty in walking, not elsewhere classified: Secondary | ICD-10-CM | POA: Diagnosis not present

## 2023-06-13 DIAGNOSIS — M25661 Stiffness of right knee, not elsewhere classified: Secondary | ICD-10-CM | POA: Diagnosis not present

## 2023-06-13 DIAGNOSIS — R600 Localized edema: Secondary | ICD-10-CM | POA: Diagnosis not present

## 2023-06-13 DIAGNOSIS — M6281 Muscle weakness (generalized): Secondary | ICD-10-CM

## 2023-06-13 DIAGNOSIS — M25561 Pain in right knee: Secondary | ICD-10-CM | POA: Diagnosis not present

## 2023-06-13 DIAGNOSIS — R293 Abnormal posture: Secondary | ICD-10-CM | POA: Diagnosis not present

## 2023-06-13 NOTE — Therapy (Signed)
 OUTPATIENT PHYSICAL THERAPY LOWER EXTREMITY TREATMENT   Patient Name: Katelyn Peterson MRN: [id number] DOB:09-Dec-1945, 78 y.o., female Today's Date: 06/13/2023  END OF SESSION:  PT End of Session - 06/13/23 1508     Visit Number 23    Date for PT Re-Evaluation 06/28/23    Authorization Type [facility] [employer]    PT Start Time 1447    PT Stop Time 1540    PT Time Calculation (min) 53 min    Activity Tolerance Patient tolerated treatment well    Behavior During Therapy WFL for tasks assessed/performed                 Past Medical History:  Diagnosis Date   Anemia    Arthritis    HTN (hypertension)    Hyperlipidemia    Obesity    Type II or unspecified type diabetes mellitus without mention of complication, not stated as uncontrolled    Past Surgical History:  Procedure Laterality Date   ABDOMINAL HYSTERECTOMY  1985   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   Procedure: TOTAL KNEE ARTHROPLASTY;  Surgeon: Ollen Gross, MD;  Location: [facility];  Service: Orthopedics;  Laterality: Right;   Patient Active Problem List   Diagnosis Date Noted   OA (osteoarthritis) of knee 03/18/2023   Primary osteoarthritis of right knee 03/18/2023   Preop exam for internal medicine 12/20/2022   Nuclear sclerotic cataract of right eye 12/07/2022   B12 deficiency 06/20/2022   Colon polyps 06/19/2022   Arthritis of left foot 04/11/2021   CRI (chronic renal insufficiency), stage 3 (moderate) (HCC) 12/15/2020   Wrist pain, left 12/10/2018   Knee pain, right 06/19/2017   Anemia 05/08/2016   Onychomycosis 06/22/2015   Arthritis of ankle 04/01/2015   Well adult exam 08/16/2014   Goiter 11/04/2012   Left ankle pain 08/22/2011   Grief 04/20/2011   Rhinitis 04/20/2011   ABDOMINAL DISTENSION 03/09/2010   KNEE PAIN 03/04/2008   LEG CRAMPS 09/03/2007   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) 04/11/2007   Obesity 04/11/2007   Dyslipidemia  associated with type 2 diabetes mellitus (HCC) 11/26/2006   Essential hypertension 11/26/2006    PCP: Tresa Garter, MD   REFERRING PROVIDER: Derenda Fennel, PA  REFERRING DIAG: M17.11 (ICD-10-CM) - Unilateral primary osteoarthritis, right knee  THERAPY DIAG:  Stiffness of right knee, not elsewhere classified  Chronic pain of right knee  Difficulty in walking, not elsewhere classified  Muscle weakness (generalized)  Cramp and spasm  Abnormal posture  Localized edema  Rationale for Evaluation and Treatment: Rehabilitation  ONSET DATE: 03/18/2023  SUBJECTIVE:   SUBJECTIVE STATEMENT: Patient reports just continued stiffness.         From Eval: Patient presents post Rt TKA on 1/6. She has been walking once a day for 10 minutes. She was not given any exercises. She has increased pain with functional movements (sit to stand, getting in and out of a car, etc). She does not have any pain if her Rt knee if it is extended. She wants to get back to walking normally when running errands.  PERTINENT HISTORY: HTN, Anemia; Type II diabetes   PAIN: 06/13/2023 Are you having pain? Yes: NPRS scale: 0/10 Pain location: Rt leg Pain description: achy dull Aggravating factors: getting out car; sitting down with knee flexed Relieving factors: Pain medication  PRECAUTIONS: Other: Status post Rt TKA 03/18/2023  RED FLAGS: None   WEIGHT BEARING RESTRICTIONS: No  FALLS:  Has patient fallen in last 6 months? No  LIVING ENVIRONMENT: Lives with: lives with their family Lives in: House/apartment Stairs: Yes: Internal: 1 steps; none Has following equipment at home: Dan Humphreys - 2 wheeled and shower chair  OCCUPATION: Psychologist, sport and exercise at [employer]  PLOF: Independent and Independent with basic ADLs  PATIENT GOALS: To get the pain gone  NEXT MD VISIT: 04/02/2023  OBJECTIVE:  Note: Objective measures were completed at Evaluation unless otherwise noted.   PATIENT SURVEYS:  FOTO 31; goal  34  COGNITION: Overall cognitive status: Within functional limits for tasks assessed     SENSATION: Rt knee Numbness   EDEMA:  Rt knee 3 cm above patella: 54.5cm 3 cam below patella:51cm At patella : 51.5 cm   POSTURE: rounded shoulders and forward head   LOWER EXTREMITY ROM: seated in wheelchair  Active ROM Right eval 03/14/2023  04/16/2023 Left eval  Hip flexion      Hip extension      Hip abduction      Hip adduction      Hip internal rotation      Hip external rotation      Knee flexion 56 AAROM 90 (semi-reclined) PROM 95    Knee extension -2 -15 -5   Ankle dorsiflexion      Ankle plantarflexion      Ankle inversion      Ankle eversion       (Blank rows = not tested)  LOWER EXTREMITY MMT: Deferred      FUNCTIONAL TESTS:  5 times sit to stand: 24.11 with Rt knee extended & UE support 03/26/2023 TUG: 42.59 03/27/2023  with front wheeled walker 80ft  04/18/2023 3MWT:369ft  05/14/2023 5STS: 10.09 sec with UE support TUG: 11.69 sec with straight cane : 596FT str cane  GAIT: Patient did not ambulate during treatment session due to increased pain. Patient required max assistance with car transfer                                                                                                                              TREATMENT DATE:  06/13/2023  Nustep x 9 min at level 5 Checked for pitting edema Supine quad set x 20 Supine with heel propped on yoga block manual extension stretch x 10 holding 10  Supine manual hamstring stretch x 10 holding 10 sec Supine heel slides flex/ext x 20 Supine hip abduction x 20 SLR 2 x 10 Seated LAQ x 20 Vasopneumatic compression Rt knee moderate pressure x 15 minutes (right leg up on wedge to assist with edema)  06/11/2023  Nustep x 9 min at level 5 Rocker board x 2 min  TKE with light blue (50 lb) NZG band while doing step ups on 6" step 2 x 10 Eccentric heel taps right LE off 6" step 2 x 10 good form, no pain  reported Step ups 2 x 10 on 6" step fwd Lateral band walks with yellow  loop x 5 laps at barre Seated clam x 20 with yellow loop  Seated LAQ x 20 with 5 lbs both Seated march x 20 with 5 lbs both Seated hip ER 2 x 10 with 5 lbs both Vasopneumatic compression Rt knee moderate pressure x 15 minutes (right leg out straight to assist with extension)  06/06/2023 (still on intracranial pressure precautions from her eye surgery) Nustep x 5 min at level 5 Rocker board x 2 min  Gastroc stretch x 10 holding 10 sec Right soleus stretch x 10 holding 10 sec Right TKE with purple serious steel band (heavy) Seated hamstring stretch with heel propped on foot stool x 10 holding 10 sec each Seated heel slides for flexion ROM Seated with leg outstretched: quad sets x 20 Standing hip abduction bilateral 0# 2 x 10 each Seated leg outstretched quad set x 20 Vasopneumatic compression Rt knee moderate pressure x 15 minutes (right leg out straight to assist with extension)   PATIENT EDUCATION:  Education details: HEP; POC Person educated: Patient Education method: Programmer, multimedia, Demonstration, and Handouts Education comprehension: verbalized understanding, returned demonstration, and needs further education  HOME EXERCISE PROGRAM: Access Code: [email or web address] URL: [email or web address] Date: 04/09/2023 Prepared by: Claude Manges  Exercises - Seated Heel Slide  - 1 x daily - 7 x weekly - 2 sets - 10 reps - Sitting Heel Slide with Towel  - 1 x daily - 7 x weekly - 2 sets - 10 reps - Supine Quad Set  - 1 x daily - 7 x weekly - 2 sets - 10 reps - 2-3 hold - Active Straight Leg Raise with Quad Set  - 1 x daily - 7 x weekly - 2 sets - 10 reps - Seated Hamstring Stretch  - 1 x daily - 7 x weekly - 2 sets - 10 reps - Standing March with Counter Support  - 1 x daily - 7 x weekly - 1 sets - 10 reps - Standing Hip Abduction with Counter Support  - 1 x daily - 7 x weekly - 1 sets - 10 reps - Supine Knee  Extension Stretch on Towel Roll  - 1 x daily - 7 x weekly - 2 sets - 10 reps - Seated Knee Extension Stretch with Chair  - 1 x daily - 7 x weekly - 2 sets - 30 hold  ASSESSMENT:  CLINICAL IMPRESSION: Claris Che is struggling with edema and stiffness.  She does have pitting edema in both legs R > L.  She is doing all of her propping and stretching at home as directed.  This continued edema is likely due to decreased cardiac output lingering post surgery, returning to work early and having leg in dependent position and lack of cardiovascular exercise.  We encouraged her to engage in more cardiovascular exercise to improve her cardiac efficiency.  She would benefit from continued skilled PT for ROM, edema control, quad rehab and hip strengthening to reduce trendelenburg gait.   .  OBJECTIVE IMPAIRMENTS: Abnormal gait, decreased mobility, difficulty walking, decreased ROM, decreased strength, increased edema, increased muscle spasms, impaired flexibility, postural dysfunction, obesity, and pain.   ACTIVITY LIMITATIONS: lifting, standing, squatting, stairs, transfers, and bed mobility  PARTICIPATION LIMITATIONS: cleaning, laundry, driving, shopping, community activity, and occupation  PERSONAL FACTORS: Fitness and 1-2 comorbidities: HTN, Anemia; Type II diabetes   are also affecting patient's functional outcome.   REHAB POTENTIAL: Good  CLINICAL DECISION MAKING: Stable/uncomplicated  EVALUATION COMPLEXITY: Low   GOALS: Goals reviewed with patient? Yes  SHORT TERM GOALS: Target date: 05/02/2023  Patient will be independent with initial HEP. Baseline:  Goal status: MET 04/18/2023  2.  Patient will be able to complete a car transfer with < or = to 2/10 knee pain. Baseline:  Goal status: MET 05/16/23  3.  Patient will demonstrate > or = to 90 degrees of knee flexion for improved sit to stand transfer. Baseline:  Goal status: MET 04/18/2023    LONG TERM GOALS: Target date: 06/28/2023 Patient  will demonstrate independence in advanced HEP. Baseline:  Goal status: In progress  2.  Patient will be able to ascend a curb/ step for safe community negotiation. Baseline:  Goal status: MET 05/07/23  3.  Patient will be able to return to work and complete all work duties. Baseline:  Goal status: MET 06/11/23  4.  Patient will score < or = to 19 sec on 5 STS for improved functional mobility. Baseline:  Goal status: MET 05/14/2023  5.  Patient will score > or = to 61 on FOTO for improved function.  Baseline:  Goal status: INITIAL  6.  Patient will be able to walk in the community and run errands with < or = to 2/10 knee pain. Baseline:  Goal status: MET 06/13/23   PLAN:  PT FREQUENCY: 2x/week  PT DURATION: 12 weeks  PLANNED INTERVENTIONS: 97164- PT Re-evaluation, 97110-Therapeutic exercises, 97530- Therapeutic activity, 97112- Neuromuscular re-education, 97535- Self Care, 57846- Manual therapy, [id number]- Gait training, [phone]- Aquatic Therapy, 97014- Electrical stimulation (unattended), [phone]- Electrical stimulation (manual), [id number]- Vasopneumatic device, [id number]- Ultrasound, [id number]- Traction (mechanical), [id number]- Ionotophoresis 4mg /ml Dexamethasone, Patient/Family education, Balance training, Stair training, Taping, Dry Needling, Joint mobilization, Joint manipulation, Spinal manipulation, Spinal mobilization, Scar mobilization, Vestibular training, Cryotherapy, and Moist heat  PLAN FOR NEXT SESSION: Possibly incorporate some cardiac exercise with greater intensity, continue to focus on regaining full extension,  continue functional strengthening & Rt knee flexion mobility  Damel Querry B. Deadra Diggins, PT 06/13/23 3:37 PM [facility] [address], [address] [place], [place] [address] Phone # [phone] Fax [phone]

## 2023-06-18 ENCOUNTER — Encounter: Payer: Self-pay | Admitting: Physical Therapy

## 2023-06-18 ENCOUNTER — Ambulatory Visit: Payer: Commercial Managed Care - PPO | Admitting: Physical Therapy

## 2023-06-18 ENCOUNTER — Other Ambulatory Visit (HOSPITAL_COMMUNITY): Payer: Self-pay

## 2023-06-18 DIAGNOSIS — G8929 Other chronic pain: Secondary | ICD-10-CM | POA: Diagnosis not present

## 2023-06-18 DIAGNOSIS — R252 Cramp and spasm: Secondary | ICD-10-CM | POA: Diagnosis not present

## 2023-06-18 DIAGNOSIS — R262 Difficulty in walking, not elsewhere classified: Secondary | ICD-10-CM

## 2023-06-18 DIAGNOSIS — R600 Localized edema: Secondary | ICD-10-CM

## 2023-06-18 DIAGNOSIS — M25661 Stiffness of right knee, not elsewhere classified: Secondary | ICD-10-CM

## 2023-06-18 DIAGNOSIS — M6281 Muscle weakness (generalized): Secondary | ICD-10-CM | POA: Diagnosis not present

## 2023-06-18 DIAGNOSIS — R293 Abnormal posture: Secondary | ICD-10-CM

## 2023-06-18 DIAGNOSIS — M25561 Pain in right knee: Secondary | ICD-10-CM | POA: Diagnosis not present

## 2023-06-18 NOTE — Therapy (Signed)
 OUTPATIENT PHYSICAL THERAPY LOWER EXTREMITY TREATMENT   Patient Name: Katelyn Peterson MRN: [id number] DOB:09-27-1945, 78 y.o., female Today's Date: 06/18/2023  END OF SESSION:  PT End of Session - 06/18/23 1558     Visit Number 24    Date for PT Re-Evaluation 06/28/23    Authorization Type [facility] [employer]    PT Start Time 1447    PT Stop Time 1541    PT Time Calculation (min) 54 min    Activity Tolerance Patient tolerated treatment well    Behavior During Therapy WFL for tasks assessed/performed                  Past Medical History:  Diagnosis Date   Anemia    Arthritis    HTN (hypertension)    Hyperlipidemia    Obesity    Type II or unspecified type diabetes mellitus without mention of complication, not stated as uncontrolled    Past Surgical History:  Procedure Laterality Date   ABDOMINAL HYSTERECTOMY  1985   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   Procedure: TOTAL KNEE ARTHROPLASTY;  Surgeon: Ollen Gross, MD;  Location: [facility];  Service: Orthopedics;  Laterality: Right;   Patient Active Problem List   Diagnosis Date Noted   OA (osteoarthritis) of knee 03/18/2023   Primary osteoarthritis of right knee 03/18/2023   Preop exam for internal medicine 12/20/2022   Nuclear sclerotic cataract of right eye 12/07/2022   B12 deficiency 06/20/2022   Colon polyps 06/19/2022   Arthritis of left foot 04/11/2021   CRI (chronic renal insufficiency), stage 3 (moderate) (HCC) 12/15/2020   Wrist pain, left 12/10/2018   Knee pain, right 06/19/2017   Anemia 05/08/2016   Onychomycosis 06/22/2015   Arthritis of ankle 04/01/2015   Well adult exam 08/16/2014   Goiter 11/04/2012   Left ankle pain 08/22/2011   Grief 04/20/2011   Rhinitis 04/20/2011   ABDOMINAL DISTENSION 03/09/2010   KNEE PAIN 03/04/2008   LEG CRAMPS 09/03/2007   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) 04/11/2007   Obesity 04/11/2007   Dyslipidemia  associated with type 2 diabetes mellitus (HCC) 11/26/2006   Essential hypertension 11/26/2006    PCP: Tresa Garter, MD   REFERRING PROVIDER: Derenda Fennel, PA  REFERRING DIAG: M17.11 (ICD-10-CM) - Unilateral primary osteoarthritis, right knee  THERAPY DIAG:  Stiffness of right knee, not elsewhere classified  Chronic pain of right knee  Difficulty in walking, not elsewhere classified  Muscle weakness (generalized)  Cramp and spasm  Abnormal posture  Localized edema  Rationale for Evaluation and Treatment: Rehabilitation  ONSET DATE: 03/18/2023  SUBJECTIVE:   SUBJECTIVE STATEMENT: Patient reports she is doing good today. She is not currently having any pain.    From Eval: Patient presents post Rt TKA on 1/6. She has been walking once a day for 10 minutes. She was not given any exercises. She has increased pain with functional movements (sit to stand, getting in and out of a car, etc). She does not have any pain if her Rt knee if it is extended. She wants to get back to walking normally when running errands.  PERTINENT HISTORY: HTN, Anemia; Type II diabetes   PAIN: 06/18/2023 Are you having pain? Yes: NPRS scale: 0/10 Pain location: Rt leg Pain description: achy dull Aggravating factors: getting out car; sitting down with knee flexed Relieving factors: Pain medication  PRECAUTIONS: Other: Status post Rt TKA 03/18/2023  RED FLAGS: None   WEIGHT BEARING  RESTRICTIONS: No  FALLS:  Has patient fallen in last 6 months? No  LIVING ENVIRONMENT: Lives with: lives with their family Lives in: House/apartment Stairs: Yes: Internal: 1 steps; none Has following equipment at home: Dan Humphreys - 2 wheeled and shower chair  OCCUPATION: Psychologist, sport and exercise at [employer]  PLOF: Independent and Independent with basic ADLs  PATIENT GOALS: To get the pain gone  NEXT MD VISIT: 04/02/2023  OBJECTIVE:  Note: Objective measures were completed at Evaluation unless otherwise  noted.   PATIENT SURVEYS:  FOTO 31; goal 24  COGNITION: Overall cognitive status: Within functional limits for tasks assessed     SENSATION: Rt knee Numbness   EDEMA:  Rt knee 3 cm above patella: 54.5cm 3 cam below patella:51cm At patella : 51.5 cm   POSTURE: rounded shoulders and forward head   LOWER EXTREMITY ROM: seated in wheelchair  Active ROM Right eval 03/14/2023  04/16/2023 Left eval  Hip flexion      Hip extension      Hip abduction      Hip adduction      Hip internal rotation      Hip external rotation      Knee flexion 56 AAROM 90 (semi-reclined) PROM 95    Knee extension -2 -15 -5   Ankle dorsiflexion      Ankle plantarflexion      Ankle inversion      Ankle eversion       (Blank rows = not tested)  LOWER EXTREMITY MMT: Deferred      FUNCTIONAL TESTS:  5 times sit to stand: 24.11 with Rt knee extended & UE support 03/26/2023 TUG: 42.59 03/27/2023  with front wheeled walker 36ft  04/18/2023 3MWT:35ft  05/14/2023 5STS: 10.09 sec with UE support TUG: 11.69 sec with straight cane : 596FT str cane  GAIT: Patient did not ambulate during treatment session due to increased pain. Patient required max assistance with car transfer                                                                                                                              TREATMENT DATE:  06/18/2023  Nustep x 10 min at level 5 Supine quad set x 20 SLR 2 x 10 Supine hamstring stretch with green strap 2 x 30sec on Rt Supine manual hamstring stretch x 10 holding 10 sec Supine with heel propped on yoga block manual extension stretch x 10 holding 10  Gastroc stretch on rocker board 3 x 30sec Hip matrix (hip abduction & flexion) 25# x 10 bilateral  Seated LAQ 5# AW x 20 bilateral  High marching down PT hallway with 5# AW x 1 lap TKE with blue 50lb long loop x 20 5 sec holds Vasopneumatic compression Rt knee moderate pressure x 10 minutes (right leg up on wedge to  assist with edema)   06/13/2023  Nustep x 9 min at level 5 Checked for pitting edema Supine quad set  x 20 Supine with heel propped on yoga block manual extension stretch x 10 holding 10  Supine manual hamstring stretch x 10 holding 10 sec Supine heel slides flex/ext x 20 Supine hip abduction x 20 SLR 2 x 10 Seated LAQ x 20 Vasopneumatic compression Rt knee moderate pressure x 15 minutes (right leg up on wedge to assist with edema)  06/11/2023  Nustep x 9 min at level 5 Rocker board x 2 min  TKE with light blue (50 lb) NZG band while doing step ups on 6" step 2 x 10 Eccentric heel taps right LE off 6" step 2 x 10 good form, no pain reported Step ups 2 x 10 on 6" step fwd Lateral band walks with yellow loop x 5 laps at barre Seated clam x 20 with yellow loop  Seated LAQ x 20 with 5 lbs both Seated march x 20 with 5 lbs both Seated hip ER 2 x 10 with 5 lbs both Vasopneumatic compression Rt knee moderate pressure x 15 minutes (right leg out straight to assist with extension)    PATIENT EDUCATION:  Education details: HEP; POC Person educated: Patient Education method: Explanation, Demonstration, and Handouts Education comprehension: verbalized understanding, returned demonstration, and needs further education  HOME EXERCISE PROGRAM: Access Code: [email or web address] URL: [email or web address] Date: 04/09/2023 Prepared by: Claude Manges  Exercises - Seated Heel Slide  - 1 x daily - 7 x weekly - 2 sets - 10 reps - Sitting Heel Slide with Towel  - 1 x daily - 7 x weekly - 2 sets - 10 reps - Supine Quad Set  - 1 x daily - 7 x weekly - 2 sets - 10 reps - 2-3 hold - Active Straight Leg Raise with Quad Set  - 1 x daily - 7 x weekly - 2 sets - 10 reps - Seated Hamstring Stretch  - 1 x daily - 7 x weekly - 2 sets - 10 reps - Standing March with Counter Support  - 1 x daily - 7 x weekly - 1 sets - 10 reps - Standing Hip Abduction with Counter Support  - 1 x daily - 7 x weekly - 1 sets -  10 reps - Supine Knee Extension Stretch on Towel Roll  - 1 x daily - 7 x weekly - 2 sets - 10 reps - Seated Knee Extension Stretch with Chair  - 1 x daily - 7 x weekly - 2 sets - 30 hold  ASSESSMENT:  CLINICAL IMPRESSION: Claris Che presents to therapy with no pain, but she complains of tightness in her calf. She is still lacking full Rt knee extension. She tolerated treatment session well and did not verbalize any increased pain or discomfort. Due to the pants she had on we were unable to recheck patient's edema. Patient required minimal verbal cues for form correction. Patient will benefit from skilled PT to address the below impairments and improve overall function.  .  OBJECTIVE IMPAIRMENTS: Abnormal gait, decreased mobility, difficulty walking, decreased ROM, decreased strength, increased edema, increased muscle spasms, impaired flexibility, postural dysfunction, obesity, and pain.   ACTIVITY LIMITATIONS: lifting, standing, squatting, stairs, transfers, and bed mobility  PARTICIPATION LIMITATIONS: cleaning, laundry, driving, shopping, community activity, and occupation  PERSONAL FACTORS: Fitness and 1-2 comorbidities: HTN, Anemia; Type II diabetes   are also affecting patient's functional outcome.   REHAB POTENTIAL: Good  CLINICAL DECISION MAKING: Stable/uncomplicated  EVALUATION COMPLEXITY: Low   GOALS: Goals reviewed with patient? Yes  SHORT TERM GOALS: Target date:  05/02/2023  Patient will be independent with initial HEP. Baseline:  Goal status: MET 04/18/2023  2.  Patient will be able to complete a car transfer with < or = to 2/10 knee pain. Baseline:  Goal status: MET 05/16/23  3.  Patient will demonstrate > or = to 90 degrees of knee flexion for improved sit to stand transfer. Baseline:  Goal status: MET 04/18/2023    LONG TERM GOALS: Target date: 06/28/2023 Patient will demonstrate independence in advanced HEP. Baseline:  Goal status: In progress  2.  Patient will  be able to ascend a curb/ step for safe community negotiation. Baseline:  Goal status: MET 05/07/23  3.  Patient will be able to return to work and complete all work duties. Baseline:  Goal status: MET 06/11/23  4.  Patient will score < or = to 19 sec on 5 STS for improved functional mobility. Baseline:  Goal status: MET 05/14/2023  5.  Patient will score > or = to 61 on FOTO for improved function.  Baseline:  Goal status: INITIAL  6.  Patient will be able to walk in the community and run errands with < or = to 2/10 knee pain. Baseline:  Goal status: MET 06/13/23   PLAN:  PT FREQUENCY: 2x/week  PT DURATION: 12 weeks  PLANNED INTERVENTIONS: 97164- PT Re-evaluation, 97110-Therapeutic exercises, 97530- Therapeutic activity, 97112- Neuromuscular re-education, 97535- Self Care, 91478- Manual therapy, [phone]- Gait training, [phone]- Aquatic Therapy, 97014- Electrical stimulation (unattended), [phone]- Electrical stimulation (manual), 97016- Vasopneumatic device, [id number]- Ultrasound, [id number]- Traction (mechanical), [id number]- Ionotophoresis 4mg /ml Dexamethasone, Patient/Family education, Balance training, Stair training, Taping, Dry Needling, Joint mobilization, Joint manipulation, Spinal manipulation, Spinal mobilization, Scar mobilization, Vestibular training, Cryotherapy, and Moist heat  PLAN FOR NEXT SESSION: greater intensity cardio, continue to focus on regaining full extension,  continue functional strengthening & Rt knee flexion mobility  Claude Manges, PT 06/18/23 4:03 PM [facility] [address] [place], [place] [address] Phone # [phone] Fax [phone]

## 2023-06-20 ENCOUNTER — Ambulatory Visit: Payer: Commercial Managed Care - PPO | Admitting: Internal Medicine

## 2023-06-20 ENCOUNTER — Ambulatory Visit: Payer: Commercial Managed Care - PPO | Admitting: Physical Therapy

## 2023-06-20 ENCOUNTER — Encounter: Payer: Self-pay | Admitting: Physical Therapy

## 2023-06-20 DIAGNOSIS — M25661 Stiffness of right knee, not elsewhere classified: Secondary | ICD-10-CM | POA: Diagnosis not present

## 2023-06-20 DIAGNOSIS — R600 Localized edema: Secondary | ICD-10-CM | POA: Diagnosis not present

## 2023-06-20 DIAGNOSIS — M25561 Pain in right knee: Secondary | ICD-10-CM | POA: Diagnosis not present

## 2023-06-20 DIAGNOSIS — M6281 Muscle weakness (generalized): Secondary | ICD-10-CM | POA: Diagnosis not present

## 2023-06-20 DIAGNOSIS — R293 Abnormal posture: Secondary | ICD-10-CM

## 2023-06-20 DIAGNOSIS — R262 Difficulty in walking, not elsewhere classified: Secondary | ICD-10-CM | POA: Diagnosis not present

## 2023-06-20 DIAGNOSIS — G8929 Other chronic pain: Secondary | ICD-10-CM | POA: Diagnosis not present

## 2023-06-20 DIAGNOSIS — R252 Cramp and spasm: Secondary | ICD-10-CM | POA: Diagnosis not present

## 2023-06-20 NOTE — Therapy (Signed)
 OUTPATIENT PHYSICAL THERAPY LOWER EXTREMITY TREATMENT   Patient Name: Katelyn Peterson MRN: [id number] DOB:12-30-1945, 78 y.o., female Today's Date: 06/20/2023  END OF SESSION:  PT End of Session - 06/20/23 1600     Visit Number 25    Date for PT Re-Evaluation 06/28/23    Authorization Type [facility] [employer]    PT Start Time [phone]    PT Stop Time 1545    PT Time Calculation (min) 57 min    Activity Tolerance Patient tolerated treatment well    Behavior During Therapy WFL for tasks assessed/performed                   Past Medical History:  Diagnosis Date   Anemia    Arthritis    HTN (hypertension)    Hyperlipidemia    Obesity    Type II or unspecified type diabetes mellitus without mention of complication, not stated as uncontrolled    Past Surgical History:  Procedure Laterality Date   ABDOMINAL HYSTERECTOMY  1985   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   Procedure: TOTAL KNEE ARTHROPLASTY;  Surgeon: Ollen Gross, MD;  Location: [facility];  Service: Orthopedics;  Laterality: Right;   Patient Active Problem List   Diagnosis Date Noted   OA (osteoarthritis) of knee 03/18/2023   Primary osteoarthritis of right knee 03/18/2023   Preop exam for internal medicine 12/20/2022   Nuclear sclerotic cataract of right eye 12/07/2022   B12 deficiency 06/20/2022   Colon polyps 06/19/2022   Arthritis of left foot 04/11/2021   CRI (chronic renal insufficiency), stage 3 (moderate) (HCC) 12/15/2020   Wrist pain, left 12/10/2018   Knee pain, right 06/19/2017   Anemia 05/08/2016   Onychomycosis 06/22/2015   Arthritis of ankle 04/01/2015   Well adult exam 08/16/2014   Goiter 11/04/2012   Left ankle pain 08/22/2011   Grief 04/20/2011   Rhinitis 04/20/2011   ABDOMINAL DISTENSION 03/09/2010   KNEE PAIN 03/04/2008   LEG CRAMPS 09/03/2007   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) 04/11/2007   Obesity 04/11/2007   Dyslipidemia  associated with type 2 diabetes mellitus (HCC) 11/26/2006   Essential hypertension 11/26/2006    PCP: Tresa Garter, MD   REFERRING PROVIDER: Derenda Fennel, PA  REFERRING DIAG: M17.11 (ICD-10-CM) - Unilateral primary osteoarthritis, right knee  THERAPY DIAG:  Stiffness of right knee, not elsewhere classified  Chronic pain of right knee  Difficulty in walking, not elsewhere classified  Cramp and spasm  Abnormal posture  Muscle weakness (generalized)  Localized edema  Rationale for Evaluation and Treatment: Rehabilitation  ONSET DATE: 03/18/2023  SUBJECTIVE:   SUBJECTIVE STATEMENT: Patient reports she is doing good today. She is not currently having any pain.  From Eval: Patient presents post Rt TKA on 1/6. She has been walking once a day for 10 minutes. She was not given any exercises. She has increased pain with functional movements (sit to stand, getting in and out of a car, etc). She does not have any pain if her Rt knee if it is extended. She wants to get back to walking normally when running errands.  PERTINENT HISTORY: HTN, Anemia; Type II diabetes   PAIN: 06/20/2023 Are you having pain? Yes: NPRS scale: 0/10 Pain location: Rt leg Pain description: achy dull Aggravating factors: getting out car; sitting down with knee flexed Relieving factors: Pain medication  PRECAUTIONS: Other: Status post Rt TKA 03/18/2023  RED FLAGS: None   WEIGHT BEARING RESTRICTIONS:  No  FALLS:  Has patient fallen in last 6 months? No  LIVING ENVIRONMENT: Lives with: lives with their family Lives in: House/apartment Stairs: Yes: Internal: 1 steps; none Has following equipment at home: Dan Humphreys - 2 wheeled and shower chair  OCCUPATION: Psychologist, sport and exercise at [employer]  PLOF: Independent and Independent with basic ADLs  PATIENT GOALS: To get the pain gone  NEXT MD VISIT: 04/02/2023  OBJECTIVE:  Note: Objective measures were completed at Evaluation unless otherwise  noted.   PATIENT SURVEYS:  FOTO 31; goal 30  COGNITION: Overall cognitive status: Within functional limits for tasks assessed     SENSATION: Rt knee Numbness   EDEMA:  Rt knee 3 cm above patella: 54.5cm 3 cam below patella:51cm At patella : 51.5 cm   POSTURE: rounded shoulders and forward head   LOWER EXTREMITY ROM: seated in wheelchair  Active ROM Right eval 03/14/2023  04/16/2023 Left eval  Hip flexion      Hip extension      Hip abduction      Hip adduction      Hip internal rotation      Hip external rotation      Knee flexion 56 AAROM 90 (semi-reclined) PROM 95    Knee extension -2 -15 -5   Ankle dorsiflexion      Ankle plantarflexion      Ankle inversion      Ankle eversion       (Blank rows = not tested)  LOWER EXTREMITY MMT: Deferred      FUNCTIONAL TESTS:  5 times sit to stand: 24.11 with Rt knee extended & UE support 03/26/2023 TUG: 42.59 03/27/2023  with front wheeled walker 83ft  04/18/2023 3MWT:349ft  05/14/2023 5STS: 10.09 sec with UE support TUG: 11.69 sec with straight cane : 596FT str cane  GAIT: Patient did not ambulate during treatment session due to increased pain. Patient required max assistance with car transfer                                                                                                                              TREATMENT DATE:  06/20/2023  Nustep x 10 min at level 7 Eccentric heel taps right LE off 6" with 2 in step on ground  2 x 10 Rt  6in step ups x 10 each leg Leg Press 60# bilateral 2 x10 (seat 7) TKE with blue 50lb long loop x 20 5 sec holds Seated LAQ 5# AW x 20 bilateral  Edema assessment Prone quad stretch with strap 3 x 30 sec  Manual: roller to Rt distal hamstring & proximal calf  Vasopneumatic compression Rt knee moderate pressure x 10 minutes (right leg up on wedge to assist with edema)    06/18/2023  Nustep x 10 min at level 5 Supine quad set x 20 SLR 2 x 10 Supine hamstring  stretch with green strap 2 x 30sec on Rt Supine manual hamstring stretch x  10 holding 10 sec Supine with heel propped on yoga block manual extension stretch x 10 holding 10  Gastroc stretch on rocker board 3 x 30sec Hip matrix (hip abduction & flexion) 25# x 10 bilateral  Seated LAQ 5# AW x 20 bilateral  High marching down PT hallway with 5# AW x 1 lap TKE with blue 50lb long loop x 20 5 sec holds Vasopneumatic compression Rt knee moderate pressure x 10 minutes (right leg up on wedge to assist with edema)   06/13/2023  Nustep x 9 min at level 5 Checked for pitting edema Supine quad set x 20 Supine with heel propped on yoga block manual extension stretch x 10 holding 10  Supine manual hamstring stretch x 10 holding 10 sec Supine heel slides flex/ext x 20 Supine hip abduction x 20 SLR 2 x 10 Seated LAQ x 20 Vasopneumatic compression Rt knee moderate pressure x 15 minutes (right leg up on wedge to assist with edema)  06/11/2023  Nustep x 9 min at level 5 Rocker board x 2 min  TKE with light blue (50 lb) NZG band while doing step ups on 6" step 2 x 10 Eccentric heel taps right LE off 6" step 2 x 10 good form, no pain reported Step ups 2 x 10 on 6" step fwd Lateral band walks with yellow loop x 5 laps at barre Seated clam x 20 with yellow loop  Seated LAQ x 20 with 5 lbs both Seated march x 20 with 5 lbs both Seated hip ER 2 x 10 with 5 lbs both Vasopneumatic compression Rt knee moderate pressure x 15 minutes (right leg out straight to assist with extension)    PATIENT EDUCATION:  Education details: HEP; POC Person educated: Patient Education method: Explanation, Demonstration, and Handouts Education comprehension: verbalized understanding, returned demonstration, and needs further education  HOME EXERCISE PROGRAM: Access Code: [email or web address] URL: [email or web address] Date: 04/09/2023 Prepared by: Claude Manges  Exercises - Seated Heel Slide  - 1 x daily - 7 x weekly  - 2 sets - 10 reps - Sitting Heel Slide with Towel  - 1 x daily - 7 x weekly - 2 sets - 10 reps - Supine Quad Set  - 1 x daily - 7 x weekly - 2 sets - 10 reps - 2-3 hold - Active Straight Leg Raise with Quad Set  - 1 x daily - 7 x weekly - 2 sets - 10 reps - Seated Hamstring Stretch  - 1 x daily - 7 x weekly - 2 sets - 10 reps - Standing March with Counter Support  - 1 x daily - 7 x weekly - 1 sets - 10 reps - Standing Hip Abduction with Counter Support  - 1 x daily - 7 x weekly - 1 sets - 10 reps - Supine Knee Extension Stretch on Towel Roll  - 1 x daily - 7 x weekly - 2 sets - 10 reps - Seated Knee Extension Stretch with Chair  - 1 x daily - 7 x weekly - 2 sets - 30 hold  ASSESSMENT:  CLINICAL IMPRESSION: Claris Che presents to therapy with no pain today. Continued quad strengthening and right knee joint mobility exercises. With eccentric step down exercises patient required a 2 inch step to be placed on the ground to compensate for quad weakness. Patient compensated with rotating her trunk. Incorporated leg press today and patient tolerated it well. Patient is progressing well post TKA. Patient will benefit from skilled PT to address  the below impairments and improve overall function.   .  OBJECTIVE IMPAIRMENTS: Abnormal gait, decreased mobility, difficulty walking, decreased ROM, decreased strength, increased edema, increased muscle spasms, impaired flexibility, postural dysfunction, obesity, and pain.   ACTIVITY LIMITATIONS: lifting, standing, squatting, stairs, transfers, and bed mobility  PARTICIPATION LIMITATIONS: cleaning, laundry, driving, shopping, community activity, and occupation  PERSONAL FACTORS: Fitness and 1-2 comorbidities: HTN, Anemia; Type II diabetes   are also affecting patient's functional outcome.   REHAB POTENTIAL: Good  CLINICAL DECISION MAKING: Stable/uncomplicated  EVALUATION COMPLEXITY: Low   GOALS: Goals reviewed with patient? Yes  SHORT TERM GOALS:  Target date: 05/02/2023  Patient will be independent with initial HEP. Baseline:  Goal status: MET 04/18/2023  2.  Patient will be able to complete a car transfer with < or = to 2/10 knee pain. Baseline:  Goal status: MET 05/16/23  3.  Patient will demonstrate > or = to 90 degrees of knee flexion for improved sit to stand transfer. Baseline:  Goal status: MET 04/18/2023    LONG TERM GOALS: Target date: 06/28/2023 Patient will demonstrate independence in advanced HEP. Baseline:  Goal status: In progress  2.  Patient will be able to ascend a curb/ step for safe community negotiation. Baseline:  Goal status: MET 05/07/23  3.  Patient will be able to return to work and complete all work duties. Baseline:  Goal status: MET 06/11/23  4.  Patient will score < or = to 19 sec on 5 STS for improved functional mobility. Baseline:  Goal status: MET 05/14/2023  5.  Patient will score > or = to 61 on FOTO for improved function.  Baseline:  Goal status: INITIAL  6.  Patient will be able to walk in the community and run errands with < or = to 2/10 knee pain. Baseline:  Goal status: MET 06/13/23   PLAN:  PT FREQUENCY: 2x/week  PT DURATION: 12 weeks  PLANNED INTERVENTIONS: 97164- PT Re-evaluation, 97110-Therapeutic exercises, 97530- Therapeutic activity, 97112- Neuromuscular re-education, 97535- Self Care, 95621- Manual therapy, [phone]- Gait training, [phone]- Aquatic Therapy, 97014- Electrical stimulation (unattended), [phone]- Electrical stimulation (manual), 97016- Vasopneumatic device, [id number]- Ultrasound, [id number]- Traction (mechanical), [id number]- Ionotophoresis 4mg /ml Dexamethasone, Patient/Family education, Balance training, Stair training, Taping, Dry Needling, Joint mobilization, Joint manipulation, Spinal manipulation, Spinal mobilization, Scar mobilization, Vestibular training, Cryotherapy, and Moist heat  PLAN FOR NEXT SESSION: continue to focus on regaining full extension,  continue functional  strengthening & Rt knee flexion mobility  Claude Manges, PT 06/20/23 4:03 PM [facility] [address] [place], [place] [address] Phone # [phone] Fax [phone]

## 2023-06-25 ENCOUNTER — Ambulatory Visit: Admitting: Physical Therapy

## 2023-06-25 DIAGNOSIS — M6281 Muscle weakness (generalized): Secondary | ICD-10-CM

## 2023-06-25 DIAGNOSIS — R252 Cramp and spasm: Secondary | ICD-10-CM | POA: Diagnosis not present

## 2023-06-25 DIAGNOSIS — R600 Localized edema: Secondary | ICD-10-CM | POA: Diagnosis not present

## 2023-06-25 DIAGNOSIS — R293 Abnormal posture: Secondary | ICD-10-CM | POA: Diagnosis not present

## 2023-06-25 DIAGNOSIS — M25661 Stiffness of right knee, not elsewhere classified: Secondary | ICD-10-CM | POA: Diagnosis not present

## 2023-06-25 DIAGNOSIS — G8929 Other chronic pain: Secondary | ICD-10-CM | POA: Diagnosis not present

## 2023-06-25 DIAGNOSIS — R262 Difficulty in walking, not elsewhere classified: Secondary | ICD-10-CM | POA: Diagnosis not present

## 2023-06-25 DIAGNOSIS — M25561 Pain in right knee: Secondary | ICD-10-CM | POA: Diagnosis not present

## 2023-06-25 NOTE — Therapy (Signed)
 OUTPATIENT PHYSICAL THERAPY LOWER EXTREMITY TREATMENT   Patient Name: Katelyn Peterson MRN: [id number] DOB:February 05, 1946, 78 y.o., female Today's Date: 06/25/2023  END OF SESSION:  PT End of Session - 06/25/23 1604     Visit Number 26    Date for PT Re-Evaluation 06/28/23    Authorization Type [facility] [employer]    PT Start Time [phone]    PT Stop Time 1547    PT Time Calculation (min) 58 min    Activity Tolerance Patient tolerated treatment well    Behavior During Therapy WFL for tasks assessed/performed                    Past Medical History:  Diagnosis Date   Anemia    Arthritis    HTN (hypertension)    Hyperlipidemia    Obesity    Type II or unspecified type diabetes mellitus without mention of complication, not stated as uncontrolled    Past Surgical History:  Procedure Laterality Date   ABDOMINAL HYSTERECTOMY  1985   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   Procedure: TOTAL KNEE ARTHROPLASTY;  Surgeon: Ollen Gross, MD;  Location: [facility];  Service: Orthopedics;  Laterality: Right;   Patient Active Problem List   Diagnosis Date Noted   OA (osteoarthritis) of knee 03/18/2023   Primary osteoarthritis of right knee 03/18/2023   Preop exam for internal medicine 12/20/2022   Nuclear sclerotic cataract of right eye 12/07/2022   B12 deficiency 06/20/2022   Colon polyps 06/19/2022   Arthritis of left foot 04/11/2021   CRI (chronic renal insufficiency), stage 3 (moderate) (HCC) 12/15/2020   Wrist pain, left 12/10/2018   Knee pain, right 06/19/2017   Anemia 05/08/2016   Onychomycosis 06/22/2015   Arthritis of ankle 04/01/2015   Well adult exam 08/16/2014   Goiter 11/04/2012   Left ankle pain 08/22/2011   Grief 04/20/2011   Rhinitis 04/20/2011   ABDOMINAL DISTENSION 03/09/2010   KNEE PAIN 03/04/2008   LEG CRAMPS 09/03/2007   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) 04/11/2007   Obesity 04/11/2007   Dyslipidemia  associated with type 2 diabetes mellitus (HCC) 11/26/2006   Essential hypertension 11/26/2006    PCP: Tresa Garter, MD   REFERRING PROVIDER: Derenda Fennel, PA  REFERRING DIAG: M17.11 (ICD-10-CM) - Unilateral primary osteoarthritis, right knee  THERAPY DIAG:  Stiffness of right knee, not elsewhere classified  Chronic pain of right knee  Difficulty in walking, not elsewhere classified  Cramp and spasm  Abnormal posture  Muscle weakness (generalized)  Localized edema  Rationale for Evaluation and Treatment: Rehabilitation  ONSET DATE: 03/18/2023  SUBJECTIVE:   SUBJECTIVE STATEMENT: Patient reports she is doing good today. She is not having any pain or discomfort.   From Eval: Patient presents post Rt TKA on 1/6. She has been walking once a day for 10 minutes. She was not given any exercises. She has increased pain with functional movements (sit to stand, getting in and out of a car, etc). She does not have any pain if her Rt knee if it is extended. She wants to get back to walking normally when running errands.  PERTINENT HISTORY: HTN, Anemia; Type II diabetes   PAIN: 06/25/2023 Are you having pain? Yes: NPRS scale: 0/10 Pain location: Rt leg Pain description: achy dull Aggravating factors: getting out car; sitting down with knee flexed Relieving factors: Pain medication  PRECAUTIONS: Other: Status post Rt TKA 03/18/2023  RED FLAGS: None  WEIGHT BEARING RESTRICTIONS: No  FALLS:  Has patient fallen in last 6 months? No  LIVING ENVIRONMENT: Lives with: lives with their family Lives in: House/apartment Stairs: Yes: Internal: 1 steps; none Has following equipment at home: Otho Blitz - 2 wheeled and shower chair  OCCUPATION: Psychologist, sport and exercise at [employer]  PLOF: Independent and Independent with basic ADLs  PATIENT GOALS: To get the pain gone  NEXT MD VISIT: 04/02/2023  OBJECTIVE:  Note: Objective measures were completed at Evaluation unless otherwise  noted.   PATIENT SURVEYS:  FOTO 31; goal 37  COGNITION: Overall cognitive status: Within functional limits for tasks assessed     SENSATION: Rt knee Numbness   EDEMA:  Rt knee 3 cm above patella: 54.5cm 3 cam below patella:51cm At patella : 51.5 cm   POSTURE: rounded shoulders and forward head   LOWER EXTREMITY ROM: seated in wheelchair  Active ROM Right eval 03/14/2023  04/16/2023 Left eval  Hip flexion      Hip extension      Hip abduction      Hip adduction      Hip internal rotation      Hip external rotation      Knee flexion 56 AAROM 90 (semi-reclined) PROM 95    Knee extension -2 -15 -5   Ankle dorsiflexion      Ankle plantarflexion      Ankle inversion      Ankle eversion       (Blank rows = not tested)  LOWER EXTREMITY MMT: Deferred      FUNCTIONAL TESTS:  5 times sit to stand: 24.11 with Rt knee extended & UE support 03/26/2023 TUG: 42.59 03/27/2023  with front wheeled walker 31ft  04/18/2023 3MWT:348ft  05/14/2023 5STS: 10.09 sec with UE support TUG: 11.69 sec with straight cane : 596FT str cane  GAIT: Patient did not ambulate during treatment session due to increased pain. Patient required max assistance with car transfer                                                                                                                              TREATMENT DATE:  06/25/2023  Nustep x 10 min at level 5 Hamstring stretch at stair 2 x 30 sec Rt  Gastroc stretch with rockerboard 2 x 20 sec 6in step ups x 10 each leg holding 7# DB 2 x 10 bilateral  Hip matrix (hip abduction & flexion) 30# x 12 bilateral Leg Press 65# bilateral 2 x10 (seat 7) Seated LAQ 5# AW x 20 bilateral TKE with blue 50lb long loop x 20 5 sec holds Vasopneumatic compression Rt knee moderate pressure x 10 minutes     06/20/2023  Nustep x 10 min at level 7 Eccentric heel taps right LE off 6" with 2 in step on ground  2 x 10 Rt  6in step ups x 10 each leg Leg Press  60# bilateral 2 x10 (seat 7) TKE with  blue 50lb long loop x 20 5 sec holds Seated LAQ 5# AW x 20 bilateral  Edema assessment Prone quad stretch with strap 3 x 30 sec  Manual: roller to Rt distal hamstring & proximal calf  Vasopneumatic compression Rt knee moderate pressure x 10 minutes (right leg up on wedge to assist with edema)    06/18/2023  Nustep x 10 min at level 5 Supine quad set x 20 SLR 2 x 10 Supine hamstring stretch with green strap 2 x 30sec on Rt Supine manual hamstring stretch x 10 holding 10 sec Supine with heel propped on yoga block manual extension stretch x 10 holding 10  Gastroc stretch on rocker board 3 x 30sec Hip matrix (hip abduction & flexion) 25# x 10 bilateral  Seated LAQ 5# AW x 20 bilateral  High marching down PT hallway with 5# AW x 1 lap TKE with blue 50lb long loop x 20 5 sec holds Vasopneumatic compression Rt knee moderate pressure x 10 minutes (right leg up on wedge to assist with edema)     PATIENT EDUCATION:  Education details: HEP; POC Person educated: Patient Education method: Explanation, Demonstration, and Handouts Education comprehension: verbalized understanding, returned demonstration, and needs further education  HOME EXERCISE PROGRAM: Access Code: [email or web address] URL: [email or web address] Date: 04/09/2023 Prepared by: Claude Manges  Exercises - Seated Heel Slide  - 1 x daily - 7 x weekly - 2 sets - 10 reps - Sitting Heel Slide with Towel  - 1 x daily - 7 x weekly - 2 sets - 10 reps - Supine Quad Set  - 1 x daily - 7 x weekly - 2 sets - 10 reps - 2-3 hold - Active Straight Leg Raise with Quad Set  - 1 x daily - 7 x weekly - 2 sets - 10 reps - Seated Hamstring Stretch  - 1 x daily - 7 x weekly - 2 sets - 10 reps - Standing March with Counter Support  - 1 x daily - 7 x weekly - 1 sets - 10 reps - Standing Hip Abduction with Counter Support  - 1 x daily - 7 x weekly - 1 sets - 10 reps - Supine Knee Extension Stretch on Towel  Roll  - 1 x daily - 7 x weekly - 2 sets - 10 reps - Seated Knee Extension Stretch with Chair  - 1 x daily - 7 x weekly - 2 sets - 30 hold  ASSESSMENT:  CLINICAL IMPRESSION: Claris Che continues to progress well with physical therapy. She has been experiencing minimal pain with functional activities. She is still lacking a few degrees of extension and she walks with a slightly flexed Rt leg. Overall, she has progressed well post total knee replacement. Patient will benefit from skilled PT to address the below impairments and improve overall function.    .  OBJECTIVE IMPAIRMENTS: Abnormal gait, decreased mobility, difficulty walking, decreased ROM, decreased strength, increased edema, increased muscle spasms, impaired flexibility, postural dysfunction, obesity, and pain.   ACTIVITY LIMITATIONS: lifting, standing, squatting, stairs, transfers, and bed mobility  PARTICIPATION LIMITATIONS: cleaning, laundry, driving, shopping, community activity, and occupation  PERSONAL FACTORS: Fitness and 1-2 comorbidities: HTN, Anemia; Type II diabetes   are also affecting patient's functional outcome.   REHAB POTENTIAL: Good  CLINICAL DECISION MAKING: Stable/uncomplicated  EVALUATION COMPLEXITY: Low   GOALS: Goals reviewed with patient? Yes  SHORT TERM GOALS: Target date: 05/02/2023  Patient will be independent with initial HEP. Baseline:  Goal status: MET 04/18/2023  2.  Patient will be able to complete a car transfer with < or = to 2/10 knee pain. Baseline:  Goal status: MET 05/16/23  3.  Patient will demonstrate > or = to 90 degrees of knee flexion for improved sit to stand transfer. Baseline:  Goal status: MET 04/18/2023    LONG TERM GOALS: Target date: 06/28/2023 Patient will demonstrate independence in advanced HEP. Baseline:  Goal status: In progress  2.  Patient will be able to ascend a curb/ step for safe community negotiation. Baseline:  Goal status: MET 05/07/23  3.  Patient will  be able to return to work and complete all work duties. Baseline:  Goal status: MET 06/11/23  4.  Patient will score < or = to 19 sec on 5 STS for improved functional mobility. Baseline:  Goal status: MET 05/14/2023  5.  Patient will score > or = to 61 on FOTO for improved function.  Baseline:  Goal status: Discontinued due to FOTO system being not available  6.  Patient will be able to walk in the community and run errands with < or = to 2/10 knee pain. Baseline:  Goal status: MET 06/13/23   PLAN:  PT FREQUENCY: 2x/week  PT DURATION: 12 weeks  PLANNED INTERVENTIONS: 97164- PT Re-evaluation, 97110-Therapeutic exercises, 97530- Therapeutic activity, 97112- Neuromuscular re-education, 97535- Self Care, 08657- Manual therapy, [phone]- Gait training, [phone]- Aquatic Therapy, 97014- Electrical stimulation (unattended), [phone]- Electrical stimulation (manual), 97016- Vasopneumatic device, [id number]- Ultrasound, [id number]- Traction (mechanical), [id number]- Ionotophoresis 4mg /ml Dexamethasone, Patient/Family education, Balance training, Stair training, Taping, Dry Needling, Joint mobilization, Joint manipulation, Spinal manipulation, Spinal mobilization, Scar mobilization, Vestibular training, Cryotherapy, and Moist heat  PLAN FOR NEXT SESSION: possible d/c next session; measure Rt knee ROM  Penelope Bowie, PT 06/25/23 4:04 PM [facility] [address] [place], [place] [address] Phone # [phone] Fax [phone]

## 2023-06-27 ENCOUNTER — Other Ambulatory Visit: Payer: Self-pay

## 2023-06-27 ENCOUNTER — Other Ambulatory Visit: Payer: Self-pay | Admitting: Internal Medicine

## 2023-06-27 ENCOUNTER — Ambulatory Visit: Admitting: Physical Therapy

## 2023-06-27 ENCOUNTER — Other Ambulatory Visit (HOSPITAL_COMMUNITY): Payer: Self-pay

## 2023-06-27 ENCOUNTER — Encounter: Payer: Self-pay | Admitting: Physical Therapy

## 2023-06-27 DIAGNOSIS — R262 Difficulty in walking, not elsewhere classified: Secondary | ICD-10-CM

## 2023-06-27 DIAGNOSIS — R293 Abnormal posture: Secondary | ICD-10-CM | POA: Diagnosis not present

## 2023-06-27 DIAGNOSIS — R600 Localized edema: Secondary | ICD-10-CM

## 2023-06-27 DIAGNOSIS — G8929 Other chronic pain: Secondary | ICD-10-CM | POA: Diagnosis not present

## 2023-06-27 DIAGNOSIS — M6281 Muscle weakness (generalized): Secondary | ICD-10-CM | POA: Diagnosis not present

## 2023-06-27 DIAGNOSIS — M25661 Stiffness of right knee, not elsewhere classified: Secondary | ICD-10-CM | POA: Diagnosis not present

## 2023-06-27 DIAGNOSIS — R252 Cramp and spasm: Secondary | ICD-10-CM

## 2023-06-27 DIAGNOSIS — M25561 Pain in right knee: Secondary | ICD-10-CM | POA: Diagnosis not present

## 2023-06-27 MED ORDER — ATORVASTATIN CALCIUM 20 MG PO TABS
20.0000 mg | ORAL_TABLET | Freq: Every day | ORAL | 3 refills | Status: AC
  Filled 2023-06-27: qty 90, 90d supply, fill #0

## 2023-06-27 NOTE — Therapy (Signed)
 OUTPATIENT PHYSICAL THERAPY LOWER EXTREMITY TREATMENT / DISCHARGE NOTE   Patient Name: Katelyn Peterson MRN: [id number] DOB:1946-01-18, 78 y.o., female Today's Date: 06/27/2023  END OF SESSION:  PT End of Session - 06/27/23 1613     Visit Number 27    Date for PT Re-Evaluation 06/28/23    Authorization Type [employer]    PT Start Time 1450    PT Stop Time 1544    PT Time Calculation (min) 54 min    Activity Tolerance Patient tolerated treatment well    Behavior During Therapy WFL for tasks assessed/performed                     Past Medical History:  Diagnosis Date   Anemia    Arthritis    HTN (hypertension)    Hyperlipidemia    Obesity    Type II or unspecified type diabetes mellitus without mention of complication, not stated as uncontrolled    Past Surgical History:  Procedure Laterality Date   ABDOMINAL HYSTERECTOMY  1985   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   Procedure: TOTAL KNEE ARTHROPLASTY;  Surgeon: Liliane Rei, MD;  Location: [facility];  Service: Orthopedics;  Laterality: Right;   Patient Active Problem List   Diagnosis Date Noted   OA (osteoarthritis) of knee 03/18/2023   Primary osteoarthritis of right knee 03/18/2023   Preop exam for internal medicine 12/20/2022   Nuclear sclerotic cataract of right eye 12/07/2022   B12 deficiency 06/20/2022   Colon polyps 06/19/2022   Arthritis of left foot 04/11/2021   CRI (chronic renal insufficiency), stage 3 (moderate) (HCC) 12/15/2020   Wrist pain, left 12/10/2018   Knee pain, right 06/19/2017   Anemia 05/08/2016   Onychomycosis 06/22/2015   Arthritis of ankle 04/01/2015   Well adult exam 08/16/2014   Goiter 11/04/2012   Left ankle pain 08/22/2011   Grief 04/20/2011   Rhinitis 04/20/2011   ABDOMINAL DISTENSION 03/09/2010   KNEE PAIN 03/04/2008   LEG CRAMPS 09/03/2007   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) 04/11/2007   Obesity  04/11/2007   Dyslipidemia associated with type 2 diabetes mellitus (HCC) 11/26/2006   Essential hypertension 11/26/2006    PCP: Genia Kettering, MD   REFERRING PROVIDER: Dolphus Friday, PA  REFERRING DIAG: M17.11 (ICD-10-CM) - Unilateral primary osteoarthritis, right knee  THERAPY DIAG:  Stiffness of right knee, not elsewhere classified  Chronic pain of right knee  Difficulty in walking, not elsewhere classified  Cramp and spasm  Abnormal posture  Muscle weakness (generalized)  Localized edema  Rationale for Evaluation and Treatment: Rehabilitation  ONSET DATE: 03/18/2023  SUBJECTIVE:   SUBJECTIVE STATEMENT: Patient reports she is doing good today. She is not having any pain or discomfort.   From Eval: Patient presents post Rt TKA on 1/6. She has been walking once a day for 10 minutes. She was not given any exercises. She has increased pain with functional movements (sit to stand, getting in and out of a car, etc). She does not have any pain if her Rt knee if it is extended. She wants to get back to walking normally when running errands.  PERTINENT HISTORY: HTN, Anemia; Type II diabetes   PAIN: 06/27/2023 Are you having pain? Yes: NPRS scale: 0/10 Pain location: Rt leg Pain description: achy dull Aggravating factors: getting out car; sitting down with knee flexed Relieving factors: Pain medication  PRECAUTIONS: Other: Status post Rt TKA 03/18/2023  RED  FLAGS: None   WEIGHT BEARING RESTRICTIONS: No  FALLS:  Has patient fallen in last 6 months? No  LIVING ENVIRONMENT: Lives with: lives with their family Lives in: House/apartment Stairs: Yes: Internal: 1 steps; none Has following equipment at home: Dan Humphreys - 2 wheeled and shower chair  OCCUPATION: Psychologist, sport and exercise at [employer]  PLOF: Independent and Independent with basic ADLs  PATIENT GOALS: To get the pain gone  NEXT MD VISIT: 04/02/2023  OBJECTIVE:  Note: Objective measures were completed at Evaluation  unless otherwise noted.   PATIENT SURVEYS:  FOTO 31; goal 76  COGNITION: Overall cognitive status: Within functional limits for tasks assessed     SENSATION: Rt knee Numbness   EDEMA:  Rt knee 3 cm above patella: 54.5cm 3 cam below patella:51cm At patella : 51.5 cm   POSTURE: rounded shoulders and forward head   LOWER EXTREMITY ROM: seated in wheelchair  Active ROM Right eval 03/14/2023  04/16/2023 Left eval  Hip flexion      Hip extension      Hip abduction      Hip adduction      Hip internal rotation      Hip external rotation      Knee flexion 56 AAROM 90 (semi-reclined) PROM 95    Knee extension -2 -15 -5   Ankle dorsiflexion      Ankle plantarflexion      Ankle inversion      Ankle eversion       (Blank rows = not tested)  LOWER EXTREMITY MMT: Deferred      FUNCTIONAL TESTS:  5 times sit to stand: 24.11 with Rt knee extended & UE support 03/26/2023 TUG: 42.59 03/27/2023  with front wheeled walker 38ft  04/18/2023 3MWT:329ft  05/14/2023 5STS: 10.09 sec with UE support TUG: 11.69 sec with straight cane : 596FT str cane  GAIT: Patient did not ambulate during treatment session due to increased pain. Patient required max assistance with car transfer                                                                                                                              TREATMENT DATE:  06/27/2023  Recumbent Bike 6 mins- PT present to discuss status Standing Gastroc stretch at wall 2 x 30 sec (added to HEP) Seated knee flexion with slider on Rt x 20 Sit to stand 7# DB x 10 Leg Press 70# bilateral 2 x10 (seat 7) Seated LAQ 5# AW x 20 bilateral Long sitting passive extension stretch with yoga block Long sitting SLR 2 x 10 on Rt  Vasopneumatic compression Rt knee moderate pressure x 10 minutes    06/25/2023  Nustep x 10 min at level 5 Hamstring stretch at stair 2 x 30 sec Rt  Gastroc stretch with rockerboard 2 x 20 sec 6in step ups x 10  each leg holding 7# DB 2 x 10 bilateral  Hip matrix (hip abduction & flexion) 30#  x 12 bilateral Leg Press 65# bilateral 2 x10 (seat 7) Seated LAQ 5# AW x 20 bilateral TKE with blue 50lb long loop x 20 5 sec holds Vasopneumatic compression Rt knee moderate pressure x 10 minutes     06/20/2023  Nustep x 10 min at level 7 Eccentric heel taps right LE off 6" with 2 in step on ground  2 x 10 Rt  6in step ups x 10 each leg Leg Press 60# bilateral 2 x10 (seat 7) TKE with blue 50lb long loop x 20 5 sec holds Seated LAQ 5# AW x 20 bilateral  Edema assessment Prone quad stretch with strap 3 x 30 sec  Manual: roller to Rt distal hamstring & proximal calf  Vasopneumatic compression Rt knee moderate pressure x 10 minutes (right leg up on wedge to assist with edema)    06/18/2023  Nustep x 10 min at level 5 Supine quad set x 20 SLR 2 x 10 Supine hamstring stretch with green strap 2 x 30sec on Rt Supine manual hamstring stretch x 10 holding 10 sec Supine with heel propped on yoga block manual extension stretch x 10 holding 10  Gastroc stretch on rocker board 3 x 30sec Hip matrix (hip abduction & flexion) 25# x 10 bilateral  Seated LAQ 5# AW x 20 bilateral  High marching down PT hallway with 5# AW x 1 lap TKE with blue 50lb long loop x 20 5 sec holds Vasopneumatic compression Rt knee moderate pressure x 10 minutes (right leg up on wedge to assist with edema)     PATIENT EDUCATION:  Education details: HEP; POC Person educated: Patient Education method: Explanation, Demonstration, and Handouts Education comprehension: verbalized understanding, returned demonstration, and needs further education  HOME EXERCISE PROGRAM: Access Code: [email or web address] URL: [email or web address] Date: 06/27/2023 Prepared by: Claude Manges  Exercises - Seated Heel Slide  - 1 x daily - 7 x weekly - 2 sets - 10 reps - Sitting Heel Slide with Towel  - 1 x daily - 7 x weekly - 2 sets - 10 reps - Supine  Quad Set  - 1 x daily - 7 x weekly - 2 sets - 10 reps - 2-3 hold - Active Straight Leg Raise with Quad Set  - 1 x daily - 7 x weekly - 2 sets - 10 reps - Seated Hamstring Stretch  - 1 x daily - 7 x weekly - 2 sets - 10 reps - Standing March with Counter Support  - 1 x daily - 7 x weekly - 1 sets - 10 reps - Standing Hip Abduction with Counter Support  - 1 x daily - 7 x weekly - 1 sets - 10 reps - Supine Knee Extension Stretch on Towel Roll  - 1 x daily - 7 x weekly - 2 sets - 10 reps - Seated Knee Extension Stretch with Chair  - 1 x daily - 7 x weekly - 2 sets - 30 hold - Gastroc Stretch on Wall  - 1 x daily - 7 x weekly - 3 sets - 20-30 hold - Step Up  - 1 x daily - 7 x weekly - 2 sets - 10 reps  ASSESSMENT:  CLINICAL IMPRESSION: Claris Che has made great improvements with physical therapy. She has met all of her goals at this time and she is able to do all of her functional activities without pain or discomfort. She still has some stiffness and swelling in her right leg, but otherwise she has no complaints.  Educated patient on ways she can incorporate movement during her work day since she has a sedentary job. Patient verbalized she would incorporate these changes. Patient to discharge home with HEP    .  OBJECTIVE IMPAIRMENTS: Abnormal gait, decreased mobility, difficulty walking, decreased ROM, decreased strength, increased edema, increased muscle spasms, impaired flexibility, postural dysfunction, obesity, and pain.   ACTIVITY LIMITATIONS: lifting, standing, squatting, stairs, transfers, and bed mobility  PARTICIPATION LIMITATIONS: cleaning, laundry, driving, shopping, community activity, and occupation  PERSONAL FACTORS: Fitness and 1-2 comorbidities: HTN, Anemia; Type II diabetes   are also affecting patient's functional outcome.   REHAB POTENTIAL: Good  CLINICAL DECISION MAKING: Stable/uncomplicated  EVALUATION COMPLEXITY: Low   GOALS: Goals reviewed with patient? Yes  SHORT  TERM GOALS: Target date: 05/02/2023  Patient will be independent with initial HEP. Baseline:  Goal status: MET 04/18/2023  2.  Patient will be able to complete a car transfer with < or = to 2/10 knee pain. Baseline:  Goal status: MET 05/16/23  3.  Patient will demonstrate > or = to 90 degrees of knee flexion for improved sit to stand transfer. Baseline:  Goal status: MET 04/18/2023    LONG TERM GOALS: Target date: 06/28/2023 Patient will demonstrate independence in advanced HEP. Baseline:  Goal status: In progress  2.  Patient will be able to ascend a curb/ step for safe community negotiation. Baseline:  Goal status: MET 05/07/23  3.  Patient will be able to return to work and complete all work duties. Baseline:  Goal status: MET 06/11/23  4.  Patient will score < or = to 19 sec on 5 STS for improved functional mobility. Baseline:  Goal status: MET 05/14/2023  5.  Patient will score > or = to 61 on FOTO for improved function.  Baseline:  Goal status: Discontinued due to FOTO system being not available  6.  Patient will be able to walk in the community and run errands with < or = to 2/10 knee pain. Baseline:  Goal status: MET 06/13/23   PLAN:  PT FREQUENCY: 2x/week  PT DURATION: 12 weeks  PLANNED INTERVENTIONS: 97164- PT Re-evaluation, 97110-Therapeutic exercises, 97530- Therapeutic activity, 97112- Neuromuscular re-education, 97535- Self Care, 82956- Manual therapy, [phone]- Gait training, [phone]- Aquatic Therapy, 97014- Electrical stimulation (unattended), [phone]- Electrical stimulation (manual), 97016- Vasopneumatic device, [id number]- Ultrasound, [id number]- Traction (mechanical), [id number]- Ionotophoresis 4mg /ml Dexamethasone, Patient/Family education, Balance training, Stair training, Taping, Dry Needling, Joint mobilization, Joint manipulation, Spinal manipulation, Spinal mobilization, Scar mobilization, Vestibular training, Cryotherapy, and Moist heat  PLAN FOR NEXT SESSION: Patient to  discharge home with HEP  Penelope Bowie, PT 06/27/23 4:15 PM [facility] [address], [address] [place], [place] [address] Phone # [phone] Fax [phone]   PHYSICAL THERAPY DISCHARGE SUMMARY  Visits from Start of Care: 27  Current functional level related to goals / functional outcomes: See above   Remaining deficits: Some residual swelling and occasional stiffness   Education / Equipment: See above   Patient agrees to discharge. Patient goals were met. Patient is being discharged due to meeting the stated rehab goals.

## 2023-06-28 ENCOUNTER — Other Ambulatory Visit (HOSPITAL_COMMUNITY): Payer: Self-pay

## 2023-06-29 ENCOUNTER — Other Ambulatory Visit (HOSPITAL_COMMUNITY): Payer: Self-pay

## 2023-07-05 DIAGNOSIS — H25812 Combined forms of age-related cataract, left eye: Secondary | ICD-10-CM | POA: Diagnosis not present

## 2023-07-11 DIAGNOSIS — H25811 Combined forms of age-related cataract, right eye: Secondary | ICD-10-CM | POA: Diagnosis not present

## 2023-07-11 DIAGNOSIS — H2512 Age-related nuclear cataract, left eye: Secondary | ICD-10-CM | POA: Diagnosis not present

## 2023-07-11 DIAGNOSIS — H25812 Combined forms of age-related cataract, left eye: Secondary | ICD-10-CM | POA: Diagnosis not present

## 2023-07-16 ENCOUNTER — Other Ambulatory Visit (HOSPITAL_BASED_OUTPATIENT_CLINIC_OR_DEPARTMENT_OTHER): Payer: Self-pay

## 2023-07-16 ENCOUNTER — Other Ambulatory Visit (HOSPITAL_COMMUNITY): Payer: Self-pay

## 2023-07-17 ENCOUNTER — Other Ambulatory Visit (HOSPITAL_BASED_OUTPATIENT_CLINIC_OR_DEPARTMENT_OTHER): Payer: Self-pay

## 2023-07-18 ENCOUNTER — Other Ambulatory Visit (HOSPITAL_BASED_OUTPATIENT_CLINIC_OR_DEPARTMENT_OTHER): Payer: Self-pay

## 2023-07-24 ENCOUNTER — Ambulatory Visit: Admitting: Internal Medicine

## 2023-07-26 ENCOUNTER — Encounter: Payer: Self-pay | Admitting: Internal Medicine

## 2023-07-26 ENCOUNTER — Ambulatory Visit (INDEPENDENT_AMBULATORY_CARE_PROVIDER_SITE_OTHER): Admitting: Internal Medicine

## 2023-07-26 VITALS — BP 120/72 | HR 73 | Temp 97.7°F | Ht 64.5 in | Wt 203.4 lb

## 2023-07-26 DIAGNOSIS — Z6835 Body mass index (BMI) 35.0-35.9, adult: Secondary | ICD-10-CM

## 2023-07-26 DIAGNOSIS — E113592 Type 2 diabetes mellitus with proliferative diabetic retinopathy without macular edema, left eye: Secondary | ICD-10-CM | POA: Diagnosis not present

## 2023-07-26 DIAGNOSIS — M25561 Pain in right knee: Secondary | ICD-10-CM

## 2023-07-26 DIAGNOSIS — E1169 Type 2 diabetes mellitus with other specified complication: Secondary | ICD-10-CM | POA: Diagnosis not present

## 2023-07-26 DIAGNOSIS — E66812 Obesity, class 2: Secondary | ICD-10-CM | POA: Diagnosis not present

## 2023-07-26 DIAGNOSIS — E785 Hyperlipidemia, unspecified: Secondary | ICD-10-CM

## 2023-07-26 DIAGNOSIS — E538 Deficiency of other specified B group vitamins: Secondary | ICD-10-CM | POA: Diagnosis not present

## 2023-07-26 DIAGNOSIS — N2889 Other specified disorders of kidney and ureter: Secondary | ICD-10-CM

## 2023-07-26 DIAGNOSIS — N183 Chronic kidney disease, stage 3 unspecified: Secondary | ICD-10-CM | POA: Diagnosis not present

## 2023-07-26 DIAGNOSIS — D638 Anemia in other chronic diseases classified elsewhere: Secondary | ICD-10-CM

## 2023-07-26 DIAGNOSIS — Z7984 Long term (current) use of oral hypoglycemic drugs: Secondary | ICD-10-CM

## 2023-07-26 LAB — COMPREHENSIVE METABOLIC PANEL WITH GFR
ALT: 15 U/L (ref 0–35)
AST: 13 U/L (ref 0–37)
Albumin: 4 g/dL (ref 3.5–5.2)
Alkaline Phosphatase: 107 U/L (ref 39–117)
BUN: 25 mg/dL — ABNORMAL HIGH (ref 6–23)
CO2: 27 meq/L (ref 19–32)
Calcium: 9.5 mg/dL (ref 8.4–10.5)
Chloride: 107 meq/L (ref 96–112)
Creatinine, Ser: 1.19 mg/dL (ref 0.40–1.20)
GFR: 44.15 mL/min — ABNORMAL LOW (ref >60.00)
Glucose, Bld: 102 mg/dL — ABNORMAL HIGH (ref 70–99)
Potassium: 4.6 meq/L (ref 3.5–5.1)
Sodium: 141 meq/L (ref 135–145)
Total Bilirubin: 0.4 mg/dL (ref 0.2–1.2)
Total Protein: 7.5 g/dL (ref 6.0–8.3)

## 2023-07-26 LAB — CBC WITH DIFFERENTIAL/PLATELET
Basophils Absolute: 0 10*3/uL (ref 0.0–0.1)
Basophils Relative: 0.5 % (ref 0.0–3.0)
Eosinophils Absolute: 0.1 10*3/uL (ref 0.0–0.7)
Eosinophils Relative: 1.3 % (ref 0.0–5.0)
HCT: 31 % — ABNORMAL LOW (ref 36.0–46.0)
Hemoglobin: 10 g/dL — ABNORMAL LOW (ref 12.0–15.0)
Lymphocytes Relative: 32.4 % (ref 12.0–46.0)
Lymphs Abs: 1.5 10*3/uL (ref 0.7–4.0)
MCHC: 32.3 g/dL (ref 30.0–36.0)
MCV: 84.5 fl (ref 78.0–100.0)
Monocytes Absolute: 0.3 10*3/uL (ref 0.1–1.0)
Monocytes Relative: 7.1 % (ref 3.0–12.0)
Neutro Abs: 2.7 10*3/uL (ref 1.4–7.7)
Neutrophils Relative %: 58.7 % (ref 43.0–77.0)
Platelets: 271 10*3/uL (ref 150.0–400.0)
RBC: 3.66 Mil/uL — ABNORMAL LOW (ref 3.87–5.11)
RDW: 15 % (ref 11.5–15.5)
WBC: 4.7 10*3/uL (ref 4.0–10.5)

## 2023-07-26 NOTE — Assessment & Plan Note (Signed)
Recheck wt.

## 2023-07-26 NOTE — Progress Notes (Signed)
 Subjective:  Patient ID: Katelyn Peterson, female    DOB: 1945/03/21  Age: 78 y.o. MRN: [id number]  CC: Medical Management of Chronic Issues (6 Month follow up)   HPI [name] presents for DM, HTN, OA  Outpatient Medications Prior to Visit  Medication Sig Dispense Refill   ascorbic acid (VITAMIN C) 500 MG tablet Take 500 mg by mouth daily.     atorvastatin  (LIPITOR) 20 MG tablet Take 1 tablet (20 mg total) by mouth daily. 90 tablet 3   Cholecalciferol (VITAMIN D3) 250 MCG (10000 UT) capsule Take 10,000 Units by mouth daily.     Cyanocobalamin  (B-12 COMPLIANCE INJECTION IJ) Inject 1 Dose as directed every 14 (fourteen) days.     empagliflozin  (JARDIANCE ) 10 MG TABS tablet Take 1 tablet (10 mg total) by mouth daily before breakfast. 90 tablet 3   Iron , Ferrous Sulfate , 325 (65 Fe) MG TABS Take 1 tablet by mouth daily. 30 tablet 3   lisinopril  (ZESTRIL ) 40 MG tablet Take 1 tablet (40 mg total) by mouth daily. 90 tablet 3   methocarbamol  (ROBAXIN ) 500 MG tablet Take 1 tablet (500 mg total) by mouth every 6 (six) hours as needed for muscle spasms. 40 tablet 0   omeprazole  (PRILOSEC) 20 MG capsule Take 1 capsule (20 mg total) by mouth daily. 90 capsule 3   ondansetron  (ZOFRAN ) 4 MG tablet Take 1 tablet (4 mg total) by mouth every 6 (six) hours as needed for nausea. 20 tablet 0   prednisoLONE  acetate (PRED FORTE ) 1 % ophthalmic suspension Instill 1 drop in the Right eye 4 times a day. Start the drops after you arrive home from surgery. (Patient taking differently: Place 1 drop into the right eye 4 (four) times daily. Will start 05/30/23) 5 mL 2   Semaglutide ,0.25 or 0.5MG /DOS, (OZEMPIC , 0.25 OR 0.5 MG/DOSE,) 2 MG/3ML SOPN Inject 0.5 mg into the skin once a week. 9 mL 3   spironolactone  (ALDACTONE ) 50 MG tablet Take 1 tablet (50 mg total) by mouth daily. 90 tablet 3   traMADol  (ULTRAM ) 50 MG tablet Take 1 tablet (50 mg total) by mouth every 6 (six) hours as needed. 100 tablet 2    verapamil  (VERELAN ) 240 MG 24 hr capsule Take 1 capsule (240 mg total) by mouth at bedtime. 90 capsule 3   omeprazole  (PRILOSEC) 20 MG capsule Take 1 capsule (20 mg total) by mouth 2 (two) times daily for 14 days, THEN 1 capsule (20 mg total) daily for 14 days, THEN 1 capsule (20 mg total) daily. (Patient taking differently: Take 20 mg by mouth once daily) 72 capsule 0   oxyCODONE  (OXY IR/ROXICODONE ) 5 MG immediate release tablet Take 1-2 tablets (5-10 mg total) by mouth every 6 (six) hours as needed for severe pain (pain score 7-10). (Patient not taking: Reported on 07/26/2023) 42 tablet 0   No facility-administered medications prior to visit.    ROS: Review of Systems  Constitutional:  Negative for activity change, appetite change, chills, fatigue and unexpected weight change.  HENT:  Negative for congestion, mouth sores and sinus pressure.   Eyes:  Negative for visual disturbance.  Respiratory:  Negative for cough and chest tightness.   Gastrointestinal:  Negative for abdominal pain and nausea.  Genitourinary:  Negative for difficulty urinating, frequency and vaginal pain.  Musculoskeletal:  Negative for back pain and gait problem.  Skin:  Negative for pallor and rash.  Neurological:  Negative for dizziness, tremors, weakness, numbness and headaches.  Psychiatric/Behavioral:  Negative for confusion and sleep disturbance.     Objective:  BP 120/72   Pulse 73   Temp 97.7 F (36.5 C)   Ht 5' 4.5" (1.638 m)   Wt 203 lb 6.4 oz (92.3 kg)   SpO2 99%   BMI 34.37 kg/m   BP Readings from Last 3 Encounters:  07/26/23 120/72  05/14/23 120/70  03/19/23 129/74    Wt Readings from Last 3 Encounters:  07/26/23 203 lb 6.4 oz (92.3 kg)  05/14/23 188 lb 6.4 oz (85.5 kg)  03/12/23 194 lb (88 kg)    Physical Exam Constitutional:      General: She is not in acute distress.    Appearance: She is well-developed. She is obese.  HENT:     Head: Normocephalic.     Right Ear: External ear  normal.     Left Ear: External ear normal.     Nose: Nose normal.  Eyes:     General:        Right eye: No discharge.        Left eye: No discharge.     Conjunctiva/sclera: Conjunctivae normal.     Pupils: Pupils are equal, round, and reactive to light.  Neck:     Thyroid : No thyromegaly.     Vascular: No JVD.     Trachea: No tracheal deviation.  Cardiovascular:     Rate and Rhythm: Normal rate and regular rhythm.     Heart sounds: Normal heart sounds.  Pulmonary:     Effort: No respiratory distress.     Breath sounds: No stridor. No wheezing.  Abdominal:     General: Bowel sounds are normal. There is no distension.     Palpations: Abdomen is soft. There is no mass.     Tenderness: There is no abdominal tenderness. There is no guarding or rebound.  Musculoskeletal:        General: No tenderness.     Cervical back: Normal range of motion and neck supple. No rigidity.  Lymphadenopathy:     Cervical: No cervical adenopathy.  Skin:    Findings: No erythema or rash.  Neurological:     Cranial Nerves: No cranial nerve deficit.     Motor: No abnormal muscle tone.     Coordination: Coordination normal.     Deep Tendon Reflexes: Reflexes normal.  Psychiatric:        Behavior: Behavior normal.        Thought Content: Thought content normal.        Judgment: Judgment normal.     Lab Results  Component Value Date   WBC 9.6 03/19/2023   HGB 9.1 (L) 03/19/2023   HCT 27.9 (L) 03/19/2023   PLT 209 03/19/2023   GLUCOSE 176 (H) 03/19/2023   CHOL 155 12/18/2021   TRIG 62.0 12/18/2021   HDL 56.80 12/18/2021   LDLDIRECT 146.5 09/18/2012   LDLCALC 86 12/18/2021   ALT 12 10/05/2022   AST 11 10/05/2022   NA 137 03/19/2023   K 4.9 03/19/2023   CL 109 03/19/2023   CREATININE 1.16 (H) 03/19/2023   BUN 25 (H) 03/19/2023   CO2 21 (L) 03/19/2023   TSH 1.75 12/18/2021   HGBA1C 6.1 (A) 05/14/2023   MICROALBUR <0.7 11/13/2022    No results found.  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) (Chronic)   Labs w/Dr Aldona Amel  Dyslipidemia associated with type 2 diabetes mellitus (HCC) - Primary   Chronic Cont on Lipitor      Obesity   Re-check wt      KNEE PAIN   S/p R TKR       Anemia, chronic disease   Check CBC      Relevant Orders   CBC with Differential/Platelet   Iron , TIBC and Ferritin Panel   CRI (chronic renal insufficiency), stage 3 (moderate) (HCC)    Hydrate better Nephrology ref - pt declined On Jardiance , [facility]        Relevant Orders   Comprehensive metabolic panel with GFR   B12 deficiency     Check labs On  B12      Relevant Orders   Vitamin B12      No orders of the defined types were placed in this encounter.     Follow-up: Return in about 4 months (around 11/26/2023) for a follow-up visit.  Anitra Barn, MD

## 2023-07-26 NOTE — Assessment & Plan Note (Signed)
 Check CBC

## 2023-07-26 NOTE — Assessment & Plan Note (Signed)
S/p R TKR 

## 2023-07-26 NOTE — Assessment & Plan Note (Signed)
  Check labs On  B12

## 2023-07-26 NOTE — Assessment & Plan Note (Signed)
Labs w/Dr Elvera Lennox

## 2023-07-26 NOTE — Assessment & Plan Note (Signed)
Chronic. Cont on Lipitor 

## 2023-07-26 NOTE — Assessment & Plan Note (Addendum)
  Hydrate better Nephrology ref - pt declined On Jardiance , [facility]

## 2023-07-27 LAB — IRON,TIBC AND FERRITIN PANEL
%SAT: 26 % (ref 16–45)
Ferritin: 10 ng/mL — ABNORMAL LOW (ref 16–288)
Iron: 120 ug/dL (ref 45–160)
TIBC: 466 ug/dL — ABNORMAL HIGH (ref 250–450)

## 2023-07-29 ENCOUNTER — Ambulatory Visit: Payer: Self-pay | Admitting: Internal Medicine

## 2023-07-30 LAB — VITAMIN B12: Vitamin B-12: >1500 pg/mL — ABNORMAL HIGH (ref 211–911)

## 2023-08-13 DIAGNOSIS — H52203 Unspecified astigmatism, bilateral: Secondary | ICD-10-CM | POA: Diagnosis not present

## 2023-08-13 DIAGNOSIS — H524 Presbyopia: Secondary | ICD-10-CM | POA: Diagnosis not present

## 2023-08-23 DIAGNOSIS — H33311 Horseshoe tear of retina without detachment, right eye: Secondary | ICD-10-CM | POA: Diagnosis not present

## 2023-08-23 DIAGNOSIS — H3561 Retinal hemorrhage, right eye: Secondary | ICD-10-CM | POA: Diagnosis not present

## 2023-08-23 DIAGNOSIS — H35043 Retinal micro-aneurysms, unspecified, bilateral: Secondary | ICD-10-CM | POA: Diagnosis not present

## 2023-08-31 ENCOUNTER — Other Ambulatory Visit (HOSPITAL_COMMUNITY): Payer: Self-pay

## 2023-09-09 DIAGNOSIS — H33311 Horseshoe tear of retina without detachment, right eye: Secondary | ICD-10-CM | POA: Diagnosis not present

## 2023-10-14 ENCOUNTER — Other Ambulatory Visit (HOSPITAL_COMMUNITY): Payer: Self-pay

## 2023-11-01 ENCOUNTER — Other Ambulatory Visit (HOSPITAL_COMMUNITY): Payer: Self-pay

## 2023-11-07 ENCOUNTER — Ambulatory Visit (INDEPENDENT_AMBULATORY_CARE_PROVIDER_SITE_OTHER): Admitting: Internal Medicine

## 2023-11-07 ENCOUNTER — Encounter: Payer: Self-pay | Admitting: Internal Medicine

## 2023-11-07 ENCOUNTER — Encounter: Admitting: Internal Medicine

## 2023-11-07 ENCOUNTER — Other Ambulatory Visit (HOSPITAL_COMMUNITY): Payer: Self-pay

## 2023-11-07 VITALS — BP 120/74 | HR 80 | Temp 97.8°F | Ht 64.5 in | Wt 208.0 lb

## 2023-11-07 DIAGNOSIS — Z0000 Encounter for general adult medical examination without abnormal findings: Secondary | ICD-10-CM

## 2023-11-07 DIAGNOSIS — E538 Deficiency of other specified B group vitamins: Secondary | ICD-10-CM

## 2023-11-07 DIAGNOSIS — E113592 Type 2 diabetes mellitus with proliferative diabetic retinopathy without macular edema, left eye: Secondary | ICD-10-CM | POA: Diagnosis not present

## 2023-11-07 DIAGNOSIS — Z7984 Long term (current) use of oral hypoglycemic drugs: Secondary | ICD-10-CM

## 2023-11-07 MED ORDER — SEMAGLUTIDE (1 MG/DOSE) 4 MG/3ML ~~LOC~~ SOPN
1.0000 mg | PEN_INJECTOR | SUBCUTANEOUS | 5 refills | Status: DC
Start: 2023-11-07 — End: 2023-11-14
  Filled 2023-11-07: qty 3, 28d supply, fill #0

## 2023-11-07 NOTE — Assessment & Plan Note (Signed)
  Check labs On  B12

## 2023-11-07 NOTE — Assessment & Plan Note (Signed)
We discussed age appropriate health related issues, including available/recomended screening tests and vaccinations. We discussed a need for adhering to healthy diet and exercise. Labs were ordered to be later reviewed . All questions were answered. ?Colon 08/2016 ?A cardiac CT scan for calcium scoring offered in the past ?

## 2023-11-07 NOTE — Assessment & Plan Note (Signed)
Labs w/Dr Elvera Lennox

## 2023-11-07 NOTE — Progress Notes (Signed)
 Subjective:  Patient ID: Katelyn Peterson, female    DOB: [birth year]  Age: 78 y.o. MRN: [id number]  CC: Annual Exam   HPI Katelyn Peterson presents for a well exam  Outpatient Medications Prior to Visit  Medication Sig Dispense Refill   ascorbic acid (VITAMIN C) 500 MG tablet Take 500 mg by mouth daily.     atorvastatin  (LIPITOR) 20 MG tablet Take 1 tablet (20 mg total) by mouth daily. 90 tablet 3   Cholecalciferol (VITAMIN D3) 250 MCG (10000 UT) capsule Take 10,000 Units by mouth daily.     Cyanocobalamin  (B-12 COMPLIANCE INJECTION IJ) Inject 1 Dose as directed every 14 (fourteen) days.     empagliflozin  (JARDIANCE ) 10 MG TABS tablet Take 1 tablet (10 mg total) by mouth daily before breakfast. 90 tablet 3   Iron , Ferrous Sulfate , 325 (65 Fe) MG TABS Take 1 tablet by mouth daily. 30 tablet 3   lisinopril  (ZESTRIL ) 40 MG tablet Take 1 tablet (40 mg total) by mouth daily. 90 tablet 3   methocarbamol  (ROBAXIN ) 500 MG tablet Take 1 tablet (500 mg total) by mouth every 6 (six) hours as needed for muscle spasms. 40 tablet 0   omeprazole  (PRILOSEC) 20 MG capsule Take 1 capsule (20 mg total) by mouth 2 (two) times daily for 14 days, THEN 1 capsule (20 mg total) daily for 14 days, THEN 1 capsule (20 mg total) daily. (Patient taking differently: Take 20 mg by mouth once daily) 72 capsule 0   omeprazole  (PRILOSEC) 20 MG capsule Take 1 capsule (20 mg total) by mouth daily. 90 capsule 3   ondansetron  (ZOFRAN ) 4 MG tablet Take 1 tablet (4 mg total) by mouth every 6 (six) hours as needed for nausea. 20 tablet 0   spironolactone  (ALDACTONE ) 50 MG tablet Take 1 tablet (50 mg total) by mouth daily. 90 tablet 3   traMADol  (ULTRAM ) 50 MG tablet Take 1 tablet (50 mg total) by mouth every 6 (six) hours as needed. 100 tablet 2   verapamil  (VERELAN ) 240 MG 24 hr capsule Take 1 capsule (240 mg total) by mouth at bedtime. 90 capsule 3   Semaglutide ,0.25 or 0.5MG /DOS, (OZEMPIC , 0.25 OR 0.5 MG/DOSE,) 2 MG/3ML SOPN  Inject 0.5 mg into the skin once a week. 9 mL 3   oxyCODONE  (OXY IR/ROXICODONE ) 5 MG immediate release tablet Take 1-2 tablets (5-10 mg total) by mouth every 6 (six) hours as needed for severe pain (pain score 7-10). (Patient not taking: Reported on 11/07/2023) 42 tablet 0   No facility-administered medications prior to visit.    ROS: Review of Systems  Constitutional:  Positive for unexpected weight change. Negative for activity change, appetite change, chills, diaphoresis, fatigue and fever.  HENT:  Negative for congestion, dental problem, ear pain, hearing loss, mouth sores, postnasal drip, sinus pressure, sneezing, sore throat and voice change.   Eyes:  Negative for pain and visual disturbance.  Respiratory:  Negative for cough, chest tightness, wheezing and stridor.   Cardiovascular:  Negative for chest pain, palpitations and leg swelling.  Gastrointestinal:  Negative for abdominal distention, abdominal pain, blood in stool, nausea, rectal pain and vomiting.  Genitourinary:  Negative for decreased urine volume, difficulty urinating, dysuria, frequency, hematuria, menstrual problem, vaginal bleeding, vaginal discharge and vaginal pain.  Musculoskeletal:  Negative for arthralgias, back pain, gait problem, joint swelling and neck pain.  Skin:  Negative for color change, pallor, rash and wound.  Neurological:  Negative for dizziness, tremors, syncope, speech difficulty, weakness, light-headedness,  numbness and headaches.  Hematological:  Negative for adenopathy.  Psychiatric/Behavioral:  Negative for behavioral problems, confusion, decreased concentration, dysphoric mood, hallucinations, sleep disturbance and suicidal ideas. The patient is not nervous/anxious and is not hyperactive.     Objective:  BP 120/74   Pulse 80   Temp 97.8 F (36.6 C) (Oral)   Ht 5' 4.5 (1.638 m)   Wt 208 lb (94.3 kg)   SpO2 98%   BMI 35.15 kg/m   BP Readings from Last 3 Encounters:  11/07/23 120/74   07/26/23 120/72  05/14/23 120/70    Wt Readings from Last 3 Encounters:  11/07/23 208 lb (94.3 kg)  07/26/23 203 lb 6.4 oz (92.3 kg)  05/14/23 188 lb 6.4 oz (85.5 kg)    Physical Exam Constitutional:      General: She is not in acute distress.    Appearance: She is well-developed. She is obese.  HENT:     Head: Normocephalic.     Right Ear: External ear normal.     Left Ear: External ear normal.     Nose: Nose normal.  Eyes:     General:        Right eye: No discharge.        Left eye: No discharge.     Conjunctiva/sclera: Conjunctivae normal.     Pupils: Pupils are equal, round, and reactive to light.  Neck:     Thyroid : No thyromegaly.     Vascular: No JVD.     Trachea: No tracheal deviation.  Cardiovascular:     Rate and Rhythm: Normal rate and regular rhythm.     Heart sounds: Normal heart sounds.  Pulmonary:     Effort: No respiratory distress.     Breath sounds: No stridor. No wheezing.  Abdominal:     General: Bowel sounds are normal. There is no distension.     Palpations: Abdomen is soft. There is no mass.     Tenderness: There is no abdominal tenderness. There is no guarding or rebound.  Musculoskeletal:        General: No tenderness.     Cervical back: Normal range of motion and neck supple. No rigidity.  Lymphadenopathy:     Cervical: No cervical adenopathy.  Skin:    Findings: No erythema or rash.  Neurological:     Cranial Nerves: No cranial nerve deficit.     Motor: No abnormal muscle tone.     Coordination: Coordination normal.     Deep Tendon Reflexes: Reflexes normal.  Psychiatric:        Behavior: Behavior normal.        Thought Content: Thought content normal.        Judgment: Judgment normal.     Lab Results  Component Value Date   WBC 4.7 07/26/2023   HGB 10.0 (L) 07/26/2023   HCT 31.0 (L) 07/26/2023   PLT 271.0 07/26/2023   GLUCOSE 102 (H) 07/26/2023   CHOL 155 12/18/2021   TRIG 62.0 12/18/2021   HDL 56.80 12/18/2021    LDLDIRECT 146.5 09/18/2012   LDLCALC 86 12/18/2021   ALT 15 07/26/2023   AST 13 07/26/2023   NA 141 07/26/2023   K 4.6 07/26/2023   CL 107 07/26/2023   CREATININE 1.19 07/26/2023   BUN 25 (H) 07/26/2023   CO2 27 07/26/2023   TSH 1.75 12/18/2021   HGBA1C 6.1 (A) 05/14/2023    No results found.  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 We discussed a need for adhering to healthy diet and exercise. Labs were ordered to be later reviewed . All questions were answered. Colon 08/2016 A cardiac CT scan for calcium  scoring offered in the past      Relevant Orders   TSH   Urinalysis   CBC with Differential/Platelet   Comprehensive metabolic panel with GFR   Microalbumin / creatinine urine ratio      Meds ordered this encounter  Medications   Semaglutide , 1 MG/DOSE, 4 MG/3ML SOPN    Sig: Inject 1 mg as directed once a week.    Dispense:  3 mL    Refill:  5      Follow-up: Return in about 6 months (around 05/09/2024) for a follow-up visit.  Marolyn Noel, MD

## 2023-11-14 ENCOUNTER — Ambulatory Visit (INDEPENDENT_AMBULATORY_CARE_PROVIDER_SITE_OTHER): Admitting: Internal Medicine

## 2023-11-14 ENCOUNTER — Encounter: Payer: Self-pay | Admitting: Internal Medicine

## 2023-11-14 ENCOUNTER — Other Ambulatory Visit (HOSPITAL_COMMUNITY): Payer: Self-pay

## 2023-11-14 VITALS — BP 142/70 | HR 81 | Ht 64.5 in | Wt 209.0 lb

## 2023-11-14 DIAGNOSIS — E66812 Obesity, class 2: Secondary | ICD-10-CM

## 2023-11-14 DIAGNOSIS — Z7984 Long term (current) use of oral hypoglycemic drugs: Secondary | ICD-10-CM

## 2023-11-14 DIAGNOSIS — Z6835 Body mass index (BMI) 35.0-35.9, adult: Secondary | ICD-10-CM

## 2023-11-14 DIAGNOSIS — E113592 Type 2 diabetes mellitus with proliferative diabetic retinopathy without macular edema, left eye: Secondary | ICD-10-CM | POA: Diagnosis not present

## 2023-11-14 DIAGNOSIS — E1169 Type 2 diabetes mellitus with other specified complication: Secondary | ICD-10-CM | POA: Diagnosis not present

## 2023-11-14 DIAGNOSIS — E785 Hyperlipidemia, unspecified: Secondary | ICD-10-CM

## 2023-11-14 DIAGNOSIS — Z7985 Long-term (current) use of injectable non-insulin antidiabetic drugs: Secondary | ICD-10-CM

## 2023-11-14 MED ORDER — SEMAGLUTIDE (1 MG/DOSE) 4 MG/3ML ~~LOC~~ SOPN
1.0000 mg | PEN_INJECTOR | SUBCUTANEOUS | 3 refills | Status: AC
  Filled 2023-11-14: qty 9, 84d supply, fill #0
  Filled 2024-01-22: qty 3, 28d supply, fill #0
  Filled 2024-01-22: qty 9, 84d supply, fill #0
  Filled 2024-02-19: qty 3, 28d supply, fill #0

## 2023-11-14 NOTE — Patient Instructions (Addendum)
 Please continue: - Jardiance  10 mg in am  Please increase: - Ozempic  1 mg weekly  Please come back for a follow-up appointment in 6 months.

## 2023-11-14 NOTE — Progress Notes (Signed)
 Patient ID: Katelyn Peterson, female   DOB: March 22, 1945, 78 y.o.   MRN: [id number]  HPI: Katelyn Peterson is a 78 y.o.-year-old female, returning for f/u for DM2, dx 2000, non-insulin -dependent, controlled, with complications (diabetic retinopathy). Last visit 6 months ago.  Interim history: No increased urination, blurry vision, nausea, chest pain. She had right knee arthroplasty in 03/2023. She was still out of work at last visit and doing PT. She has no pain. She returned to work in 05/2023. She gained 20 pounds since last visit.  Reviewed HbA1c levels: 10/02/2023: HbA1c 6.1% Lab Results  Component Value Date   HGBA1C 6.1 (A) 05/14/2023   HGBA1C 6.0 (H) 03/12/2023   HGBA1C 6.2 09/19/2022  10/2022: HbA1c reportedly 5.6%  Pt is on a regimen of: - Metformin  1000 mg twice a day >> once a day >> stopped 03/2023 around the time of the surgery - Jardiance  10 mg daily in am - Januvia  100 mg daily in am >> Ozempic  0.5 >> 1 mg weekly 10/2021 We stopped Amaryl  2 mg bid and Actos  30 mg daily for daily hypoglycemia events and possible SEs, respectively.  Pt checks her sugars once a day: - am:  87-100 >> 78, 87-106, 124 >> 79-102, 110 >> 70-110, 120 - 2h after b'fast: n/c >> 131 >> n/c >> 114 >> n/c - before lunch: n/c >> 93 >> 100-110 >> n/c >> 98, 102 - 2h after lunch:  110, 121 >> n/c >> 93-99 >> n/c - before dinner: 105, 115 >> n/c >> 102 >> n/c >> 101-110 - 2h after dinner: 135, 135 >> n/c >> 96-101 >> n/c - bedtime:   90 >> 70 >> n/c Lowest sugar was 78 >> 79 >> 70;   she has hypoglycemia awareness in the 60s. Highest sugar was 124 x1 >> 110 >> 120.  -No CKD, last BUN/creatinine:  Lab Results  Component Value Date   BUN 25 (H) 07/26/2023   CREATININE 1.19 07/26/2023  On quinapril  40.  No results found for: MICRALBCREAT  -+ HL; last set of lipids: Lab Results  Component Value Date   CHOL 155 12/18/2021   HDL 56.80 12/18/2021   LDLCALC 86 12/18/2021   LDLDIRECT 146.5  09/18/2012   TRIG 62.0 12/18/2021   CHOLHDL 3 12/18/2021  She had leg cramps with statins: Rosuvastatin , pravastatin , atorvastatin  and fluvastatin  XL.  She refused to try Zetia.  Now on Lipitor low-dose every other day (or sometimes 2x a week) and she uses a muscle rub cream for cramps.  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 Lucina.  - She denies numbness and tingling in her feet.  Last foot exam 05/14/2023.  She has a history of HTN. She has been considered for lap band surgery >> she did not want to follow through with this. She also has bilateral chronic lower extremity cramps, right knee pain, anemia.  Latest TSH was normal: Lab Results  Component Value Date   TSH 1.75 12/18/2021   ROS: + see HPI  I reviewed pt's medications, allergies, PMH, social hx, family hx, and changes were documented in the history of present illness. Otherwise, unchanged from my initial visit note.  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   Procedure: TOTAL KNEE ARTHROPLASTY;  Surgeon: Katelyn Lerner, MD;  Location: [facility];  Service: Orthopedics;  Laterality: Right;   Social History   Socioeconomic History   Marital status: Married    Spouse name: Not on file   Number of children: Not on file   Years of education: Not on file   Highest education level: Not on file  Occupational History   Occupation:  [employer]  Tobacco Use   Smoking status: Never   Smokeless tobacco: Never  Vaping Use   Vaping status: Never Used  Substance and Sexual Activity   Alcohol use: No   Drug  use: No   Sexual activity: Yes    Partners: Male  Other Topics Concern   Not on file  Social History Narrative   Regular exercise: seldom   Caffeine use: occasionally         Social Drivers of Health   Financial Resource Strain: Not on file  Food Insecurity: Patient Declined (03/18/2023)   Hunger Vital Sign    Worried About Running Out of Food in the Last Year: Patient declined    Ran Out of Food in the Last Year: Patient declined  Transportation Needs: No Transportation Needs (03/18/2023)   PRAPARE - Administrator, Civil Service (Medical): No    Lack of Transportation (Non-Medical): No  Physical Activity: Not on file  Stress: Not on file  Social Connections: Patient Declined (03/18/2023)   Social Connection and Isolation Panel    Frequency of Communication with Friends and Family: Patient declined    Frequency of Social Gatherings with Friends and Family: Patient declined    Attends Religious Services: Patient declined    Database administrator or Organizations: Patient declined    Attends Banker Meetings: Patient declined    Marital Status: Patient declined  Intimate Partner Violence: Patient Declined (03/18/2023)   Humiliation, Afraid, Rape, and Kick questionnaire    Fear of Current or Ex-Partner: Patient declined    Emotionally Abused: Patient declined    Physically Abused: Patient declined    Sexually Abused: Patient declined   Current Outpatient Medications on File Prior to Visit  Medication Sig Dispense Refill   ascorbic acid (VITAMIN C) 500 MG tablet Take 500 mg by mouth daily.     atorvastatin  (LIPITOR) 20 MG tablet Take 1 tablet (20 mg total) by mouth daily. 90 tablet 3   Cholecalciferol (VITAMIN D3) 250 MCG (10000 UT) capsule Take 10,000 Units by mouth daily.     Cyanocobalamin  (B-12 COMPLIANCE INJECTION IJ) Inject 1 Dose as directed every 14 (fourteen) days.     empagliflozin  (JARDIANCE ) 10 MG TABS tablet Take 1 tablet (10 mg total) by mouth  daily before breakfast. 90 tablet 3   Iron , Ferrous Sulfate , 325 (65 Fe) MG TABS Take 1 tablet by mouth daily. 30 tablet 3   lisinopril  (ZESTRIL ) 40 MG tablet Take 1 tablet (40 mg total) by mouth daily. 90 tablet 3   methocarbamol  (ROBAXIN ) 500 MG tablet Take 1 tablet (500 mg total) by mouth every 6 (six) hours as needed for muscle spasms. 40 tablet 0   omeprazole  (PRILOSEC) 20 MG capsule Take 1 capsule (20 mg total) by mouth 2 (two) times daily for 14 days, THEN 1 capsule (20 mg total) daily for 14 days,  THEN 1 capsule (20 mg total) daily. (Patient taking differently: Take 20 mg by mouth once daily) 72 capsule 0   omeprazole  (PRILOSEC) 20 MG capsule Take 1 capsule (20 mg total) by mouth daily. 90 capsule 3   ondansetron  (ZOFRAN ) 4 MG tablet Take 1 tablet (4 mg total) by mouth every 6 (six) hours as needed for nausea. 20 tablet 0   oxyCODONE  (OXY IR/ROXICODONE ) 5 MG immediate release tablet Take 1-2 tablets (5-10 mg total) by mouth every 6 (six) hours as needed for severe pain (pain score 7-10). (Patient not taking: Reported on 11/07/2023) 42 tablet 0   Semaglutide , 1 MG/DOSE, 4 MG/3ML SOPN Inject 1 mg as directed once a week. 3 mL 5   spironolactone  (ALDACTONE ) 50 MG tablet Take 1 tablet (50 mg total) by mouth daily. 90 tablet 3   traMADol  (ULTRAM ) 50 MG tablet Take 1 tablet (50 mg total) by mouth every 6 (six) hours as needed. 100 tablet 2   verapamil  (VERELAN ) 240 MG 24 hr capsule Take 1 capsule (240 mg total) by mouth at bedtime. 90 capsule 3   No current facility-administered medications on file prior to visit.   Allergies  Allergen Reactions   Atorvastatin      cramps   Hydrochlorothiazide     Cramps   Pravastatin      cramps   Family History  Problem Relation Age of Onset   Hypertension Mother    Diabetes Mother    Hypertension Father    Hypertension Other    Colon cancer Neg Hx    Esophageal cancer Neg Hx    Rectal cancer Neg Hx    Stomach cancer Neg Hx    PE: BP (!) 142/70    Pulse 81   Ht 5' 4.5 (1.638 m)   Wt 209 lb (94.8 kg)   SpO2 96%   BMI 35.32 kg/m    Wt Readings from Last 10 Encounters:  11/14/23 209 lb (94.8 kg)  11/07/23 208 lb (94.3 kg)  07/26/23 203 lb 6.4 oz (92.3 kg)  05/14/23 188 lb 6.4 oz (85.5 kg)  03/12/23 194 lb (88 kg)  12/20/22 193 lb (87.5 kg)  11/13/22 184 lb (83.5 kg)  10/05/22 186 lb (84.4 kg)  10/01/22 186 lb (84.4 kg)  09/19/22 184 lb (83.5 kg)   Constitutional: overweight, in NAD Eyes: EOMI, no exophthalmos ENT:  no thyromegaly, no cervical lymphadenopathy Cardiovascular: RRR, No RG, + 1/6 SEM, + mild peri ankle swelling bilaterally, pitting Respiratory: CTA B Musculoskeletal: no deformities Skin:  no rashes  Neurological: + tremor with outstretched hands  Assessment: 1. DM2, non-insulin -dependent, controlled, with complications - DR  2. Hyperlipidemia - Developed leg cramps with pravastatin , atorvastatin , Crestor , fluvastatin   3.  Obesity class II  4. Goiter by palpation 11/04/2012: Thyroid  U/S: Right thyroid  lobe: 4.3 x 1.0 x 1.8 cm  Left thyroid  lobe: 4.6 x 1.2 x 1.9 cm  Isthmus: 5 mm  Focal nodules: Thyroid  echotexture is homogeneous, without solid or cystic lesion.  Lymphadenopathy: None visualized.  IMPRESSION:  Normal thyroid  ultrasound. No evidence of thyromegaly or dominant solid/cystic mass.  -No further intervention needed for this  PLAN:  1. DM2 -Patient with longstanding, well-controlled, type 2 diabetes, on SGLT2 inhibitor and weekly GLP-1 receptor agonist, changed from a DPP 4 inhibitor.  HbA1c improved after starting Ozempic  and he was 6.1 at last visit, slightly higher, but still well within the target range.  Sugars were at goal in the morning and she also had some blood  sugar checks after dinner and they were excellent.  Her metformin  was stopped before her total knee replacement and she did not restart it.  I did not recommend to add it back at last visit. - Reviewing outside records,  she had an HbA1c of 6.1% approximately 1 month ago.  We will not repeat this today. - At today's visit, sugars remain excellent, so I would not necessarily recommend a change in regimen, however, with her weight gain of 20 pounds since last visit, we did discuss about increasing the dose of Ozempic  from 0.5 mg to 1 mg weekly.  She tells me that this was also recommended by PCP but she did not start the higher dose yet.  I sent a new prescription for Ozempic  to her pharmacy.  Will continue Jardiance  for now. - I suggested to:  Patient Instructions  Please continue: - Jardiance  10 mg in am  Please increase: - Ozempic  1 mg weekly  Please come back for a follow-up appointment in 6 months.  - advised to check sugars at different times of the day - 1x a day, rotating check times - advised for yearly eye exams >> she is UTD - will check an ACR today - return to clinic in 6 months  2. Hyperlipidemia - Latest lipid panel was reviewed from 2023: LDL slightly above target, otherwise fractions at goal Lab Results  Component Value Date   CHOL 155 12/18/2021   HDL 56.80 12/18/2021   LDLCALC 86 12/18/2021   LDLDIRECT 146.5 09/18/2012   TRIG 62.0 12/18/2021   CHOLHDL 3 12/18/2021  -She had leg cramps from low-dose Crestor , pravastatin , atorvastatin , fluvastatin  XL.  However, she is now able to tolerate Lipitor 20 mg daily. - She has a lipid panel pending by PCP  3.  Obesity class II - Will continue Jardiance  and Ozempic  which should also help with weight loss - She previously lost 17 pounds after starting Ozempic . - Gained 4 pounds before last visit possibly due to being less active in the setting of knee replacement - Liver, she gained 21 pounds since last visit!  Will increase her Ozempic  dose at today's visit  Lela Fendt, MD PhD [facility]

## 2023-11-15 ENCOUNTER — Ambulatory Visit: Payer: Self-pay | Admitting: Internal Medicine

## 2023-11-15 LAB — MICROALBUMIN / CREATININE URINE RATIO
Creatinine, Urine: 40 mg/dL (ref 20–275)
Microalb Creat Ratio: 23 mg/g{creat} (ref <30)
Microalb, Ur: 0.9 mg/dL

## 2023-11-26 ENCOUNTER — Ambulatory Visit: Admitting: Internal Medicine

## 2023-11-28 DIAGNOSIS — Z1231 Encounter for screening mammogram for malignant neoplasm of breast: Secondary | ICD-10-CM | POA: Diagnosis not present

## 2023-11-28 LAB — HM MAMMOGRAPHY

## 2023-12-12 ENCOUNTER — Encounter: Payer: Self-pay | Admitting: Internal Medicine

## 2023-12-17 DIAGNOSIS — Z961 Presence of intraocular lens: Secondary | ICD-10-CM | POA: Diagnosis not present

## 2023-12-17 DIAGNOSIS — H35372 Puckering of macula, left eye: Secondary | ICD-10-CM | POA: Diagnosis not present

## 2023-12-17 DIAGNOSIS — H33311 Horseshoe tear of retina without detachment, right eye: Secondary | ICD-10-CM | POA: Diagnosis not present

## 2024-01-14 DIAGNOSIS — H35372 Puckering of macula, left eye: Secondary | ICD-10-CM | POA: Diagnosis not present

## 2024-01-14 DIAGNOSIS — H3589 Other specified retinal disorders: Secondary | ICD-10-CM | POA: Diagnosis not present

## 2024-01-14 DIAGNOSIS — H33311 Horseshoe tear of retina without detachment, right eye: Secondary | ICD-10-CM | POA: Diagnosis not present

## 2024-01-22 ENCOUNTER — Other Ambulatory Visit: Payer: Self-pay | Admitting: Gastroenterology

## 2024-01-22 ENCOUNTER — Telehealth: Payer: Self-pay

## 2024-01-22 ENCOUNTER — Other Ambulatory Visit: Payer: Self-pay

## 2024-01-22 ENCOUNTER — Telehealth (HOSPITAL_COMMUNITY): Payer: Self-pay | Admitting: Pharmacy Technician

## 2024-01-22 ENCOUNTER — Other Ambulatory Visit (HOSPITAL_COMMUNITY): Payer: Self-pay

## 2024-01-22 MED ORDER — OMEPRAZOLE 20 MG PO CPDR
20.0000 mg | DELAYED_RELEASE_CAPSULE | Freq: Every day | ORAL | 0 refills | Status: AC
  Filled 2024-01-22: qty 90, 90d supply, fill #0

## 2024-01-22 NOTE — Telephone Encounter (Signed)
 Pharmacy Patient Advocate Encounter   Received notification from Patient Advice Request messages that prior authorization for Ozempic  4mg /76ml  is required/requested.   Insurance verification completed.   The patient is insured through [facility].   Per test claim: The current 30 day co-pay is, $0.  No PA needed at this time. This test claim was processed through [facility]- copay amounts may vary at other pharmacies due to pharmacy/plan contracts, or as the patient moves through the different stages of their insurance plan.     Insurance will only cover 30 days per fill

## 2024-01-23 NOTE — Telephone Encounter (Signed)
 I called and spoke with the patient she states that she needs a 90 day supply and would like another PA sent in for a 90 day vs 30. I did advise her that the insurance only covered 30 days.

## 2024-01-23 NOTE — Telephone Encounter (Signed)
 We can not complete a PA for day supply. They will only cover 30 at a time.

## 2024-01-27 NOTE — Telephone Encounter (Signed)
 I called and spoke with the patient letting her know that they can not submit request for a certain amount of days and I also reiterated that the insurance only covers 30 day supply.

## 2024-02-04 ENCOUNTER — Other Ambulatory Visit (HOSPITAL_COMMUNITY): Payer: Self-pay

## 2024-02-12 DIAGNOSIS — H52203 Unspecified astigmatism, bilateral: Secondary | ICD-10-CM | POA: Diagnosis not present

## 2024-02-12 DIAGNOSIS — H3561 Retinal hemorrhage, right eye: Secondary | ICD-10-CM | POA: Diagnosis not present

## 2024-02-12 DIAGNOSIS — Z961 Presence of intraocular lens: Secondary | ICD-10-CM | POA: Diagnosis not present

## 2024-02-12 DIAGNOSIS — H5203 Hypermetropia, bilateral: Secondary | ICD-10-CM | POA: Diagnosis not present

## 2024-02-12 DIAGNOSIS — H43811 Vitreous degeneration, right eye: Secondary | ICD-10-CM | POA: Diagnosis not present

## 2024-02-12 DIAGNOSIS — H524 Presbyopia: Secondary | ICD-10-CM | POA: Diagnosis not present

## 2024-02-12 DIAGNOSIS — E113211 Type 2 diabetes mellitus with mild nonproliferative diabetic retinopathy with macular edema, right eye: Secondary | ICD-10-CM | POA: Diagnosis not present

## 2024-02-12 DIAGNOSIS — E113292 Type 2 diabetes mellitus with mild nonproliferative diabetic retinopathy without macular edema, left eye: Secondary | ICD-10-CM | POA: Diagnosis not present

## 2024-02-19 ENCOUNTER — Other Ambulatory Visit (HOSPITAL_COMMUNITY): Payer: Self-pay

## 2024-03-02 ENCOUNTER — Other Ambulatory Visit: Payer: Self-pay | Admitting: Internal Medicine

## 2024-03-02 ENCOUNTER — Other Ambulatory Visit (HOSPITAL_COMMUNITY): Payer: Self-pay

## 2024-03-03 ENCOUNTER — Other Ambulatory Visit (HOSPITAL_COMMUNITY): Payer: Self-pay

## 2024-03-03 MED ORDER — TRAMADOL HCL 50 MG PO TABS
50.0000 mg | ORAL_TABLET | Freq: Four times a day (QID) | ORAL | 2 refills | Status: AC | PRN
  Filled 2024-03-03: qty 100, 25d supply, fill #0

## 2024-03-06 ENCOUNTER — Other Ambulatory Visit (HOSPITAL_BASED_OUTPATIENT_CLINIC_OR_DEPARTMENT_OTHER): Payer: Self-pay

## 2024-03-06 ENCOUNTER — Emergency Department (HOSPITAL_BASED_OUTPATIENT_CLINIC_OR_DEPARTMENT_OTHER)
Admission: EM | Admit: 2024-03-06 | Discharge: 2024-03-06 | Disposition: A | Discharge status: Home / Self Care | Attending: Emergency Medicine | Admitting: Emergency Medicine

## 2024-03-06 ENCOUNTER — Other Ambulatory Visit: Payer: Self-pay

## 2024-03-06 ENCOUNTER — Encounter (HOSPITAL_BASED_OUTPATIENT_CLINIC_OR_DEPARTMENT_OTHER): Payer: Self-pay

## 2024-03-06 DIAGNOSIS — Y92410 Unspecified street and highway as the place of occurrence of the external cause: Secondary | ICD-10-CM | POA: Diagnosis not present

## 2024-03-06 DIAGNOSIS — Z043 Encounter for examination and observation following other accident: Secondary | ICD-10-CM | POA: Insufficient documentation

## 2024-03-06 MED ORDER — CYCLOBENZAPRINE HCL 10 MG PO TABS
10.0000 mg | ORAL_TABLET | Freq: Two times a day (BID) | ORAL | 0 refills | Status: AC | PRN
  Filled 2024-03-06: qty 20, 10d supply, fill #0
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
## 2024-03-06 NOTE — Discharge Instructions (Signed)
 If you develop any muscle spasms I recommend using Flexeril .  This medication is sedating so please be careful with its use.  Otherwise I would use Tylenol  and ibuprofen  for pain if you are allowed to take this medicines.  Follow-up with primary care.

## 2024-03-06 NOTE — ED Notes (Signed)
 Reviewed discharge instructions, follow up and medications with pt. Pt states understanding. Ambulatory at discharge with family. No pain at discharge

## 2024-03-10 ENCOUNTER — Other Ambulatory Visit: Payer: Self-pay | Admitting: Internal Medicine

## 2024-03-10 DIAGNOSIS — M549 Dorsalgia, unspecified: Secondary | ICD-10-CM

## 2024-03-11 ENCOUNTER — Other Ambulatory Visit: Payer: Self-pay

## 2024-03-11 ENCOUNTER — Encounter: Payer: Self-pay | Admitting: Physical Therapy

## 2024-03-11 ENCOUNTER — Ambulatory Visit: Admitting: Physical Therapy

## 2024-03-11 DIAGNOSIS — M545 Low back pain, unspecified: Secondary | ICD-10-CM | POA: Insufficient documentation

## 2024-03-11 DIAGNOSIS — M6281 Muscle weakness (generalized): Secondary | ICD-10-CM | POA: Diagnosis present
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
## 2024-03-17 ENCOUNTER — Encounter: Payer: Self-pay | Admitting: Physical Therapy

## 2024-03-17 ENCOUNTER — Ambulatory Visit: Admitting: Physical Therapy

## 2024-03-17 ENCOUNTER — Ambulatory Visit: Attending: Internal Medicine | Admitting: Physical Therapy

## 2024-03-17 DIAGNOSIS — R262 Difficulty in walking, not elsewhere classified: Secondary | ICD-10-CM | POA: Insufficient documentation

## 2024-03-17 DIAGNOSIS — G8929 Other chronic pain: Secondary | ICD-10-CM | POA: Insufficient documentation

## 2024-03-17 DIAGNOSIS — M6281 Muscle weakness (generalized): Secondary | ICD-10-CM | POA: Diagnosis present

## 2024-03-17 DIAGNOSIS — M25561 Pain in right knee: Secondary | ICD-10-CM | POA: Diagnosis present

## 2024-03-17 DIAGNOSIS — R293 Abnormal posture: Secondary | ICD-10-CM | POA: Diagnosis present

## 2024-03-17 DIAGNOSIS — R252 Cramp and spasm: Secondary | ICD-10-CM | POA: Insufficient documentation

## 2024-03-17 DIAGNOSIS — M545 Low back pain, unspecified: Secondary | ICD-10-CM | POA: Insufficient documentation

## 2024-03-17 DIAGNOSIS — M25661 Stiffness of right knee, not elsewhere classified: Secondary | ICD-10-CM | POA: Diagnosis present
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
## 2024-03-19 ENCOUNTER — Ambulatory Visit: Admitting: Physical Therapy

## 2024-03-19 ENCOUNTER — Encounter: Payer: Self-pay | Admitting: Physical Therapy

## 2024-03-19 DIAGNOSIS — M545 Low back pain, unspecified: Secondary | ICD-10-CM

## 2024-03-19 DIAGNOSIS — M6281 Muscle weakness (generalized): Secondary | ICD-10-CM

## 2024-03-19 NOTE — Patient Instructions (Signed)
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
## 2024-04-01 ENCOUNTER — Ambulatory Visit

## 2024-04-01 DIAGNOSIS — M545 Low back pain, unspecified: Secondary | ICD-10-CM

## 2024-04-01 DIAGNOSIS — M25661 Stiffness of right knee, not elsewhere classified: Secondary | ICD-10-CM

## 2024-04-01 DIAGNOSIS — G8929 Other chronic pain: Secondary | ICD-10-CM

## 2024-04-01 DIAGNOSIS — R293 Abnormal posture: Secondary | ICD-10-CM

## 2024-04-01 DIAGNOSIS — R252 Cramp and spasm: Secondary | ICD-10-CM

## 2024-04-01 DIAGNOSIS — M6281 Muscle weakness (generalized): Secondary | ICD-10-CM
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
## 2024-04-07 ENCOUNTER — Ambulatory Visit

## 2024-04-07 DIAGNOSIS — M545 Low back pain, unspecified: Secondary | ICD-10-CM

## 2024-04-07 DIAGNOSIS — M6281 Muscle weakness (generalized): Secondary | ICD-10-CM

## 2024-04-07 DIAGNOSIS — R262 Difficulty in walking, not elsewhere classified: Secondary | ICD-10-CM

## 2024-04-07 DIAGNOSIS — R252 Cramp and spasm: Secondary | ICD-10-CM

## 2024-04-07 DIAGNOSIS — M25661 Stiffness of right knee, not elsewhere classified: Secondary | ICD-10-CM

## 2024-04-07 DIAGNOSIS — R293 Abnormal posture: Secondary | ICD-10-CM

## 2024-04-07 DIAGNOSIS — G8929 Other chronic pain: Secondary | ICD-10-CM
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
## 2024-04-09 ENCOUNTER — Ambulatory Visit: Admitting: Physical Therapy

## 2024-04-09 ENCOUNTER — Encounter: Payer: Self-pay | Admitting: Physical Therapy

## 2024-04-09 DIAGNOSIS — M545 Low back pain, unspecified: Secondary | ICD-10-CM | POA: Diagnosis not present

## 2024-04-09 DIAGNOSIS — M6281 Muscle weakness (generalized): Secondary | ICD-10-CM
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
## 2024-04-14 ENCOUNTER — Ambulatory Visit

## 2024-04-14 DIAGNOSIS — R252 Cramp and spasm: Secondary | ICD-10-CM

## 2024-04-14 DIAGNOSIS — M6281 Muscle weakness (generalized): Secondary | ICD-10-CM

## 2024-04-14 DIAGNOSIS — M545 Low back pain, unspecified: Secondary | ICD-10-CM

## 2024-04-14 DIAGNOSIS — R262 Difficulty in walking, not elsewhere classified: Secondary | ICD-10-CM

## 2024-04-14 DIAGNOSIS — R293 Abnormal posture: Secondary | ICD-10-CM

## 2024-04-14 DIAGNOSIS — M25661 Stiffness of right knee, not elsewhere classified: Secondary | ICD-10-CM

## 2024-04-14 DIAGNOSIS — G8929 Other chronic pain: Secondary | ICD-10-CM

## 2024-04-16 ENCOUNTER — Ambulatory Visit: Admitting: Physical Therapy

## 2024-04-16 ENCOUNTER — Encounter: Payer: Self-pay | Admitting: Physical Therapy

## 2024-04-16 DIAGNOSIS — M545 Low back pain, unspecified: Secondary | ICD-10-CM

## 2024-04-16 DIAGNOSIS — M6281 Muscle weakness (generalized): Secondary | ICD-10-CM
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
## 2024-04-22 ENCOUNTER — Ambulatory Visit

## 2024-04-28 ENCOUNTER — Ambulatory Visit: Admitting: Physical Therapy

## 2024-04-30 ENCOUNTER — Ambulatory Visit: Admitting: Physical Therapy

## 2024-05-05 ENCOUNTER — Ambulatory Visit: Admitting: Physical Therapy

## 2024-05-11 ENCOUNTER — Ambulatory Visit: Admitting: Internal Medicine

## 2024-05-12 ENCOUNTER — Ambulatory Visit: Admitting: Internal Medicine
